# Patient Record
Sex: Female | Born: 1965 | Race: White | Hispanic: No | Marital: Married | Smoking: Former smoker
Health system: Southern US, Community
[De-identification: ages and names within clinical notes are randomized; demographics above are authoritative.]

## PROBLEM LIST (undated history)

## (undated) DIAGNOSIS — N200 Calculus of kidney: Secondary | ICD-10-CM

## (undated) DIAGNOSIS — I10 Essential (primary) hypertension: Secondary | ICD-10-CM

## (undated) DIAGNOSIS — T8859XA Other complications of anesthesia, initial encounter: Secondary | ICD-10-CM

## (undated) DIAGNOSIS — D649 Anemia, unspecified: Secondary | ICD-10-CM

## (undated) DIAGNOSIS — Z87442 Personal history of urinary calculi: Secondary | ICD-10-CM

## (undated) DIAGNOSIS — K602 Anal fissure, unspecified: Secondary | ICD-10-CM

## (undated) DIAGNOSIS — K8020 Calculus of gallbladder without cholecystitis without obstruction: Secondary | ICD-10-CM

## (undated) DIAGNOSIS — K589 Irritable bowel syndrome without diarrhea: Secondary | ICD-10-CM

## (undated) DIAGNOSIS — K219 Gastro-esophageal reflux disease without esophagitis: Secondary | ICD-10-CM

## (undated) HISTORY — DX: Calculus of kidney: N20.0

## (undated) HISTORY — PX: CHOLECYSTECTOMY: SHX55

## (undated) HISTORY — DX: Calculus of gallbladder without cholecystitis without obstruction: K80.20

## (undated) HISTORY — DX: Irritable bowel syndrome without diarrhea: K58.9

## (undated) HISTORY — DX: Morbid (severe) obesity due to excess calories: E66.01

## (undated) HISTORY — DX: Anal fissure, unspecified: K60.2

## (undated) HISTORY — PX: TUBAL LIGATION: SHX77

---

## 1986-06-08 HISTORY — PX: CHOLECYSTECTOMY: SHX55

## 1988-06-08 HISTORY — PX: TUBAL LIGATION: SHX77

## 2000-10-13 ENCOUNTER — Emergency Department (HOSPITAL_COMMUNITY): Admission: EM | Admit: 2000-10-13 | Discharge: 2000-10-13 | Payer: Self-pay

## 2001-01-05 ENCOUNTER — Emergency Department (HOSPITAL_COMMUNITY): Admission: EM | Admit: 2001-01-05 | Discharge: 2001-01-05 | Payer: Self-pay | Admitting: Emergency Medicine

## 2001-01-05 ENCOUNTER — Encounter: Payer: Self-pay | Admitting: Emergency Medicine

## 2001-01-06 ENCOUNTER — Encounter: Payer: Self-pay | Admitting: Emergency Medicine

## 2001-01-06 ENCOUNTER — Emergency Department (HOSPITAL_COMMUNITY): Admission: EM | Admit: 2001-01-06 | Discharge: 2001-01-06 | Payer: Self-pay | Admitting: Emergency Medicine

## 2001-01-18 ENCOUNTER — Encounter: Admission: RE | Admit: 2001-01-18 | Discharge: 2001-04-18 | Payer: Self-pay | Admitting: Family Medicine

## 2001-02-06 ENCOUNTER — Encounter: Payer: Self-pay | Admitting: Family Medicine

## 2001-02-22 ENCOUNTER — Other Ambulatory Visit: Admission: RE | Admit: 2001-02-22 | Discharge: 2001-02-22 | Payer: Self-pay | Admitting: Family Medicine

## 2001-05-29 ENCOUNTER — Emergency Department (HOSPITAL_COMMUNITY): Admission: EM | Admit: 2001-05-29 | Discharge: 2001-05-29 | Payer: Self-pay | Admitting: Emergency Medicine

## 2004-02-07 ENCOUNTER — Encounter: Payer: Self-pay | Admitting: Family Medicine

## 2004-02-07 LAB — CONVERTED CEMR LAB: Pap Smear: NORMAL

## 2004-02-20 ENCOUNTER — Other Ambulatory Visit: Admission: RE | Admit: 2004-02-20 | Discharge: 2004-02-20 | Payer: Self-pay | Admitting: Family Medicine

## 2004-10-08 ENCOUNTER — Ambulatory Visit: Payer: Self-pay | Admitting: Family Medicine

## 2005-06-17 ENCOUNTER — Ambulatory Visit: Payer: Self-pay | Admitting: Family Medicine

## 2005-10-30 ENCOUNTER — Ambulatory Visit: Payer: Self-pay | Admitting: Family Medicine

## 2006-09-16 ENCOUNTER — Ambulatory Visit: Payer: Self-pay | Admitting: Family Medicine

## 2006-09-30 ENCOUNTER — Ambulatory Visit: Payer: Self-pay | Admitting: Family Medicine

## 2006-10-11 ENCOUNTER — Telehealth (INDEPENDENT_AMBULATORY_CARE_PROVIDER_SITE_OTHER): Payer: Self-pay | Admitting: *Deleted

## 2006-10-27 ENCOUNTER — Encounter: Payer: Self-pay | Admitting: Family Medicine

## 2006-10-27 DIAGNOSIS — K589 Irritable bowel syndrome without diarrhea: Secondary | ICD-10-CM

## 2006-10-27 DIAGNOSIS — E785 Hyperlipidemia, unspecified: Secondary | ICD-10-CM | POA: Insufficient documentation

## 2006-10-27 DIAGNOSIS — I10 Essential (primary) hypertension: Secondary | ICD-10-CM

## 2006-10-27 DIAGNOSIS — K219 Gastro-esophageal reflux disease without esophagitis: Secondary | ICD-10-CM

## 2006-10-27 DIAGNOSIS — E669 Obesity, unspecified: Secondary | ICD-10-CM

## 2006-10-29 ENCOUNTER — Other Ambulatory Visit: Admission: RE | Admit: 2006-10-29 | Discharge: 2006-10-29 | Payer: Self-pay | Admitting: Family Medicine

## 2006-10-29 ENCOUNTER — Encounter: Payer: Self-pay | Admitting: Family Medicine

## 2006-10-29 ENCOUNTER — Ambulatory Visit: Payer: Self-pay | Admitting: Family Medicine

## 2006-10-29 DIAGNOSIS — N939 Abnormal uterine and vaginal bleeding, unspecified: Secondary | ICD-10-CM

## 2006-10-29 LAB — CONVERTED CEMR LAB
AST: 25 units/L (ref 0–37)
Albumin: 3.9 g/dL (ref 3.5–5.2)
Basophils Absolute: 0 10*3/uL (ref 0.0–0.1)
Chloride: 109 meq/L (ref 96–112)
Cholesterol: 168 mg/dL (ref 0–200)
Eosinophils Absolute: 0.2 10*3/uL (ref 0.0–0.6)
GFR calc Af Amer: 120 mL/min
GFR calc non Af Amer: 99 mL/min
HCT: 35.3 % — ABNORMAL LOW (ref 36.0–46.0)
MCHC: 35.5 g/dL (ref 30.0–36.0)
MCV: 93 fL (ref 78.0–100.0)
Neutrophils Relative %: 56.9 % (ref 43.0–77.0)
Platelets: 251 10*3/uL (ref 150–400)
RBC: 3.8 M/uL — ABNORMAL LOW (ref 3.87–5.11)
Sodium: 141 meq/L (ref 135–145)
Total CHOL/HDL Ratio: 4.3
Triglycerides: 101 mg/dL (ref 0–149)

## 2006-11-03 ENCOUNTER — Encounter (INDEPENDENT_AMBULATORY_CARE_PROVIDER_SITE_OTHER): Payer: Self-pay | Admitting: *Deleted

## 2006-11-19 ENCOUNTER — Encounter: Admission: RE | Admit: 2006-11-19 | Discharge: 2006-11-19 | Payer: Self-pay | Admitting: Family Medicine

## 2006-11-19 IMAGING — US US TRANSVAGINAL NON-OB
1 series · 14 of 25 positions shown · non-contrast
Comparison: none

CLINICAL DATA: Menorrhagia.  Severe dysmenorrhea. 
 TRANSABDOMINAL AND TRANSVAGINAL PELVIC ULTRASOUND:
TECHNIQUE: Both transabdominal and transvaginal ultrasound examinations of the pelvis were performed including evaluation of the uterus, ovaries, adnexal regions, and pelvic cul-de-sac.

[Series 1: us transvaginal non-ob · 0.35mm/px · 14 of 72 slices shown]
[im 1/72]
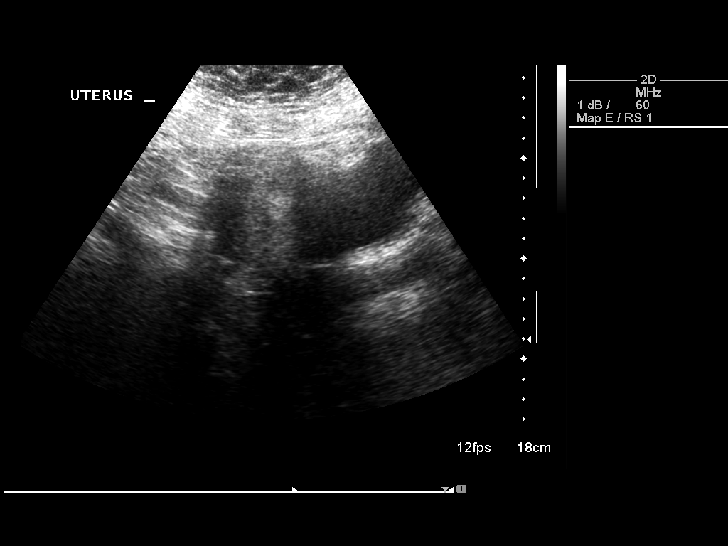
[im 6/72]
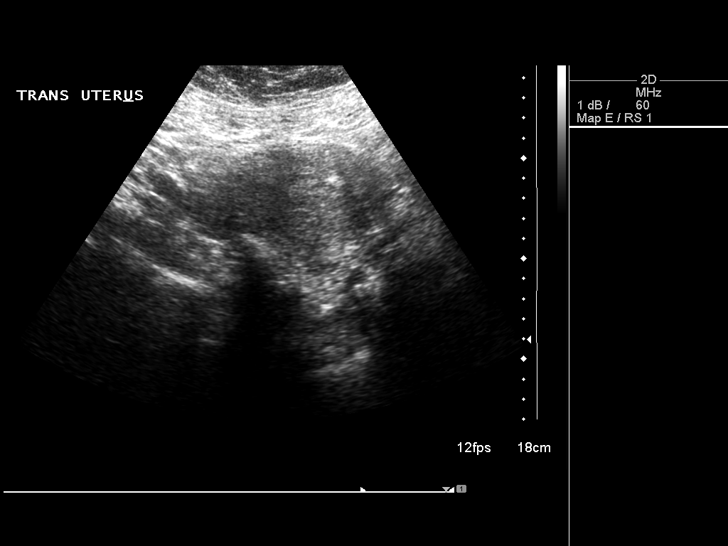
[im 12/72]
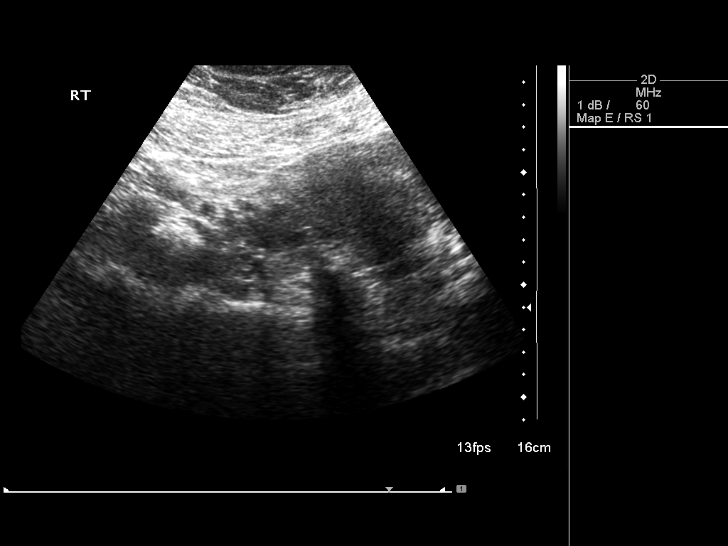
[im 18/72]
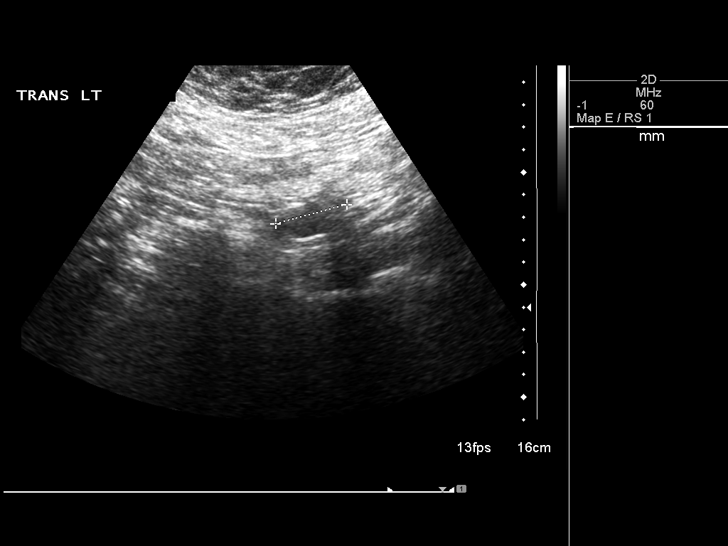
[im 24/72]
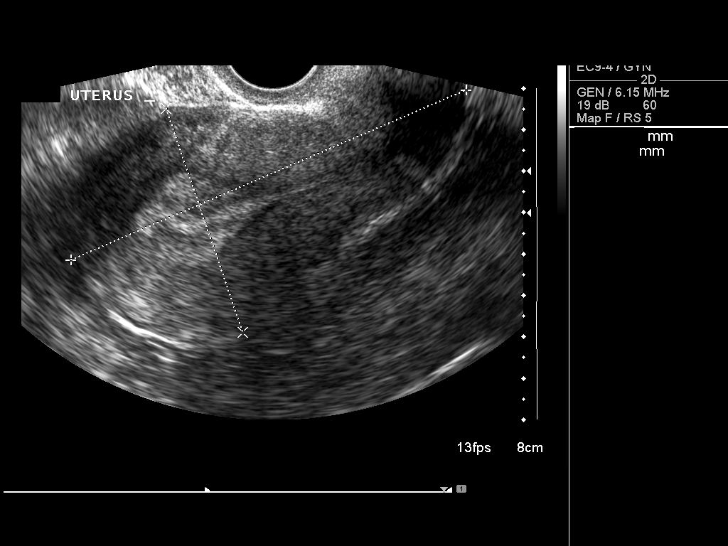
[im 27/72]
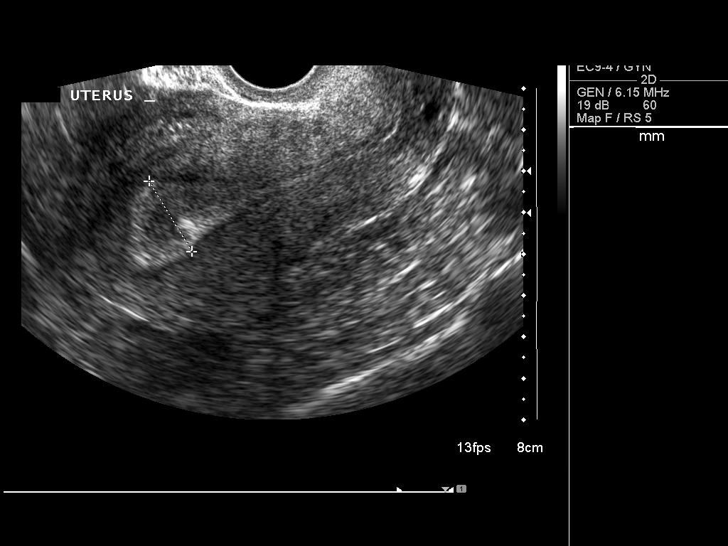
[im 33/72]
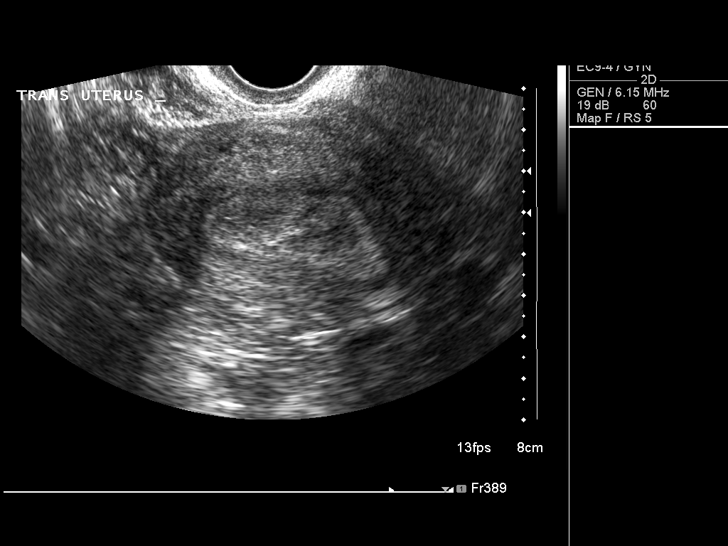
[im 39/72]
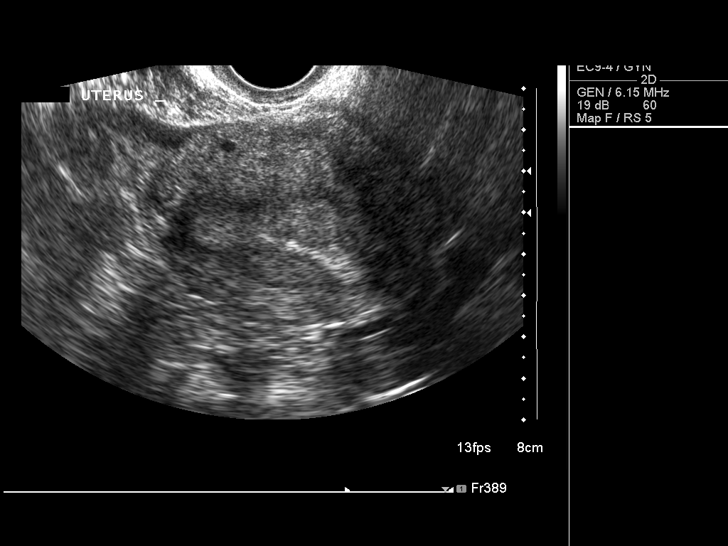
[im 45/72]
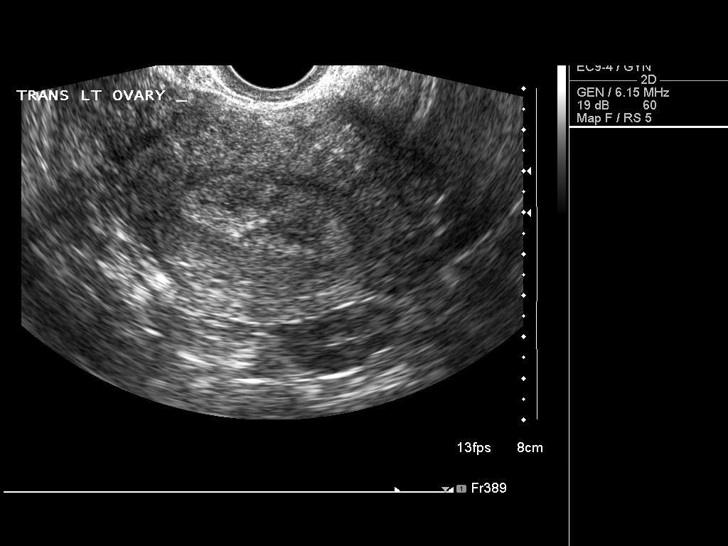
[im 48/72]
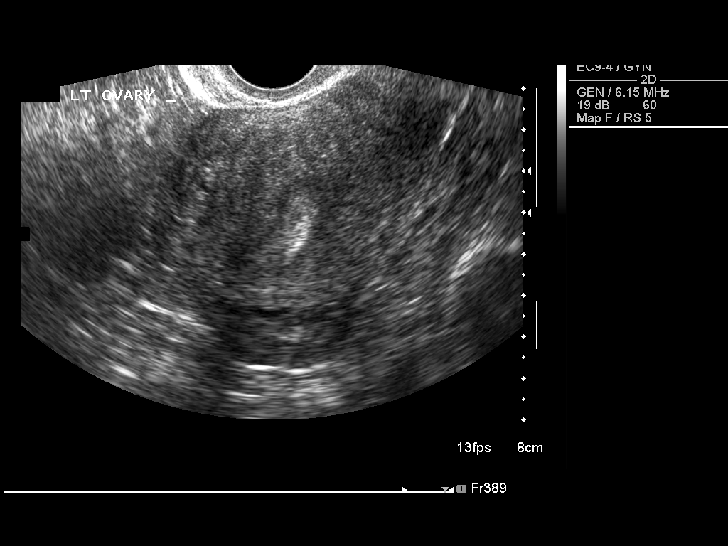
[im 54/72]
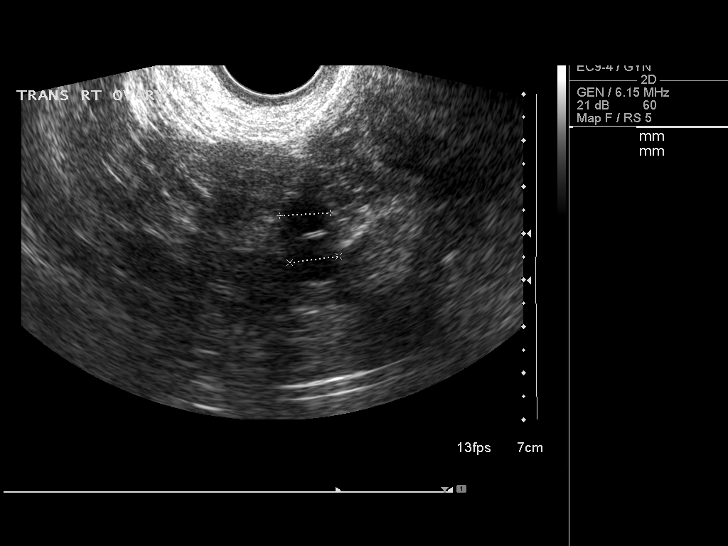
[im 60/72]
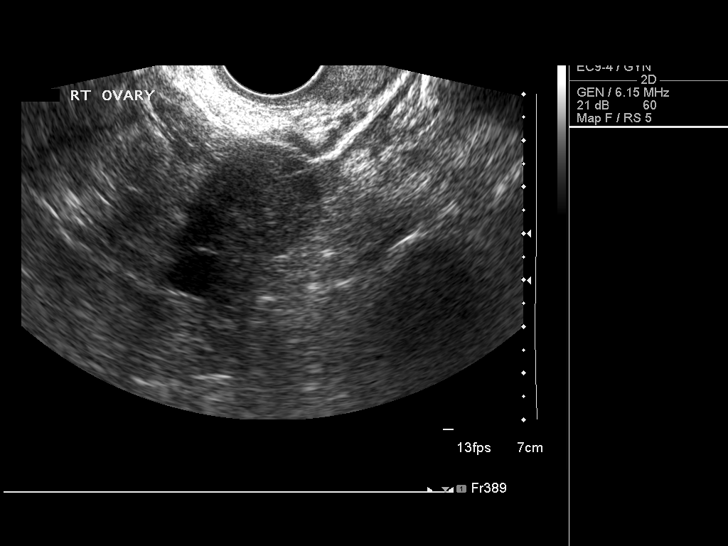
[im 66/72]
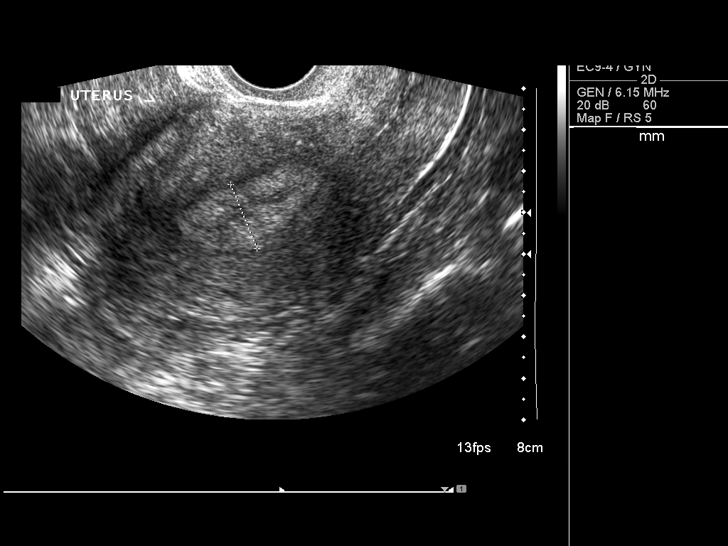
[im 72/72]
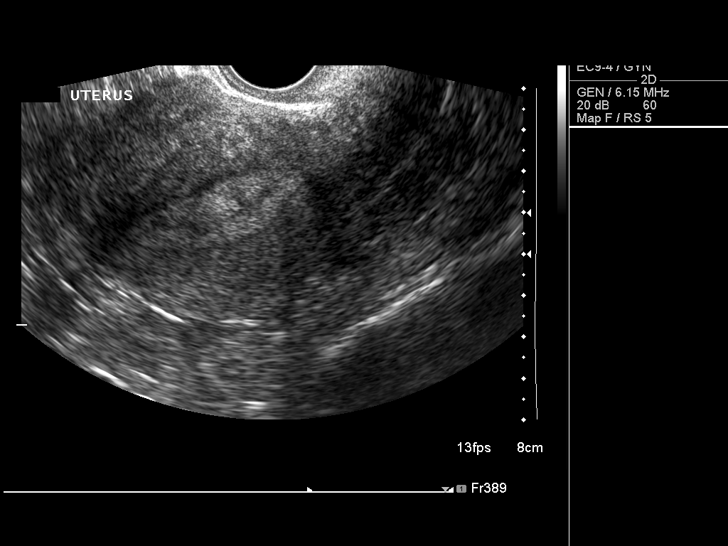

[14 of 25 positions shown; findings below may reference images not displayed]

FINDINGS: The uterus measures 10.4 cm sagittally with a depth of 5.7 cm and width of 7.4 m.  Small Nabothian cysts are present.  The endometrium is thickened measuring 19 mm.  Up to 16 mm is generally considered within normal limits.  The ovaries are normal in size.  Two small cysts are noted on the right of less than or equal to 11 mm.  No free fluid is seen.
IMPRESSION: Somewhat thickened endometrium of 19 mm with 16 mm being upper limits of normal.  No other significant abnormality is seen.

## 2007-07-25 ENCOUNTER — Emergency Department (HOSPITAL_COMMUNITY): Admission: EM | Admit: 2007-07-25 | Discharge: 2007-07-25 | Payer: Self-pay | Admitting: Emergency Medicine
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
## 2007-08-05 ENCOUNTER — Ambulatory Visit: Payer: Self-pay | Admitting: Family Medicine

## 2007-08-05 ENCOUNTER — Telehealth (INDEPENDENT_AMBULATORY_CARE_PROVIDER_SITE_OTHER): Payer: Self-pay | Admitting: *Deleted

## 2007-08-05 LAB — CONVERTED CEMR LAB
Albumin: 3.7 g/dL (ref 3.5–5.2)
BUN: 9 mg/dL (ref 6–23)
Calcium: 9 mg/dL (ref 8.4–10.5)
Chloride: 106 meq/L (ref 96–112)
Creatinine, Ser: 0.7 mg/dL (ref 0.4–1.2)
GFR calc non Af Amer: 99 mL/min
Sodium: 139 meq/L (ref 135–145)

## 2007-08-12 ENCOUNTER — Telehealth: Payer: Self-pay | Admitting: Family Medicine

## 2007-08-22 ENCOUNTER — Ambulatory Visit: Payer: Self-pay | Admitting: Family Medicine

## 2007-09-01 ENCOUNTER — Telehealth: Payer: Self-pay | Admitting: Family Medicine

## 2007-11-08 ENCOUNTER — Telehealth: Payer: Self-pay | Admitting: Family Medicine

## 2008-06-22 ENCOUNTER — Ambulatory Visit: Payer: Self-pay | Admitting: Family Medicine

## 2008-06-22 LAB — CONVERTED CEMR LAB: Rapid Strep: NEGATIVE

## 2010-01-10 ENCOUNTER — Telehealth: Payer: Self-pay | Admitting: Family Medicine

## 2010-01-22 ENCOUNTER — Ambulatory Visit: Payer: Self-pay | Admitting: Family Medicine

## 2010-01-24 LAB — CONVERTED CEMR LAB
ALT: 19 units/L (ref 0–35)
AST: 20 units/L (ref 0–37)
Alkaline Phosphatase: 44 units/L (ref 39–117)
Basophils Absolute: 0 10*3/uL (ref 0.0–0.1)
Basophils Relative: 0.7 % (ref 0.0–3.0)
Bilirubin, Direct: 0.1 mg/dL (ref 0.0–0.3)
Chloride: 106 meq/L (ref 96–112)
Eosinophils Absolute: 0.2 10*3/uL (ref 0.0–0.7)
FSH: 7.4 milliintl units/mL
GFR calc non Af Amer: 100.37 mL/min (ref >60)
Hemoglobin: 11.5 g/dL — ABNORMAL LOW (ref 12.0–15.0)
LDL Cholesterol: 89 mg/dL (ref 0–99)
LH: 7.73 milliintl units/mL
Lymphocytes Relative: 25.9 % (ref 12.0–46.0)
Monocytes Relative: 10.7 % (ref 3.0–12.0)
Neutro Abs: 3.3 10*3/uL (ref 1.4–7.7)
Neutrophils Relative %: 59.4 % (ref 43.0–77.0)
Potassium: 4.2 meq/L (ref 3.5–5.1)
RBC: 3.65 M/uL — ABNORMAL LOW (ref 3.87–5.11)
Sodium: 142 meq/L (ref 135–145)
Total Bilirubin: 0.6 mg/dL (ref 0.3–1.2)
Total CHOL/HDL Ratio: 4
VLDL: 30.4 mg/dL (ref 0.0–40.0)

## 2010-04-14 ENCOUNTER — Ambulatory Visit: Payer: Self-pay | Admitting: Family Medicine

## 2010-05-07 ENCOUNTER — Ambulatory Visit: Payer: Self-pay | Admitting: Family Medicine

## 2010-05-07 DIAGNOSIS — G2581 Restless legs syndrome: Secondary | ICD-10-CM | POA: Insufficient documentation

## 2010-05-09 LAB — CONVERTED CEMR LAB
ALT: 21 units/L (ref 0–35)
AST: 22 units/L (ref 0–37)
Albumin: 3.9 g/dL (ref 3.5–5.2)
Alkaline Phosphatase: 48 units/L (ref 39–117)
Basophils Relative: 0.4 % (ref 0.0–3.0)
Bilirubin, Direct: 0.1 mg/dL (ref 0.0–0.3)
CO2: 25 meq/L (ref 19–32)
Calcium: 8.9 mg/dL (ref 8.4–10.5)
Creatinine, Ser: 0.7 mg/dL (ref 0.4–1.2)
Eosinophils Absolute: 0.2 10*3/uL (ref 0.0–0.7)
Eosinophils Relative: 2.9 % (ref 0.0–5.0)
Folate: 7.4 ng/mL
Hemoglobin: 10.7 g/dL — ABNORMAL LOW (ref 12.0–15.0)
Lymphocytes Relative: 25.6 % (ref 12.0–46.0)
MCHC: 33.5 g/dL (ref 30.0–36.0)
Monocytes Relative: 7.4 % (ref 3.0–12.0)
Neutro Abs: 4.2 10*3/uL (ref 1.4–7.7)
Neutrophils Relative %: 63.7 % (ref 43.0–77.0)
RBC: 3.97 M/uL (ref 3.87–5.11)
Sodium: 140 meq/L (ref 135–145)
Total Protein: 7.1 g/dL (ref 6.0–8.3)
WBC: 6.6 10*3/uL (ref 4.5–10.5)

## 2010-05-15 ENCOUNTER — Ambulatory Visit: Payer: Self-pay | Admitting: Family Medicine

## 2010-05-23 LAB — CONVERTED CEMR LAB: Total CK: 253 units/L — ABNORMAL HIGH (ref 7–177)

## 2010-07-08 NOTE — Progress Notes (Signed)
Summary: Ear pain  Phone Note Call from Patient Call back at Home Phone [phone]   Caller: Patient Call For: Kerby Nora MD Summary of Call: Patient is having left ear pain, down into her hear cannal.  She says that it also hurts in her mouth in the very back where there are no teeth at the same time she is experiencing the ear pain.  Pain has been off and on for about two weeks now.  She has not been swimming or has done anything that would contribute to the ear pain.  Offered her an appt with Dr. Patsy Lager on 01/13/2010 at 3:45, she will call back later today to make an appt for Saturday clinic if she works out her schedule to be seen then, if not she will keep appt on Monday.  Advised her to go to Urgent Care over the weekend if pain worsened or symptoms worsened. Initial call taken by: Linde Gillis CMA Duncan Dull),  January 10, 2010 8:53 AM

## 2010-07-08 NOTE — Assessment & Plan Note (Signed)
Summary: BP MEDS/DLO   Vital Signs:  Patient profile:   45 year old female Height:      66 inches Weight:      238.0 pounds BMI:     38.55 Temp:     98.0 degrees F oral Pulse rate:   96 / minute Pulse rhythm:   regular BP sitting:   110 / 70  (left arm) Cuff size:   large  Vitals Entered By: Benny Lennert CMA Duncan Dull) (January 22, 2010 9:33 AM)  History of Present Illness: Chief complaint BP meds  HTN, well controlled on spirnolactone daily Not checking at home.  GERD, poor control off omeprazole.  Abdominal bloating.   Hot flashes in last year, increasing.  Heany menstrual bleeding... occuring monthly. Last 5-7 days.  No dizziness.   Unclear when mother/sisters went through menopause.  Problems Prior to Update: 1)  Acut Suppratv Otitis Media w/o Spont Rup Eardrum  (ICD-382.00) 2)  Ear Pain, Right  (ICD-388.70) 3)  Excessive Menstruation  (ICD-626.2) 4)  Gynecological Examination, Routine  (ICD-V72.31) 5)  Examination, Routine Medical  (ICD-V70.0) 6)  Obesity  (ICD-278.00) 7)  Hypertension  (ICD-401.9) 8)  Ibs  (ICD-564.1) 9)  Hyperlipidemia  (ICD-272.4) 10)  Gerd  (ICD-530.81) 11)  Helicobacter Pylori Gastritis, Treated  (ICD-041.86)  Current Medications (verified): 1)  Spironolactone 25 Mg Tabs (Spironolactone) .... Take 1 Tablet By Mouth Twice A Day 2)  Omeprazole 40 Mg Cpdr (Omeprazole) .... Take 1 Tablet By Mouth Once A Day  Allergies: 1)  Ibuprofen (Ibuprofen)  Past History:  Past medical, surgical, family and social histories (including risk factors) reviewed, and no changes noted (except as noted below).  Past Surgical History: Reviewed history from 10/27/2006 and no changes required. 02/22/01  MRI lumbar spine - multilevel spondylosis, (-) stenosis 12/19/02  Abdominal U/S, S/P cholecystectomy, increased hepatic echodensity 02/27/03  Stress cardiolite WNL EF 61%  Family History: Reviewed history from 10/27/2006 and no changes required. Father: Died  42's Aneurysm (rupture), CAD, Renal disease, HTN Mother: Died 40's Massive stroke, carotid  (L) Siblings: 1 brother, 2 sisters with epilepsy CV:  (+) Father, MGF with MI HBP:  (+) Father DM:  (-) Ovarian/Uterine CA:  sister Stroke:  (+) Mother  Social History: Reviewed history from 10/27/2006 and no changes required. Former Smoker Alcohol use-no Marital Status: RE-Married, lives with husband Children: 2 (18,16 boys) Stepdaughter since 22 years old, now 51 and out of house Occupation: Financial trader, former bus driver  Review of Systems General:  Denies fatigue. CV:  Denies chest pain or discomfort. Resp:  Denies shortness of breath. GI:  Denies abdominal pain. GU:  Complains of abnormal vaginal bleeding; denies discharge and dysuria.  Physical Exam  General:  overweight appearing female IN NAD Mouth:  Oral mucosa and oropharynx without lesions or exudates.  Teeth in good repair. Neck:  no carotid bruit or thyromegaly no cervical or supraclavicular lymphadenopathy  Lungs:  Normal respiratory effort, chest expands symmetrically. Lungs are clear to auscultation, no crackles or wheezes. Heart:  Normal rate and regular rhythm. S1 and S2 normal without gallop, murmur, click, rub or other extra sounds. Abdomen:  Bowel sounds positive,abdomen soft and non-tender without masses, organomegaly or hernias noted. Pulses:  R and L posterior tibial pulses are full and equal bilaterally  Extremities:  no edema  Skin:  Intact without suspicious lesions or rashes   Impression & Recommendations:  Problem # 1:  HYPERTENSION (ICD-401.9) Well controlled. Continue current medication.  Her updated medication list  for this problem includes:    Spironolactone 25 Mg Tabs (Spironolactone) .Marland Kitchen... Take 1 tablet by mouth twice a day  Orders: TLB-BMP (Basic Metabolic Panel-BMET) (80048-METABOL) TLB-Hepatic/Liver Function Pnl (80076-HEPATIC) TLB-Lipid Panel (80061-LIPID)  Problem # 2:  HYPERLIPIDEMIA  (ICD-272.4) Due for reeval.   Problem # 3:  EXCESSIVE MENSTRUATION (ICD-626.2) Eval with labs...due for pelvic exam.  [place] in 2008 shoed only thickening of uterine wall, mild. MAy nee GYN referral.  Orders: TLB-CBC Platelet - w/Differential (85025-CBCD) TLB-TSH (Thyroid Stimulating Hormone) (84443-TSH) TLB-FSH (Follicle Stimulating Hormone) (83001-FSH) TLB-Luteinizing Hormone (LH) (83002-LH)  Problem # 4:  GERD (ICD-530.81)  Her updated medication list for this problem includes:    Omeprazole 40 Mg Cpdr (Omeprazole) .Marland Kitchen... Take 1 tablet by mouth once a day  Complete Medication List: 1)  Spironolactone 25 Mg Tabs (Spironolactone) .... Take 1 tablet by mouth twice a day 2)  Omeprazole 40 Mg Cpdr (Omeprazole) .... Take 1 tablet by mouth once a day  Patient Instructions: 1)  Schedule CPx/pap in next few months. Prescriptions: OMEPRAZOLE 40 MG CPDR (OMEPRAZOLE) Take 1 tablet by mouth once a day  #30 x 11   Entered and Authorized by:   Kerby Nora MD   Signed by:   Kerby Nora MD on 01/22/2010   Method used:   Electronically to        [facility] [address] (retail)       [address], [address]       [place], [place]  [address]       Ph: [phone]       Fax: [phone]   RxID:   [id number] SPIRONOLACTONE 25 MG TABS (SPIRONOLACTONE) Take 1 tablet by mouth twice a day Brand medically necessary #60 x 11   Entered and Authorized by:   Kerby Nora MD   Signed by:   Kerby Nora MD on 01/22/2010   Method used:   Electronically to        [facility] [address] (retail)       [address], [address]       [place], [place]  [address]       Ph: [phone]       Fax: [phone]   RxID:   [id number]   Current Allergies (reviewed today): IBUPROFEN (IBUPROFEN)   Past Surgical History:    Reviewed history from 10/27/2006 and no changes required:       02/22/01  MRI lumbar spine - multilevel spondylosis, (-)  stenosis       12/19/02  Abdominal U/S, S/P cholecystectomy, increased hepatic echodensity       02/27/03  Stress cardiolite WNL EF 61%

## 2010-07-08 NOTE — Assessment & Plan Note (Signed)
Summary: MOUTH,ST/CLE   Vital Signs:  Patient profile:   45 year old female Height:      66 inches Weight:      244 pounds BMI:     39.52 Temp:     99.2 degrees F oral Pulse rate:   84 / minute Pulse rhythm:   regular BP sitting:   122 / 74  (left arm) Cuff size:   large  Vitals Entered By: Delilah Shan CMA Duncan Dull) (April 14, 2010 3:58 PM) CC: Mouth, ST, sinus drainage   Chief Complaint:  Mouth, ST, and sinus drainage.  History of Present Illness: Ear and sinus congestion. Throat isn't sore, but is "irritated."  No pain with swallowing.  Phlegm in throat.  Taking OTC allergy meds with some help.  Clear rhinorrhea during the day, more congested at night.  going on for  ~1 week.  No fevers.  No vomiting.  Minimal cough but some throat clearing.  Husband has been sick.  Pt thought she has some small clear blisters on the soft palate previously  Allergies: 1)  Ibuprofen (Ibuprofen)  Social History: Former Smoker Alcohol use-no Marital Status: RE-Married, lives with husband Children: 2 (18,16 boys) Stepdaughter since 63 years old, now 39 and out of house Occupation: Financial trader, former bus driver  Review of Systems       See HPI.  Otherwise negative.    Physical Exam  General:  GEN: nad, alert and oriented HEENT: mucous membranes moist, TM w/o erythema, nasal epithelium injected with clear rhinorrhea, OP with cobblestoning and soft palate with resolving lesions- small clean based and w/o exudates NECK: supple w/o LA CV: rrr. PULM: ctab, no inc wob ABD: soft, +bs EXT: no edema    Impression & Recommendations:  Problem # 1:  UPPER RESPIRATORY INFECTION (ICD-465.9) Likely viral process.  No indication of strep.  Nontoxic and supporitive tx.  should resolve gradually, follow up as needed.  Her updated medication list for this problem includes:    Allegra Allergy 180 Mg Tabs (Fexofenadine hcl) .Marland Kitchen... Take 1 tablet by mouth once a day as needed  Complete Medication  List: 1)  Spironolactone 25 Mg Tabs (Spironolactone) .... Take 1 tablet by mouth twice a day 2)  Omeprazole 40 Mg Cpdr (Omeprazole) .... Take 1 tablet by mouth once a day 3)  Allegra Allergy 180 Mg Tabs (Fexofenadine hcl) .... Take 1 tablet by mouth once a day as needed  Patient Instructions: 1)  Get plenty of rest, drink lots of clear liquids, and use Tylenol for fever and comfort.  You can gargle with salt water for your throat and use nasal saline for your congestion.  This should gradually get better.  Take care.    Orders Added: 1)  Est. Patient Level III [[address]]    Current Allergies (reviewed today): IBUPROFEN (IBUPROFEN)

## 2010-07-08 NOTE — Assessment & Plan Note (Signed)
Summary: PAIN IN GROIN   Vital Signs:  Patient profile:   45 year old female Height:      66 inches Weight:      242.4 pounds BMI:     39.27 Temp:     98.0 degrees F oral Pulse rate:   80 / minute Pulse rhythm:   regular BP sitting:   110 / 62  (left arm) Cuff size:   large  Vitals Entered By: Michele Hall) (May 07, 2010 8:14 AM)  History of Present Illness: Chief complaint pain in groin   45 year old female presents with B leg pain in last year..progressively worsening. Entire leg..worse with period. Painoccurs worst at night... feels like she has to move legs to make them comfortable. Better during the day when she is on feet alot. Does note creepy crawly sensation on inside of legs.  Husband has noted leg jerking or twitching.  Ibuprofen 800 mg helps some.  Now in past  week she began having right groin pain.  Trying to stretch  with minimal reflief.   Also right heel pain.Marland Kitchen intermittant occ gait changes due to that.   Has history of heavy menstration and low Hg... last check 11.5. No ferritin measured.  Likely going through menopause..night sweats.  Not on any statin.  8/11.TSH: 1.87 (01/22/2010 10:20:14 AM) nml. Nml CMET.   Problems Prior to Update: 1)  Upper Respiratory Infection  (ICD-465.9) 2)  Excessive Menstruation  (ICD-626.2) 3)  Gynecological Examination, Routine  (ICD-V72.31) 4)  Examination, Routine Medical  (ICD-V70.0) 5)  Obesity  (ICD-278.00) 6)  Hypertension  (ICD-401.9) 7)  Ibs  (ICD-564.1) 8)  Hyperlipidemia  (ICD-272.4) 9)  Gerd  (ICD-530.81) 10)  Helicobacter Pylori Gastritis, Treated  (ICD-041.86)  Current Medications (verified): 1)  Spironolactone 25 Mg Tabs (Spironolactone) .... Take 1 Tablet By Mouth Twice A Day 2)  Omeprazole 40 Mg Cpdr (Omeprazole) .... Take 1 Tablet By Mouth Once A Day 3)  Allegra Allergy 180 Mg Tabs (Fexofenadine Hcl) .... Take 1 Tablet By Mouth Once A Day As Needed  Allergies: 1)  Ibuprofen  (Ibuprofen)  Past History:  Past medical, surgical, family and social histories (including risk factors) reviewed, and no changes noted (except as noted below).  Past Surgical History: Reviewed history from 10/27/2006 and no changes required. 02/22/01  MRI lumbar spine - multilevel spondylosis, (-) stenosis 12/19/02  Abdominal U/S, S/P cholecystectomy, increased hepatic echodensity 02/27/03  Stress cardiolite WNL EF 61%  Family History: Reviewed history from 10/27/2006 and no changes required. Father: Died 17's Aneurysm (rupture), CAD, Renal disease, HTN Mother: Died 57's Massive stroke, carotid  (L) Siblings: 1 brother, 2 sisters with epilepsy CV:  (+) Father, MGF with MI HBP:  (+) Father DM:  (-) Ovarian/Uterine CA:  sister Stroke:  (+) Mother  Social History: Reviewed history from 04/14/2010 and no changes required. Former Smoker Alcohol use-no Marital Status: RE-Married, lives with husband Children: 2 (18,16 boys) Stepdaughter since 5 years old, now 34 and out of house Occupation: Financial trader, former bus driver  Review of Systems General:  Denies fatigue and fever. CV:  Denies chest pain or discomfort. Resp:  Denies shortness of breath. GI:  Denies abdominal pain. Derm:  Denies rash. Neuro:  Denies falling down and headaches; no dizziness.  Physical Exam  General:  obese appearng female in NAD  Mouth:  Oral mucosa and oropharynx without lesions or exudates.  Teeth in good repair. Neck:  no carotid bruit or thyromegaly no cervical  or supraclavicular lymphadenopathy  Lungs:  Normal respiratory effort, chest expands symmetrically. Lungs are clear to auscultation, no crackles or wheezes. Heart:  Normal rate and regular rhythm. S1 and S2 normal without gallop, murmur, click, rub or other extra sounds. Abdomen:  Bowel sounds positive,abdomen soft and non-tender without masses, organomegaly or hernias noted. Msk:  no vertebral ttp, no gluteal pain... mild right lateral  thigh pain with Pearlean Brownie, ..no groin pain induced.  Groin pain only present on palpation over obdurator muscle right. Pulses:  R and L posterior tibial pulses are full and equal bilaterally  Extremities:  no edema  Neurologic:  No cranial nerve deficits noted. Station and gait are normal. Plantar reflexes are down-going bilaterally. DTRs are symmetrical throughout. Sensory, motor and coordinative functions appear intact. Skin:  Intact without suspicious lesions or rashes   Impression & Recommendations:  Problem # 1:  RESTLESS LEG SYNDROME (ICD-333.94) Eval fo secondary causes. Use NSAIds as needed now. IF labs negative...consider use of ropinirole at bedtime.  Orders: TLB-BMP (Basic Metabolic Panel-BMET) (80048-METABOL) TLB-CBC Platelet - w/Differential (85025-CBCD) TLB-Hepatic/Liver Function Pnl (80076-HEPATIC) TLB-Ferritin (82728-FER) TLB-B12 + Folate Pnl (82746_82607-B12/FOL) T-Vitamin D (25-Hydroxy) ([address])  Problem # 2:  MYALGIA (ICD-729.1)  Orders: TLB-CK Total Only(Creatine Kinase/CPK) (82550-CK)  Problem # 3:  GROIN STRAIN, RIGHT (ICD-848.8) NSAIDs, ICE , stretching. Follow up if ot improving as expected.   Complete Medication List: 1)  Spironolactone 25 Mg Tabs (Spironolactone) .... Take 1 tablet by mouth twice a day 2)  Omeprazole 40 Mg Cpdr (Omeprazole) .... Take 1 tablet by mouth once a day 3)  Allegra Allergy 180 Mg Tabs (Fexofenadine hcl) .... Take 1 tablet by mouth once a day as needed  Patient Instructions: 1)  Ibuprofen 800 mg every 8 hours as needed pain...take with food. 2)   We will call with results of labs... for further treatment of restless leg. 3)  Increase water intake...64 oun ze of water a day.    Orders Added: 1)  TLB-BMP (Basic Metabolic Panel-BMET) [80048-METABOL] 2)  TLB-CBC Platelet - w/Differential [85025-CBCD] 3)  TLB-Hepatic/Liver Function Pnl [80076-HEPATIC] 4)  TLB-Ferritin [82728-FER] 5)  TLB-B12 + Folate Pnl  [82746_82607-B12/FOL] 6)  T-Vitamin D (25-Hydroxy) [09811-91478] 7)  TLB-CK Total Only(Creatine Kinase/CPK) [82550-CK] 8)  Est. Patient Level IV [[address]]    Current Allergies (reviewed today): IBUPROFEN (IBUPROFEN)  Appended Document: PAIN IN GROIN     Clinical Lists Changes  Orders: Added new Service order of Specimen Handling (13086) - Signed

## 2010-07-18 ENCOUNTER — Encounter: Payer: Self-pay | Admitting: Family Medicine

## 2010-07-18 ENCOUNTER — Ambulatory Visit (INDEPENDENT_AMBULATORY_CARE_PROVIDER_SITE_OTHER): Payer: BC Managed Care – PPO | Admitting: Family Medicine

## 2010-07-18 DIAGNOSIS — E538 Deficiency of other specified B group vitamins: Secondary | ICD-10-CM

## 2010-07-18 DIAGNOSIS — I10 Essential (primary) hypertension: Secondary | ICD-10-CM

## 2010-07-18 DIAGNOSIS — D509 Iron deficiency anemia, unspecified: Secondary | ICD-10-CM | POA: Insufficient documentation

## 2010-07-18 DIAGNOSIS — E785 Hyperlipidemia, unspecified: Secondary | ICD-10-CM

## 2010-07-24 NOTE — Assessment & Plan Note (Signed)
Summary: 6 MTHS F/U JRR   Vital Signs:  Patient profile:   45 year old female Height:      66 inches Weight:      247.50 pounds BMI:     40.09 Temp:     98.4 degrees F oral Pulse rate:   80 / minute Pulse rhythm:   regular BP sitting:   90 / 60  (left arm) Cuff size:   large  Vitals Entered By: Benny Lennert CMA Duncan Dull) (July 18, 2010 8:27 AM)  History of Present Illness: Chief complaint 6 month follow up   Right leg grain strain, leg pain  in 05/2010.Marland Kitchen during eval CK found to elevated in 200s. She is no longer having any problem with this.  She stopped omeprazole due to thinking low B12 and low iron.   HTN, well controlled on  spirnolactone  Low B12... in 04/2010.. has been on B12 since.  Iron deficiency anemia: slow iron  Problems Prior to Update: 1)  Groin Strain, Right  (ICD-848.8) 2)  Myalgia  (ICD-729.1) 3)  Restless Leg Syndrome  (ICD-333.94) 4)  Excessive Menstruation  (ICD-626.2) 5)  Gynecological Examination, Routine  (ICD-V72.31) 6)  Examination, Routine Medical  (ICD-V70.0) 7)  Obesity  (ICD-278.00) 8)  Hypertension  (ICD-401.9) 9)  Ibs  (ICD-564.1) 10)  Hyperlipidemia  (ICD-272.4) 11)  Gerd  (ICD-530.81) 12)  Helicobacter Pylori Gastritis, Treated  (ICD-041.86)  Current Medications (verified): 1)  Spironolactone 25 Mg Tabs (Spironolactone) .... Take 1 Tablet By Mouth Twice A Day 2)  Omeprazole 40 Mg Cpdr (Omeprazole) .... Take 1 Tablet By Mouth Once A Day 3)  Allegra Allergy 180 Mg Tabs (Fexofenadine Hcl) .... Take 1 Tablet By Mouth Once A Day As Needed 4)  Vitamin B-12 1000 Mcg Tabs (Cyanocobalamin) .... Take 1 Tablet By Mouth Once A Day 5)  Slow Fe 160 (50 Fe) Mg Cr-Tabs (Ferrous Sulfate Dried) .Marland Kitchen.. 1 Tab By Mouth Daily  Allergies: 1)  Ibuprofen (Ibuprofen)  Past History:  Past medical, surgical, family and social histories (including risk factors) reviewed, and no changes noted (except as noted below).  Past Surgical History: Reviewed  history from 10/27/2006 and no changes required. 02/22/01  MRI lumbar spine - multilevel spondylosis, (-) stenosis 12/19/02  Abdominal U/S, S/P cholecystectomy, increased hepatic echodensity 02/27/03  Stress cardiolite WNL EF 61%  Family History: Reviewed history from 10/27/2006 and no changes required. Father: Died 47's Aneurysm (rupture), CAD, Renal disease, HTN Mother: Died 51's Massive stroke, carotid  (L) Siblings: 1 brother, 2 sisters with epilepsy CV:  (+) Father, MGF with MI HBP:  (+) Father DM:  (-) Ovarian/Uterine CA:  sister Stroke:  (+) Mother  Social History: Reviewed history from 04/14/2010 and no changes required. Former Smoker Alcohol use-no Marital Status: RE-Married, lives with husband Children: 2 (18,16 boys) Stepdaughter since 44 years old, now 22 and out of house Occupation: Financial trader, former bus driver  Review of Systems General:  Denies fatigue and fever. CV:  Denies chest pain or discomfort. Resp:  Denies shortness of breath. GI:  Denies abdominal pain. GU:  Denies dysuria.  Physical Exam  General:  obese appearng female in NAD  Mouth:  Oral mucosa and oropharynx without lesions or exudates.  Teeth in good repair. Neck:  no carotid bruit or thyromegaly no cervical or supraclavicular lymphadenopathy  Lungs:  Normal respiratory effort, chest expands symmetrically. Lungs are clear to auscultation, no crackles or wheezes. Heart:  Normal rate and regular rhythm. S1 and S2 normal without  gallop, murmur, click, rub or other extra sounds. Abdomen:  Bowel sounds positive,abdomen soft and non-tender without masses, organomegaly or hernias noted. Msk:  No deformity or scoliosis noted of thoracic or lumbar spine.   Pulses:  R and L posterior tibial pulses are full and equal bilaterally  Extremities:  no edema  Neurologic:  No cranial nerve deficits noted. Station and gait are normal. Plantar reflexes are down-going bilaterally. DTRs are symmetrical throughout.  Sensory, motor and coordinative functions appear intact.   Impression & Recommendations:  Problem # 1:  VITAMIN B12 DEFICIENCY (ICD-266.2) Currently on supplementation.. recheck in 3 months.   Problem # 2:  ANEMIA, IRON DEFICIENCY (ICD-280.9) Due to heavy menses.Marland KitchenMarland KitchenCurrently on supplementation.. recheck in 3 months.   No symptoms of lowering hg. Her updated medication list for this problem includes:    Vitamin B-12 1000 Mcg Tabs (Cyanocobalamin) .Marland Kitchen... Take 1 tablet by mouth once a day    Slow Fe 160 (50 Fe) Mg Cr-tabs (Ferrous sulfate dried) .Marland Kitchen... 1 tab by mouth daily  Problem # 3:  HYPERTENSION (ICD-401.9) Well controlled. Continue current medication.  Her updated medication list for this problem includes:    Spironolactone 25 Mg Tabs (Spironolactone) .Marland Kitchen... Take 1 tablet by mouth twice a day  Problem # 4:  HYPERLIPIDEMIA (ICD-272.4) Reeval in 3 months.   Complete Medication List: 1)  Spironolactone 25 Mg Tabs (Spironolactone) .... Take 1 tablet by mouth twice a day 2)  Omeprazole 40 Mg Cpdr (Omeprazole) .... Take 1 tablet by mouth once a day 3)  Allegra Allergy 180 Mg Tabs (Fexofenadine hcl) .... Take 1 tablet by mouth once a day as needed 4)  Vitamin B-12 1000 Mcg Tabs (Cyanocobalamin) .... Take 1 tablet by mouth once a day 5)  Slow Fe 160 (50 Fe) Mg Cr-tabs (Ferrous sulfate dried) .Marland Kitchen.. 1 tab by mouth daily  Patient Instructions: 1)  Continue B12, restart iron.,  2)   Working on exercise and weight loss. 3)   Schedule CPX in 3 months with fasting lipids, CMET, CK , total iron, cbc, ferritin, transferrin, B12 Dx 280.9, 266.2   Orders Added: 1)  Est. Patient Level IV [09811]    Current Allergies (reviewed today): IBUPROFEN (IBUPROFEN)

## 2010-08-01 ENCOUNTER — Ambulatory Visit: Payer: BC Managed Care – PPO | Admitting: Family Medicine

## 2010-08-07 ENCOUNTER — Ambulatory Visit: Payer: BC Managed Care – PPO | Admitting: Family Medicine

## 2010-09-01 ENCOUNTER — Telehealth: Payer: Self-pay | Admitting: *Deleted

## 2010-09-01 MED ORDER — OSELTAMIVIR PHOSPHATE 75 MG PO CAPS: 75.0000 mg | ORAL_CAPSULE | Freq: Two times a day (BID) | ORAL | Status: DC

## 2010-09-01 MED ORDER — OSELTAMIVIR PHOSPHATE 75 MG PO CAPS: 75.0000 mg | ORAL_CAPSULE | Freq: Two times a day (BID) | ORAL | Status: AC

## 2010-09-01 NOTE — Telephone Encounter (Signed)
i find this an acceptable POC  Call in as above

## 2010-09-01 NOTE — Telephone Encounter (Signed)
Patient says that she has the flu, has been running a fever, having terrible body aches, feels like she was hit by a train. She says that she has been around several people that were diagnosed with the flu in the past couple of weeks and is asking if she can get rx for tamiflu called in. She says that her symptoms started yesterday. Uses [facility].

## 2010-09-01 NOTE — Telephone Encounter (Signed)
Rx sent to pharmacy   

## 2010-10-21 ENCOUNTER — Other Ambulatory Visit: Payer: Self-pay | Admitting: Family Medicine

## 2010-10-21 DIAGNOSIS — E538 Deficiency of other specified B group vitamins: Secondary | ICD-10-CM

## 2010-10-21 DIAGNOSIS — D509 Iron deficiency anemia, unspecified: Secondary | ICD-10-CM

## 2010-10-22 ENCOUNTER — Other Ambulatory Visit: Payer: BC Managed Care – PPO

## 2010-10-24 ENCOUNTER — Encounter: Payer: Self-pay | Admitting: Family Medicine

## 2010-10-25 ENCOUNTER — Ambulatory Visit: Payer: BC Managed Care – PPO | Admitting: Family Medicine

## 2010-10-27 ENCOUNTER — Encounter: Payer: Self-pay | Admitting: Family Medicine

## 2010-10-27 ENCOUNTER — Other Ambulatory Visit (HOSPITAL_COMMUNITY)
Admission: RE | Admit: 2010-10-27 | Discharge: 2010-10-27 | Disposition: A | Discharge status: Home / Self Care | Payer: BC Managed Care – PPO | Source: Ambulatory Visit | Attending: Family Medicine | Admitting: Family Medicine

## 2010-10-27 ENCOUNTER — Ambulatory Visit (INDEPENDENT_AMBULATORY_CARE_PROVIDER_SITE_OTHER): Payer: BC Managed Care – PPO | Admitting: Family Medicine

## 2010-10-27 ENCOUNTER — Other Ambulatory Visit: Payer: BC Managed Care – PPO

## 2010-10-27 ENCOUNTER — Ambulatory Visit (INDEPENDENT_AMBULATORY_CARE_PROVIDER_SITE_OTHER)
Admission: RE | Admit: 2010-10-27 | Discharge: 2010-10-27 | Disposition: A | Discharge status: Home / Self Care | Payer: BC Managed Care – PPO | Source: Ambulatory Visit | Attending: Family Medicine | Admitting: Family Medicine

## 2010-10-27 ENCOUNTER — Telehealth: Payer: Self-pay | Admitting: *Deleted

## 2010-10-27 DIAGNOSIS — D509 Iron deficiency anemia, unspecified: Secondary | ICD-10-CM

## 2010-10-27 DIAGNOSIS — R109 Unspecified abdominal pain: Secondary | ICD-10-CM

## 2010-10-27 DIAGNOSIS — Z01419 Encounter for gynecological examination (general) (routine) without abnormal findings: Secondary | ICD-10-CM | POA: Insufficient documentation

## 2010-10-27 DIAGNOSIS — R10A1 Flank pain, right side: Secondary | ICD-10-CM | POA: Insufficient documentation

## 2010-10-27 DIAGNOSIS — E785 Hyperlipidemia, unspecified: Secondary | ICD-10-CM

## 2010-10-27 DIAGNOSIS — I10 Essential (primary) hypertension: Secondary | ICD-10-CM

## 2010-10-27 DIAGNOSIS — Z0000 Encounter for general adult medical examination without abnormal findings: Secondary | ICD-10-CM

## 2010-10-27 DIAGNOSIS — H9201 Otalgia, right ear: Secondary | ICD-10-CM

## 2010-10-27 DIAGNOSIS — H9209 Otalgia, unspecified ear: Secondary | ICD-10-CM

## 2010-10-27 DIAGNOSIS — Z1159 Encounter for screening for other viral diseases: Secondary | ICD-10-CM | POA: Insufficient documentation

## 2010-10-27 DIAGNOSIS — Z87442 Personal history of urinary calculi: Secondary | ICD-10-CM

## 2010-10-27 DIAGNOSIS — E538 Deficiency of other specified B group vitamins: Secondary | ICD-10-CM

## 2010-10-27 DIAGNOSIS — Z1231 Encounter for screening mammogram for malignant neoplasm of breast: Secondary | ICD-10-CM

## 2010-10-27 DIAGNOSIS — R350 Frequency of micturition: Secondary | ICD-10-CM

## 2010-10-27 LAB — URINALYSIS, DIPSTICK ONLY
Specific Gravity, UA: 1.015 (ref 1.005–1.030)
pH, UA: 7 (ref 4.5–8.0)

## 2010-10-27 IMAGING — CT CT ABD-PELV W/O CM
2 of 4 series · 17 of 46 positions shown, 19 images · non-contrast
Comparison: None.

CLINICAL DATA: Left flank pain and hematuria.  History of stones.

CT ABDOMEN AND PELVIS WITHOUT CONTRAST
TECHNIQUE: Multidetector CT imaging of the abdomen and pelvis was
performed following the standard protocol without intravenous
contrast.

[Series 2: ap stone study · axial · 0.80mm/px · z∈[-498,-38]mm · 14 of 102 slices shown, 16 images]
[im 5/102  soft-tissue]
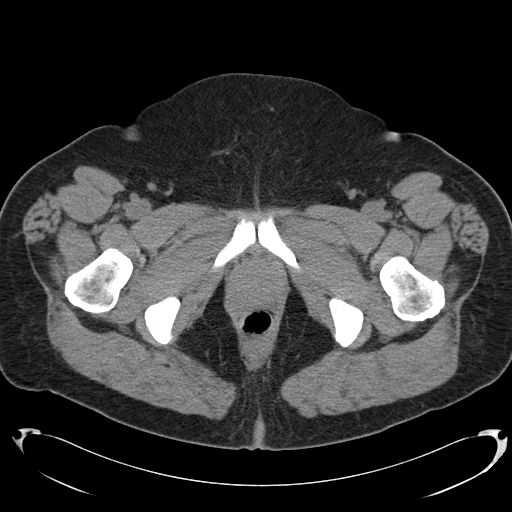
[im 5/102  bone]
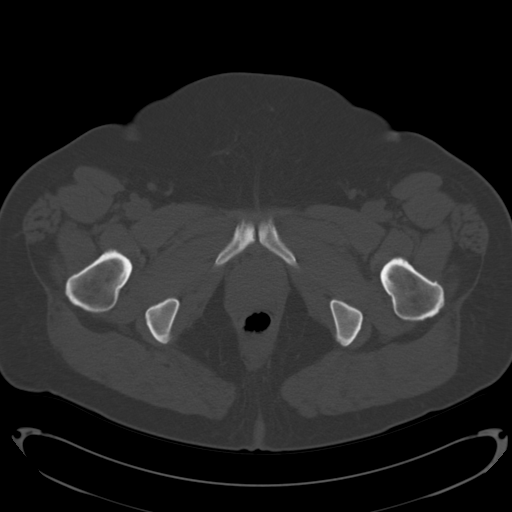
[im 13/102  soft-tissue]
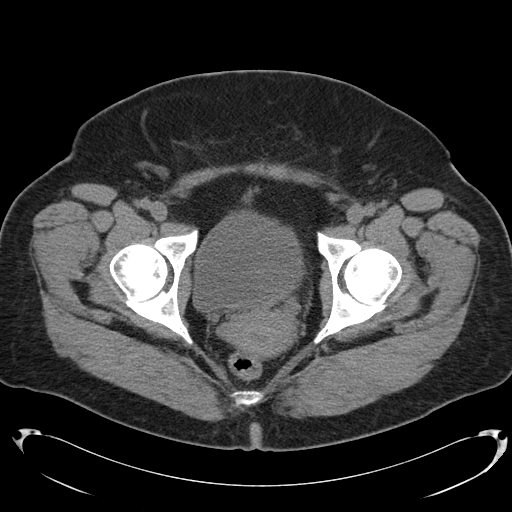
[im 22/102  soft-tissue]
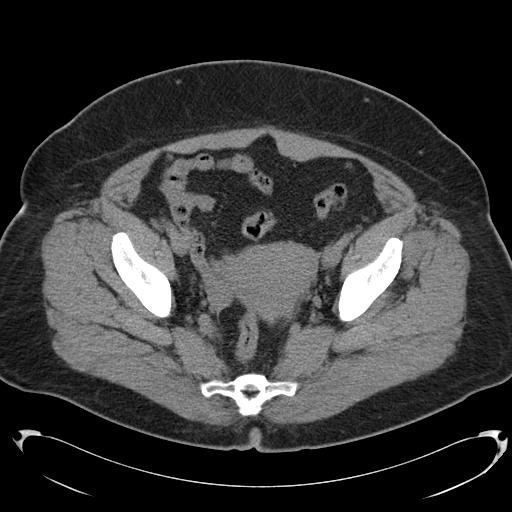
[im 26/102  soft-tissue]
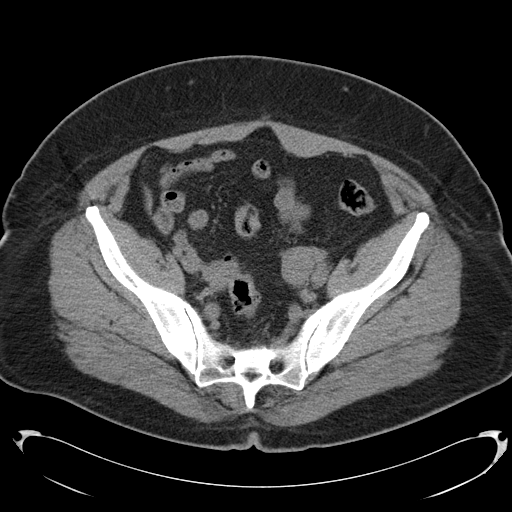
[im 34/102  soft-tissue]
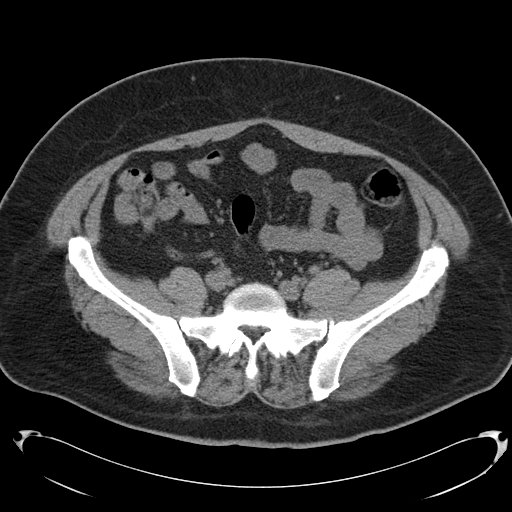
[im 43/102  soft-tissue]
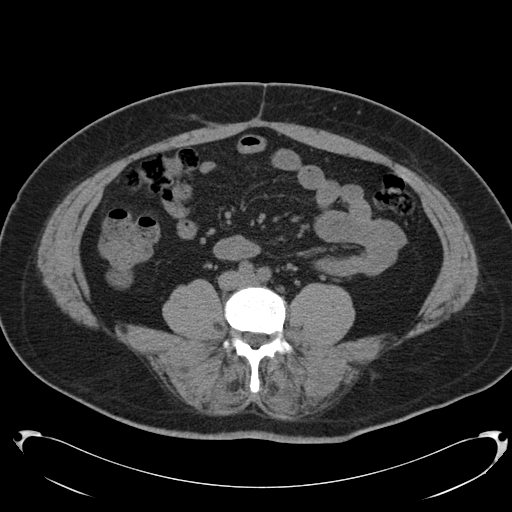
[im 47/102  soft-tissue]
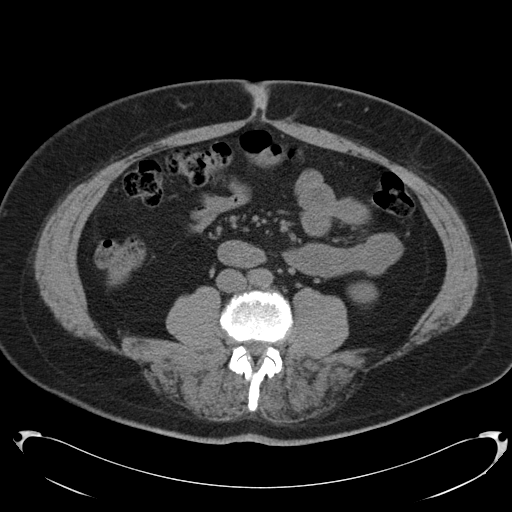
[im 55/102  soft-tissue]
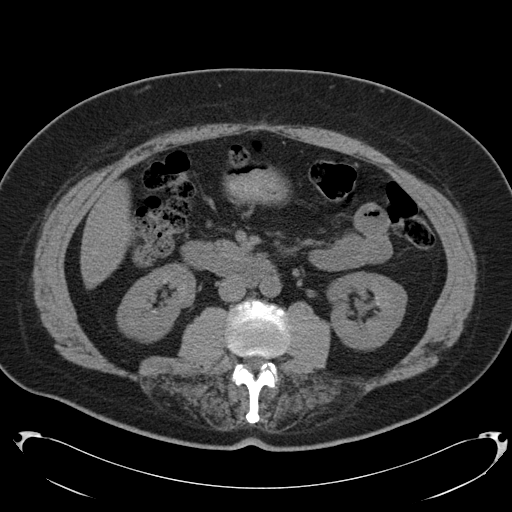
[im 59/102  soft-tissue]
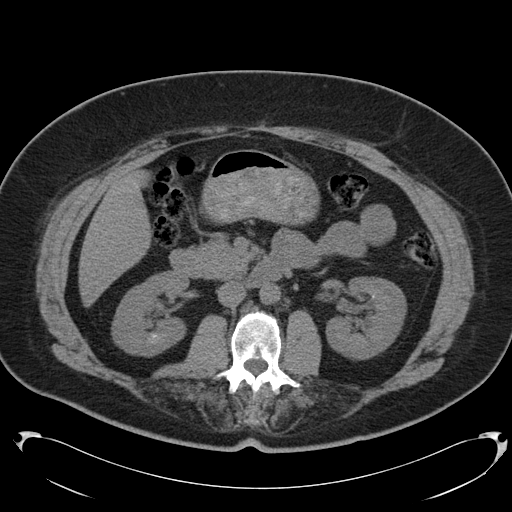
[im 59/102  bone]
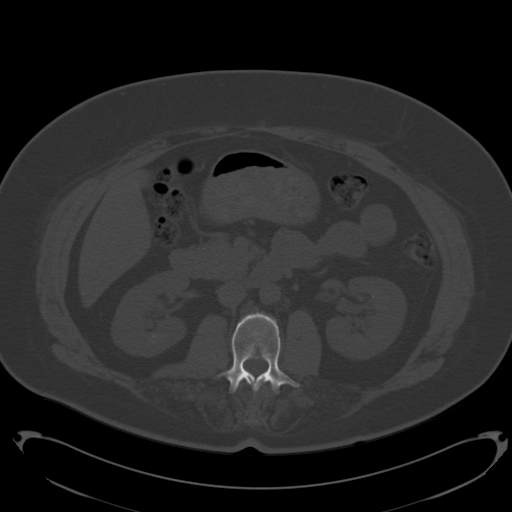
[im 68/102  soft-tissue]
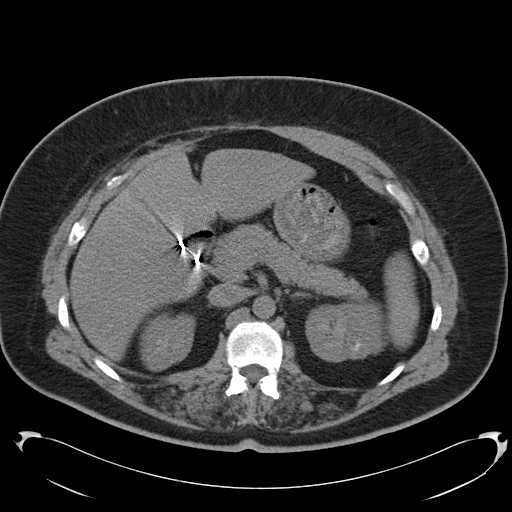
[im 76/102  soft-tissue]
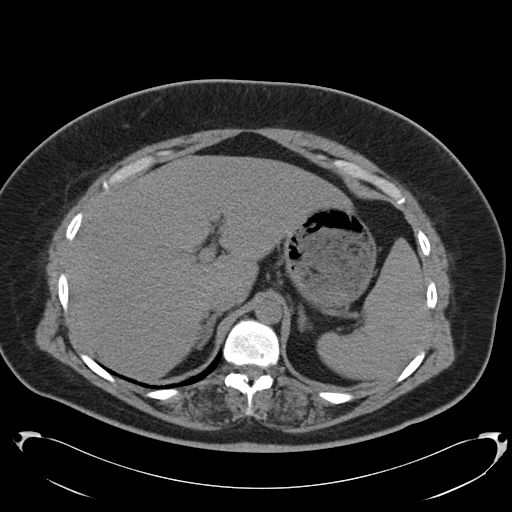
[im 80/102  soft-tissue]
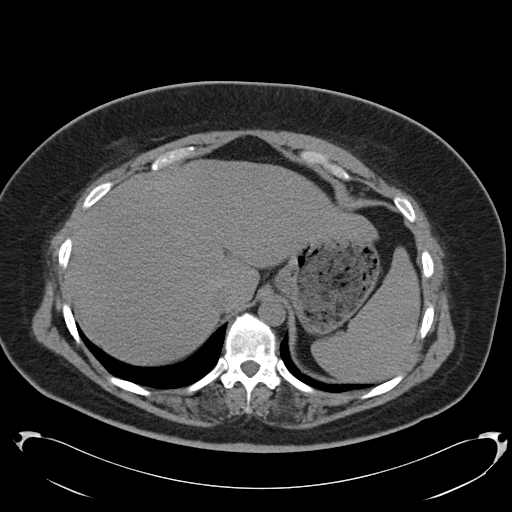
[im 89/102  soft-tissue]
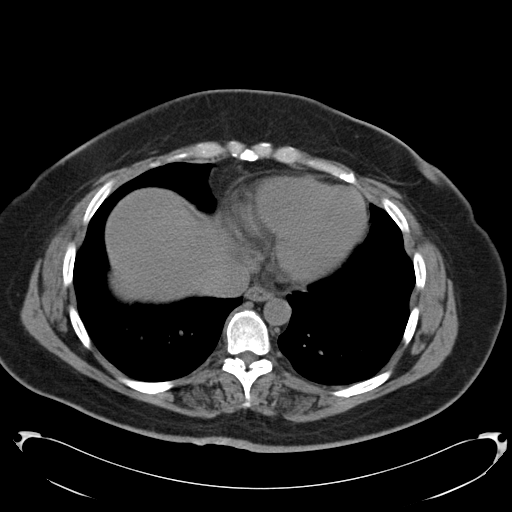
[im 97/102  soft-tissue]
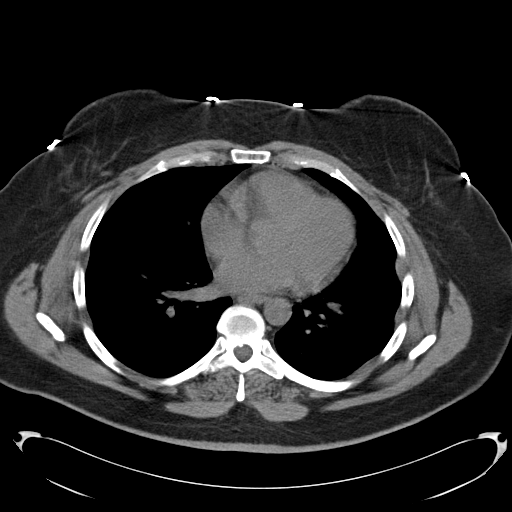

[Series 602: cor · coronal · 1.03mm/px · 3 of 114 slices shown]
[im 38/114  soft-tissue]
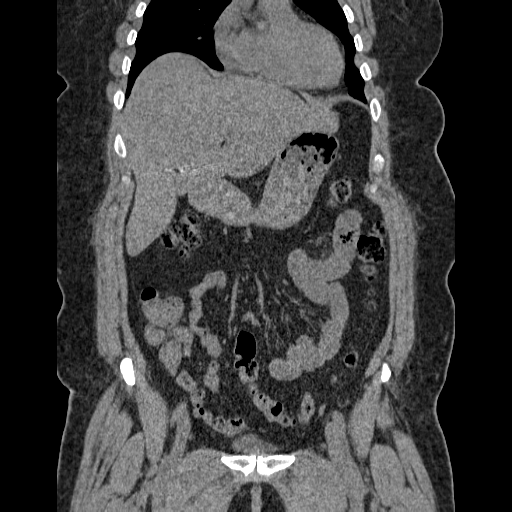
[im 51/114  soft-tissue]
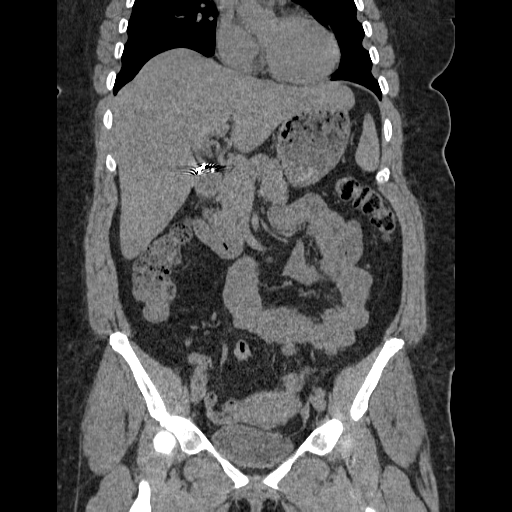
[im 63/114  soft-tissue]
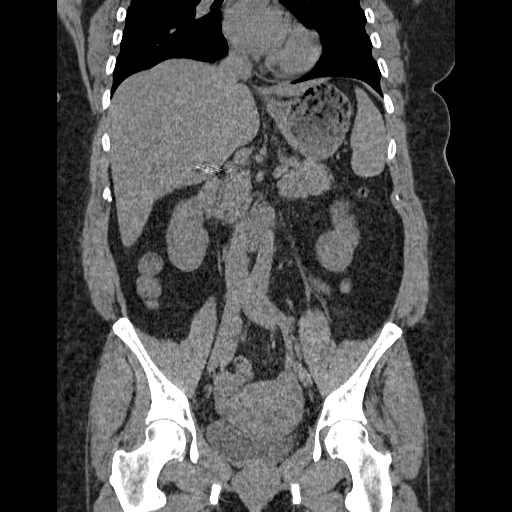

[17 of 46 positions shown; findings below may reference images not displayed]

FINDINGS: No ureteral stones or signs of obstruction.  There are
bilateral nonobstructing intrarenal stones.  The largest of these
are in the lower pole of the right kidney.  There is a focus of
parenchymal calcification in the upper pole of the left kidney
where there is an area of cortical thinning.  There are no
convincing renal masses.  There is no hydronephrosis.  The bladder
is unremarkable.

Clear lung bases.  The liver showed evidence of diffuse fatty
infiltration but is otherwise unremarkable.  Normal spleen and
pancreas.  Gallbladder is surgically absent.  No bile duct
dilation.  No adrenal masses.  The uterus and adnexa are
unremarkable.  No adenopathy.  No abnormal fluid collections.
Normal bowel.  Normal appendix.  Mild degenerative changes of the
visible spine.  Bony structures are otherwise unremarkable.
IMPRESSION: No ureteral stones or signs of obstruction.  No acute findings.
There are bilateral nonobstructing intrarenal stones.

Status post cholecystectomy.  Area of probable scarring with
associated calcification in the upper pole of the left kidney.
Mild degenerative changes of the visible spine.

## 2010-10-27 MED ORDER — TAMSULOSIN HCL 0.4 MG PO CAPS: 0.4000 mg | ORAL_CAPSULE | Freq: Every day | ORAL | Status: DC

## 2010-10-27 MED ORDER — FLUTICASONE PROPIONATE (INHAL) 50 MCG/BLIST IN AEPB: 1.0000 | INHALATION_SPRAY | Freq: Two times a day (BID) | RESPIRATORY_TRACT | Status: DC

## 2010-10-27 NOTE — Assessment & Plan Note (Signed)
Due for reeval. Not taking iron as directed.

## 2010-10-27 NOTE — Patient Instructions (Addendum)
Make sure to schedule fasting labs as soon as able. Stop at front to speak with Shirlee Limerick to set up referrals. Start nasal steroid and restart allegra.

## 2010-10-27 NOTE — Assessment & Plan Note (Addendum)
Due for reeval. No taking B12 as directed.

## 2010-10-27 NOTE — Assessment & Plan Note (Signed)
Due for re-eval. 

## 2010-10-27 NOTE — Telephone Encounter (Signed)
Pt is asking for results of CT that she had done today.  I couldn't tell from chart whether or not they are done.  Please advise pt, if and when results are here.

## 2010-10-27 NOTE — Telephone Encounter (Signed)
Results not back yet

## 2010-10-27 NOTE — Progress Notes (Signed)
Subjective:    Patient ID: Michele Hall, female    DOB: January 29, 1966, 45 y.o.   MRN: [id number]  HPI The patient is here for annual wellness exam and preventative care.    She also has the following acute and chronic issues.  Chronic pain in right ear x 3 months...has had pain in ear ever since flu several months ago. She has had chronic problems off and on in past few years with right ear.  Has noted swelling below. No current sinus pressure, some congestion, post nasal drip. No fever. Does not like taking allegra.  Anemia.Marland Kitchenwas on iron but has not taken in a while.  Left flank pain, ongoing now for 1 month, now getting severe. No dysuria, has been drinking a lot of water, does have urinary frequency and nocturia. No fever.  She did pass a stone several months ago. Has history of  kidney stones (not calcium stones) Uro is Dr. Patsi Sears. Hx of stent placement.  Due for labs but has not had yet and is not fasting today.      Review of Systems  Constitutional: Negative for fever and fatigue.  HENT: Negative for ear pain.   Eyes: Negative for pain.  Respiratory: Negative for chest tightness and shortness of breath.   Cardiovascular: Negative for chest pain, palpitations and leg swelling.  Gastrointestinal: Positive for abdominal pain. Negative for diarrhea and constipation.  Genitourinary: Negative for dysuria and vaginal pain.  Skin: Negative for rash.  Psychiatric/Behavioral: Negative for dysphoric mood. The patient is not nervous/anxious.        Objective:   Physical Exam  Constitutional: Vital signs are normal. She appears well-developed and well-nourished. She is cooperative.  Non-toxic appearance. She does not appear ill. No distress.  HENT:  Head: Normocephalic.  Right Ear: Hearing, external ear and ear canal normal.  Left Ear: Hearing, external ear and ear canal normal.  Nose: Nose normal.       B TMs with clear fluid , no erythema and no bulging.  Eyes:  Conjunctivae, EOM and lids are normal. Pupils are equal, round, and reactive to light. No foreign bodies found.  Neck: Trachea normal and normal range of motion. Neck supple. Carotid bruit is not present. No mass and no thyromegaly present.  Cardiovascular: Normal rate, regular rhythm, S1 normal, S2 normal, normal heart sounds and intact distal pulses.  Exam reveals no gallop.   No murmur heard. Pulmonary/Chest: Effort normal and breath sounds normal. No respiratory distress. She has no wheezes. She has no rhonchi. She has no rales.  Abdominal: Soft. Normal appearance and bowel sounds are normal. She exhibits no distension, no fluid wave, no abdominal bruit and no mass. There is no hepatosplenomegaly. There is tenderness in the left lower quadrant. There is CVA tenderness. There is no rigidity, no rebound and no guarding. No hernia.       Left CVA ttp.. Pain radiuates around to left flank  Genitourinary: Vagina normal and uterus normal. No breast swelling, tenderness, discharge or bleeding. Pelvic exam was performed with patient prone. There is no rash, tenderness or lesion on the right labia. There is no rash, tenderness or lesion on the left labia. Uterus is not enlarged and not tender. Cervix exhibits motion tenderness. Cervix exhibits no discharge and no friability. Right adnexum displays no mass, no tenderness and no fullness. Left adnexum displays no mass, no tenderness and no fullness.  Lymphadenopathy:    She has no cervical adenopathy.    She has no  axillary adenopathy.  Neurological: She is alert. She has normal strength. No cranial nerve deficit or sensory deficit.  Skin: Skin is warm, dry and intact. No rash noted.  Psychiatric: Her speech is normal and behavior is normal. Judgment normal. Her mood appears not anxious. Cognition and memory are normal. She does not exhibit a depressed mood.          Assessment & Plan:  Complete pHysical Exam: The patient's preventative maintenance  and recommended screening tests for an annual wellness exam were reviewed in full today. Brought up to date unless services declined.  Counselled on the importance of diet, exercise, and its role in overall health and mortality. The patient's FH and SH was reviewed, including their home life, tobacco status, and drug and alcohol status.

## 2010-10-27 NOTE — Assessment & Plan Note (Addendum)
Likely due to fluid build up and eustacian tube dysfunction. Start nasal steroid and restart allegra.  Call if not improving in 2 weeks, may need referral to ENT

## 2010-10-27 NOTE — Assessment & Plan Note (Addendum)
Microscopic hematuria and her history suggest...kidney stone.  Past non-calcium stones per pt. Will eval with CT abd/pelvis. Start flomax, push fluids, and strain urine.

## 2010-10-27 NOTE — Assessment & Plan Note (Signed)
Well controlled. Continue current medication.  

## 2010-10-28 ENCOUNTER — Other Ambulatory Visit (INDEPENDENT_AMBULATORY_CARE_PROVIDER_SITE_OTHER): Payer: BC Managed Care – PPO | Admitting: Family Medicine

## 2010-10-28 ENCOUNTER — Telehealth: Payer: Self-pay | Admitting: Family Medicine

## 2010-10-28 DIAGNOSIS — D509 Iron deficiency anemia, unspecified: Secondary | ICD-10-CM

## 2010-10-28 DIAGNOSIS — R3129 Other microscopic hematuria: Secondary | ICD-10-CM

## 2010-10-28 DIAGNOSIS — E538 Deficiency of other specified B group vitamins: Secondary | ICD-10-CM

## 2010-10-28 DIAGNOSIS — Z87442 Personal history of urinary calculi: Secondary | ICD-10-CM

## 2010-10-28 DIAGNOSIS — R109 Unspecified abdominal pain: Secondary | ICD-10-CM

## 2010-10-28 DIAGNOSIS — E785 Hyperlipidemia, unspecified: Secondary | ICD-10-CM

## 2010-10-28 NOTE — Telephone Encounter (Signed)
Message copied by Kerby Nora on Tue Oct 28, 2010 12:56 PM ------      Message from: Michele Hall      Created: Tue Oct 28, 2010  8:31 AM       Patient would like to go ahead with urology referral b/c she is in a lot of pain

## 2010-10-31 NOTE — Telephone Encounter (Signed)
Pt advised of results, per Heather.

## 2010-11-06 ENCOUNTER — Encounter: Payer: Self-pay | Admitting: *Deleted

## 2010-11-17 ENCOUNTER — Ambulatory Visit: Payer: BC Managed Care – PPO

## 2010-11-28 ENCOUNTER — Telehealth: Payer: Self-pay | Admitting: *Deleted

## 2010-11-28 NOTE — Telephone Encounter (Signed)
Pt is asking for lab results from 5/22.  She says she has not heard anything.  Results are not in her chart, there was a system glitch and they didn't get released.  Paper copy is on your desk.  I advised pt that we will call her with results next week, please dont put on phone tree.

## 2010-11-30 NOTE — Telephone Encounter (Signed)
Will review when back in office. 

## 2011-03-02 ENCOUNTER — Other Ambulatory Visit: Payer: Self-pay | Admitting: *Deleted

## 2011-03-02 MED ORDER — SPIRONOLACTONE 25 MG PO TABS: 25.0000 mg | ORAL_TABLET | Freq: Every day | ORAL | Status: DC

## 2011-10-01 ENCOUNTER — Encounter: Payer: Self-pay | Admitting: Internal Medicine

## 2011-10-01 ENCOUNTER — Ambulatory Visit (INDEPENDENT_AMBULATORY_CARE_PROVIDER_SITE_OTHER): Payer: BC Managed Care – PPO | Admitting: Internal Medicine

## 2011-10-01 VITALS — BP 128/80 | HR 93 | Temp 98.5°F | Wt 234.0 lb

## 2011-10-01 DIAGNOSIS — J019 Acute sinusitis, unspecified: Secondary | ICD-10-CM

## 2011-10-01 DIAGNOSIS — J329 Chronic sinusitis, unspecified: Secondary | ICD-10-CM | POA: Insufficient documentation

## 2011-10-01 MED ORDER — AMOXICILLIN 500 MG PO TABS: 1000.0000 mg | ORAL_TABLET | Freq: Two times a day (BID) | ORAL | Status: AC

## 2011-10-01 NOTE — Patient Instructions (Signed)
Call on Monday to change antibiotics if you are not considerably better

## 2011-10-01 NOTE — Progress Notes (Signed)
  Subjective:    Patient ID: Michele Hall, female    DOB: 01/19/1965, 46 y.o.   MRN: [id number]  HPI Has been sick Throat is sore Right ear is sore--off and on for last week Eye pain at [place]  Feels that sinuses are congested and draining Green nasal discharge Exposed to flu/strep at her son's wedding this past weekend  Some cough---seems to be from PND Intermittent fever--has sweats at times and then chilled No SOB  Has tried allergy tab OTC--not clearly helpful theraflu---couldn't get it down Current Outpatient Prescriptions on File Prior to Visit  Medication Sig Dispense Refill  . spironolactone (ALDACTONE) 25 MG tablet Take 1 tablet (25 mg total) by mouth daily.  30 tablet  9  . Tamsulosin HCl (FLOMAX) 0.4 MG CAPS Take 1 capsule (0.4 mg total) by mouth daily.  20 capsule  0    Allergies  Allergen Reactions  . Ibuprofen     REACTION: Gi upset    No past medical history on file.  Past Surgical History  Procedure Date  . Cholecystectomy     Family History  Problem Relation Age of Onset  . Stroke Mother   . Kidney disease Father   . Coronary artery disease Father   . Aneurysm Father   . Hypertension Father   . Seizures Sister   . Seizures Brother   . Heart attack Maternal Grandfather   . Seizures Sister   . Cancer Sister     ovarian/uterus    History   Social History  . Marital Status: Married    Spouse Name: N/A    Number of Children: 2  . Years of Education: N/A   Occupational History  . house cleaner/ former bus driver    Social History Main Topics  . Smoking status: Former Games developer  . Smokeless tobacco: Not on file  . Alcohol Use: No  . Drug Use: Not on file  . Sexually Active: Not on file   Other Topics Concern  . Not on file   Social History Narrative  . No narrative on file   Review of Systems No rash No vomiting. Some loose stools Appetite is okay---just slight nausea    Objective:   Physical Exam  Constitutional: She  appears well-developed and well-nourished. No distress.  HENT:  Mouth/Throat: Oropharynx is clear and moist. No oropharyngeal exudate.       No sinus tenderness Mild injection superior portion of TMs Marked inflammation on right nose  Neck: Normal range of motion. Neck supple.  Pulmonary/Chest: Effort normal and breath sounds normal. No respiratory distress. She has no wheezes. She has no rales.  Lymphadenopathy:    She has no cervical adenopathy.          Assessment & Plan:

## 2011-10-01 NOTE — Assessment & Plan Note (Signed)
Not clear if viral or bacterial but has early changes in TMs Will go ahead and treat with amoxil Discussed analgesics

## 2011-10-20 ENCOUNTER — Telehealth: Payer: Self-pay | Admitting: Family Medicine

## 2011-10-20 MED ORDER — CLARITHROMYCIN 500 MG PO TABS: 500.0000 mg | ORAL_TABLET | Freq: Two times a day (BID) | ORAL | Status: AC

## 2011-10-20 NOTE — Telephone Encounter (Signed)
Patient advised.

## 2011-10-20 NOTE — Telephone Encounter (Signed)
Given just seen and not completed amox course due to SE. Will change antibiotic to clarithromycin. Sent in rx, let pt know please.

## 2011-10-20 NOTE — Telephone Encounter (Signed)
Caller: Aunna/Patient is calling with a question about Amoxicillin.The medication was written by Karie Schwalbe  Started  Amox 500 mg BID since 10/14/11 for sinus infection and otitis media but stopped it d/t  nausea that interferred with sleep. Afebrile. Face and nose "sore."   Headache at top of head and around sinus.  Painful to bend over.  Asking to change medication since unable to tolerate Amox.  BP 123/83.  LMP 10/13/11.  Hx BTL. May use Mucinex as directed.  Informed of MD orders that antibiotic cannot be ordered unless seen.  Since just seen last week, would like MD to be contacted re antibiotic change. Info noted and sent to MD for moderate to severe headache unrelieved by non-prescription medication per URI Guideline.   [facility] [phone].

## 2012-01-01 ENCOUNTER — Encounter: Payer: Self-pay | Admitting: Gastroenterology

## 2012-01-01 ENCOUNTER — Telehealth: Payer: Self-pay | Admitting: *Deleted

## 2012-01-01 MED ORDER — OMEPRAZOLE 40 MG PO CPDR: 40.0000 mg | DELAYED_RELEASE_CAPSULE | Freq: Every day | ORAL | Status: DC

## 2012-01-01 NOTE — Telephone Encounter (Signed)
Add to med list.. Okay to refill

## 2012-01-01 NOTE — Telephone Encounter (Signed)
Patient called for refill of Omeprazole 40mg  to be sent to [facility]. Not on current med list. Please advise.

## 2012-01-28 ENCOUNTER — Other Ambulatory Visit: Payer: Self-pay | Admitting: Family Medicine

## 2012-02-05 ENCOUNTER — Ambulatory Visit: Payer: BC Managed Care – PPO | Admitting: Gastroenterology

## 2012-02-10 ENCOUNTER — Encounter: Payer: Self-pay | Admitting: Family Medicine

## 2012-02-10 ENCOUNTER — Ambulatory Visit (INDEPENDENT_AMBULATORY_CARE_PROVIDER_SITE_OTHER): Payer: BC Managed Care – PPO | Admitting: Family Medicine

## 2012-02-10 VITALS — BP 116/74 | HR 96 | Temp 98.5°F | Resp 18 | Wt 250.2 lb

## 2012-02-10 DIAGNOSIS — J019 Acute sinusitis, unspecified: Secondary | ICD-10-CM

## 2012-02-10 DIAGNOSIS — K111 Hypertrophy of salivary gland: Secondary | ICD-10-CM

## 2012-02-10 DIAGNOSIS — J301 Allergic rhinitis due to pollen: Secondary | ICD-10-CM

## 2012-02-10 MED ORDER — MOMETASONE FUROATE 50 MCG/ACT NA SUSP: 2.0000 | Freq: Every day | NASAL | Status: DC

## 2012-02-10 MED ORDER — AMOXICILLIN-POT CLAVULANATE 875-125 MG PO TABS: 1.0000 | ORAL_TABLET | Freq: Two times a day (BID) | ORAL | Status: AC

## 2012-02-10 NOTE — Progress Notes (Signed)
Nature conservation officer at [facility] [address] [place] [address] Phone: [phone] Fax: [phone]  Date:  02/10/2012   Name:  Michele Hall   DOB:  1966-06-03   MRN:  [id number] Gender: female Age: 46 y.o.  PCP:  Kerby Nora, MD    Chief Complaint: Sinusitis and Otalgia   History of Present Illness:  Michele Hall is a 46 y.o. 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 She also is having some bilateral ear pain, right greater than left.  No fever chills or sweats.  History is significant for a family history of tongue cancer in her sister. She also has a grandmother who had some parotid or tongue or head or neck cancer as well.  Patient Active Problem List  Diagnosis  . HYPERLIPIDEMIA  . OBESITY  . RESTLESS LEG SYNDROME  . HYPERTENSION  . GERD  . IBS  . EXCESSIVE MENSTRUATION  . VITAMIN B12 DEFICIENCY  . ANEMIA, IRON DEFICIENCY  . History of nephrolithiasis  . Acute sinusitis, unspecified    No past medical history on file.  Past Surgical History  Procedure Date  . Cholecystectomy     History  Substance Use Topics  . Smoking status: Former Smoker    Quit date: 02/09/2002  . Smokeless tobacco: Never Used  . Alcohol Use: No    Family History  Problem Relation Age of Onset  . Stroke Mother   . Kidney disease Father   . Coronary artery disease Father   . Aneurysm Father   . Hypertension Father   . Seizures Sister   . Seizures Brother   . Heart attack Maternal Grandfather   . Seizures Sister   . Cancer Sister     ovarian/uterus    Allergies  Allergen Reactions  . Ibuprofen     REACTION: Gi upset    Medication list has been reviewed and updated.  Current Outpatient Prescriptions on File Prior to Visit    Medication Sig Dispense Refill  . loratadine (CLARITIN) 10 MG tablet Take 10 mg by mouth daily.      Marland Kitchen omeprazole (PRILOSEC) 40 MG capsule Take 1 capsule (40 mg total) by mouth daily.  30 capsule  11  . spironolactone (ALDACTONE) 25 MG tablet TAKE 1 TABLET BY MOUTH DAILY  30 tablet  1  . Tamsulosin HCl (FLOMAX) 0.4 MG CAPS Take 1 capsule (0.4 mg total) by mouth daily.  20 capsule  0    Review of Systems:  ROS: GEN: Acute illness details above GI: Tolerating PO intake GU: maintaining adequate hydration and urination Pulm: No SOB Interactive and getting along well at home.  Otherwise, ROS is as per the HPI.   Physical Examination: Filed Vitals:   02/10/12 1549  BP: 116/74  Pulse: 96  Temp: 98.5 F (36.9 C)  Resp: 18   Filed Vitals:   02/10/12 1549  Weight: 250 lb 4 oz (113.513 kg)   There is no height on file to calculate BMI. Ideal Body Weight:     Gen: WDWN, NAD; alert,appropriate and cooperative throughout exam  HEENT: Normocephalic and atraumatic. Throat clear, w/o exudate, no LAD, R TM clear, L TM - good landmarks, No fluid present. rhinnorhea.  Parotid gland is enlarged on exam on the right.  Left frontal and maxillary sinuses: Tender Right frontal and maxillary sinuses: Tender  Neck: No ant or post LAD CV: RRR, No M/G/R Pulm: Breathing comfortably in no resp distress. no w/c/r Abd: S,NT,ND,+BS Extr: no c/c/e Psych: full affect, pleasant   Assessment and Plan:  1. Sinusitis acute   2. Parotid gland enlargement   3. Allergic rhinitis due to pollen     Likely secondary sinusitis after allergy flareup over the last few weeks. Continue with antihistamines, add Nasonex.  Parotiditis, unclear origin. Treat with Augmentin, which will also treat above. If she is not returned to normal size, I would recommend an ENT evaluation in 3-4 weeks  Orders Today:  No orders of the defined types were placed in this encounter.    Medications Today: (Includes new updates  added during medication reconciliation) Meds ordered this encounter  Medications  . loratadine (CLARITIN) 10 MG tablet    Sig: Take 10 mg by mouth daily.  Marland Kitchen amoxicillin-clavulanate (AUGMENTIN) 875-125 MG per tablet    Sig: Take 1 tablet by mouth 2 (two) times daily.    Dispense:  20 tablet    Refill:  0  . mometasone (NASONEX) 50 MCG/ACT nasal spray    Sig: Place 2 sprays into the nose daily.    Dispense:  17 g    Refill:  12    Medications Discontinued: There are no discontinued medications.   Hannah Beat, MD,

## 2012-02-29 ENCOUNTER — Ambulatory Visit (INDEPENDENT_AMBULATORY_CARE_PROVIDER_SITE_OTHER): Payer: BC Managed Care – PPO | Admitting: Family Medicine

## 2012-02-29 ENCOUNTER — Encounter: Payer: Self-pay | Admitting: Family Medicine

## 2012-02-29 VITALS — BP 120/78 | HR 88 | Temp 98.2°F | Wt 249.0 lb

## 2012-02-29 DIAGNOSIS — Z808 Family history of malignant neoplasm of other organs or systems: Secondary | ICD-10-CM

## 2012-02-29 DIAGNOSIS — K111 Hypertrophy of salivary gland: Secondary | ICD-10-CM

## 2012-02-29 NOTE — Patient Instructions (Addendum)
REFERRAL: GO THE THE FRONT ROOM AT THE ENTRANCE OF OUR CLINIC, NEAR CHECK IN. ASK FOR Michele Hall. SHE WILL HELP YOU SET UP YOUR REFERRAL. DATE: TIME:  

## 2012-02-29 NOTE — Progress Notes (Signed)
Nature conservation officer at [facility] [address] [place] [address] Phone: [phone] Fax: [phone]  Date:  02/29/2012   Name:  Michele Hall   DOB:  May 10, 1966   MRN:  [id number] Gender: female Age: 46 y.o.  PCP:  Kerby Nora, MD    Chief Complaint: Follow-up- better but still has know in front of right ear.   History of Present Illness:  Michele Hall is a 46 y.o. pleasant patient who presents with the following:  Followup from approximately 3 weeks ago, when I saw and treated the patient for sinusitis. At that time she also had an enlarged parotid gland on the right, so treated her with Augmentin to cover for both her sinus infection as well as some parotiditis. Her parotid gland is still enlarged, and still is mildly tender to palpation.  F Hx: Significant for having a squamous cell carcinoma of the tongue in her sister who is apparently had a pretty extensive wound of some cancer and chemotherapy as well as surgery. The patient's grandmother also had a head and neck cancer as well.  Patient Active Problem List  Diagnosis  . HYPERLIPIDEMIA  . OBESITY  . RESTLESS LEG SYNDROME  . HYPERTENSION  . GERD  . IBS  . EXCESSIVE MENSTRUATION  . VITAMIN B12 DEFICIENCY  . ANEMIA, IRON DEFICIENCY  . History of nephrolithiasis  . Acute sinusitis, unspecified    No past medical history on file.  Past Surgical History  Procedure Date  . Cholecystectomy     History  Substance Use Topics  . Smoking status: Former Smoker    Quit date: 02/09/2002  . Smokeless tobacco: Never Used  . Alcohol Use: No    Family History  Problem Relation Age of Onset  . Stroke Mother   . Kidney disease Father   . Coronary artery disease Father   . Aneurysm Father   . Hypertension Father   . Seizures Sister   . Seizures Brother   . Heart attack Maternal Grandfather   . Seizures Sister   . Cancer Sister     ovarian/uterus  . Tongue cancer Sister     Allergies  Allergen  Reactions  . Ibuprofen     REACTION: Gi upset    Medication list has been reviewed and updated.  Outpatient Prescriptions Prior to Visit  Medication Sig Dispense Refill  . loratadine (CLARITIN) 10 MG tablet Take 10 mg by mouth daily.      Marland Kitchen omeprazole (PRILOSEC) 40 MG capsule Take 1 capsule (40 mg total) by mouth daily.  30 capsule  11  . spironolactone (ALDACTONE) 25 MG tablet TAKE 1 TABLET BY MOUTH DAILY  30 tablet  1  . mometasone (NASONEX) 50 MCG/ACT nasal spray Place 2 sprays into the nose daily.  17 g  12  . Tamsulosin HCl (FLOMAX) 0.4 MG CAPS Take 1 capsule (0.4 mg total) by mouth daily.  20 capsule  0    Review of Systems:  As above. No fevers, chills, sweats or night sweats. No weight loss. Enlargement of parotid gland remains.  Physical Examination: Filed Vitals:   02/29/12 1157  BP: 120/78  Pulse: 88  Temp: 98.2 F (36.8 C)   Filed Vitals:   02/29/12 1157  Weight: 249 lb (112.946 kg)   There is no height on file to calculate BMI. Ideal Body Weight:     GEN: WDWN, NAD, Non-toxic, A & O x 3 HEENT: Atraumatic, Normocephalic. Neck supple. Right-sided parotid gland  does appear: Enlarged compared to the left side and is tender to palpation.  Ears and Nose: No external deformity. CV: RRR, No M/G/R. No JVD. No thrill. No extra heart sounds. PULM: CTA B, no wheezes, crackles, rhonchi. No retractions. No resp. distress. No accessory muscle use. EXTR: No c/c/e NEURO Normal gait.  PSYCH: Normally interactive. Conversant. Not depressed or anxious appearing.  Calm demeanor.    Assessment and Plan:  1. Enlarged parotid gland  Ambulatory referral to ENT  2. Family history of malignant neoplasm of tongue  Ambulatory referral to ENT   Consult ENT given painful enlarged parotid gland for multiple months without improvement on antibiotics. Family history of neoplasm. Appreciate consult.  Orders Today:  Orders Placed This Encounter  Procedures  . Ambulatory referral to  ENT    Referral Priority:  Routine    Referral Type:  Consultation    Referral Reason:  Specialty Services Required    Requested Specialty:  Otolaryngology    Number of Visits Requested:  1    Updated Medication List: (Includes new medications, updates to list, dose adjustments) Outpatient Encounter Prescriptions as of 02/29/2012  Medication Sig Dispense Refill  . loratadine (CLARITIN) 10 MG tablet Take 10 mg by mouth daily.      Marland Kitchen omeprazole (PRILOSEC) 40 MG capsule Take 1 capsule (40 mg total) by mouth daily.  30 capsule  11  . spironolactone (ALDACTONE) 25 MG tablet TAKE 1 TABLET BY MOUTH DAILY  30 tablet  1  . DISCONTD: mometasone (NASONEX) 50 MCG/ACT nasal spray Place 2 sprays into the nose daily.  17 g  12  . DISCONTD: Tamsulosin HCl (FLOMAX) 0.4 MG CAPS Take 1 capsule (0.4 mg total) by mouth daily.  20 capsule  0    Medications Discontinued: Medications Discontinued During This Encounter  Medication Reason  . mometasone (NASONEX) 50 MCG/ACT nasal spray Error  . Tamsulosin HCl (FLOMAX) 0.4 MG CAPS Error     Hannah Beat, MD

## 2012-03-01 ENCOUNTER — Ambulatory Visit
Admission: RE | Admit: 2012-03-01 | Discharge: 2012-03-01 | Disposition: A | Discharge status: Home / Self Care | Payer: BC Managed Care – PPO | Source: Ambulatory Visit | Attending: Otolaryngology | Admitting: Otolaryngology

## 2012-03-01 ENCOUNTER — Other Ambulatory Visit: Payer: Self-pay | Admitting: Otolaryngology

## 2012-03-01 DIAGNOSIS — M542 Cervicalgia: Secondary | ICD-10-CM

## 2012-03-01 IMAGING — CR DG CERVICAL SPINE COMPLETE 4+V
7 series · 7 of 7 positions shown · non-contrast
Comparison: [DATE].

CLINICAL DATA: 46-year-old female neck stiffness, numbness and
tingling in the upper extremities.  Headache.

CERVICAL SPINE - COMPLETE 4+ VIEW

[view not recorded (1 of 7)]
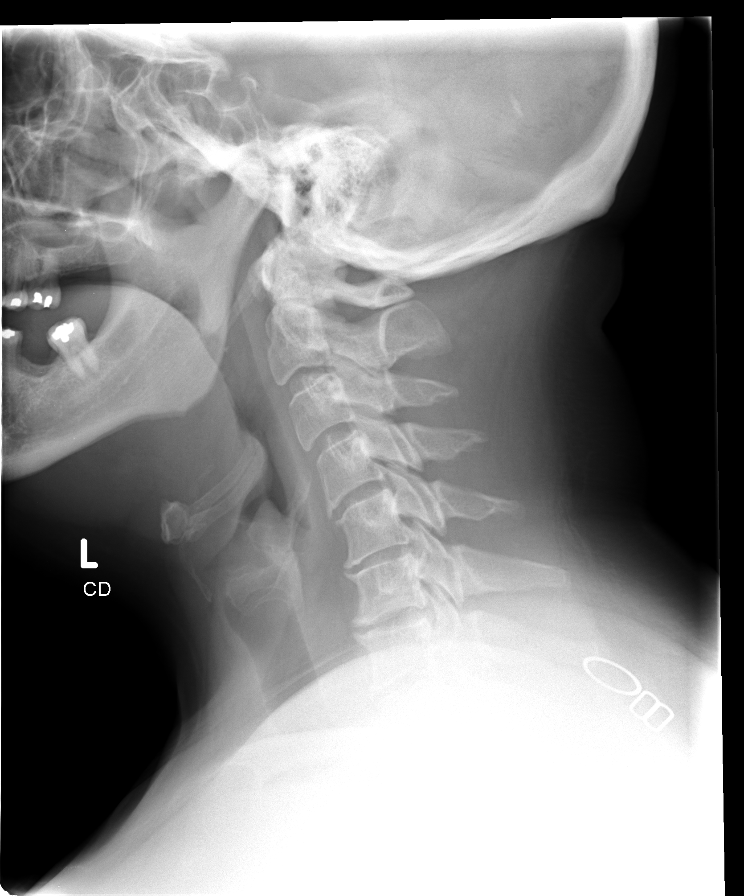

[view not recorded (2 of 7)]
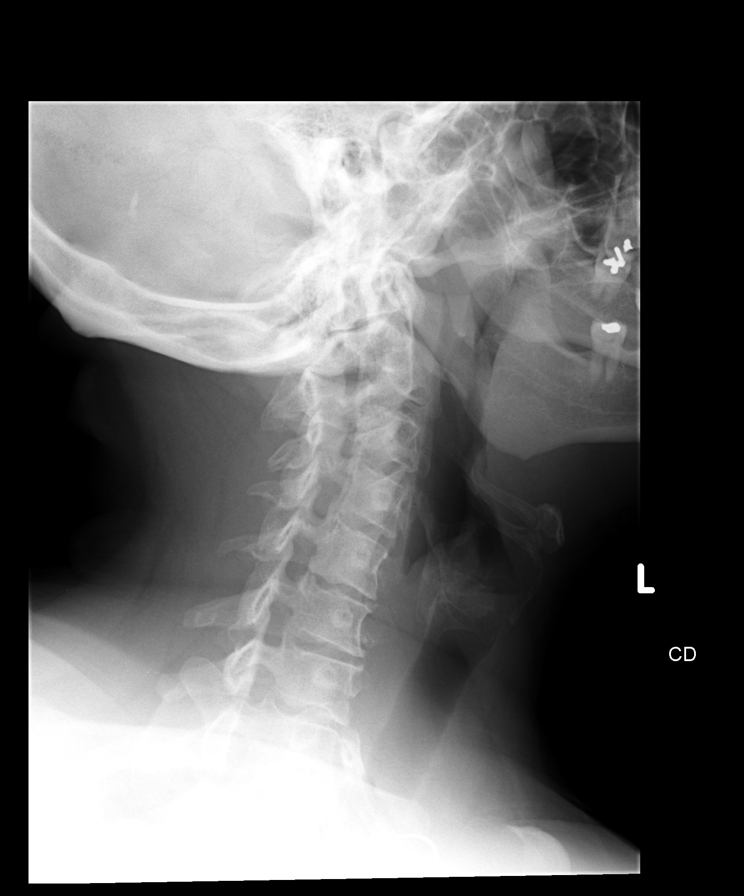

[view not recorded (3 of 7)]
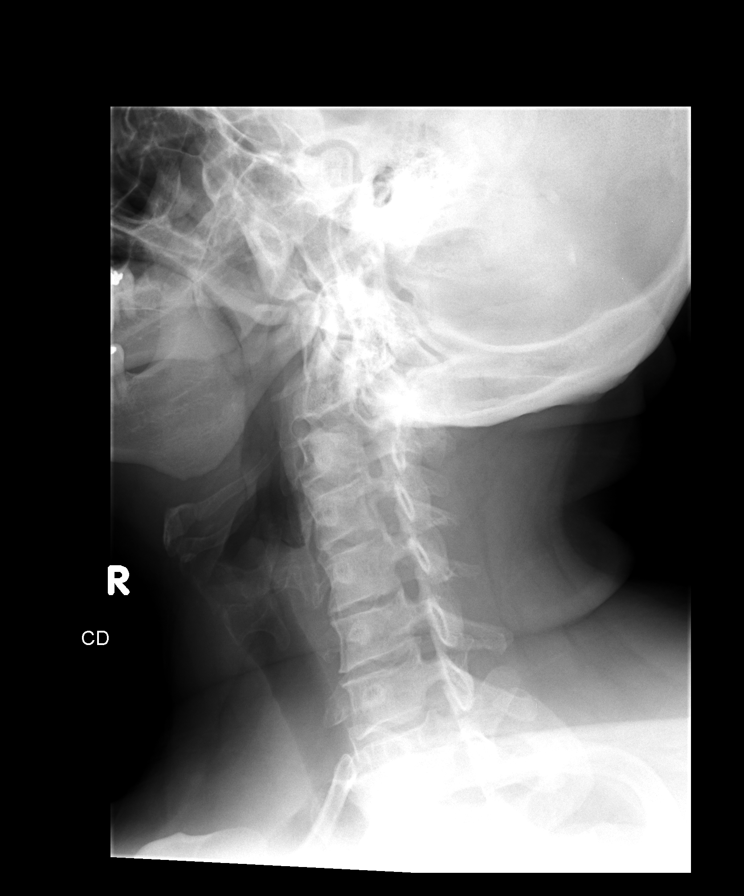

[view not recorded (4 of 7)]
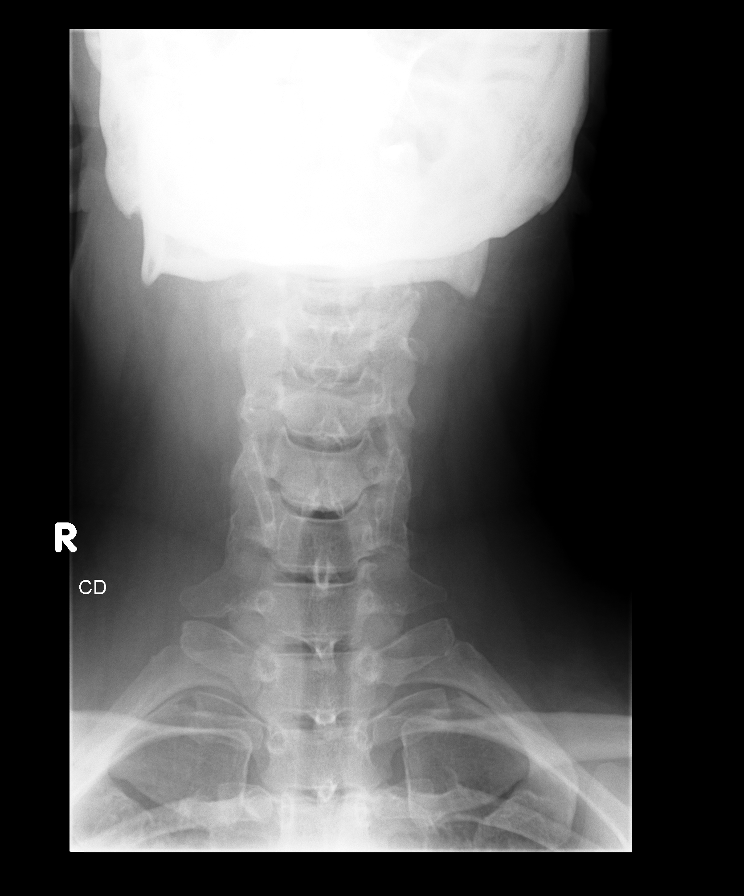

[view not recorded (5 of 7)]
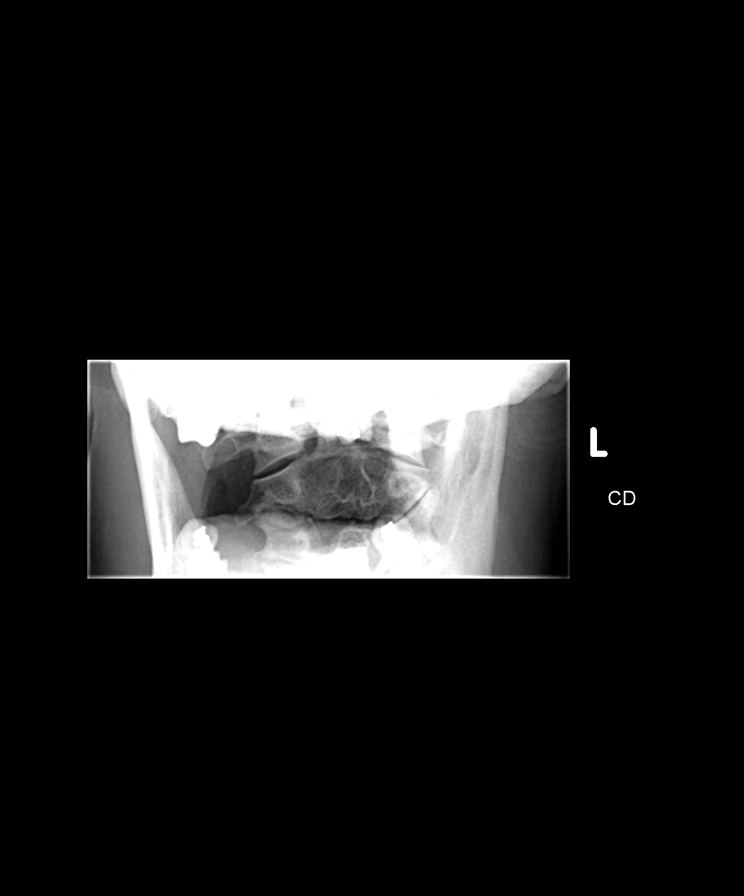

[view not recorded (6 of 7)]
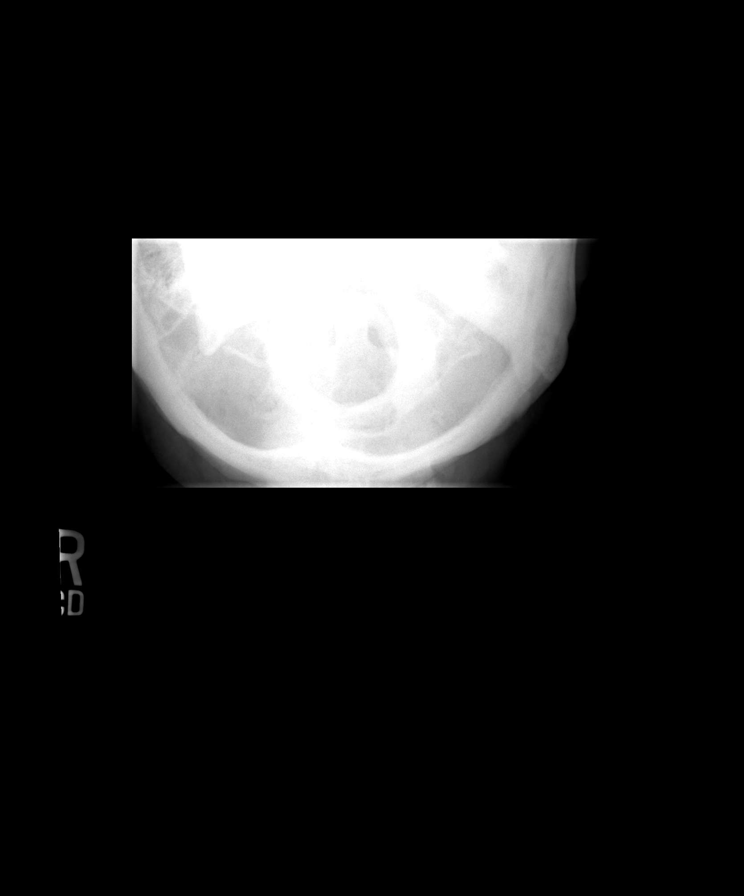

[view not recorded (7 of 7)]
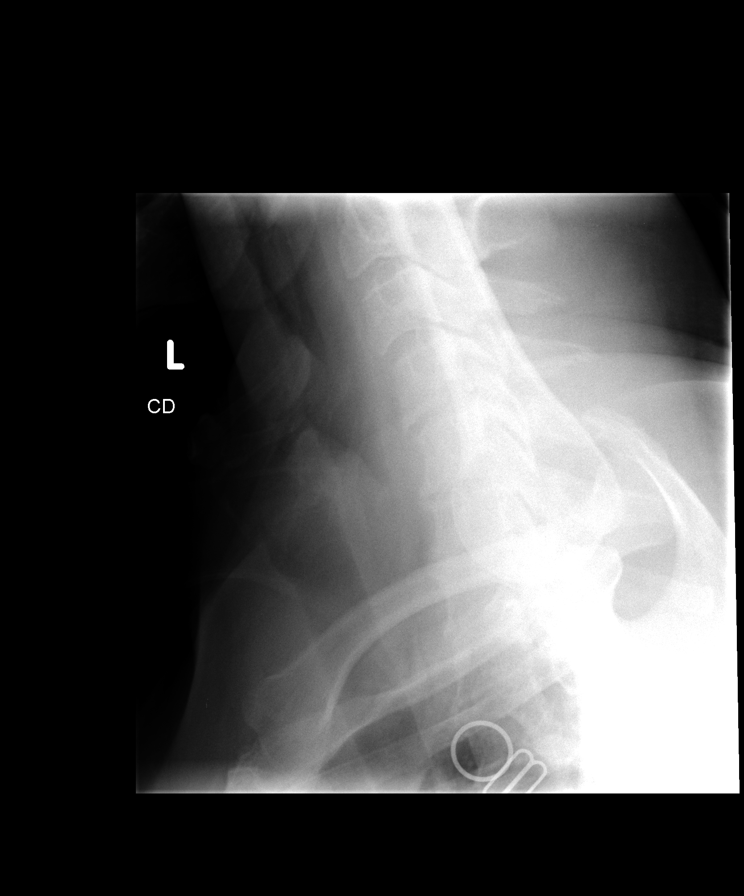

[7 of 7 positions shown; findings below may reference images not displayed]

FINDINGS: Chronic mild reversal of cervical lordosis, mildly
progressed since prior.  Prevertebral soft tissue contours remain
within normal limits.  Disc spaces are stable.  Mild disc space
loss and moderate endplate spurring in the lower cervical spine.
Bilateral posterior element alignment is within normal limits.  AP
alignment and lung apices within normal limits.  C1-C2 alignment
and odontoid within normal limits. Cervicothoracic junction
alignment is within normal limits.
IMPRESSION: Chronic lower cervical disc degeneration. No acute osseous
abnormality in the cervical spine.

## 2012-03-17 ENCOUNTER — Encounter (HOSPITAL_COMMUNITY): Payer: Self-pay | Admitting: Emergency Medicine

## 2012-03-17 ENCOUNTER — Emergency Department (HOSPITAL_COMMUNITY): Payer: BC Managed Care – PPO

## 2012-03-17 ENCOUNTER — Emergency Department (HOSPITAL_COMMUNITY)
Admission: EM | Admit: 2012-03-17 | Discharge: 2012-03-17 | Disposition: A | Discharge status: Home Health | Payer: BC Managed Care – PPO | Attending: Emergency Medicine | Admitting: Emergency Medicine

## 2012-03-17 DIAGNOSIS — M7731 Calcaneal spur, right foot: Secondary | ICD-10-CM

## 2012-03-17 DIAGNOSIS — Z87891 Personal history of nicotine dependence: Secondary | ICD-10-CM | POA: Insufficient documentation

## 2012-03-17 DIAGNOSIS — Z884 Allergy status to anesthetic agent status: Secondary | ICD-10-CM | POA: Insufficient documentation

## 2012-03-17 DIAGNOSIS — M7730 Calcaneal spur, unspecified foot: Secondary | ICD-10-CM | POA: Insufficient documentation

## 2012-03-17 DIAGNOSIS — M79609 Pain in unspecified limb: Secondary | ICD-10-CM | POA: Insufficient documentation

## 2012-03-17 DIAGNOSIS — I10 Essential (primary) hypertension: Secondary | ICD-10-CM | POA: Insufficient documentation

## 2012-03-17 DIAGNOSIS — M79673 Pain in unspecified foot: Secondary | ICD-10-CM

## 2012-03-17 HISTORY — DX: Essential (primary) hypertension: I10

## 2012-03-17 IMAGING — CR DG FOOT COMPLETE 3+V*R*
3 series · 3 of 3 positions shown · non-contrast
Comparison: None

CLINICAL DATA: Right foot pain.

RIGHT FOOT COMPLETE - 3+ VIEW

[t foot ap right]
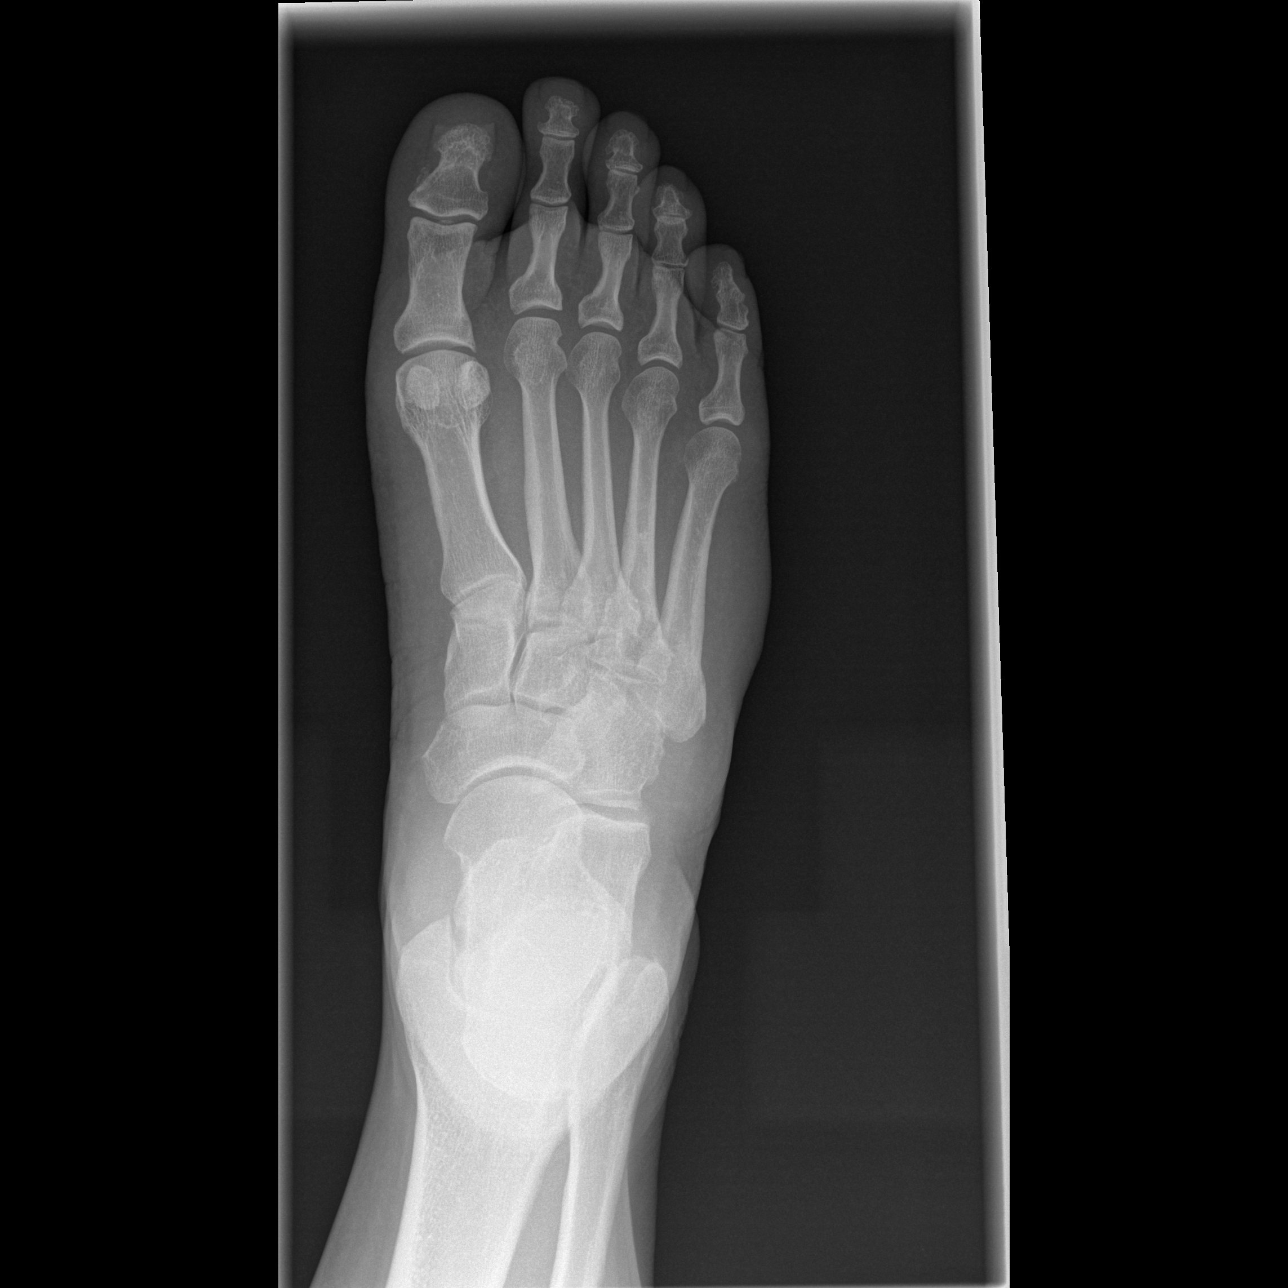

[t foot oblique right]
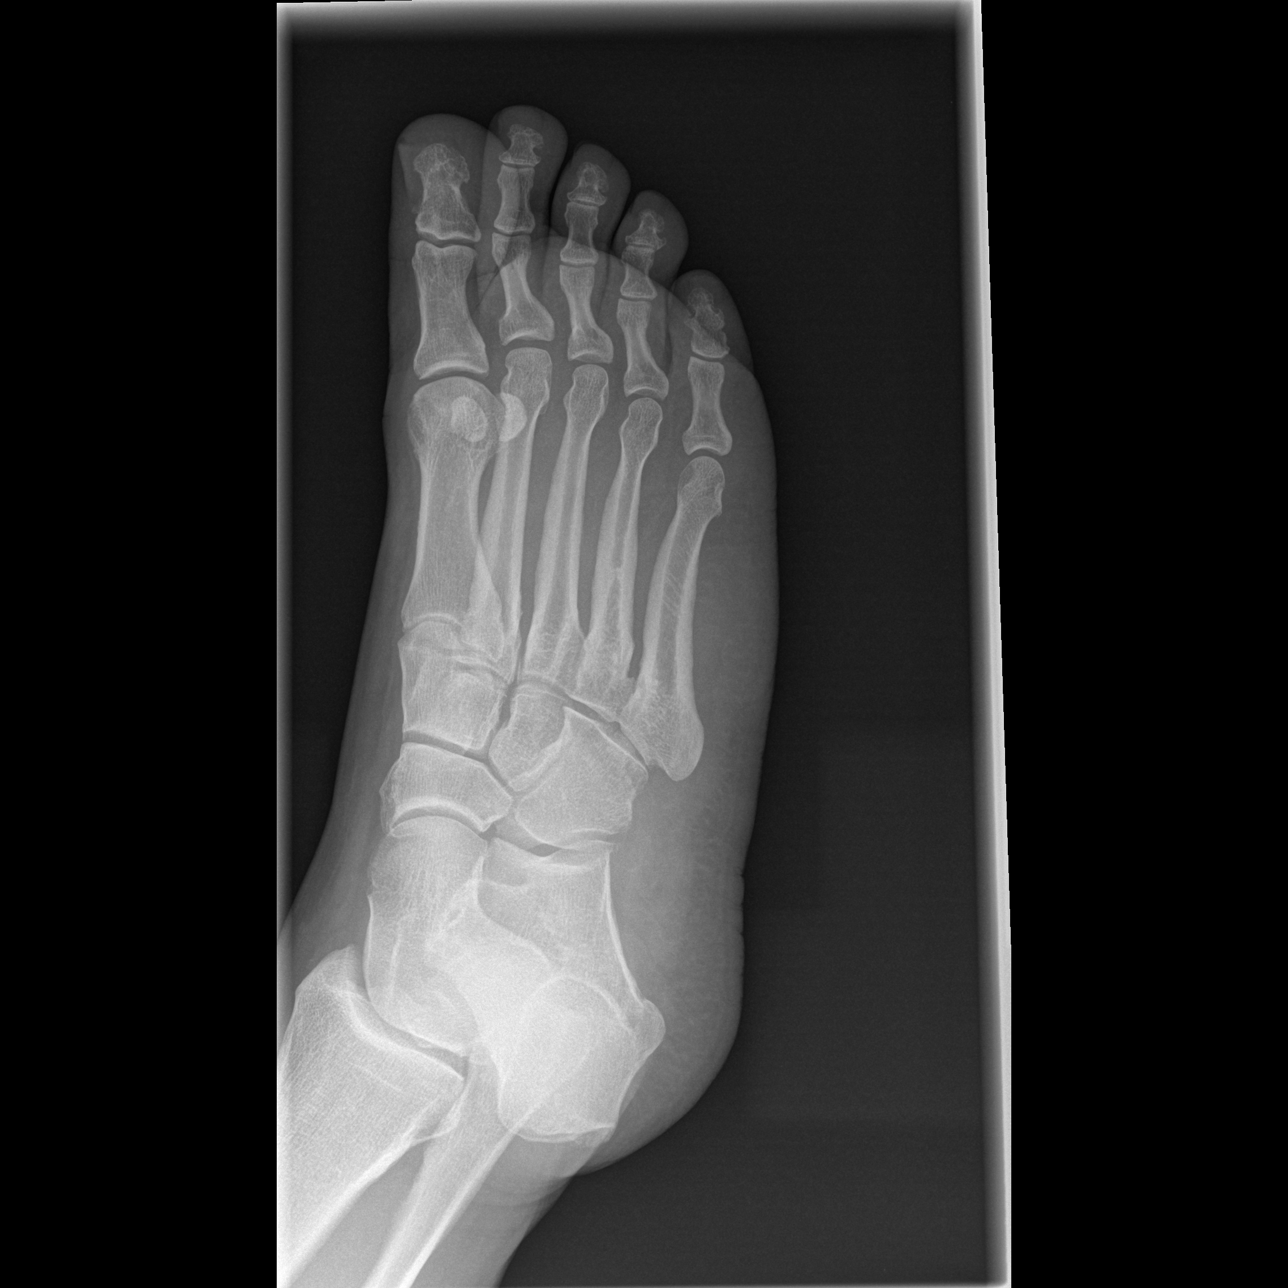

[t foot lat right]
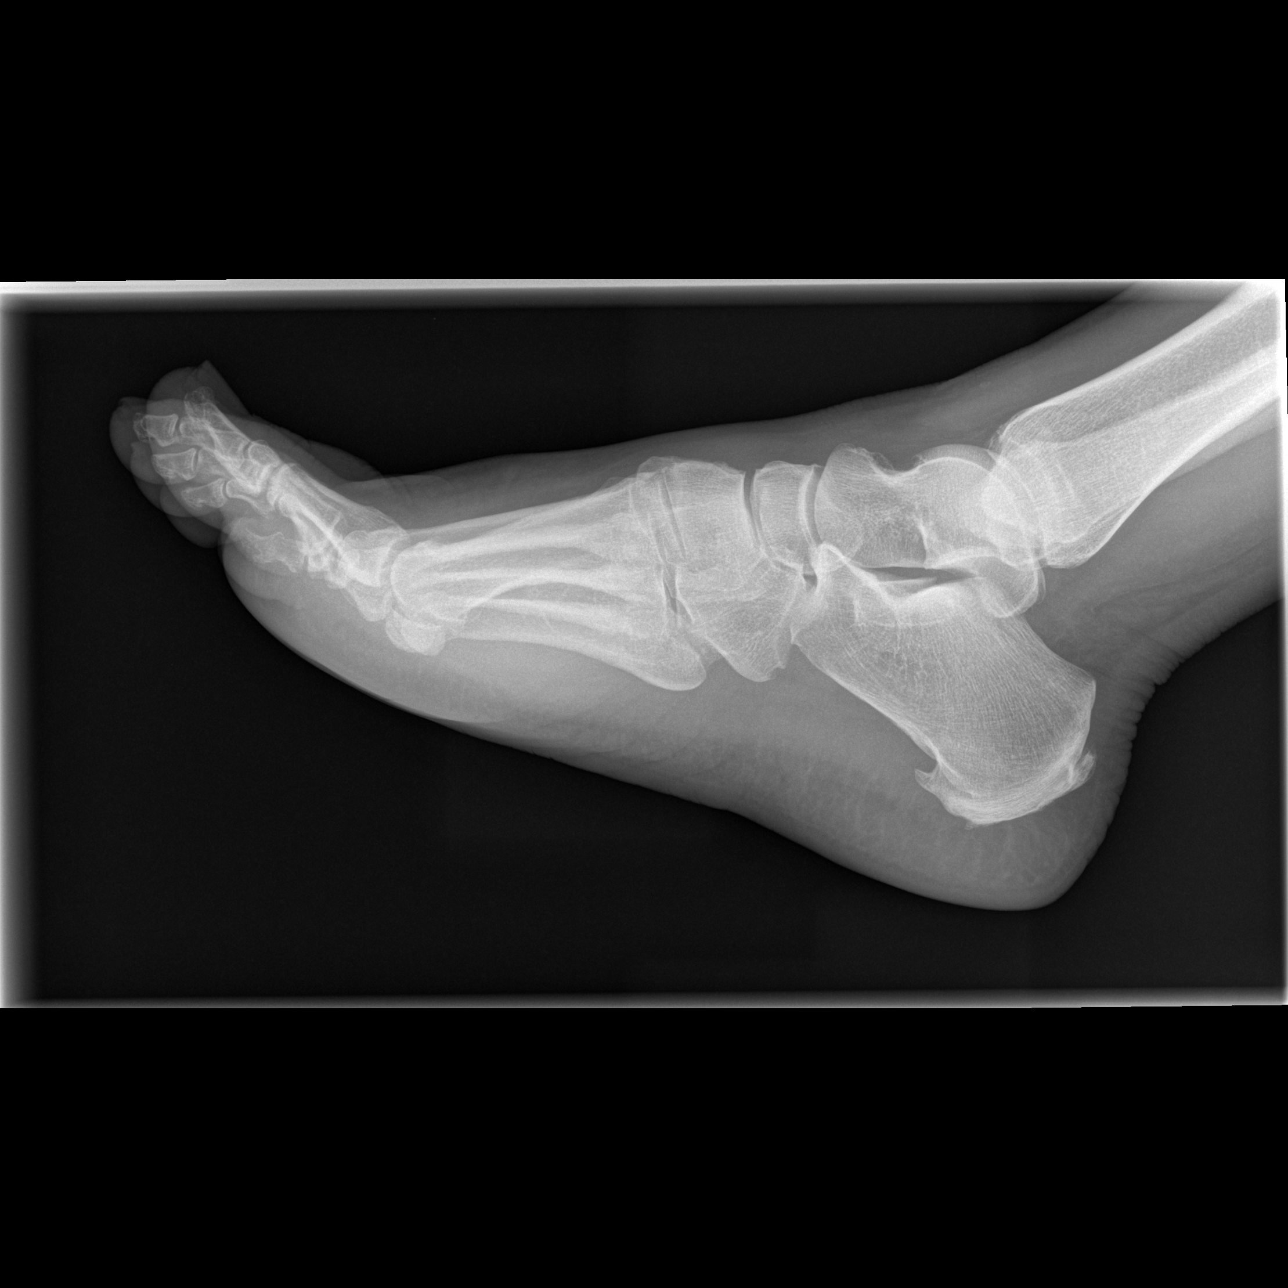

[3 of 3 positions shown; findings below may reference images not displayed]

FINDINGS: There is no evidence of acute fracture, subluxation, or
dislocation.
The Lisfranc joints are intact.

There is no evidence of radiopaque foreign body.

The joint spaces are unremarkable.

A small calcaneal spur is identified. No other focal bony lesions
are identified.
IMPRESSION: Small calcaneal spur, otherwise unremarkable exam.

## 2012-03-17 NOTE — ED Provider Notes (Signed)
History  This chart was scribed for [name], MD by Ardeen Jourdain. This patient was seen in room TR10C/TR10C and the patient's care was started at 1112 .  CSN: [id number]  Arrival date & time 03/17/12  1026   First MD Initiated Contact with Patient 03/17/12 1112      Chief Complaint  Patient presents with  . Foot Pain  . Leg Pain    Patient is a 46 y.o. female presenting with leg pain. The history is provided by the patient. No language interpreter was used.  Leg Pain     Michele Hall is a 46 y.o. female who presents to the Emergency Department complaining of foot pain located in the arch of her foot. The gradually onset, gradually worsening pain started one day ago and is aggrivated by movement and standing on the foot. She denies fever and chills.  She states the pain radiates up through the leg, but not up to her knee or back. She reports having taken took 2 tylenol yesterday with no relief. She also reports having taken one 800 mg ibuprofen this morning, again with no relief. She has a history of HTN. She is a former smoker but denies alcohol use.   Past Medical History  Diagnosis Date  . Hypertension     Past Surgical History  Procedure Date  . Cholecystectomy     Family History  Problem Relation Age of Onset  . Stroke Mother   . Kidney disease Father   . Coronary artery disease Father   . Aneurysm Father   . Hypertension Father   . Seizures Sister   . Seizures Brother   . Heart attack Maternal Grandfather   . Seizures Sister   . Cancer Sister     ovarian/uterus  . Tongue cancer Sister     History  Substance Use Topics  . Smoking status: Former Smoker    Quit date: 02/09/2002  . Smokeless tobacco: Never Used  . Alcohol Use: No    OB History    Grav Para Term Preterm Abortions TAB SAB Ect Mult Living                  Review of Systems  Constitutional: Negative for fever and chills.  Musculoskeletal:       Pain in the arch of right  foot   All other systems reviewed and are negative.    Allergies  Ibuprofen  Home Medications   Current Outpatient Rx  Name Route Sig Dispense Refill  . IBUPROFEN 200 MG PO TABS Oral Take 800 mg by mouth every 8 (eight) hours as needed. For pain    . LORATADINE 10 MG PO TABS Oral Take 10 mg by mouth daily.    Marland Kitchen OMEPRAZOLE 40 MG PO CPDR Oral Take 1 capsule (40 mg total) by mouth daily. 30 capsule 11  . SPIRONOLACTONE 25 MG PO TABS  TAKE 1 TABLET BY MOUTH DAILY 30 tablet 1    Pt needs follow up visit before further refills.  Marland Kitchen VITAMIN B-12 1000 MCG PO TABS Oral Take 1,000 mcg by mouth daily.      Triage Vitals: BP 145/99  Pulse 85  Temp 98 F (36.7 C) (Oral)  Resp 18  SpO2 98%  Physical Exam  Nursing note and vitals reviewed. Constitutional: She is oriented to person, place, and time. She appears well-developed and well-nourished. No distress.  HENT:  Head: Normocephalic and atraumatic.  Eyes: EOM are normal. Pupils are equal,  round, and reactive to light.  Neck: Normal range of motion. Neck supple. No tracheal deviation present.  Cardiovascular: Normal rate, regular rhythm and normal heart sounds.   Pulmonary/Chest: Effort normal and breath sounds normal. No respiratory distress.  Abdominal: Soft. She exhibits no distension.  Musculoskeletal: Normal range of motion. She exhibits no edema.       Right ankle non-tender or swollen; right lateral foot, plantar aspect, and metatarsale all tender and swollen, no   Neurological: She is alert and oriented to person, place, and time.  Skin: Skin is warm and dry.  Psychiatric: She has a normal mood and affect. Her behavior is normal.    ED Course  Procedures (including critical care time)  DIAGNOSTIC STUDIES: Oxygen Saturation is 98% on room air, normal by my interpretation.    COORDINATION OF CARE:  1145- Discussed treatment plan with pt at bedside and pt agreed to plan.  1203- Plan: Home Medications- Norco and ibuprofen;  Home Treatments- heat and ROM exercises; Recommended follow up- with PCP     Labs Reviewed - No data to display Dg Foot Complete Right  03/17/2012  *RADIOLOGY REPORT*  Clinical Data: Right foot pain.  RIGHT FOOT COMPLETE - 3+ VIEW  Comparison: None  Findings: There is no evidence of acute fracture, subluxation, or dislocation. The Lisfranc joints are intact.  There is no evidence of radiopaque foreign body.  The joint spaces are unremarkable.  A small calcaneal spur is identified. No other focal bony lesions are identified.  IMPRESSION: Small calcaneal spur, otherwise unremarkable exam.   Original Report Authenticated By: Rosendo Gros, M.D.      1. Foot pain   2. Calcaneal spur of right foot       MDM  Foot pain, related to calcaneal spur. Doubt fracture, tendinitis, or septic arthritis. Possible plantar fasciitis.      I personally performed the services described in this documentation, which was scribed in my presence. The recorded information has been reviewed and considered.     [name], MD 03/17/12 1430

## 2012-03-17 NOTE — ED Notes (Signed)
Pt c/o of right foot and leg pain that radiates without movement. More pain on the side of ankle. Denies tingling or numbness in toes.

## 2012-03-17 NOTE — ED Notes (Signed)
C/o pain in arch and lateral portion of left foot. No known injury.

## 2012-04-04 ENCOUNTER — Other Ambulatory Visit: Payer: Self-pay | Admitting: Family Medicine

## 2012-04-06 ENCOUNTER — Telehealth: Payer: Self-pay | Admitting: *Deleted

## 2012-04-06 NOTE — Telephone Encounter (Signed)
FYI Received a prior auth request from pharmacy on pt's omeprazole 40 mg. I called insurance for paperwork and was told that pt did not need an auth that the med claim had already been paid. Rep thinks that pharmacy might have accidentally ran the rx through twice. I faxed request back to pharmacy stating that no auth was needed.

## 2012-05-13 ENCOUNTER — Other Ambulatory Visit: Payer: Self-pay | Admitting: Family Medicine

## 2012-05-13 NOTE — Telephone Encounter (Signed)
Patient hasnt had a physical in over 1 year ok to refill?

## 2012-05-17 ENCOUNTER — Telehealth: Payer: Self-pay

## 2012-05-17 NOTE — Telephone Encounter (Signed)
[facility] faxed [place] omeprazole; spoke with Tirrany at [phone]; approval over phone 05/17/12 -05/17/13; case # [id number]. [facility] notified and rx went thru. Approval letter to follow.

## 2012-06-17 ENCOUNTER — Other Ambulatory Visit: Payer: Self-pay

## 2012-06-17 MED ORDER — SPIRONOLACTONE 25 MG PO TABS: 25.0000 mg | ORAL_TABLET | Freq: Every day | ORAL | Status: DC

## 2012-06-17 NOTE — Telephone Encounter (Signed)
Pt left v/m requesting refill for spironolactone to [facility]; pt left no contact # . Note added to refill needs to call for appt.

## 2012-06-24 ENCOUNTER — Ambulatory Visit (INDEPENDENT_AMBULATORY_CARE_PROVIDER_SITE_OTHER): Payer: BC Managed Care – PPO | Admitting: Family Medicine

## 2012-06-24 ENCOUNTER — Encounter: Payer: Self-pay | Admitting: Family Medicine

## 2012-06-24 ENCOUNTER — Ambulatory Visit: Payer: BC Managed Care – PPO | Admitting: Family Medicine

## 2012-06-24 VITALS — BP 120/60 | HR 90 | Temp 98.3°F | Ht 67.0 in | Wt 254.0 lb

## 2012-06-24 DIAGNOSIS — E785 Hyperlipidemia, unspecified: Secondary | ICD-10-CM

## 2012-06-24 DIAGNOSIS — D509 Iron deficiency anemia, unspecified: Secondary | ICD-10-CM

## 2012-06-24 DIAGNOSIS — I10 Essential (primary) hypertension: Secondary | ICD-10-CM

## 2012-06-24 DIAGNOSIS — G2581 Restless legs syndrome: Secondary | ICD-10-CM

## 2012-06-24 DIAGNOSIS — E538 Deficiency of other specified B group vitamins: Secondary | ICD-10-CM

## 2012-06-24 LAB — IBC PANEL
Iron: 39 ug/dL — ABNORMAL LOW (ref 42–145)
Saturation Ratios: 8.2 % — ABNORMAL LOW (ref 20.0–50.0)
Transferrin: 339.1 mg/dL (ref 212.0–360.0)

## 2012-06-24 LAB — COMPREHENSIVE METABOLIC PANEL
AST: 25 U/L (ref 0–37)
Alkaline Phosphatase: 51 U/L (ref 39–117)
BUN: 10 mg/dL (ref 6–23)
Calcium: 8.7 mg/dL (ref 8.4–10.5)
Chloride: 106 mEq/L (ref 96–112)
Creatinine, Ser: 0.7 mg/dL (ref 0.4–1.2)

## 2012-06-24 LAB — CBC WITH DIFFERENTIAL/PLATELET
Eosinophils Relative: 4.8 % (ref 0.0–5.0)
HCT: 30.4 % — ABNORMAL LOW (ref 36.0–46.0)
Lymphocytes Relative: 22.2 % (ref 12.0–46.0)
Monocytes Relative: 12.8 % — ABNORMAL HIGH (ref 3.0–12.0)
Neutrophils Relative %: 59.6 % (ref 43.0–77.0)
Platelets: 272 10*3/uL (ref 150.0–400.0)
WBC: 6.4 10*3/uL (ref 4.5–10.5)

## 2012-06-24 LAB — LIPID PANEL
Cholesterol: 135 mg/dL (ref 0–200)
HDL: 43.7 mg/dL (ref >39.00)
Total CHOL/HDL Ratio: 3
Triglycerides: 110 mg/dL (ref 0.0–149.0)

## 2012-06-24 LAB — FERRITIN: Ferritin: 4 ng/mL — ABNORMAL LOW (ref 10.0–291.0)

## 2012-06-24 MED ORDER — ROPINIROLE HCL 1 MG PO TABS: ORAL_TABLET | ORAL | Status: DC

## 2012-06-24 MED ORDER — SPIRONOLACTONE 25 MG PO TABS: 25.0000 mg | ORAL_TABLET | Freq: Every day | ORAL | Status: DC

## 2012-06-24 NOTE — Progress Notes (Signed)
Subjective:    Patient ID: Michele Hall, female    DOB: Oct 27, 1965, 47 y.o.   MRN: [id number]  HPI  47 year old female pt presents for follow up.  Hx of restless leg off and on.  Has nighttime leg pain. Legs have been jerking at ngiht. Legs feel better if she gets up and walks around.  Has been b12 def in past.  Has been anemic in past.  Has used advil and tylenol in past.  Worse during menses.  Has been taking B12 and iron off and on.  Drinking water at noght tom help rehydrate.   She has been having some sinus drainage, cough, nasal congestion x 1 week.  No fever. Some postnasal drip.  No face pain. Mild ear pain.   Due for labs.   Hypertension: well controlled on spirnolactone daily   Using medication without problems or lightheadedness:  None Chest pain with exertion:None Edema:None Short of breath:None Average home BPs: not checking Other issues:  Elevated Cholesterol: Due for re-eval. On no medicaiton. Lab Results  Component Value Date   CHOL 159 01/22/2010   HDL 39.80 01/22/2010   LDLCALC 89 01/22/2010   TRIG 152.0* 01/22/2010   CHOLHDL 4 01/22/2010   Diet compliance: Moderate, avoids junk food but does eat out more than she should Exercise: Manual job Other complaints:   Review of Systems  Constitutional: Negative for fever and fatigue.  HENT: Negative for ear pain.   Eyes: Negative for pain.  Respiratory: Negative for chest tightness and shortness of breath.   Cardiovascular: Negative for chest pain and palpitations.  Gastrointestinal: Negative for abdominal pain.  Genitourinary: Negative for dysuria.       Objective:   Physical Exam  Constitutional: Vital signs are normal. She appears well-developed and well-nourished. She is cooperative.  Non-toxic appearance. She does not appear ill. No distress.       obese  HENT:  Head: Normocephalic.  Right Ear: Hearing, tympanic membrane, external ear and ear canal normal. Tympanic membrane is not  erythematous, not retracted and not bulging.  Left Ear: Hearing, tympanic membrane, external ear and ear canal normal. Tympanic membrane is not erythematous, not retracted and not bulging.  Nose: No mucosal edema or rhinorrhea. Right sinus exhibits no maxillary sinus tenderness and no frontal sinus tenderness. Left sinus exhibits no maxillary sinus tenderness and no frontal sinus tenderness.  Mouth/Throat: Uvula is midline, oropharynx is clear and moist and mucous membranes are normal.  Eyes: Conjunctivae normal, EOM and lids are normal. Pupils are equal, round, and reactive to light. No foreign bodies found.  Neck: Trachea normal and normal range of motion. Neck supple. Carotid bruit is not present. No mass and no thyromegaly present.  Cardiovascular: Normal rate, regular rhythm, S1 normal, S2 normal, normal heart sounds, intact distal pulses and normal pulses.  Exam reveals no gallop and no friction rub.   No murmur heard. Pulmonary/Chest: Effort normal and breath sounds normal. Not tachypneic. No respiratory distress. She has no decreased breath sounds. She has no wheezes. She has no rhonchi. She has no rales.  Abdominal: Soft. Normal appearance and bowel sounds are normal. There is no tenderness.  Musculoskeletal:       Lumbar back: Normal.  Neurological: She is alert.  Skin: Skin is warm, dry and intact. No rash noted.  Psychiatric: Her speech is normal and behavior is normal. Judgment and thought content normal. Her mood appears not anxious. Cognition and memory are normal. She does not exhibit  a depressed mood.          Assessment & Plan:

## 2012-06-24 NOTE — Assessment & Plan Note (Signed)
Due for re-eval. Encouraged exercise, weight loss, healthy eating habits.  

## 2012-06-24 NOTE — Assessment & Plan Note (Signed)
Eval with labs for secondary cause. Start requip titrate up as needed.

## 2012-06-24 NOTE — Patient Instructions (Addendum)
Return for CPX in next 2-3 months. Stop at the lab on your way out.  Increase water, leg stretching and exercise. Work on weight loss. Start ropinirole for restless leg at night. Increase as needed. Start nasal saline 2-3  X per day.

## 2012-06-24 NOTE — Assessment & Plan Note (Signed)
Due for re-eval. 

## 2012-06-24 NOTE — Assessment & Plan Note (Signed)
Encouraged exercise, weight loss, healthy eating habits. Well controlled. Continue current medication.  

## 2012-06-29 ENCOUNTER — Telehealth: Payer: Self-pay | Admitting: *Deleted

## 2012-06-29 MED ORDER — VITAMIN D (ERGOCALCIFEROL) 1.25 MG (50000 UNIT) PO CAPS: 50000.0000 [IU] | ORAL_CAPSULE | ORAL | Status: AC

## 2012-06-29 NOTE — Telephone Encounter (Signed)
Pt returned call, she had received a call from someone in the office. I gave pt her lab results and instructed I would call in her vitamin D rx. She wanted me to let you know that she cannot take the Requip. She has had a headache since she started it and she says it makes her feel "sick". Please advise if you want to switch to something else.

## 2012-06-29 NOTE — Telephone Encounter (Signed)
I will forward to her PCP who is in town

## 2012-06-29 NOTE — Telephone Encounter (Signed)
Pt called, she is unable to take Requip bc it is causing her severe HA and N/V.  She states that she needs something soon because she is not sleeping at all without medication.  Dr. Ermalene Searing is out of office until Friday, will route to Dr. Patsy Lager.

## 2012-06-30 MED ORDER — PRAMIPEXOLE DIHYDROCHLORIDE 0.125 MG PO TABS: ORAL_TABLET | ORAL | Status: DC

## 2012-06-30 NOTE — Telephone Encounter (Signed)
Sent in prescription for a different med. Is she also treating the iron def and vit deficiency?  If not able to tolerate this new med we can try lower dose of requip

## 2012-06-30 NOTE — Telephone Encounter (Signed)
Pt called back and Patient notified as instructed by telephone. Pt said she tried cutting Requip in half and still caused pt to have h/a and nausea and did not help restless leg. Pt started taking Vit D 50,000 units on 06/29/12 and plans to take one weekly for total of 12 weeks. Pt will pick up Mirapex today and call back with any problems if needed.

## 2012-06-30 NOTE — Telephone Encounter (Signed)
Left message for patient to return my call and also left all recommendation is detail on her personal cell phone

## 2012-07-01 ENCOUNTER — Telehealth: Payer: Self-pay | Admitting: *Deleted

## 2012-07-01 DIAGNOSIS — N920 Excessive and frequent menstruation with regular cycle: Secondary | ICD-10-CM

## 2012-07-01 NOTE — Telephone Encounter (Signed)
Notify pot that also iron was low.. Take 325 mg BID of ferrous sulfate if tolerated.

## 2012-07-01 NOTE — Telephone Encounter (Signed)
Spoke with patient about her results and her iron being low and patient has been on her period everyday for a while. Patient would like referral to a gyn. Patient wants to go to [place] and Fridays are best or late any afternoon.

## 2012-07-01 NOTE — Telephone Encounter (Signed)
Patient advised.

## 2012-07-01 NOTE — Telephone Encounter (Signed)
Left message for patient to return call.

## 2012-07-08 ENCOUNTER — Ambulatory Visit (INDEPENDENT_AMBULATORY_CARE_PROVIDER_SITE_OTHER): Payer: BC Managed Care – PPO | Admitting: Obstetrics & Gynecology

## 2012-07-08 ENCOUNTER — Encounter: Payer: Self-pay | Admitting: Obstetrics & Gynecology

## 2012-07-08 VITALS — BP 129/83 | HR 97 | Ht 67.0 in | Wt 251.0 lb

## 2012-07-08 DIAGNOSIS — N921 Excessive and frequent menstruation with irregular cycle: Secondary | ICD-10-CM

## 2012-07-08 DIAGNOSIS — Z01419 Encounter for gynecological examination (general) (routine) without abnormal findings: Secondary | ICD-10-CM

## 2012-07-08 DIAGNOSIS — Z124 Encounter for screening for malignant neoplasm of cervix: Secondary | ICD-10-CM

## 2012-07-08 DIAGNOSIS — Z1151 Encounter for screening for human papillomavirus (HPV): Secondary | ICD-10-CM

## 2012-07-08 DIAGNOSIS — Z113 Encounter for screening for infections with a predominantly sexual mode of transmission: Secondary | ICD-10-CM

## 2012-07-08 DIAGNOSIS — D509 Iron deficiency anemia, unspecified: Secondary | ICD-10-CM

## 2012-07-08 DIAGNOSIS — N920 Excessive and frequent menstruation with regular cycle: Secondary | ICD-10-CM

## 2012-07-08 DIAGNOSIS — R87610 Atypical squamous cells of undetermined significance on cytologic smear of cervix (ASC-US): Secondary | ICD-10-CM | POA: Insufficient documentation

## 2012-07-08 MED ORDER — MEDROXYPROGESTERONE ACETATE 10 MG PO TABS: 20.0000 mg | ORAL_TABLET | Freq: Every day | ORAL | Status: DC

## 2012-07-08 NOTE — Patient Instructions (Signed)
Preventive Care for Adults, Female A healthy lifestyle and preventive care can promote health and wellness. Preventive health guidelines for women include the following key practices.  A routine yearly physical is a good way to check with your caregiver about your health and preventive screening. It is a chance to share any concerns and updates on your health, and to receive a thorough exam.  Visit your dentist for a routine exam and preventive care every 6 months. Brush your teeth twice a day and floss once a day. Good oral hygiene prevents tooth decay and gum disease.  The frequency of eye exams is based on your age, health, family medical history, use of contact lenses, and other factors. Follow your caregiver's recommendations for frequency of eye exams.  Eat a healthy diet. Foods like vegetables, fruits, whole grains, low-fat dairy products, and lean protein foods contain the nutrients you need without too many calories. Decrease your intake of foods high in solid fats, added sugars, and salt. Eat the right amount of calories for you.Get information about a proper diet from your caregiver, if necessary.  Regular physical exercise is one of the most important things you can do for your health. Most adults should get at least 150 minutes of moderate-intensity exercise (any activity that increases your heart rate and causes you to sweat) each week. In addition, most adults need muscle-strengthening exercises on 2 or more days a week.  Maintain a healthy weight. The body mass index (BMI) is a screening tool to identify possible weight problems. It provides an estimate of body fat based on height and weight. Your caregiver can help determine your BMI, and can help you achieve or maintain a healthy weight.For adults 20 years and older:  A BMI below 18.5 is considered underweight.  A BMI of 18.5 to 24.9 is normal.  A BMI of 25 to 29.9 is considered overweight.  A BMI of 30 and above is  considered obese.  Maintain normal blood lipids and cholesterol levels by exercising and minimizing your intake of saturated fat. Eat a balanced diet with plenty of fruit and vegetables. Blood tests for lipids and cholesterol should begin at age 20 and be repeated every 5 years. If your lipid or cholesterol levels are high, you are over 50, or you are at high risk for heart disease, you may need your cholesterol levels checked more frequently.Ongoing high lipid and cholesterol levels should be treated with medicines if diet and exercise are not effective.  If you smoke, find out from your caregiver how to quit. If you do not use tobacco, do not start.  If you are pregnant, do not drink alcohol. If you are breastfeeding, be very cautious about drinking alcohol. If you are not pregnant and choose to drink alcohol, do not exceed 1 drink per day. One drink is considered to be 12 ounces (355 mL) of beer, 5 ounces (148 mL) of wine, or 1.5 ounces (44 mL) of liquor.  Avoid use of street drugs. Do not share needles with anyone. Ask for help if you need support or instructions about stopping the use of drugs.  High blood pressure causes heart disease and increases the risk of stroke. Your blood pressure should be checked at least every 1 to 2 years. Ongoing high blood pressure should be treated with medicines if weight loss and exercise are not effective.  If you are 55 to 46 years old, ask your caregiver if you should take aspirin to prevent strokes.  Diabetes   screening involves taking a blood sample to check your fasting blood sugar level. This should be done once every 3 years, after age 45, if you are within normal weight and without risk factors for diabetes. Testing should be considered at a younger age or be carried out more frequently if you are overweight and have at least 1 risk factor for diabetes.  Breast cancer screening is essential preventive care for women. You should practice "breast  self-awareness." This means understanding the normal appearance and feel of your breasts and may include breast self-examination. Any changes detected, no matter how small, should be reported to a caregiver. Women in their 20s and 30s should have a clinical breast exam (CBE) by a caregiver as part of a regular health exam every 1 to 3 years. After age 40, women should have a CBE every year. Starting at age 40, women should consider having a mammography (breast X-ray test) every year. Women who have a family history of breast cancer should talk to their caregiver about genetic screening. Women at a high risk of breast cancer should talk to their caregivers about having magnetic resonance imaging (MRI) and a mammography every year.  The Pap test is a screening test for cervical cancer. A Pap test can show cell changes on the cervix that might become cervical cancer if left untreated. A Pap test is a procedure in which cells are obtained and examined from the lower end of the uterus (cervix).  Women should have a Pap test starting at age 21.  Between ages 21 and 29, Pap tests should be repeated every 2 years.  Beginning at age 30, you should have a Pap test every 3 years as long as the past 3 Pap tests have been normal.  Some women have medical problems that increase the chance of getting cervical cancer. Talk to your caregiver about these problems. It is especially important to talk to your caregiver if a new problem develops soon after your last Pap test. In these cases, your caregiver may recommend more frequent screening and Pap tests.  The above recommendations are the same for women who have or have not gotten the vaccine for human papillomavirus (HPV).  If you had a hysterectomy for a problem that was not cancer or a condition that could lead to cancer, then you no longer need Pap tests. Even if you no longer need a Pap test, a regular exam is a good idea to make sure no other problems are  starting.  If you are between ages 65 and 70, and you have had normal Pap tests going back 10 years, you no longer need Pap tests. Even if you no longer need a Pap test, a regular exam is a good idea to make sure no other problems are starting.  If you have had past treatment for cervical cancer or a condition that could lead to cancer, you need Pap tests and screening for cancer for at least 20 years after your treatment.  If Pap tests have been discontinued, risk factors (such as a new sexual partner) need to be reassessed to determine if screening should be resumed.  The HPV test is an additional test that may be used for cervical cancer screening. The HPV test looks for the virus that can cause the cell changes on the cervix. The cells collected during the Pap test can be tested for HPV. The HPV test could be used to screen women aged 30 years and older, and should   be used in women of any age who have unclear Pap test results. After the age of 30, women should have HPV testing at the same frequency as a Pap test.  Colorectal cancer can be detected and often prevented. Most routine colorectal cancer screening begins at the age of 50 and continues through age 75. However, your caregiver may recommend screening at an earlier age if you have risk factors for colon cancer. On a yearly basis, your caregiver may provide home test kits to check for hidden blood in the stool. Use of a small camera at the end of a tube, to directly examine the colon (sigmoidoscopy or colonoscopy), can detect the earliest forms of colorectal cancer. Talk to your caregiver about this at age 50, when routine screening begins. Direct examination of the colon should be repeated every 5 to 10 years through age 75, unless early forms of pre-cancerous polyps or small growths are found.  Hepatitis C blood testing is recommended for all people born from 1945 through 1965 and any individual with known risks for hepatitis C.  Practice  safe sex. Use condoms and avoid high-risk sexual practices to reduce the spread of sexually transmitted infections (STIs). STIs include gonorrhea, chlamydia, syphilis, trichomonas, herpes, HPV, and human immunodeficiency virus (HIV). Herpes, HIV, and HPV are viral illnesses that have no cure. They can result in disability, cancer, and death. Sexually active women aged 25 and younger should be checked for chlamydia. Older women with new or multiple partners should also be tested for chlamydia. Testing for other STIs is recommended if you are sexually active and at increased risk.  Osteoporosis is a disease in which the bones lose minerals and strength with aging. This can result in serious bone fractures. The risk of osteoporosis can be identified using a bone density scan. Women ages 65 and over and women at risk for fractures or osteoporosis should discuss screening with their caregivers. Ask your caregiver whether you should take a calcium supplement or vitamin D to reduce the rate of osteoporosis.  Menopause can be associated with physical symptoms and risks. Hormone replacement therapy is available to decrease symptoms and risks. You should talk to your caregiver about whether hormone replacement therapy is right for you.  Use sunscreen with sun protection factor (SPF) of 30 or more. Apply sunscreen liberally and repeatedly throughout the day. You should seek shade when your shadow is shorter than you. Protect yourself by wearing long sleeves, pants, a wide-brimmed hat, and sunglasses year round, whenever you are outdoors.  Once a month, do a whole body skin exam, using a mirror to look at the skin on your back. Notify your caregiver of new moles, moles that have irregular borders, moles that are larger than a pencil eraser, or moles that have changed in shape or color.  Stay current with required immunizations.  Influenza. You need a dose every fall (or winter). The composition of the flu vaccine  changes each year, so being vaccinated once is not enough.  Pneumococcal polysaccharide. You need 1 to 2 doses if you smoke cigarettes or if you have certain chronic medical conditions. You need 1 dose at age 65 (or older) if you have never been vaccinated.  Tetanus, diphtheria, pertussis (Tdap, Td). Get 1 dose of Tdap vaccine if you are younger than age 65, are over 65 and have contact with an infant, are a healthcare worker, are pregnant, or simply want to be protected from whooping cough. After that, you need a Td   booster dose every 10 years. Consult your caregiver if you have not had at least 3 tetanus and diphtheria-containing shots sometime in your life or have a deep or dirty wound.  HPV. You need this vaccine if you are a woman age 26 or younger. The vaccine is given in 3 doses over 6 months.  Measles, mumps, rubella (MMR). You need at least 1 dose of MMR if you were born in 1957 or later. You may also need a second dose.  Meningococcal. If you are age 19 to 21 and a first-year college student living in a residence hall, or have one of several medical conditions, you need to get vaccinated against meningococcal disease. You may also need additional booster doses.  Zoster (shingles). If you are age 60 or older, you should get this vaccine.  Varicella (chickenpox). If you have never had chickenpox or you were vaccinated but received only 1 dose, talk to your caregiver to find out if you need this vaccine.  Hepatitis A. You need this vaccine if you have a specific risk factor for hepatitis A virus infection or you simply wish to be protected from this disease. The vaccine is usually given as 2 doses, 6 to 18 months apart.  Hepatitis B. You need this vaccine if you have a specific risk factor for hepatitis B virus infection or you simply wish to be protected from this disease. The vaccine is given in 3 doses, usually over 6 months. Preventive Services / Frequency Ages 19 to 39  Blood  pressure check.** / Every 1 to 2 years.  Lipid and cholesterol check.** / Every 5 years beginning at age 20.  Clinical breast exam.** / Every 3 years for women in their 20s and 30s.  Pap test.** / Every 2 years from ages 21 through 29. Every 3 years starting at age 30 through age 65 or 70 with a history of 3 consecutive normal Pap tests.  HPV screening.** / Every 3 years from ages 30 through ages 65 to 70 with a history of 3 consecutive normal Pap tests.  Hepatitis C blood test.** / For any individual with known risks for hepatitis C.  Skin self-exam. / Monthly.  Influenza immunization.** / Every year.  Pneumococcal polysaccharide immunization.** / 1 to 2 doses if you smoke cigarettes or if you have certain chronic medical conditions.  Tetanus, diphtheria, pertussis (Tdap, Td) immunization. / A one-time dose of Tdap vaccine. After that, you need a Td booster dose every 10 years.  HPV immunization. / 3 doses over 6 months, if you are 26 and younger.  Measles, mumps, rubella (MMR) immunization. / You need at least 1 dose of MMR if you were born in 1957 or later. You may also need a second dose.  Meningococcal immunization. / 1 dose if you are age 19 to 21 and a first-year college student living in a residence hall, or have one of several medical conditions, you need to get vaccinated against meningococcal disease. You may also need additional booster doses.  Varicella immunization.** / Consult your caregiver.  Hepatitis A immunization.** / Consult your caregiver. 2 doses, 6 to 18 months apart.  Hepatitis B immunization.** / Consult your caregiver. 3 doses usually over 6 months. Ages 40 to 64  Blood pressure check.** / Every 1 to 2 years.  Lipid and cholesterol check.** / Every 5 years beginning at age 20.  Clinical breast exam.** / Every year after age 40.  Mammogram.** / Every year beginning at age 40   and continuing for as long as you are in good health. Consult with your  caregiver.  Pap test.** / Every 3 years starting at age 30 through age 65 or 70 with a history of 3 consecutive normal Pap tests.  HPV screening.** / Every 3 years from ages 30 through ages 65 to 70 with a history of 3 consecutive normal Pap tests.  Fecal occult blood test (FOBT) of stool. / Every year beginning at age 50 and continuing until age 75. You may not need to do this test if you get a colonoscopy every 10 years.  Flexible sigmoidoscopy or colonoscopy.** / Every 5 years for a flexible sigmoidoscopy or every 10 years for a colonoscopy beginning at age 50 and continuing until age 75.  Hepatitis C blood test.** / For all people born from 1945 through 1965 and any individual with known risks for hepatitis C.  Skin self-exam. / Monthly.  Influenza immunization.** / Every year.  Pneumococcal polysaccharide immunization.** / 1 to 2 doses if you smoke cigarettes or if you have certain chronic medical conditions.  Tetanus, diphtheria, pertussis (Tdap, Td) immunization.** / A one-time dose of Tdap vaccine. After that, you need a Td booster dose every 10 years.  Measles, mumps, rubella (MMR) immunization. / You need at least 1 dose of MMR if you were born in 1957 or later. You may also need a second dose.  Varicella immunization.** / Consult your caregiver.  Meningococcal immunization.** / Consult your caregiver.  Hepatitis A immunization.** / Consult your caregiver. 2 doses, 6 to 18 months apart.  Hepatitis B immunization.** / Consult your caregiver. 3 doses, usually over 6 months. Ages 65 and over  Blood pressure check.** / Every 1 to 2 years.  Lipid and cholesterol check.** / Every 5 years beginning at age 20.  Clinical breast exam.** / Every year after age 40.  Mammogram.** / Every year beginning at age 40 and continuing for as long as you are in good health. Consult with your caregiver.  Pap test.** / Every 3 years starting at age 30 through age 65 or 70 with a 3  consecutive normal Pap tests. Testing can be stopped between 65 and 70 with 3 consecutive normal Pap tests and no abnormal Pap or HPV tests in the past 10 years.  HPV screening.** / Every 3 years from ages 30 through ages 65 or 70 with a history of 3 consecutive normal Pap tests. Testing can be stopped between 65 and 70 with 3 consecutive normal Pap tests and no abnormal Pap or HPV tests in the past 10 years.  Fecal occult blood test (FOBT) of stool. / Every year beginning at age 50 and continuing until age 75. You may not need to do this test if you get a colonoscopy every 10 years.  Flexible sigmoidoscopy or colonoscopy.** / Every 5 years for a flexible sigmoidoscopy or every 10 years for a colonoscopy beginning at age 50 and continuing until age 75.  Hepatitis C blood test.** / For all people born from 1945 through 1965 and any individual with known risks for hepatitis C.  Osteoporosis screening.** / A one-time screening for women ages 65 and over and women at risk for fractures or osteoporosis.  Skin self-exam. / Monthly.  Influenza immunization.** / Every year.  Pneumococcal polysaccharide immunization.** / 1 dose at age 65 (or older) if you have never been vaccinated.  Tetanus, diphtheria, pertussis (Tdap, Td) immunization. / A one-time dose of Tdap vaccine if you are over   65 and have contact with an infant, are a healthcare worker, or simply want to be protected from whooping cough. After that, you need a Td booster dose every 10 years.  Varicella immunization.** / Consult your caregiver.  Meningococcal immunization.** / Consult your caregiver.  Hepatitis A immunization.** / Consult your caregiver. 2 doses, 6 to 18 months apart.  Hepatitis B immunization.** / Check with your caregiver. 3 doses, usually over 6 months. ** Family history and personal history of risk and conditions may change your caregiver's recommendations. Document Released: 07/21/2001 Document Revised: 08/17/2011  Document Reviewed: 10/20/2010 [employer] Patient Information 2013 [employer].  

## 2012-07-08 NOTE — Progress Notes (Signed)
Subjective:     Michele Hall is a 47 y.o. G31P2002 female and is here for a comprehensive gynecologic physical exam and evaluation of abnormal uterine bleeding (AUB). The patient reports having AUB x 1 year now, has two or more episodes of bleeding in one month and the bleeding is very heavy making her use tampons and pads concurrently.  She has a long history of anemia, especially iron deficiency anemia, and feels the AUB is contributing to this.  She also reports occasional hot flashes and night sweats. She denies any other GYN symptoms.  History   Social History  . Marital Status: Married    Spouse Name: N/A    Number of Children: 2  . Years of Education: N/A   Occupational History  . house cleaner/ former bus driver    Social History Main Topics  . Smoking status: Former Smoker    Quit date: 02/09/2002  . Smokeless tobacco: Never Used  . Alcohol Use: No  . Drug Use: No  . Sexually Active: Yes -- Female partner(s)    Birth Control/ Protection: Surgical   Other Topics Concern  . Not on file   Social History Narrative  . No narrative on file   Health Maintenance  Topic Date Due  . Influenza Vaccine  02/07/2012  . Tetanus/tdap  06/08/2012  . Pap Smear  10/26/2013    The following portions of the patient's history were reviewed and updated as appropriate: allergies, current medications, past family history, past medical history, past social history, past surgical history and problem list.  Review of Systems Pertinent items are noted in HPI.   Objective:   BP 129/83  Pulse 97  Ht 5\' 7"  (1.702 m)  Wt 251 lb (113.853 kg)  BMI 39.31 kg/m2  LMP 07/08/2012 GENERAL: Well-developed, well-nourished female in no acute distress.  HEENT: Normocephalic, atraumatic. Sclerae anicteric.  NECK: Supple. Normal thyroid.  LUNGS: Clear to auscultation bilaterally.  HEART: Regular rate and rhythm. BREASTS: Symmetric in size. No masses, skin changes, nipple drainage, or  lymphadenopathy. ABDOMEN: Soft, obese, nontender, nondistended. No organomegaly. PELVIC: Normal external female genitalia. Vagina is pink and rugated.  Brown discharge. Normal cervix contour. Pap smear obtained. Uterus is normal in size. No adnexal mass or tenderness.  EXTREMITIES: No cyanosis, clubbing, or edema, 2+ distal pulses.  ENDOMETRIAL BIOPSY     The indications for endometrial biopsy were reviewed.   Risks of the biopsy including cramping, bleeding, infection, uterine perforation, inadequate specimen and need for additional procedures  were discussed. The patient states she understands and agrees to undergo procedure today. Consent was signed. Time out was performed. Urine HCG was negative.  A sterile speculum was placed in the patient's vagina and the cervix was prepped with Betadine. A single-toothed tenaculum was placed on the anterior lip of the cervix to stabilize it. The 3 mm pipelle was introduced into the endometrial cavity without difficulty to a depth of 10 cm, and a moderate amount of tissue was obtained and sent to pathology. The instruments were removed from the patient's vagina. Minimal bleeding from the cervix was noted. The patient tolerated the procedure well.    Assessment:    Healthy female exam.  Abnormal uterine bleeding s/p endometrial biopsy   Plan:   Last Hgb was 9.8 and TSH was normal, no further labs needed today Pelvic ultrasound ordered, follow up endometrial biopsy and pap smear Mammogram ordered Provera prescribed for AUB Patient will return in two weeks to discuss results and  further management

## 2012-07-13 ENCOUNTER — Encounter: Payer: Self-pay | Admitting: Obstetrics & Gynecology

## 2012-07-22 ENCOUNTER — Ambulatory Visit (HOSPITAL_COMMUNITY): Payer: BC Managed Care – PPO

## 2012-08-03 ENCOUNTER — Ambulatory Visit (HOSPITAL_COMMUNITY)
Admission: RE | Admit: 2012-08-03 | Discharge: 2012-08-03 | Disposition: A | Discharge status: Home / Self Care | Payer: BC Managed Care – PPO | Source: Ambulatory Visit | Attending: Obstetrics & Gynecology | Admitting: Obstetrics & Gynecology

## 2012-08-03 ENCOUNTER — Telehealth: Payer: Self-pay | Admitting: *Deleted

## 2012-08-03 ENCOUNTER — Encounter: Payer: Self-pay | Admitting: Obstetrics & Gynecology

## 2012-08-03 DIAGNOSIS — N809 Endometriosis, unspecified: Secondary | ICD-10-CM

## 2012-08-03 DIAGNOSIS — N920 Excessive and frequent menstruation with regular cycle: Secondary | ICD-10-CM | POA: Insufficient documentation

## 2012-08-03 DIAGNOSIS — D259 Leiomyoma of uterus, unspecified: Secondary | ICD-10-CM | POA: Insufficient documentation

## 2012-08-03 DIAGNOSIS — Z1231 Encounter for screening mammogram for malignant neoplasm of breast: Secondary | ICD-10-CM | POA: Insufficient documentation

## 2012-08-03 DIAGNOSIS — D219 Benign neoplasm of connective and other soft tissue, unspecified: Secondary | ICD-10-CM | POA: Insufficient documentation

## 2012-08-03 DIAGNOSIS — N8003 Adenomyosis of the uterus: Secondary | ICD-10-CM | POA: Insufficient documentation

## 2012-08-03 IMAGING — US US PELVIS COMPLETE
1 series · 13 of 25 positions shown · non-contrast
Comparison: [DATE]

CLINICAL DATA: Menometrorrhagia.  LMP [DATE].



[Series 1: us pelvis complete · 13 of 54 slices shown]
[im 1/54]
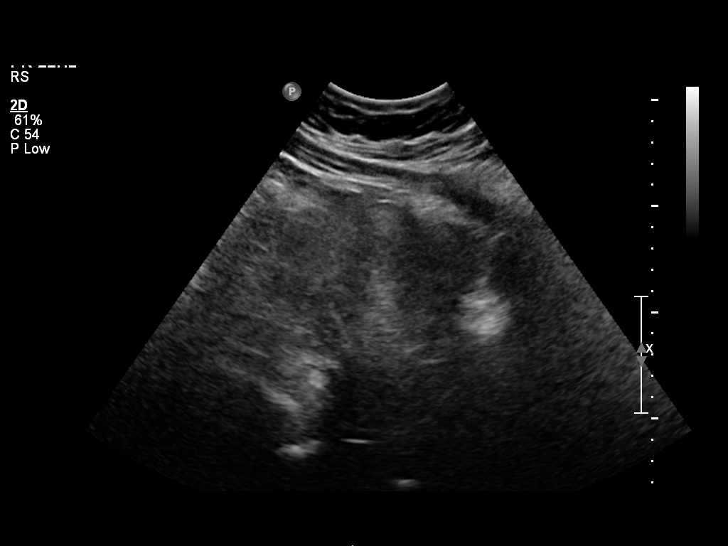
[im 5/54]
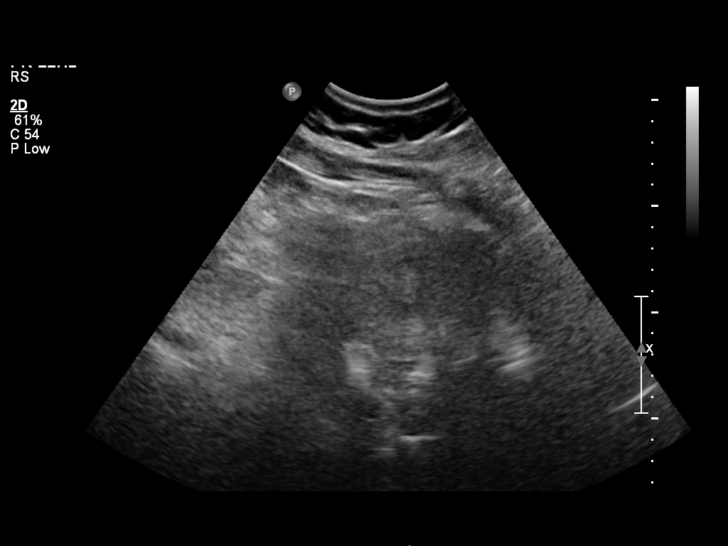
[im 9/54]
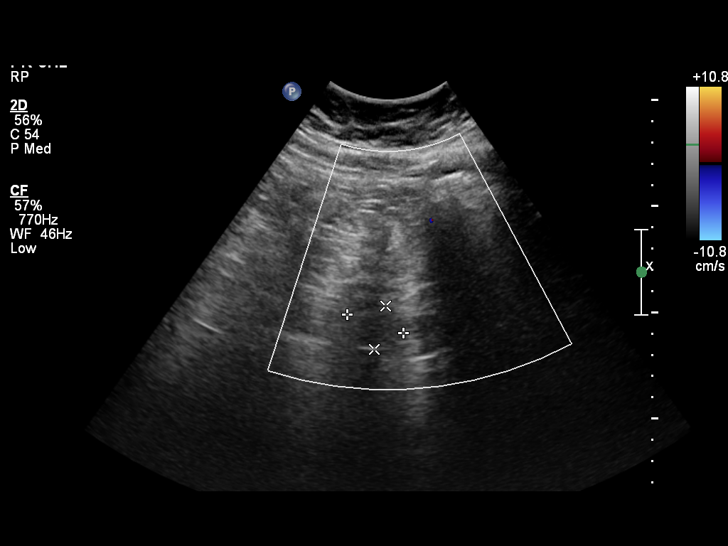
[im 14/54]
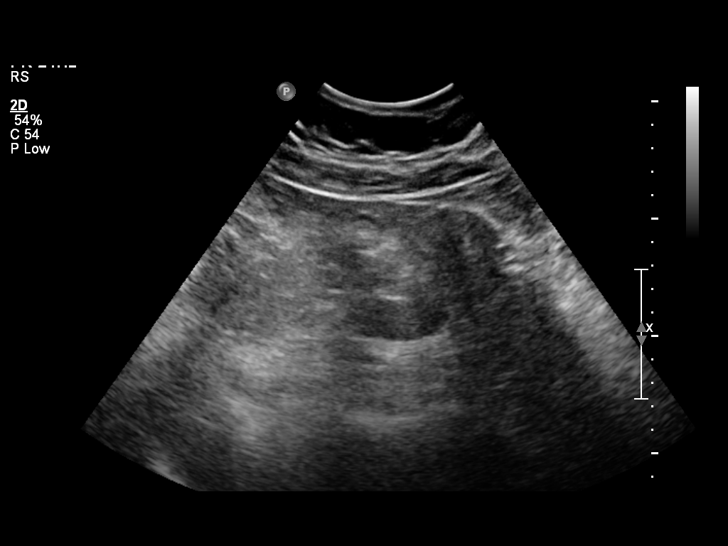
[im 18/54]
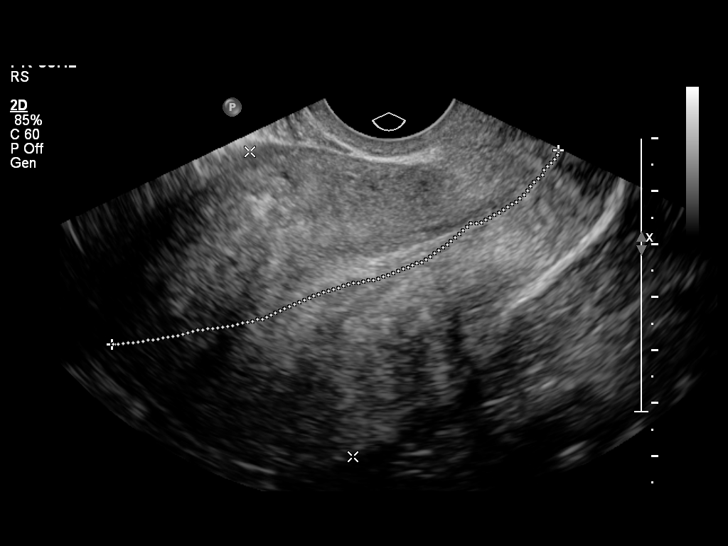
[im 23/54]
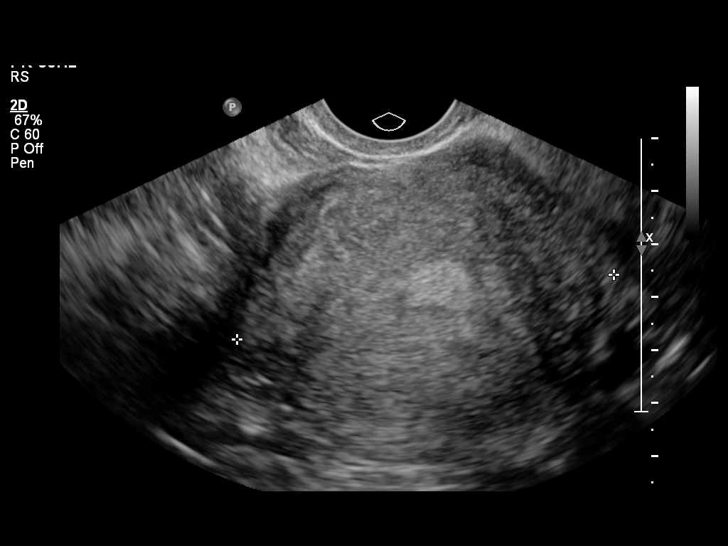
[im 27/54]
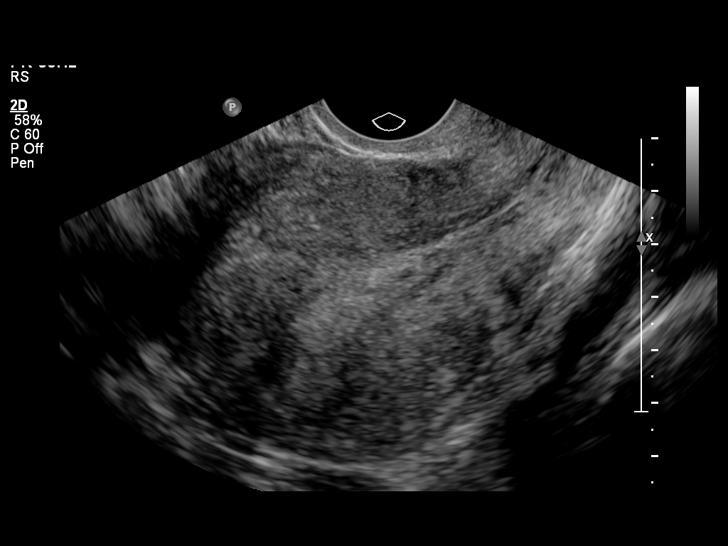
[im 31/54]
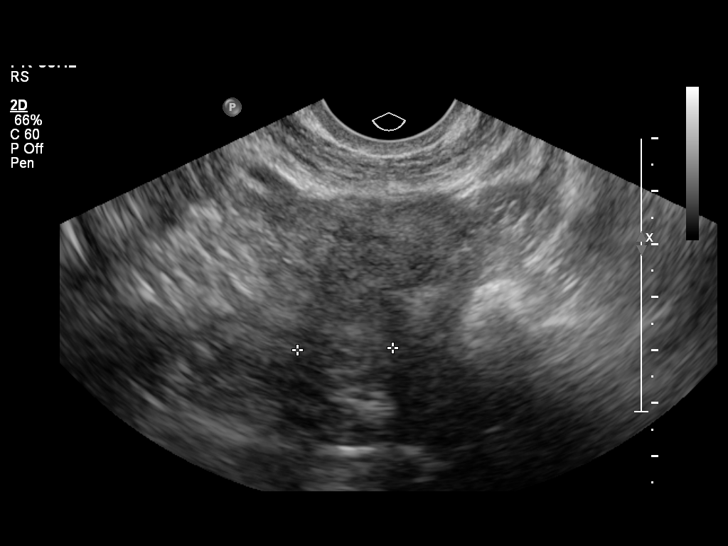
[im 36/54]
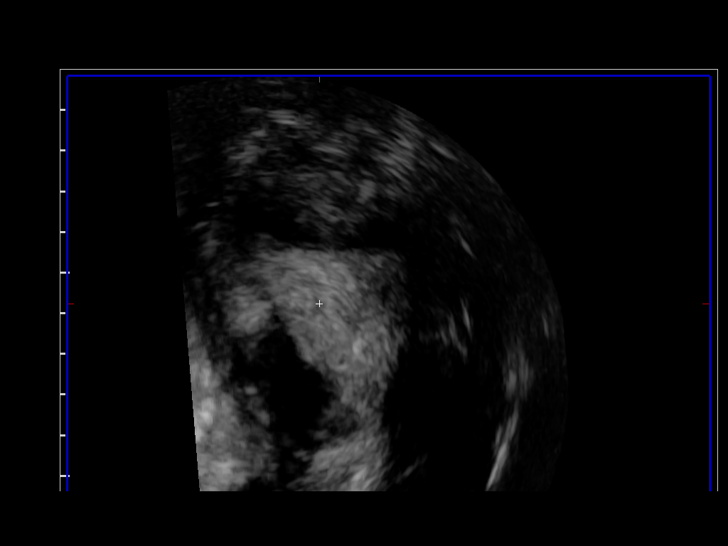
[im 40/54]
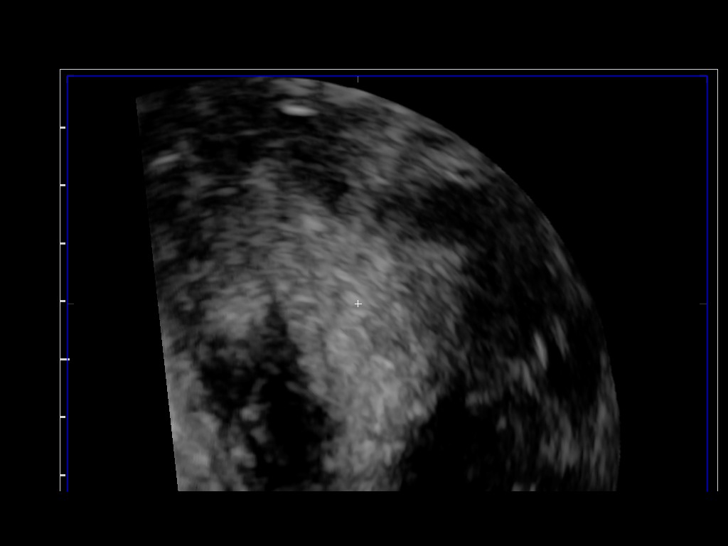
[im 45/54]
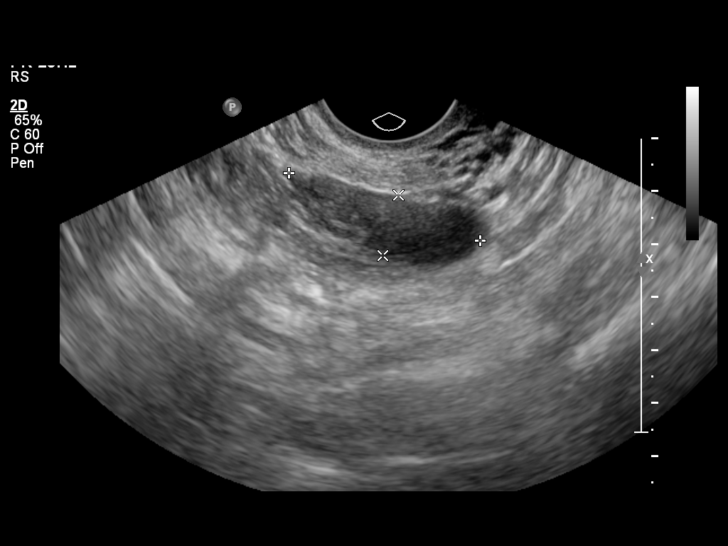
[im 49/54]
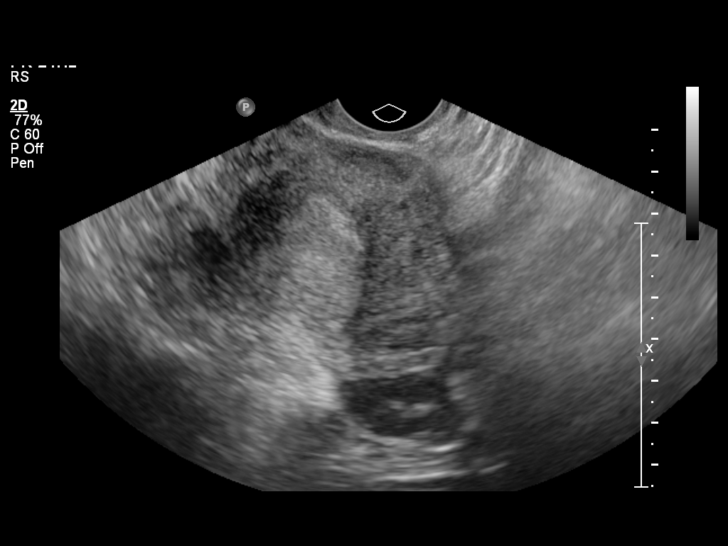
[im 54/54]
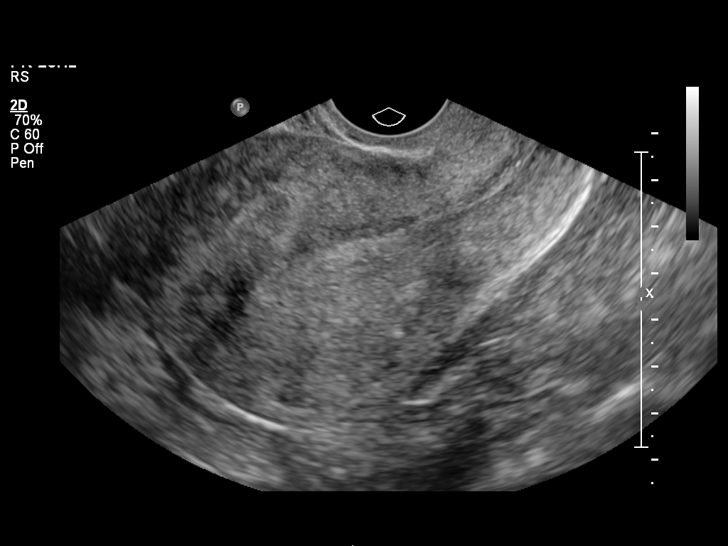

[13 of 25 positions shown; findings below may reference images not displayed]

FINDINGS: Uterus:  10.6 x 6.4 x 7.2 cm.  The diffusely heterogeneous
echogenicity of uterine myometrium noted.  A small fibroid is seen
in the anterior fundus measuring 1.8 cm in maximum diameter.  No
other distinct fibroids are identified.

Endometrium: Double layer thickness measures 11 mm transvaginally.
No focal lesion visualized.

Right ovary: 3.8 x 1.2 x 3.9 cm.  Normal appearance.  No adnexal
mass identified.

Left ovary: 2.6 x 1.6 x 3.1 cm.  Normal appearance.  No adnexal
mass identified.

Other Findings:  No free fluid
IMPRESSION: 1.  Diffusely heterogeneous myometrium, with 1.8 cm fibroid in the
anterior fundus. Uterine adenomyosiscannot be excluded; consider
pelvis MRI for further evaluation if clinically warranted.
2.  Endometrial thickness measures 11 mm. If bleeding remains
unresponsive to hormonal or medical therapy, sonohysterogram should
be considered for focal lesion work-up. (Ref:  Radiological
Reasoning: Algorithmic Workup of Abnormal Vaginal Bleeding with
Endovaginal Sonography and Sonohysterography. AJR [OP]; [id number])
.
3.  Normal appearance of both ovaries.  No adnexal mass identified.

## 2012-08-03 NOTE — Telephone Encounter (Signed)
Michele Hall and informed her of Ultrasound results and Dr. Macon Large recccomendation for an appt. Brittanni states she already has an appointment for [facility] office and asked if that was ok- informed her that was fine- her  Choice which location- reviewed date/time of appt.

## 2012-08-03 NOTE — Telephone Encounter (Signed)
Message copied by Gerome Apley on Wed Aug 03, 2012  1:36 PM ------      Message from: Jaynie Collins A      Created: Wed Aug 03, 2012  1:07 PM       Ultrasound shows small 1.8 cm fibroid and adenomyosis.  Patient needs to come back for further counseling and management of her abnormal bleeding. Please call to inform patient of results and recommendations and make follow up appointment with me.       ------

## 2012-08-04 ENCOUNTER — Telehealth: Payer: Self-pay | Admitting: *Deleted

## 2012-08-04 ENCOUNTER — Encounter: Payer: Self-pay | Admitting: Obstetrics & Gynecology

## 2012-08-04 ENCOUNTER — Ambulatory Visit (INDEPENDENT_AMBULATORY_CARE_PROVIDER_SITE_OTHER): Payer: BC Managed Care – PPO | Admitting: Obstetrics & Gynecology

## 2012-08-04 VITALS — BP 131/90 | HR 90 | Ht 66.0 in | Wt 256.0 lb

## 2012-08-04 MED ORDER — GABAPENTIN 600 MG PO TABS: 600.0000 mg | ORAL_TABLET | Freq: Every day | ORAL | Status: DC

## 2012-08-04 NOTE — Telephone Encounter (Signed)
Called patient to notify per Dr. Macon Large of her follow up appointment and results. Patient states she sees Dr. Macon Large at [facility] office and saw her today to discuss results and plan and she does not need appointment here at Outpatient clinics.  Apologized to patient for call. Patient states still waiting for surgery date- informed her another office will schedule that and notify her by mail and that takes a few weeks- offered her number to call if she does not hear from them soon.

## 2012-08-04 NOTE — Progress Notes (Signed)
History:  47 y.o. [id number] here today for follow up of evaluation of abnormal uterine bleeding. Still continues to have bleeding on Megace, but was only taking 10 mg daily instead of the prescribed 20 mg daily.   Patient also reported debilitating night sweats and hot flashes.  She wants to know what she can do for this.  The following portions of the patient's history were reviewed and updated as appropriate: allergies, current medications, past family history, past medical history, past social history, past surgical history and problem list.  Review of Systems:  Pertinent items are noted in HPI.  Objective:  Physical Exam BP 131/90  Pulse 90  Ht 5\' 6"  (1.676 m)  Wt 256 lb (116.121 kg)  BMI 41.34 kg/m2  LMP 07/29/2012 Deferred  Labs and Imaging 08/03/2012   TRANSABDOMINAL AND TRANSVAGINAL ULTRASOUND OF PELVIS  Clinical Data: Menometrorrhagia.  LMP 07/29/2012. Technique:  Both transabdominal and transvaginal ultrasound examinations of the pelvis were performed.  Transabdominal technique was performed for global imaging of the pelvis including uterus, ovaries, adnexal regions, and pelvic cul-de-sac.  It was necessary to proceed with endovaginal exam following the transabdominal exam to visualize the endometrium and ovaries.  Comparison:  11/19/2006  Findings: Uterus:  10.6 x 6.4 x 7.2 cm.  The diffusely heterogeneous echogenicity of uterine myometrium noted.  A small fibroid is seen in the anterior fundus measuring 1.8 cm in maximum diameter.  No other distinct fibroids are identified.  Endometrium: Double layer thickness measures 11 mm transvaginally. No focal lesion visualized.  Right ovary: 3.8 x 1.2 x 3.9 cm.  Normal appearance.  No adnexal mass identified.  Left ovary: 2.6 x 1.6 x 3.1 cm.  Normal appearance.  No adnexal mass identified.  Other Findings:  No free fluid  IMPRESSION:  1.  Diffusely heterogeneous myometrium, with 1.8 cm fibroid in the anterior fundus. Uterine adenomyosiscannot be  excluded; consider pelvis MRI for further evaluation if clinically warranted. 2.  Endometrial thickness measures 11 mm. If bleeding remains unresponsive to hormonal or medical therapy, sonohysterogram should be considered for focal lesion work-up. (Ref:  Radiological Reasoning: Algorithmic Workup of Abnormal Vaginal Bleeding with Endovaginal Sonography and Sonohysterography. AJR 2008; 454:U98-11) . 3.  Normal appearance of both ovaries.  No adnexal mass identified.   Original Report Authenticated By: Myles Rosenthal, M.D.    07/08/12 Endometrium, biopsy: PROLIFERATIVE-TYPE ENDOMETRIUM WITH BREAKDOWN.   Assessment & Plan:   Discussed management options for abnormal uterine bleeding including oral Provera, Depo Provera, Mirena IUD, endometrial ablation (Novasure/Hydrothermal Ablation/Thermachoice) or hysterectomy as definitive surgical management.  Discussed risks and benefits of each method.  Patient desires hydrothermal ablation.  The risks of surgery were discussed in detail with the patient including but not limited to:  bleeding; infection which may require antibiotics; injury to uterus leading to risk of injury to surrounding intraperitoneal organs, burn injury to vagina or other organs, need for additional procedures including laparoscopy or laparotomy, and other postoperative or anesthesia complications. Patient was informed that there is a high likelihood of success of controlling her symptoms; however about 3 - 5% of patients may require further intervention.  All questions were answered.  She was told that she will be contacted by our surgical scheduler regarding the time and date of her surgery.  Printed patient education handouts about the procedure were given to the patient to review at home.  Bleeding precautions reviewed; emphasized correct dosage of Provera, told she can increase to 40 mg daily for days of heavy bleeding.  For vasomotor symptoms,  discussed lifestyle interventions, also talked  about nonhormonal management with Effexor or Neurontin.  She wants to try Neurontin; this was prescribed. Will follow up her response to the therapy.

## 2012-08-04 NOTE — Patient Instructions (Addendum)
Endometrial Ablation Endometrial ablation removes the lining of the uterus (endometrium). It is usually a same day, outpatient treatment. Ablation helps avoid major surgery (such as a hysterectomy). A hysterectomy is removal of the cervix and uterus. Endometrial ablation has less risk and complications, has a shorter recovery period and is less expensive. After endometrial ablation, most women will have little or no menstrual bleeding. You may not keep your fertility. Pregnancy is no longer likely after this procedure but if you are pre-menopausal, you still need to use a reliable method of birth control following the procedure because pregnancy can occur. REASONS TO HAVE THE PROCEDURE MAY INCLUDE:  Heavy periods.  Bleeding that is causing anemia.  Anovulatory bleeding, very irregular, bleeding.  Bleeding submucous fibroids (on the lining inside the uterus) if they are smaller than 3 centimeters. REASONS NOT TO HAVE THE PROCEDURE MAY INCLUDE:  You wish to have more children.  You have a pre-cancerous or cancerous problem. The cause of any abnormal bleeding must be diagnosed before having the procedure.  You have pain coming from the uterus.  You have a submucus fibroid larger than 3 centimeters.  You recently had a baby.  You recently had an infection in the uterus.  You have a severe retro-flexed, tipped uterus and cannot insert the instrument to do the ablation.  You had a Cesarean section or deep major surgery on the uterus.  The inner cavity of the uterus is too large for the endometrial ablation instrument. RISKS AND COMPLICATIONS   Perforation of the uterus.  Bleeding.  Infection of the uterus, bladder or vagina.  Injury to surrounding organs.  Cutting the cervix.  An air bubble to the lung (air embolus).  Pregnancy following the procedure.  Failure of the procedure to help the problem requiring hysterectomy.  Decreased ability to diagnose cancer in the lining of  the uterus. BEFORE THE PROCEDURE  The lining of the uterus must be tested to make sure there is no pre-cancerous or cancer cells present.  Medications may be given to make the lining of the uterus thinner.  Ultrasound may be used to evaluate the size and look for abnormalities of the uterus.  Future pregnancy is not desired. PROCEDURE  There are different ways to destroy the lining of the uterus.   Resectoscope - radio frequency-alternating electric current is the most common one used.  Cryotherapy - freezing the lining of the uterus.  Heated Free Liquid - heated salt (saline) solution inserted into the uterus.  Wire mesh - uses wire mesh to apply heat to the inside of the uterus  Thermal Balloon - a catheter with a balloon tip is inserted into the uterus and filled with heated fluid. Your caregiver will talk with you about the method used in this clinic. They will also instruct you on the pros and cons of the procedure. Endometrial ablation is performed along with a procedure called operative hysteroscopy. A narrow viewing tube is inserted through the birth canal (vagina) and through the cervix into the uterus. A tiny camera attached to the viewing tube (hysteroscope) allows the uterine cavity to be shown on a TV monitor during surgery. Your uterus is filled with a harmless liquid to make the procedure easier. The lining of the uterus is then removed. The lining can also be removed with a resectoscope which allows your surgeon to cut away the lining of the uterus under direct vision. Usually, you will be able to go home within an hour after the procedure.  HOME CARE INSTRUCTIONS   Do not drive for 24 hours.  No tampons, douching or intercourse for 2 weeks or until your caregiver approves.  Rest at home for 24 to 48 hours. You may then resume normal activities unless told differently by your caregiver.  Take your temperature two times a day for 4 days, and record it.  Take any  medications your caregiver has ordered, as directed.  Use some form of contraception if you are pre-menopausal and do not want to get pregnant. Bleeding after the procedure is normal. It varies from light spotting and mildly watery to bloody discharge for 4 to 6 weeks. You may also have mild cramping. Only take over-the-counter or prescription medicines for pain, discomfort, or fever as directed by your caregiver. Do not use aspirin, as this may aggravate bleeding. Frequent urination during the first 24 hours is normal. You will not know how effective your surgery is until at least 3 months after the surgery. SEEK IMMEDIATE MEDICAL CARE IF:   Bleeding is heavier than a normal menstrual cycle.  An oral temperature above 102 F (38.9 C) develops.  You have increasing cramps or pains not relieved with medication or develop belly (abdominal) pain which does not seem to be related to the same area of earlier cramping and pain.  You are light headed, weak or have fainting episodes.  You develop pain in the shoulder strap areas.  You have chest or leg pain.  You have abnormal vaginal discharge.  You have painful urination. Document Released: 04/03/2004 Document Revised: 08/17/2011 Document Reviewed: 07/02/2007 [facility] Patient Information 2013 [place], [place].

## 2012-08-04 NOTE — Progress Notes (Signed)
Patient is unable to tolerate the iron it causes terrible stomach upset.

## 2012-08-04 NOTE — Telephone Encounter (Addendum)
Message copied by Gerome Apley on Thu Aug 04, 2012  4:22 PM ------      Message from: Odelia Gage A      Created: Wed Aug 03, 2012  4:23 PM         Appointment is 03/04 @ 11:30                   ----- Message -----         From: Tereso Newcomer, MD         Sent: 08/03/2012   1:07 PM           To: [facility], [facility]            Ultrasound shows small 1.8 cm fibroid and adenomyosis.  Patient needs to come back for further counseling and management of her abnormal bleeding. Please call to inform patient of results and recommendations and make follow up appointment with me.             ------

## 2012-08-12 ENCOUNTER — Ambulatory Visit: Payer: BC Managed Care – PPO | Admitting: Obstetrics & Gynecology

## 2012-08-31 ENCOUNTER — Telehealth: Payer: Self-pay | Admitting: *Deleted

## 2012-08-31 DIAGNOSIS — N939 Abnormal uterine and vaginal bleeding, unspecified: Secondary | ICD-10-CM

## 2012-08-31 NOTE — Telephone Encounter (Signed)
Patient is up to 60 mg a day of the provera to try to stop the bleeding and she is still bleeding.  She wants to know if there are any other options for her to try to get the bleeding to stop.  She is scheduled for Ablation in the beginning of April.

## 2012-09-01 MED ORDER — MEGESTROL ACETATE 40 MG PO TABS: ORAL_TABLET | ORAL | Status: DC

## 2012-09-01 NOTE — Addendum Note (Signed)
Addended by: Jaynie Collins A on: 09/01/2012 10:11 AM   Modules accepted: Orders

## 2012-09-01 NOTE — Telephone Encounter (Signed)
She can try Megace (megestrol) instead to see if this gives her a better response.  Medication ordered; she should pick it up and take as directed.

## 2012-09-09 ENCOUNTER — Other Ambulatory Visit: Payer: Self-pay

## 2012-09-09 ENCOUNTER — Encounter (HOSPITAL_COMMUNITY): Payer: Self-pay

## 2012-09-09 ENCOUNTER — Encounter (HOSPITAL_COMMUNITY)
Admission: RE | Admit: 2012-09-09 | Discharge: 2012-09-09 | Disposition: A | Discharge status: Home / Self Care | Payer: BC Managed Care – PPO | Source: Ambulatory Visit | Attending: Obstetrics & Gynecology | Admitting: Obstetrics & Gynecology

## 2012-09-09 ENCOUNTER — Inpatient Hospital Stay (HOSPITAL_COMMUNITY): Admission: RE | Admit: 2012-09-09 | Payer: BC Managed Care – PPO | Source: Ambulatory Visit

## 2012-09-09 ENCOUNTER — Encounter (HOSPITAL_COMMUNITY): Payer: Self-pay | Admitting: Pharmacy Technician

## 2012-09-09 HISTORY — DX: Gastro-esophageal reflux disease without esophagitis: K21.9

## 2012-09-09 HISTORY — DX: Anemia, unspecified: D64.9

## 2012-09-09 LAB — CBC
HCT: 35.7 % — ABNORMAL LOW (ref 36.0–46.0)
Hemoglobin: 11.5 g/dL — ABNORMAL LOW (ref 12.0–15.0)
MCV: 80.8 fL (ref 78.0–100.0)
Platelets: 248 10*3/uL (ref 150–400)
RBC: 4.42 MIL/uL (ref 3.87–5.11)
WBC: 6.9 10*3/uL (ref 4.0–10.5)

## 2012-09-09 LAB — BASIC METABOLIC PANEL
CO2: 23 mEq/L (ref 19–32)
Chloride: 102 mEq/L (ref 96–112)
Creatinine, Ser: 0.8 mg/dL (ref 0.50–1.10)
Glucose, Bld: 82 mg/dL (ref 70–99)

## 2012-09-09 NOTE — Patient Instructions (Addendum)
Your procedure is scheduled on:09/13/12  Enter through the Main Entrance at :1130 am Pick up desk phone and dial [phone] and inform us of your arrival.  Please call [phone] if you have any problems the morning of surgery.  Remember: Do not eat after midnight:Monday  Clear liquids ok until 9am on Tuesday Take these meds the morning of surgery with a sip of water: BP med and Omeprazole  DO NOT wear jewelry, eye make-up, lipstick,body lotion, or dark fingernail polish.  Patients discharged on the day of surgery will not be allowed to drive home.

## 2012-09-13 ENCOUNTER — Ambulatory Visit (HOSPITAL_COMMUNITY)
Admission: RE | Admit: 2012-09-13 | Discharge: 2012-09-13 | Disposition: A | Discharge status: Home / Self Care | Payer: BC Managed Care – PPO | Source: Ambulatory Visit | Attending: Obstetrics & Gynecology | Admitting: Obstetrics & Gynecology

## 2012-09-13 ENCOUNTER — Encounter (HOSPITAL_COMMUNITY)
Admission: RE | Disposition: A | Discharge status: Home / Self Care | Payer: Self-pay | Source: Ambulatory Visit | Attending: Obstetrics & Gynecology

## 2012-09-13 ENCOUNTER — Encounter (HOSPITAL_COMMUNITY): Payer: Self-pay | Admitting: Anesthesiology

## 2012-09-13 ENCOUNTER — Ambulatory Visit (HOSPITAL_COMMUNITY): Payer: BC Managed Care – PPO | Admitting: Anesthesiology

## 2012-09-13 DIAGNOSIS — N938 Other specified abnormal uterine and vaginal bleeding: Secondary | ICD-10-CM | POA: Insufficient documentation

## 2012-09-13 DIAGNOSIS — D649 Anemia, unspecified: Secondary | ICD-10-CM | POA: Insufficient documentation

## 2012-09-13 DIAGNOSIS — I10 Essential (primary) hypertension: Secondary | ICD-10-CM | POA: Insufficient documentation

## 2012-09-13 DIAGNOSIS — N926 Irregular menstruation, unspecified: Secondary | ICD-10-CM

## 2012-09-13 DIAGNOSIS — N939 Abnormal uterine and vaginal bleeding, unspecified: Secondary | ICD-10-CM

## 2012-09-13 DIAGNOSIS — N949 Unspecified condition associated with female genital organs and menstrual cycle: Secondary | ICD-10-CM | POA: Insufficient documentation

## 2012-09-13 HISTORY — PX: DILITATION & CURRETTAGE/HYSTROSCOPY WITH HYDROTHERMAL ABLATION: SHX5570

## 2012-09-13 LAB — PREGNANCY, URINE: Preg Test, Ur: NEGATIVE

## 2012-09-13 SURGERY — DILATATION & CURETTAGE/HYSTEROSCOPY WITH HYDROTHERMAL ABLATION
Anesthesia: General | Site: Vagina | Wound class: Clean Contaminated

## 2012-09-13 MED ORDER — FENTANYL CITRATE 0.05 MG/ML IJ SOLN
INTRAMUSCULAR | Status: AC
  Filled 2012-09-13: qty 2

## 2012-09-13 MED ORDER — BUPIVACAINE HCL 0.5 % IJ SOLN
INTRAMUSCULAR | Status: DC | PRN
  Administered 2012-09-13: 30 mL

## 2012-09-13 MED ORDER — PROPOFOL 10 MG/ML IV EMUL
INTRAVENOUS | Status: AC
  Filled 2012-09-13: qty 20

## 2012-09-13 MED ORDER — OXYCODONE-ACETAMINOPHEN 5-325 MG PO TABS: 1.0000 | ORAL_TABLET | Freq: Once | ORAL | Status: AC

## 2012-09-13 MED ORDER — PROPOFOL 10 MG/ML IV EMUL
INTRAVENOUS | Status: DC | PRN
  Administered 2012-09-13: 180 mg via INTRAVENOUS

## 2012-09-13 MED ORDER — LACTATED RINGERS IV SOLN
INTRAVENOUS | Status: DC
  Administered 2012-09-13 (×2): via INTRAVENOUS

## 2012-09-13 MED ORDER — OXYCODONE-ACETAMINOPHEN 5-325 MG PO TABS
ORAL_TABLET | ORAL | Status: AC
  Administered 2012-09-13: 1 via ORAL
  Filled 2012-09-13: qty 1

## 2012-09-13 MED ORDER — KETOROLAC TROMETHAMINE 30 MG/ML IJ SOLN
INTRAMUSCULAR | Status: DC | PRN
  Administered 2012-09-13: 30 mg via INTRAVENOUS

## 2012-09-13 MED ORDER — MEPERIDINE HCL 25 MG/ML IJ SOLN: 6.2500 mg | INTRAMUSCULAR | Status: DC | PRN

## 2012-09-13 MED ORDER — DOCUSATE SODIUM 100 MG PO CAPS: 100.0000 mg | ORAL_CAPSULE | Freq: Two times a day (BID) | ORAL | Status: DC | PRN

## 2012-09-13 MED ORDER — OXYCODONE-ACETAMINOPHEN 5-325 MG PO TABS: 1.0000 | ORAL_TABLET | Freq: Four times a day (QID) | ORAL | Status: DC | PRN

## 2012-09-13 MED ORDER — MIDAZOLAM HCL 2 MG/2ML IJ SOLN
INTRAMUSCULAR | Status: AC
  Filled 2012-09-13: qty 2

## 2012-09-13 MED ORDER — FENTANYL CITRATE 0.05 MG/ML IJ SOLN
INTRAMUSCULAR | Status: DC | PRN
  Administered 2012-09-13: 50 ug via INTRAVENOUS

## 2012-09-13 MED ORDER — CEFAZOLIN SODIUM-DEXTROSE 2-3 GM-% IV SOLR: 2.0000 g | INTRAVENOUS | Status: DC

## 2012-09-13 MED ORDER — MIDAZOLAM HCL 5 MG/5ML IJ SOLN
INTRAMUSCULAR | Status: DC | PRN
  Administered 2012-09-13: 2 mg via INTRAVENOUS

## 2012-09-13 MED ORDER — BUPIVACAINE HCL (PF) 0.5 % IJ SOLN
INTRAMUSCULAR | Status: AC
  Filled 2012-09-13: qty 30

## 2012-09-13 MED ORDER — IBUPROFEN 600 MG PO TABS: 600.0000 mg | ORAL_TABLET | Freq: Four times a day (QID) | ORAL | Status: DC | PRN

## 2012-09-13 MED ORDER — ONDANSETRON HCL 4 MG/2ML IJ SOLN
INTRAMUSCULAR | Status: DC | PRN
  Administered 2012-09-13: 4 mg via INTRAVENOUS

## 2012-09-13 MED ORDER — FENTANYL CITRATE 0.05 MG/ML IJ SOLN: 25.0000 ug | INTRAMUSCULAR | Status: DC | PRN

## 2012-09-13 MED ORDER — METOCLOPRAMIDE HCL 5 MG/ML IJ SOLN: 10.0000 mg | Freq: Once | INTRAMUSCULAR | Status: DC | PRN

## 2012-09-13 MED ORDER — ONDANSETRON HCL 4 MG/2ML IJ SOLN
INTRAMUSCULAR | Status: AC
  Filled 2012-09-13: qty 2

## 2012-09-13 MED ORDER — LIDOCAINE HCL (CARDIAC) 20 MG/ML IV SOLN
INTRAVENOUS | Status: DC | PRN
  Administered 2012-09-13 (×2): 30 mg via INTRAVENOUS

## 2012-09-13 MED ORDER — LIDOCAINE HCL (CARDIAC) 20 MG/ML IV SOLN
INTRAVENOUS | Status: AC
  Filled 2012-09-13: qty 5

## 2012-09-13 SURGICAL SUPPLY — 15 items
CATH ROBINSON RED A/P 16FR (CATHETERS) ×2 IMPLANT
CLOTH BEACON ORANGE TIMEOUT ST (SAFETY) ×2 IMPLANT
CONTAINER PREFILL 10% NBF 60ML (FORM) ×4 IMPLANT
DRAPE HYSTEROSCOPY (DRAPE) ×2 IMPLANT
DRESSING TELFA 8X3 (GAUZE/BANDAGES/DRESSINGS) ×2 IMPLANT
GLOVE BIO SURGEON STRL SZ7 (GLOVE) ×2 IMPLANT
GOWN STRL REIN XL XLG (GOWN DISPOSABLE) ×6 IMPLANT
NEEDLE SPNL 20GX3.5 QUINCKE YW (NEEDLE) ×2 IMPLANT
NEEDLE SPNL 22GX3.5 QUINCKE BK (NEEDLE) ×2 IMPLANT
PACK VAGINAL MINOR WOMEN LF (CUSTOM PROCEDURE TRAY) ×2 IMPLANT
PAD OB MATERNITY 4.3X12.25 (PERSONAL CARE ITEMS) ×2 IMPLANT
SET GENESYS HTA PROCERVA (MISCELLANEOUS) ×2 IMPLANT
SYR CONTROL 10ML LL (SYRINGE) ×2 IMPLANT
TOWEL OR 17X24 6PK STRL BLUE (TOWEL DISPOSABLE) ×4 IMPLANT
WATER STERILE IRR 1000ML POUR (IV SOLUTION) ×2 IMPLANT

## 2012-09-13 NOTE — Transfer of Care (Signed)
Immediate Anesthesia Transfer of Care Note  Patient: Michele Hall  Procedure(s) Performed: Procedure(s): DILATATION & CURETTAGE/HYSTEROSCOPY WITH HYDROTHERMAL ABLATION (N/A)  Patient Location: PACU  Anesthesia Type:General  Level of Consciousness: awake, sedated and patient cooperative  Airway & Oxygen Therapy: Patient Spontanous Breathing and Patient connected to nasal cannula oxygen  Post-op Assessment: Report given to PACU RN and Post -op Vital signs reviewed and stable  Post vital signs: Reviewed and stable  Complications: No apparent anesthesia complications

## 2012-09-13 NOTE — Op Note (Signed)
PREOPERATIVE DIAGNOSIS:  Abnormal uterine bleeding POSTOPERATIVE DIAGNOSIS: The same PROCEDURE: Dilation and Curettage, Hysteroscopy, Hydrothermal Endometrial Ablation SURGEON:  Dr. Jaynie Collins  INDICATIONS: 47 y.o. [id number] here for scheduled surgery for abnormal uterine bleeding. Risks of surgery were discussed with the patient including but not limited to: bleeding; infection which may require antibiotics; injury to uterus leading to risk of injury to surrounding intraperitoneal organs, burn injury to vagina or other organs, need for additional procedures including laparoscopy or laparotomy, and other postoperative/anesthesia complications. Patient was informed that there is a high likelihood of success of controlling her symptoms; however about 5% of patients may require further intervention.  Written informed consent was obtained.    FINDINGS:  A 10 week size uterus.  Diffuse proliferative endometrium.  Normal ostia bilaterally.  ANESTHESIA:   General, paracervical block. INTRAVENOUS FLUIDS:  1300 ml of LR ESTIMATED BLOOD LOSS:  5 ml SPECIMENS: Endometrial curettings sent to pathology COMPLICATIONS:  None immediate.  PROCEDURE DETAILS:  The patient was then taken to the operating room where general anesthesia was administered and was found to be adequate.  After an adequate timeout was performed, she was placed in the dorsal lithotomy position and examined; then prepped and draped in the sterile manner.   Her bladder was catheterized for clear, yellow urine. A speculum was then placed in the patient's vagina and a single tooth tenaculum was applied to the anterior lip of the cervix.   A paracervical block using 30 ml of 0.5% Marcaine was administered.  The cervix was sounded to 10 cm and dilated manually with metal dilators to accommodate the hydrothermal ablation hysteroscopic apparatus.  Once the cervix was dilated, a sharp curettage was then performed to obtain a moderate amount of  endometrial curettings.  The hysteroscope was inserted under direct visualization using normal saline as a suspension medium.  The uterine cavity was carefully examined, both ostia were recognized, and diffusely proliferative endometrium was noted.   The hydrothermal ablation was then carried out as per protocol.   Complete ablation of the endometrium was observed and the hysteroscope was removed under direct visualization. .  No complications were observed.  The tenaculum was removed from the anterior lip of the cervix, and the vaginal speculum was removed after noting good hemostasis.  The patient tolerated the procedure well and was taken to the recovery area awake, extubated and in stable condition.  The patient will be discharged to home as per PACU criteria.  Routine postoperative instructions given.  She was prescribed Percocet, Ibuprofen and Colace.  She will follow up in the clinic in 2-3 weeks  for postoperative evaluation.

## 2012-09-13 NOTE — H&P (Signed)
Preoperative History and Physical  Michele Hall is a 47 y.o. [id number] here for surgical management of abnormal uterine bleeding.   Proposed surgery: Hysteroscopy, D&C, hydrothermal endometrial ablation  Past Medical History  Diagnosis Date  . Hypertension   . Anemia   . Chronic kidney disease     stones  . GERD (gastroesophageal reflux disease)    Past Surgical History  Procedure Laterality Date  . Cholecystectomy    . Tubal ligation     OB History   Grav Para Term Preterm Abortions TAB SAB Ect Mult Living   2 2 2       2      Prescriptions prior to admission  Medication Sig Dispense Refill  . omeprazole (PRILOSEC) 40 MG capsule Take 1 capsule (40 mg total) by mouth daily.  30 capsule  11  . spironolactone (ALDACTONE) 25 MG tablet Take 1 tablet (25 mg total) by mouth daily.  90 tablet  3  . acetaminophen (TYLENOL) 500 MG tablet Take 500 mg by mouth every 6 (six) hours as needed for pain (For headache.).      Marland Kitchen ibuprofen (ADVIL,MOTRIN) 200 MG tablet Take 400 mg by mouth every 8 (eight) hours as needed. For pain      . loratadine (CLARITIN) 10 MG tablet Take 10 mg by mouth daily.      . Vitamin D, Ergocalciferol, (DRISDOL) 50000 UNITS CAPS Take 50,000 Units by mouth every 7 (seven) days. On Wednesdays.        Allergies  Allergen Reactions  . Requip (Ropinirole Hcl) Other (See Comments)    Headache Feels "sick"   Social History:   reports that she quit smoking about 10 years ago. She has never used smokeless tobacco. She reports that she does not drink alcohol or use illicit drugs. Family History  Problem Relation Age of Onset  . Stroke Mother   . Kidney disease Father   . Coronary artery disease Father   . Aneurysm Father   . Hypertension Father   . Seizures Sister   . Seizures Brother   . Heart attack Maternal Grandfather   . Seizures Sister   . Cancer Sister     ovarian/uterus  . Tongue cancer Sister     Review of Systems: Noncontributory  PHYSICAL  EXAM: Blood pressure 145/86, pulse 91, temperature 97.7 F (36.5 C), temperature source Oral, resp. rate 20, SpO2 99.00%. General appearance - alert, well appearing, and in no distress Chest - clear to auscultation, no wheezes, rales or rhonchi, symmetric air entry Heart - normal rate and regular rhythm Abdomen - soft, nontender, nondistended, no masses or organomegaly Pelvic - examination not indicated Extremities - peripheral pulses normal, no pedal edema, no clubbing or cyanosis  Labs: Results for orders placed during the hospital encounter of 09/13/12 (from the past 336 hour(s))  PREGNANCY, URINE   Collection Time    09/13/12 12:00 PM      Result Value Range   Preg Test, Ur NEGATIVE  NEGATIVE  Results for orders placed during the hospital encounter of 09/09/12 (from the past 336 hour(s))  CBC   Collection Time    09/09/12  2:30 PM      Result Value Range   WBC 6.9  4.0 - 10.5 K/uL   RBC 4.42  3.87 - 5.11 MIL/uL   Hemoglobin 11.5 (*) 12.0 - 15.0 g/dL   HCT 45.4 (*) 09.8 - 11.9 %   MCV 80.8  78.0 - 100.0 fL   MCH 26.0  26.0 - 34.0 pg   MCHC 32.2  30.0 - 36.0 g/dL   RDW 29.5 (*) 62.1 - 30.8 %   Platelets 248  150 - 400 K/uL  BASIC METABOLIC PANEL   Collection Time    09/09/12  2:30 PM      Result Value Range   Sodium 135  135 - 145 mEq/L   Potassium 3.5  3.5 - 5.1 mEq/L   Chloride 102  96 - 112 mEq/L   CO2 23  19 - 32 mEq/L   Glucose, Bld 82  70 - 99 mg/dL   BUN 15  6 - 23 mg/dL   Creatinine, Ser 6.57  0.50 - 1.10 mg/dL   Calcium 9.1  8.4 - 84.6 mg/dL   GFR calc non Af Amer 88 (*) >90 mL/min   GFR calc Af Amer >90  >90 mL/min   08/03/2012 TRANSABDOMINAL AND TRANSVAGINAL ULTRASOUND OF PELVIS Clinical Data: Menometrorrhagia. LMP 07/29/2012. Technique: Both transabdominal and transvaginal ultrasound examinations of the pelvis were performed. Transabdominal technique was performed for global imaging of the pelvis including uterus, ovaries, adnexal regions, and pelvic  cul-de-sac. It was necessary to proceed with endovaginal exam following the transabdominal exam to visualize the endometrium and ovaries. Comparison: 11/19/2006 Findings: Uterus: 10.6 x 6.4 x 7.2 cm. The diffusely heterogeneous echogenicity of uterine myometrium noted. A small fibroid is seen in the anterior fundus measuring 1.8 cm in maximum diameter. No other distinct fibroids are identified. Endometrium: Double layer thickness measures 11 mm transvaginally. No focal lesion visualized. Right ovary: 3.8 x 1.2 x 3.9 cm. Normal appearance. No adnexal mass identified. Left ovary: 2.6 x 1.6 x 3.1 cm. Normal appearance. No adnexal mass identified. Other Findings: No free fluid IMPRESSION: 1. Diffusely heterogeneous myometrium, with 1.8 cm fibroid in the anterior fundus. Uterine adenomyosiscannot be excluded; consider pelvis MRI for further evaluation if clinically warranted. 2. Endometrial thickness measures 11 mm. If bleeding remains unresponsive to hormonal or medical therapy, sonohysterogram should be considered for focal lesion work-up. (Ref: Radiological Reasoning: Algorithmic Workup of Abnormal Vaginal Bleeding with Endovaginal Sonography and Sonohysterography. AJR 2008; 962:X52-84) . 3. Normal appearance of both ovaries. No adnexal mass identified. Original Report Authenticated By: Myles Rosenthal, M.D.   07/08/12 Endometrium, biopsy: PROLIFERATIVE-TYPE ENDOMETRIUM WITH BREAKDOWN.   Assessment: Patient Active Problem List  Diagnosis  . HYPERLIPIDEMIA  . OBESITY  . RESTLESS LEG SYNDROME  . HYPERTENSION  . GERD  . IBS  . Abnormal uterine bleeding  . VITAMIN B12 DEFICIENCY  . ANEMIA, IRON DEFICIENCY  . History of nephrolithiasis  . ASCUS pap smear with negative high rish risk HPV  . Adenomyosis  . Fibroids    Plan: Patient will undergo surgical management with hysteroscopy, D&C, hydrothermal endometrial ablation.   The risks of surgery were discussed in detail with the patient including but not  limited to: bleeding; infection which may require antibiotics; injury to uterus leading to risk of injury to surrounding intraperitoneal organs, burn injury to vagina or other organs, need for additional procedures including laparoscopy or laparotomy, and other postoperative or anesthesia complications. Patient was informed that there is a high likelihood of success of controlling her symptoms; however about 3 - 5% of patients may require further intervention. All questions were answered. Routine postoperative instructions will be reviewed with the patient and her family in detail after surgery.  The patient concurred with the proposed plan, giving informed written consent for the surgery.  Patient has been NPO since last night she will remain  NPO for procedure.  Anesthesia and OR aware.  To OR when ready.    Jaynie Collins, M.D. 09/13/2012 12:43 PM

## 2012-09-13 NOTE — Anesthesia Preprocedure Evaluation (Signed)
Anesthesia Evaluation  Patient identified by MRN, date of birth, ID band Patient awake    Reviewed: Allergy & Precautions, H&P , NPO status , Patient's Chart, lab work & pertinent test results  Airway Mallampati: II TM Distance: >3 FB Neck ROM: Full    Dental no notable dental hx.    Pulmonary neg pulmonary ROS,  Snores ? Undiagnosed OSA breath sounds clear to auscultation  Pulmonary exam normal       Cardiovascular hypertension, Pt. on medications Rhythm:Regular Rate:Normal     Neuro/Psych negative neurological ROS  negative psych ROS   GI/Hepatic GERD-  Medicated and Controlled,  Endo/Other  Morbid obesity  Renal/GU Renal disease  negative genitourinary   Musculoskeletal negative musculoskeletal ROS (+)   Abdominal (+) + obese,   Peds  Hematology  (+) Blood dyscrasia, anemia ,   Anesthesia Other Findings   Reproductive/Obstetrics Adenomyosis DUB                           Anesthesia Physical Anesthesia Plan  ASA: III  Anesthesia Plan: General   Post-op Pain Management:    Induction: Intravenous  Airway Management Planned: LMA  Additional Equipment:   Intra-op Plan:   Post-operative Plan: Extubation in OR  Informed Consent: I have reviewed the patients History and Physical, chart, labs and discussed the procedure including the risks, benefits and alternatives for the proposed anesthesia with the patient or authorized representative who has indicated his/her understanding and acceptance.   Dental advisory given  Plan Discussed with: CRNA, Anesthesiologist and Surgeon  Anesthesia Plan Comments:         Anesthesia Quick Evaluation

## 2012-09-13 NOTE — Anesthesia Postprocedure Evaluation (Signed)
  Anesthesia Post-op Note  Patient: Michele Hall  Procedure(s) Performed: Procedure(s): DILATATION & CURETTAGE/HYSTEROSCOPY WITH HYDROTHERMAL ABLATION (N/A)  Patient Location: PACU  Anesthesia Type:General  Level of Consciousness: awake and alert   Airway and Oxygen Therapy: Patient Spontanous Breathing  Post-op Pain: mild  Post-op Assessment: Post-op Vital signs reviewed, Patient's Cardiovascular Status Stable, Respiratory Function Stable, Patent Airway, No signs of Nausea or vomiting and Pain level controlled  Post-op Vital Signs: Reviewed and stable  Complications: No apparent anesthesia complications

## 2012-09-14 ENCOUNTER — Encounter (HOSPITAL_COMMUNITY): Payer: Self-pay | Admitting: Obstetrics & Gynecology

## 2012-10-06 ENCOUNTER — Encounter: Payer: Self-pay | Admitting: *Deleted

## 2012-10-10 ENCOUNTER — Encounter: Payer: Self-pay | Admitting: Obstetrics & Gynecology

## 2012-10-10 ENCOUNTER — Ambulatory Visit (INDEPENDENT_AMBULATORY_CARE_PROVIDER_SITE_OTHER): Payer: BC Managed Care – PPO | Admitting: Obstetrics & Gynecology

## 2012-10-10 VITALS — BP 139/92 | HR 99 | Temp 99.0°F | Ht 66.0 in | Wt 258.7 lb

## 2012-10-10 DIAGNOSIS — Z09 Encounter for follow-up examination after completed treatment for conditions other than malignant neoplasm: Secondary | ICD-10-CM

## 2012-10-10 NOTE — Patient Instructions (Signed)
Return to clinic for any scheduled appointments or for any gynecologic concerns as needed.   

## 2012-10-10 NOTE — Progress Notes (Signed)
  Subjective:     Michele Hall. Michele Hall is a 47 y.o. female who presents to the clinic s/p Dilation and Curettage, Hysteroscopy, Hydrothermal Endometrial Ablation on 09/13/12 for abnormal uterine bleeding. Eating a regular diet without difficulty. Bowel movements are normal. The patient is not having any pain. No bleeding since surgery, just periodic brown discharge.  The following portions of the patient's history were reviewed and updated as appropriate: allergies, current medications, past family history, past medical history, past social history, past surgical history and problem list.  Last pap smear 06/2012 was normal, negative HRHPV.  Normal mammogram on 08/04/12.   Review of Systems A comprehensive review of systems was negative.    Objective:    BP 139/92  Pulse 99  Temp(Src) 99 F (37.2 C) (Oral)  Ht 5\' 6"  (1.676 m)  Wt 258 lb 11.2 oz (117.346 kg)  BMI 41.78 kg/m2 General:  cooperative and no distress  Abdomen: soft, bowel sounds active, non-tender  Pelvic:  deferred    09/13/12 Endometrium, curettage :BENIGN PROLIFERATIVE ENDOMETRIUM, NO ATYPIA, HYPERPLASIA OR MALIGNANCY.  Assessment:    Doing well postoperatively. Operative findings again reviewed. Pathology report discussed.    Plan:   1. Continue any current medications. 2. Activity restrictions: none 3. Anticipated return to work: now. 4. Routine preventative health maintenance measures emphasized

## 2012-11-04 ENCOUNTER — Encounter: Payer: Self-pay | Admitting: Obstetrics & Gynecology

## 2012-11-04 ENCOUNTER — Ambulatory Visit (INDEPENDENT_AMBULATORY_CARE_PROVIDER_SITE_OTHER): Payer: BC Managed Care – PPO | Admitting: Obstetrics & Gynecology

## 2012-11-04 VITALS — BP 134/98 | HR 87 | Ht 66.0 in | Wt 258.0 lb

## 2012-11-04 DIAGNOSIS — R350 Frequency of micturition: Secondary | ICD-10-CM

## 2012-11-04 LAB — POCT URINALYSIS DIPSTICK
Bilirubin, UA: NEGATIVE
Nitrite, UA: NEGATIVE
Urobilinogen, UA: NEGATIVE
pH, UA: 6

## 2012-11-04 MED ORDER — FLAVOXATE HCL 100 MG PO TABS: 100.0000 mg | ORAL_TABLET | Freq: Three times a day (TID) | ORAL | Status: DC | PRN

## 2012-11-04 MED ORDER — SULFAMETHOXAZOLE-TRIMETHOPRIM 800-160 MG PO TABS: 1.0000 | ORAL_TABLET | Freq: Two times a day (BID) | ORAL | Status: DC

## 2012-11-04 NOTE — Progress Notes (Signed)
  Subjective:    Patient ID: Michele Hall. Wacker, female    DOB: [birth year], 47 y.o.   MRN: [id number]  HPI  She is here today because her urinary frequency and dysuria is worsening. She is leaving for [place] today and is worried about the discomfort on the plane. She denies fevers.   Review of Systems     Objective:   Physical Exam  No CVAT      Assessment & Plan:  UTI- check urine culture Bactrim and urispas

## 2012-11-04 NOTE — Addendum Note (Signed)
Addended by: Barbara Cower on: 11/04/2012 11:46 AM   Modules accepted: Orders

## 2012-11-18 ENCOUNTER — Telehealth: Payer: Self-pay | Admitting: Family Medicine

## 2012-11-18 ENCOUNTER — Ambulatory Visit (INDEPENDENT_AMBULATORY_CARE_PROVIDER_SITE_OTHER): Payer: BC Managed Care – PPO | Admitting: Family Medicine

## 2012-11-18 ENCOUNTER — Encounter: Payer: Self-pay | Admitting: Family Medicine

## 2012-11-18 VITALS — BP 130/70 | HR 87 | Temp 98.2°F | Ht 66.0 in | Wt 260.8 lb

## 2012-11-18 DIAGNOSIS — R300 Dysuria: Secondary | ICD-10-CM

## 2012-11-18 DIAGNOSIS — Z87442 Personal history of urinary calculi: Secondary | ICD-10-CM

## 2012-11-18 DIAGNOSIS — N10 Acute tubulo-interstitial nephritis: Secondary | ICD-10-CM

## 2012-11-18 LAB — POCT URINALYSIS DIPSTICK
Glucose, UA: NEGATIVE
Nitrite, UA: NEGATIVE
Protein, UA: NEGATIVE
Spec Grav, UA: 1.02
Urobilinogen, UA: NEGATIVE
pH, UA: 6

## 2012-11-18 MED ORDER — LEVOFLOXACIN 500 MG PO TABS: 500.0000 mg | ORAL_TABLET | Freq: Every day | ORAL | Status: DC

## 2012-11-18 NOTE — Progress Notes (Signed)
Nature conservation officer at [facility] [address] [place] [address] Phone: [phone] Fax: [phone]  Date:  11/18/2012   Name:  Michele Hall   DOB:  [birth year]   MRN:  [id number] Gender: female Age: 47 y.o.  Primary Physician:  Kerby Nora, MD  Evaluating MD: Hannah Beat, MD   Chief Complaint: Urinary Tract Infection   History of Present Illness:  Michele Hall. Picado is a 47 y.o. pleasant patient who presents with the following:  UTI, on sulfa for 1 week. No culture at the gynecologists office. Septra.  R sided CVAT. 2 weeks ago, the patient was seen her gynecologist office, and was felt to have a urinary tract infection was placed on one-week course of Septra. Unfortunately no culture was obtained. At that point she did have a moderate amount of blood and trace leukocyte esterase. She also does have a history significant for kidney stones she does not appear in any discomfort today in the office. She also is having some right-sided kidney pain and flank pain at the CVA region. Currently no fever.  Dr. Patsi Sears.   Patient Active Problem List   Diagnosis Date Noted  . Adenomyosis 08/03/2012  . Fibroids 08/03/2012  . ASCUS pap smear with negative high rish risk HPV 07/08/2012  . History of nephrolithiasis 10/27/2010  . VITAMIN B12 DEFICIENCY 07/18/2010  . ANEMIA, IRON DEFICIENCY 07/18/2010  . RESTLESS LEG SYNDROME 05/07/2010  . Abnormal uterine bleeding 10/29/2006  . HYPERLIPIDEMIA 10/27/2006  . OBESITY 10/27/2006  . HYPERTENSION 10/27/2006  . GERD 10/27/2006  . IBS 10/27/2006    Past Medical History  Diagnosis Date  . Hypertension   . Anemia   . Chronic kidney disease     stones  . GERD (gastroesophageal reflux disease)   . Morbid obesity     Past Surgical History  Procedure Laterality Date  . Cholecystectomy    . Tubal ligation    . Dilitation & currettage/hystroscopy with hydrothermal ablation N/A 09/13/2012    Procedure: DILATATION &  CURETTAGE/HYSTEROSCOPY WITH HYDROTHERMAL ABLATION;  Surgeon: Tereso Newcomer, MD;  Location: [facility];  Service: Gynecology;  Laterality: N/A;    History   Social History  . Marital Status: Married    Spouse Name: N/A    Number of Children: 2  . Years of Education: N/A   Occupational History  . house cleaner/ former bus driver    Social History Main Topics  . Smoking status: Former Smoker    Quit date: 02/09/2002  . Smokeless tobacco: Never Used  . Alcohol Use: No  . Drug Use: No  . Sexually Active: Yes -- Female partner(s)    Birth Control/ Protection: Surgical   Other Topics Concern  . Not on file   Social History Narrative  . No narrative on file    Family History  Problem Relation Age of Onset  . Stroke Mother   . Kidney disease Father   . Coronary artery disease Father   . Aneurysm Father   . Hypertension Father   . Seizures Sister   . Seizures Brother   . Heart attack Maternal Grandfather   . Seizures Sister   . Cancer Sister     ovarian/uterus  . Tongue cancer Sister     Allergies  Allergen Reactions  . Requip (Ropinirole Hcl) Other (See Comments)    Headache Feels "sick"    Medication list has been reviewed and updated.  Outpatient Prescriptions Prior to Visit  Medication Sig Dispense  Refill  . omeprazole (PRILOSEC) 40 MG capsule Take 1 capsule (40 mg total) by mouth daily.  30 capsule  11  . spironolactone (ALDACTONE) 25 MG tablet Take 1 tablet (25 mg total) by mouth daily.  90 tablet  3  . acetaminophen (TYLENOL) 500 MG tablet Take 500 mg by mouth every 6 (six) hours as needed for pain (For headache.).      Marland Kitchen ibuprofen (ADVIL,MOTRIN) 600 MG tablet Take 1 tablet (600 mg total) by mouth every 6 (six) hours as needed for pain.  30 tablet  1  . loratadine (CLARITIN) 10 MG tablet Take 10 mg by mouth daily.      . flavoxATE (URISPAS) 100 MG tablet Take 1 tablet (100 mg total) by mouth 3 (three) times daily as needed (bladder spasm).  10 tablet  1  .  sulfamethoxazole-trimethoprim (BACTRIM DS,SEPTRA DS) 800-160 MG per tablet Take 1 tablet by mouth 2 (two) times daily.  14 tablet  0   No facility-administered medications prior to visit.    Review of Systems:  ROS: GEN: Acute illness details above GI: Tolerating PO intake GU: maintaining adequate hydration and urination Pulm: No SOB Interactive and getting along well at home.  Otherwise, ROS is as per the HPI.   Physical Examination: BP 130/70  Pulse 87  Temp(Src) 98.2 F (36.8 C) (Oral)  Ht 5\' 6"  (1.676 m)  Wt 260 lb 12 oz (118.275 kg)  BMI 42.11 kg/m2  SpO2 98%  Ideal Body Weight: Weight in (lb) to have BMI = 25: 154.6   GEN: WDWN, A&Ox4,NAD. Non-toxic HEENT: Atraumatc, normocephalic. CV: RRR, No M/G/R PULM: CTA B, No wheezes, crackles, or rhonchi ABD: S, NT, ND, +BS, no rebound. + CVAT on R. No suprapubic tenderness. EXT: No c/c/e   Assessment and Plan:  Acute pyelonephritis  Dysuria - Plan: POCT Urinalysis Dipstick, Urine Culture  History of kidney stones  Most c/w R sided pyelo Cannot rule out stone Check cx LVQ x 10 days  If more pain and not improving after culture, will need to image kidneys  Results for orders placed in visit on 11/18/12  POCT URINALYSIS DIPSTICK      Result Value Range   Color, UA YELLOW     Clarity, UA CLEAR     Glucose, UA NEG     Bilirubin, UA NEG     Ketones, UA NEG     Spec Grav, UA 1.020     Blood, UA MODERATE     pH, UA 6.0     Protein, UA NEG     Urobilinogen, UA negative     Nitrite, UA NEG     Leukocytes, UA small (1+)       Orders Today:  Orders Placed This Encounter  Procedures  . Urine Culture  . POCT Urinalysis Dipstick    Updated Medication List: (Includes new medications, updates to list, dose adjustments) Meds ordered this encounter  Medications  . levofloxacin (LEVAQUIN) 500 MG tablet    Sig: Take 1 tablet (500 mg total) by mouth daily.    Dispense:  10 tablet    Refill:  0    Medications  Discontinued: Medications Discontinued During This Encounter  Medication Reason  . sulfamethoxazole-trimethoprim (BACTRIM DS,SEPTRA DS) 800-160 MG per tablet Error  . flavoxATE (URISPAS) 100 MG tablet Error      Signed, Kweku Stankey T. Terrina Docter, MD 11/18/2012 4:20 PM

## 2012-11-18 NOTE — Telephone Encounter (Signed)
Patient Information:  Caller Name: Aanika  Phone: [phone]  Patient: Michele Hall, Michele Hall  Gender: Female  DOB: 12-06-1965  Age: 47 Years  PCP: Kerby Nora (Family Practice)  Pregnant: No  Office Follow Up:  Does the office need to follow up with this patient?: No  Instructions For The Office: N/A   Symptoms  Reason For Call & Symptoms: Pt calling regarding f/u after treatment for UTI since 11/04/12. Pt does have a hx of kidney stones, but does not feel like that now. Pain is difficult to relieve  Reviewed Health History In EMR: Yes  Reviewed Medications In EMR: Yes  Reviewed Allergies In EMR: Yes  Reviewed Surgeries / Procedures: Yes  Date of Onset of Symptoms: 11/04/2012  Treatments Tried: Antbx on 11/04/12  Treatments Tried Worked: No OB / GYN:  LMP: Unknown  Guideline(s) Used:  Urination Pain - Female  Disposition Per Guideline:   Go to Office Now  Reason For Disposition Reached:   Side (flank) or lower back pain present  Advice Given:  N/A  Patient Will Follow Care Advice:  YES  Appointment Scheduled:  11/18/2012 16:00:00 Appointment Scheduled Provider:  Hannah Beat (Family Practice)

## 2012-11-20 ENCOUNTER — Encounter (HOSPITAL_COMMUNITY): Payer: Self-pay | Admitting: Emergency Medicine

## 2012-11-20 ENCOUNTER — Emergency Department (HOSPITAL_COMMUNITY)
Admission: EM | Admit: 2012-11-20 | Discharge: 2012-11-20 | Disposition: A | Discharge status: Home / Self Care | Payer: BC Managed Care – PPO | Attending: Emergency Medicine | Admitting: Emergency Medicine

## 2012-11-20 ENCOUNTER — Emergency Department (HOSPITAL_COMMUNITY): Payer: BC Managed Care – PPO

## 2012-11-20 DIAGNOSIS — N390 Urinary tract infection, site not specified: Secondary | ICD-10-CM | POA: Insufficient documentation

## 2012-11-20 DIAGNOSIS — Z87891 Personal history of nicotine dependence: Secondary | ICD-10-CM | POA: Insufficient documentation

## 2012-11-20 DIAGNOSIS — R109 Unspecified abdominal pain: Secondary | ICD-10-CM | POA: Insufficient documentation

## 2012-11-20 DIAGNOSIS — N189 Chronic kidney disease, unspecified: Secondary | ICD-10-CM | POA: Insufficient documentation

## 2012-11-20 DIAGNOSIS — Z87442 Personal history of urinary calculi: Secondary | ICD-10-CM | POA: Insufficient documentation

## 2012-11-20 DIAGNOSIS — I129 Hypertensive chronic kidney disease with stage 1 through stage 4 chronic kidney disease, or unspecified chronic kidney disease: Secondary | ICD-10-CM | POA: Insufficient documentation

## 2012-11-20 DIAGNOSIS — K219 Gastro-esophageal reflux disease without esophagitis: Secondary | ICD-10-CM | POA: Insufficient documentation

## 2012-11-20 DIAGNOSIS — Z9089 Acquired absence of other organs: Secondary | ICD-10-CM | POA: Insufficient documentation

## 2012-11-20 DIAGNOSIS — Z862 Personal history of diseases of the blood and blood-forming organs and certain disorders involving the immune mechanism: Secondary | ICD-10-CM | POA: Insufficient documentation

## 2012-11-20 DIAGNOSIS — Z79899 Other long term (current) drug therapy: Secondary | ICD-10-CM | POA: Insufficient documentation

## 2012-11-20 LAB — POCT I-STAT, CHEM 8
BUN: 11 mg/dL (ref 6–23)
Calcium, Ion: 1.15 mmol/L (ref 1.12–1.23)
Chloride: 107 mEq/L (ref 96–112)
Creatinine, Ser: 0.9 mg/dL (ref 0.50–1.10)
Glucose, Bld: 87 mg/dL (ref 70–99)
HCT: 33 % — ABNORMAL LOW (ref 36.0–46.0)
Potassium: 3.7 mEq/L (ref 3.5–5.1)

## 2012-11-20 LAB — URINALYSIS, ROUTINE W REFLEX MICROSCOPIC
Bilirubin Urine: NEGATIVE
Glucose, UA: NEGATIVE mg/dL
Hgb urine dipstick: NEGATIVE
Ketones, ur: NEGATIVE mg/dL
Specific Gravity, Urine: 1.017 (ref 1.005–1.030)
pH: 6.5 (ref 5.0–8.0)

## 2012-11-20 LAB — URINE CULTURE: Organism ID, Bacteria: NO GROWTH

## 2012-11-20 IMAGING — CT CT ABD-PELV W/O CM
1 series · 15 of 28 positions shown, 19 images · non-contrast
Comparison: CT abdomen pelvis [DATE]

CLINICAL DATA: Right flank pain.  The patient reports diagnosis of
urinary tract back and [DATE].

CT ABDOMEN AND PELVIS WITHOUT CONTRAST
TECHNIQUE: Multidetector CT imaging of the abdomen and pelvis was
performed following the standard protocol without intravenous
contrast.

[Series 6: lung · axial · 0.82mm/px · z∈[+362,+482]mm · 15 of 28 slices shown, 19 images]
[im 3/28  soft-tissue]
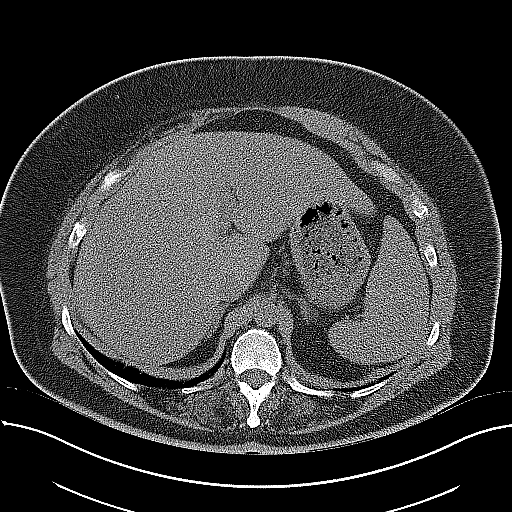
[im 3/28  bone]
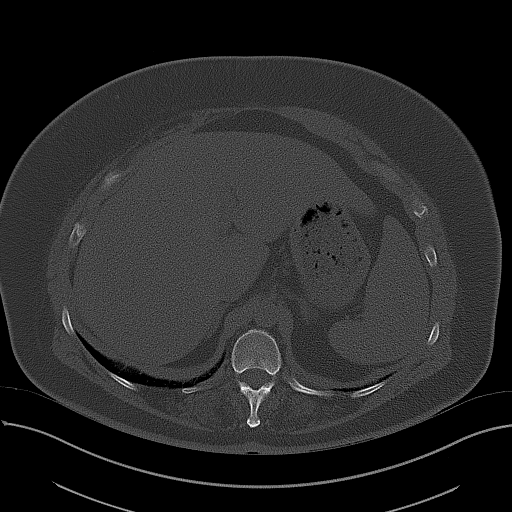
[im 5/28  soft-tissue]
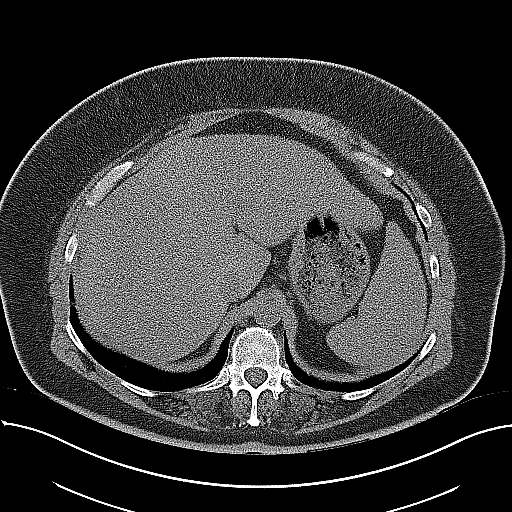
[im 7/28  soft-tissue]
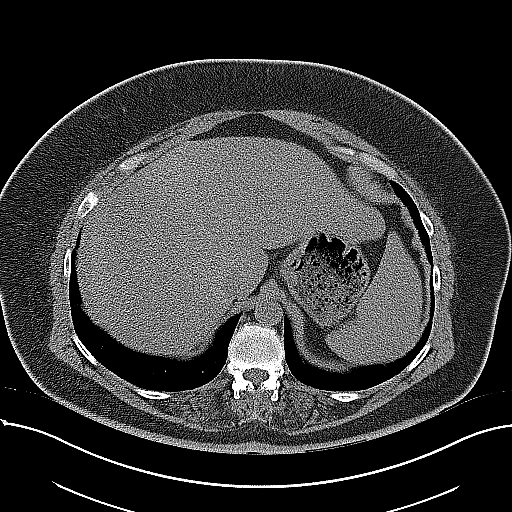
[im 9/28  soft-tissue]
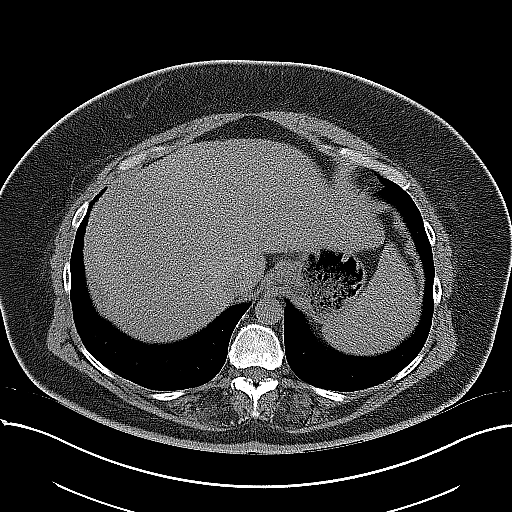
[im 11/28  soft-tissue]
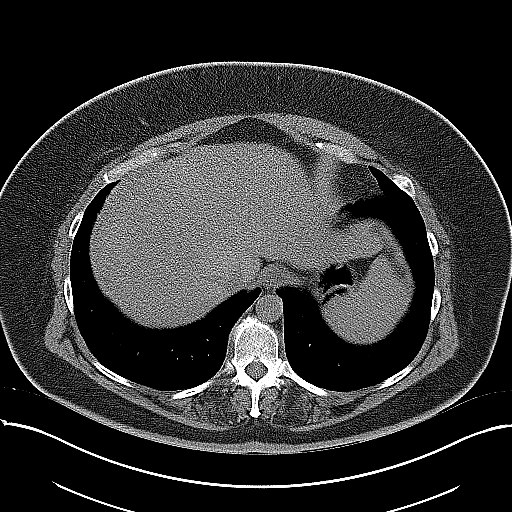
[im 13/28  soft-tissue]
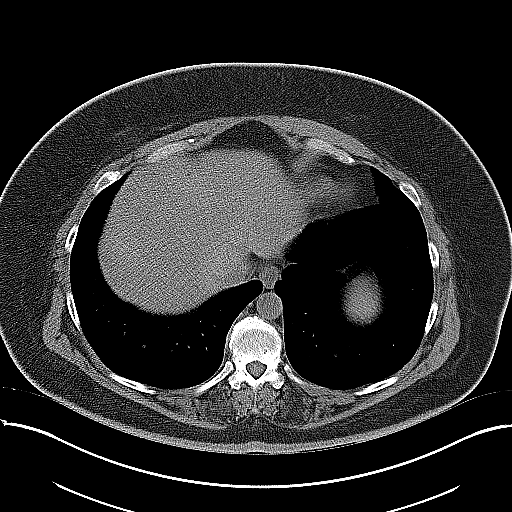
[im 15/28  soft-tissue]
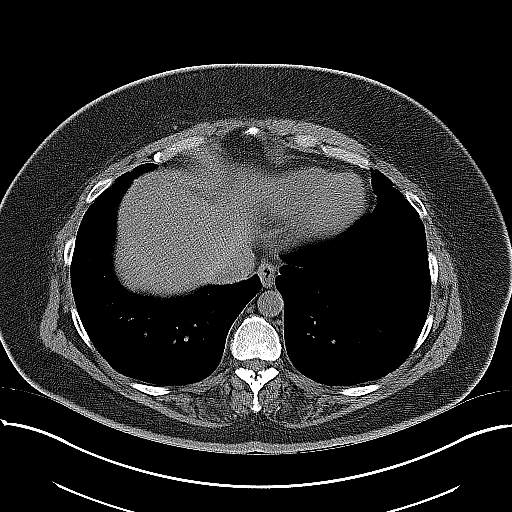
[im 17/28  soft-tissue]
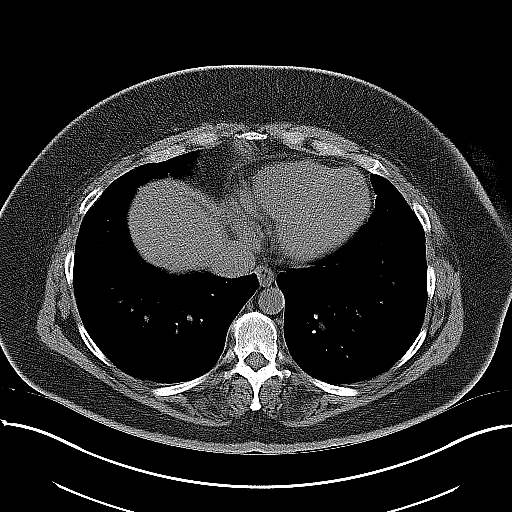
[im 19/28  soft-tissue]
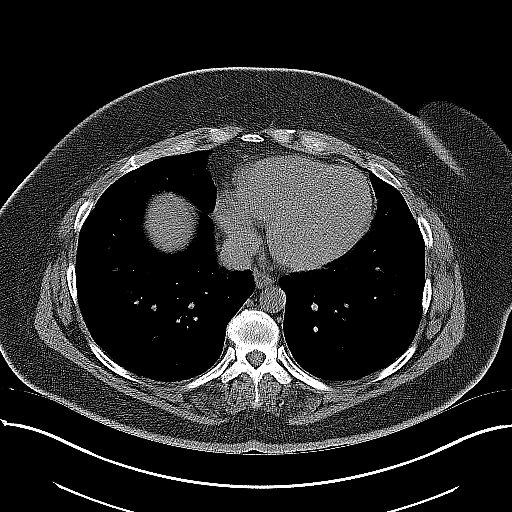
[im 19/28  bone]
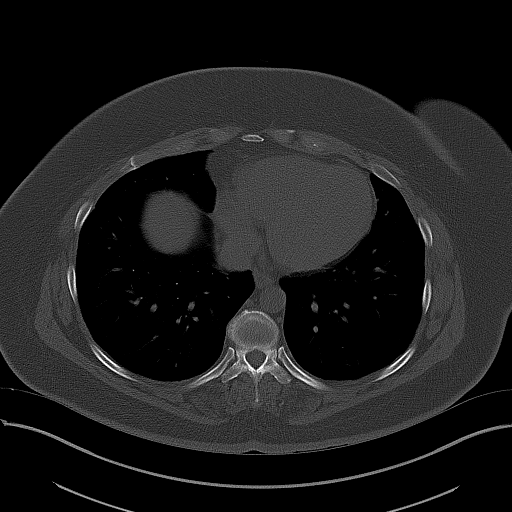
[im 21/28  soft-tissue]
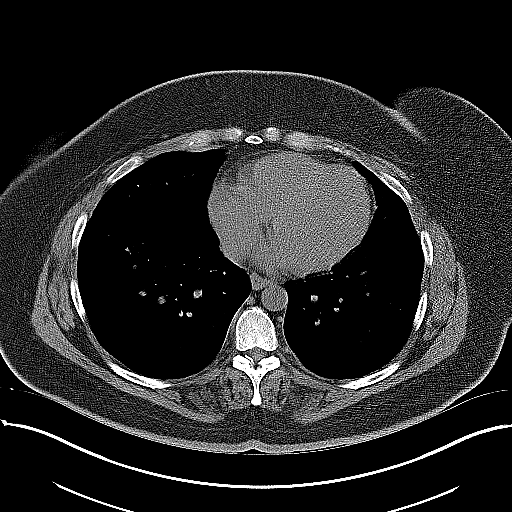
[im 23/28  soft-tissue]
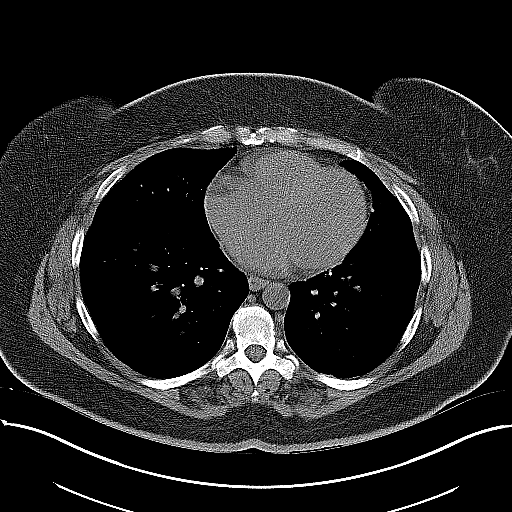
[im 24/28  lung]
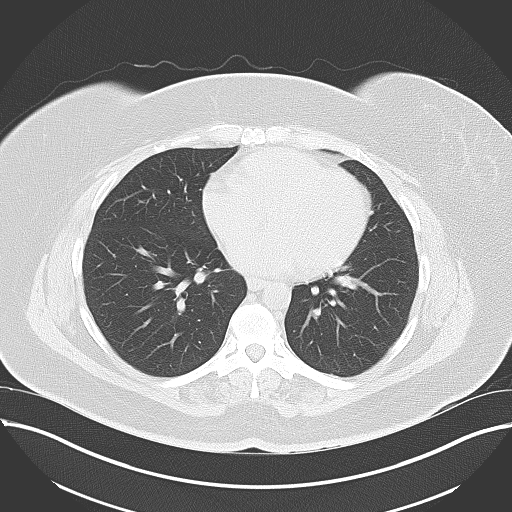
[im 25/28  soft-tissue]
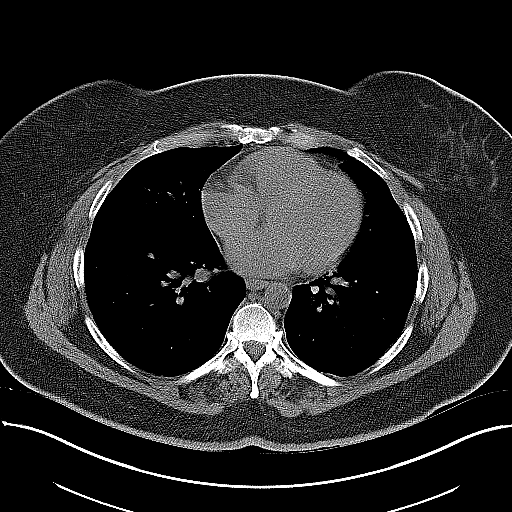
[im 25/28  lung]
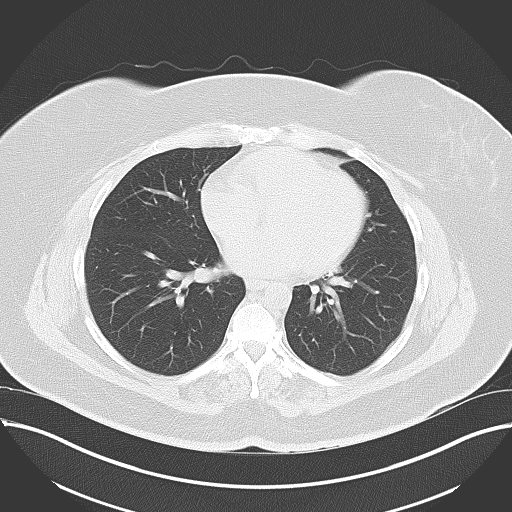
[im 26/28  lung]
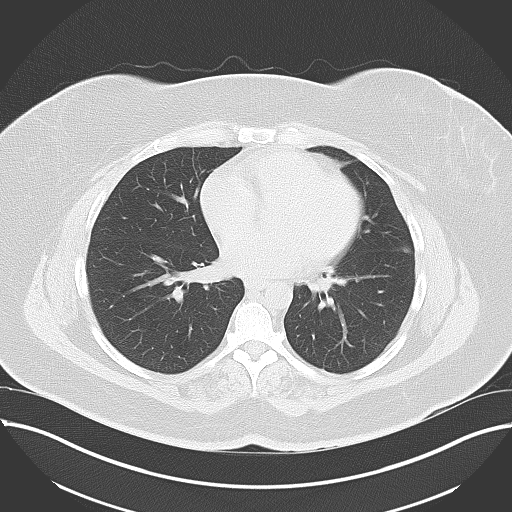
[im 27/28  soft-tissue]
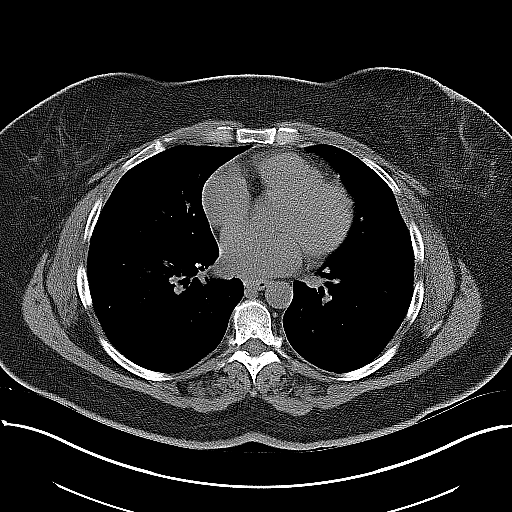
[im 27/28  lung]
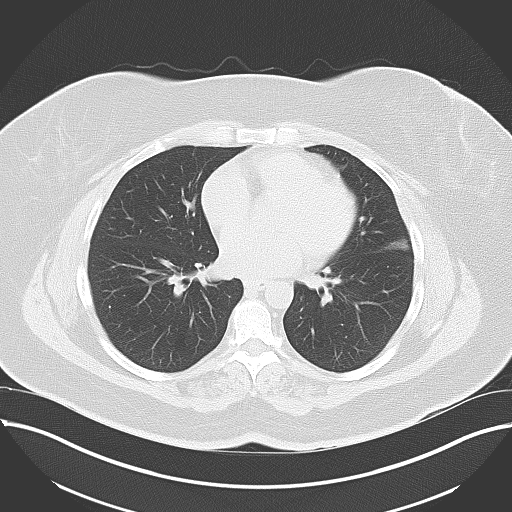

[15 of 28 positions shown; findings below may reference images not displayed]

FINDINGS: Lung bases:  Linear scar or atelectasis in the lingula.
Otherwise, the imaged lung bases are clear.  Negative for pleural
effusion.

There are multiple bilateral renal calculi. There are approximately
six stones in the right renal collecting system they range from
punctate in size to 8 mm. There is no hydronephrosis or perinephric
stranding on the right.  The renal cortex appears preserved.

In the left kidney there are approximately five stones that arrange
range in size from punctate to 4mm.  There is no hydronephrosis or
perinephric stranding on the left.  There is focal cortical
scarring posteriorly in the left upper pole of the left kidney.
Amorphous calcification in the region of scarring is stable and
could reflect focal parenchymal scarring or a stone.

Both ureters are normal in caliber.  No ureteral stone or
periureteric stranding is identified.  The urinary bladder is not
very distended at the time imaging demonstrates normal wall
thickness.  No bladder stones.

The noncontrast appearance of the liver, spleen, adrenal glands,
and pancreas is within normal limits.  Stable 15 mm soft tissue
nodule just inferior to the spleen is most consistent with a benign
splenule. Status post cholecystectomy.  Negative for biliary ductal
dilatation.

Stomach and contains food particles and is nondistended.  Small
bowel loops within normal limits for caliber.  The appendix is
normal.  Colon is normal in caliber.  The uterus and ovaries are
within normal limits.  No adnexal mass.  13 mm portacaval lymph
node is stable.  Otherwise, no enlarged abdominal or pelvic lymph
nodes.  Abdominal aorta is normal in caliber.

No acute or suspicious bony abnormality.
IMPRESSION: 1.  Bilateral nonobstructing renal calculi.
2.  The ureters are normal in caliber.  No ureteral or bladder
stones are visualized.
3.  Cholecystectomy
4.  Stable scarring in the upper pole of the left kidney.

## 2012-11-20 MED ORDER — DIAZEPAM 5 MG/ML IJ SOLN
5.0000 mg | Freq: Once | INTRAMUSCULAR | Status: AC
Start: 2012-11-20 — End: 2012-11-20
  Administered 2012-11-20: 5 mg via INTRAVENOUS
  Filled 2012-11-20: qty 2

## 2012-11-20 MED ORDER — ONDANSETRON HCL 4 MG/2ML IJ SOLN
4.0000 mg | Freq: Once | INTRAMUSCULAR | Status: AC
  Administered 2012-11-20: 4 mg via INTRAVENOUS
  Filled 2012-11-20: qty 2

## 2012-11-20 MED ORDER — HYDROMORPHONE HCL PF 1 MG/ML IJ SOLN
1.0000 mg | Freq: Once | INTRAMUSCULAR | Status: AC
  Administered 2012-11-20: 1 mg via INTRAVENOUS
  Filled 2012-11-20: qty 1

## 2012-11-20 MED ORDER — CYCLOBENZAPRINE HCL 5 MG PO TABS: 5.0000 mg | ORAL_TABLET | Freq: Two times a day (BID) | ORAL | Status: DC | PRN

## 2012-11-20 MED ORDER — HYDROCODONE-ACETAMINOPHEN 5-325 MG PO TABS: 1.0000 | ORAL_TABLET | ORAL | Status: DC | PRN

## 2012-11-20 NOTE — ED Provider Notes (Signed)
Medical screening examination/treatment/procedure(s) were performed by non-physician practitioner and as supervising physician I was immediately available for consultation/collaboration.  Doug Sou, MD 11/20/12 2142

## 2012-11-20 NOTE — ED Notes (Signed)
Pt from home reports that she had uterine ablation the first week of May, was dx with UTI soon after. Took abx but has not relieved s/sx. Pt now having R flank pain and reports that she is" not going normally". Pt denies dysuria, fever, N/V. Pt A&O and in NAD

## 2012-11-20 NOTE — ED Provider Notes (Signed)
History     CSN: [id number]  Arrival date & time 11/20/12  1643   First MD Initiated Contact with Patient 11/20/12 1755      Chief Complaint  Patient presents with  . Flank Pain    (Consider location/radiation/quality/duration/timing/severity/associated sxs/prior treatment) HPI Comments: Pt states that she has been having right flank pain for the last couple of days:pt states that she is on her second round of antibiotics for a uti:pt states that she does have a history of kidney stone, but it has been a while and this is not necessarily similar  Patient is a 47 y.o. female presenting with flank pain. The history is provided by the patient. No language interpreter was used.  Flank Pain This is a new problem. The current episode started in the past 7 days. The problem occurs constantly. The problem has been unchanged. Pertinent negatives include no abdominal pain, coughing, fever or nausea. Nothing aggravates the symptoms. She has tried nothing for the symptoms.    Past Medical History  Diagnosis Date  . Hypertension   . Anemia   . Chronic kidney disease     stones  . GERD (gastroesophageal reflux disease)   . Morbid obesity     Past Surgical History  Procedure Laterality Date  . Cholecystectomy    . Tubal ligation    . Dilitation & currettage/hystroscopy with hydrothermal ablation N/A 09/13/2012    Procedure: DILATATION & CURETTAGE/HYSTEROSCOPY WITH HYDROTHERMAL ABLATION;  Surgeon: Tereso Newcomer, MD;  Location: [facility];  Service: Gynecology;  Laterality: N/A;    Family History  Problem Relation Age of Onset  . Stroke Mother   . Kidney disease Father   . Coronary artery disease Father   . Aneurysm Father   . Hypertension Father   . Seizures Sister   . Seizures Brother   . Heart attack Maternal Grandfather   . Seizures Sister   . Cancer Sister     ovarian/uterus  . Tongue cancer Sister     History  Substance Use Topics  . Smoking status: Former Smoker    Quit  date: 02/09/2002  . Smokeless tobacco: Never Used  . Alcohol Use: No    OB History   Grav Para Term Preterm Abortions TAB SAB Ect Mult Living   2 2 2       2       Review of Systems  Constitutional: Negative for fever.  Respiratory: Negative for cough.   Cardiovascular: Negative.   Gastrointestinal: Negative for nausea and abdominal pain.  Genitourinary: Positive for flank pain.    Allergies  Requip  Home Medications   Current Outpatient Rx  Name  Route  Sig  Dispense  Refill  . acetaminophen (TYLENOL) 500 MG tablet   Oral   Take 500 mg by mouth every 6 (six) hours as needed for pain (For headache.).         Marland Kitchen levofloxacin (LEVAQUIN) 500 MG tablet   Oral   Take 1 tablet (500 mg total) by mouth daily.   10 tablet   0   . loratadine (CLARITIN) 10 MG tablet   Oral   Take 10 mg by mouth daily.         Marland Kitchen omeprazole (PRILOSEC) 40 MG capsule   Oral   Take 1 capsule (40 mg total) by mouth daily.   30 capsule   11   . spironolactone (ALDACTONE) 25 MG tablet   Oral   Take 1 tablet (25 mg total) by  mouth daily.   90 tablet   3     Pt needs to call for appt.     BP 130/53  Pulse 94  Temp(Src) 98.9 F (37.2 C) (Oral)  Resp 16  SpO2 98%  Physical Exam  Nursing note and vitals reviewed. Constitutional: She is oriented to person, place, and time. She appears well-developed and well-nourished.  HENT:  Head: Normocephalic and atraumatic.  Eyes: Conjunctivae and EOM are normal.  Neck: Neck supple.  Cardiovascular: Normal rate and regular rhythm.   Pulmonary/Chest: Effort normal and breath sounds normal.  Abdominal: Soft. Normal appearance and bowel sounds are normal. There is no tenderness. There is no CVA tenderness.  Musculoskeletal: Normal range of motion.  Pt tender in the right paraspinal tenderness  Neurological: She is alert and oriented to person, place, and time.  Skin: Skin is warm and dry.  Psychiatric: She has a normal mood and affect.    ED  Course  Procedures (including critical care time)  Labs Reviewed  URINALYSIS, ROUTINE W REFLEX MICROSCOPIC - Abnormal; Notable for the following:    APPearance CLOUDY (*)    All other components within normal limits  POCT I-STAT, CHEM 8 - Abnormal; Notable for the following:    Hemoglobin 11.2 (*)    HCT 33.0 (*)    All other components within normal limits   Ct Abdomen Pelvis Wo Contrast  11/20/2012   *RADIOLOGY REPORT*  Clinical Data: Right flank pain.  The patient reports diagnosis of urinary tract back and May 2014.  CT ABDOMEN AND PELVIS WITHOUT CONTRAST  Technique:  Multidetector CT imaging of the abdomen and pelvis was performed following the standard protocol without intravenous contrast.  Comparison: CT abdomen pelvis 10/27/2010  Findings: Lung bases:  Linear scar or atelectasis in the lingula. Otherwise, the imaged lung bases are clear.  Negative for pleural effusion.  There are multiple bilateral renal calculi. There are approximately six stones in the right renal collecting system they range from punctate in size to 8 mm. There is no hydronephrosis or perinephric stranding on the right.  The renal cortex appears preserved.  In the left kidney there are approximately five stones that arrange range in size from punctate to 4mm.  There is no hydronephrosis or perinephric stranding on the left.  There is focal cortical scarring posteriorly in the left upper pole of the left kidney. Amorphous calcification in the region of scarring is stable and could reflect focal parenchymal scarring or a stone.  Both ureters are normal in caliber.  No ureteral stone or periureteric stranding is identified.  The urinary bladder is not very distended at the time imaging demonstrates normal wall thickness.  No bladder stones.  The noncontrast appearance of the liver, spleen, adrenal glands, and pancreas is within normal limits.  Stable 15 mm soft tissue nodule just inferior to the spleen is most consistent with a  benign splenule. Status post cholecystectomy.  Negative for biliary ductal dilatation.  Stomach and contains food particles and is nondistended.  Small bowel loops within normal limits for caliber.  The appendix is normal.  Colon is normal in caliber.  The uterus and ovaries are within normal limits.  No adnexal mass.  13 mm portacaval lymph node is stable.  Otherwise, no enlarged abdominal or pelvic lymph nodes.  Abdominal aorta is normal in caliber.  No acute or suspicious bony abnormality.  IMPRESSION:  1.  Bilateral nonobstructing renal calculi. 2.  The ureters are normal in caliber.  No ureteral or bladder stones are visualized. 3.  Cholecystectomy 4.  Stable scarring in the upper pole of the left kidney.   Original Report Authenticated By: Britta Mccreedy, M.D.     1. Flank pain       MDM  No acute findings noted:pt is okay to follow up with pcp as needed:pt tender to palpation:will treat with vicodin and flexeril:considered shingles no rash visualized        Teressa Lower, NP 11/20/12 1951  Teressa Lower, NP 11/20/12 2001

## 2012-11-21 ENCOUNTER — Telehealth: Payer: Self-pay

## 2012-11-21 NOTE — Telephone Encounter (Signed)
Record Num: [id number] Operator: Frederico Hamman Patient Name: Riyana Biel Call Date & Time: 11/20/2012 3:11:02PM Patient Phone: [phone] PCP: Kerby Nora Patient Gender: Female PCP Fax : [phone] Patient DOB: 27-May-1966 Practice Name: [facility] Reason for Call: LMP-April 2014 Isabellah states she was seen in office on 11/18/12 due to flank pain. Was started on Levaquin 500mg  daily x 10 days. Lateria states back and side pain is severe. Rates pain an 8 or 9 on 1/10 pt scale.Voiding less than normal. Has not checked temperature. Urine culture done 11/18/12 had no growth. Per flank pain protocol has see provider within 4 hrs due to flank and back pain and urinary tract symptoms. Has history of kidney stones. Advised to go to [facility] ED and F/U with office. Care advice given. Protocol(s) Used: Flank Pain Recommended Outcome per Protocol: See Provider within 4 hours Reason for Outcome: Flank pain or low back pain AND urinary tract symptoms Care Advice: ~ Another adult should drive. ~ Call provider if symptoms worsen or new symptoms develop. ~ SYMPTOM / CONDITION MANAGEMENT Increase intake of fluids. Try to drink 8 oz. (.2 liter) every hour when awake, unless on restricted fluids for other medical reasons. Include at least two 8 oz. (.2 liter) glasses of unsweetened cranberry juice each day. Take sips of fluid or eat ice chips if nauseated or vomiting. ~ 06/

## 2012-11-21 NOTE — Telephone Encounter (Signed)
Can you call and check on patient --- her culture negative. I wanted to check on her dispo. Did she go to ER. She prob needs to be seen. I don't see how I can this afternoon. If a slot open in our office, then that would be ok.

## 2012-11-21 NOTE — Telephone Encounter (Signed)
Patient went to er and diagnosed with kidney stones

## 2013-02-08 ENCOUNTER — Other Ambulatory Visit: Payer: Self-pay | Admitting: Family Medicine

## 2013-03-09 ENCOUNTER — Encounter: Payer: Self-pay | Admitting: Internal Medicine

## 2013-03-09 ENCOUNTER — Ambulatory Visit (INDEPENDENT_AMBULATORY_CARE_PROVIDER_SITE_OTHER): Payer: BC Managed Care – PPO | Admitting: Internal Medicine

## 2013-03-09 VITALS — BP 126/86 | HR 105 | Temp 98.5°F | Ht 66.0 in | Wt 254.5 lb

## 2013-03-09 DIAGNOSIS — J019 Acute sinusitis, unspecified: Secondary | ICD-10-CM

## 2013-03-09 DIAGNOSIS — J309 Allergic rhinitis, unspecified: Secondary | ICD-10-CM

## 2013-03-09 MED ORDER — METHYLPREDNISOLONE ACETATE 80 MG/ML IJ SUSP
80.0000 mg | Freq: Once | INTRAMUSCULAR | Status: AC
  Administered 2013-03-09: 80 mg via INTRAMUSCULAR

## 2013-03-09 MED ORDER — AZITHROMYCIN 250 MG PO TABS: ORAL_TABLET | ORAL | Status: DC

## 2013-03-09 NOTE — Addendum Note (Signed)
Addended by: Darnell Level on: 03/09/2013 03:59 PM   Modules accepted: Orders

## 2013-03-09 NOTE — Addendum Note (Signed)
Addended by: Darnell Level on: 03/09/2013 04:13 PM   Modules accepted: Orders

## 2013-03-09 NOTE — Progress Notes (Addendum)
HPI  Pt presents to the clinic today with c/o headache, nasal congestion, sore throat and bilateral ear pain. It seemed to get worse 4 days ago but she has been dealing with it since August. She is blowing thick green mucous out of her nose. She denies fever but has had chill and body aches. She does have a history of allergies but denies breathing problems. She was taking a generic antihistamine daily, she has not taken anything else OTC. She reports that she is prone to sinus infection yearly. She denies having sick contacts.  Review of Systems    Past Medical History  Diagnosis Date  . Hypertension   . Anemia   . Chronic kidney disease     stones  . GERD (gastroesophageal reflux disease)   . Morbid obesity     Family History  Problem Relation Age of Onset  . Stroke Mother   . Kidney disease Father   . Coronary artery disease Father   . Aneurysm Father   . Hypertension Father   . Seizures Sister   . Seizures Brother   . Heart attack Maternal Grandfather   . Seizures Sister   . Cancer Sister     ovarian/uterus  . Tongue cancer Sister     History   Social History  . Marital Status: Married    Spouse Name: N/A    Number of Children: 2  . Years of Education: N/A   Occupational History  . house cleaner/ former bus driver    Social History Main Topics  . Smoking status: Former Smoker    Quit date: 02/09/2002  . Smokeless tobacco: Never Used  . Alcohol Use: No  . Drug Use: No  . Sexual Activity: Yes    Partners: Male    Birth Control/ Protection: Surgical   Other Topics Concern  . Not on file   Social History Narrative  . No narrative on file    Allergies  Allergen Reactions  . Requip [Ropinirole Hcl] Other (See Comments)    Headache Feels "sick"     Constitutional: Positive headache, fatigue and fever. Denies fever or abrupt weight changes.  HEENT:  Positive eye pain, pressure behind the eyes, facial pain, nasal congestion and sore throat. Denies eye  redness, ear pain, ringing in the ears, wax buildup, runny nose or bloody nose. Respiratory: Positive cough and thick green sputum production. Denies difficulty breathing or shortness of breath.  Cardiovascular: Denies chest pain, chest tightness, palpitations or swelling in the hands or feet.   No other specific complaints in a complete review of systems (except as listed in HPI above).  Objective:    BP 126/86  Pulse 105  Temp(Src) 98.5 F (36.9 C) (Oral)  Ht 5\' 6"  (1.676 m)  Wt 254 lb 8 oz (115.44 kg)  BMI 41.1 kg/m2  SpO2 98% Wt Readings from Last 3 Encounters:  03/09/13 254 lb 8 oz (115.44 kg)  11/18/12 260 lb 12 oz (118.275 kg)  11/04/12 258 lb (117.028 kg)    General: Appears her stated age, obese but well developed, well nourished in NAD. HEENT: Head: normal shape and size; Eyes: sclera white, no icterus, conjunctiva pink, PERRLA and EOMs intact; Ears: Tm's gray and intact, normal light reflex; Nose: mucosa pink and moist, septum midline; Throat/Mouth: + PND. Teeth present, mucosa pink and moist, no exudate noted, no lesions or ulcerations noted.  Neck: Mild cervical lymphadenopathy. Neck supple, trachea midline. No massses, lumps or thyromegaly present.  Cardiovascular: Normal rate  and rhythm. S1,S2 noted.  No murmur, rubs or gallops noted. No JVD or BLE edema. No carotid bruits noted. Pulmonary/Chest: Normal effort and positive vesicular breath sounds. No respiratory distress. No wheezes, rales or ronchi noted.      Assessment & Plan:   Acute bacterial sinusitis  Can use a Neti Pot which can be purchased from your local drug store. Continue OTC antihistamine eRx for azithromax x 5 days 80 mg Depo IM today  RTC as needed or if symptoms persist.

## 2013-03-09 NOTE — Patient Instructions (Signed)

## 2013-04-19 ENCOUNTER — Telehealth: Payer: Self-pay

## 2013-04-19 NOTE — Telephone Encounter (Signed)
Pt does not see any rash or bumps on skin but pt is itching on arms and legs; pt's son and grandchildren have been dx with scabies. Pt said she wants treatment even thou cannot see rash but is itching. Pt does not want to schedule appt and med sent to [facility].pt request cb.

## 2013-04-20 MED ORDER — PERMETHRIN 5 % EX CREA: TOPICAL_CREAM | CUTANEOUS | Status: DC

## 2013-04-20 NOTE — Telephone Encounter (Signed)
Prescription called to [facility]  Mrs. Michele Hall notified that prescription has been called to pharmacy and directions on how to use the cream was given. Patient states understanding. 

## 2013-05-14 ENCOUNTER — Other Ambulatory Visit: Payer: Self-pay | Admitting: Family Medicine

## 2013-05-25 ENCOUNTER — Ambulatory Visit (INDEPENDENT_AMBULATORY_CARE_PROVIDER_SITE_OTHER): Payer: BC Managed Care – PPO | Admitting: Family Medicine

## 2013-05-25 ENCOUNTER — Encounter: Payer: Self-pay | Admitting: Family Medicine

## 2013-05-25 VITALS — BP 116/70 | HR 81 | Temp 98.3°F | Ht 66.0 in | Wt 252.5 lb

## 2013-05-25 DIAGNOSIS — H6983 Other specified disorders of Eustachian tube, bilateral: Secondary | ICD-10-CM

## 2013-05-25 DIAGNOSIS — H6980 Other specified disorders of Eustachian tube, unspecified ear: Secondary | ICD-10-CM

## 2013-05-25 DIAGNOSIS — I10 Essential (primary) hypertension: Secondary | ICD-10-CM

## 2013-05-25 DIAGNOSIS — H6982 Other specified disorders of Eustachian tube, left ear: Secondary | ICD-10-CM | POA: Insufficient documentation

## 2013-05-25 MED ORDER — SPIRONOLACTONE 50 MG PO TABS: ORAL_TABLET | ORAL | Status: DC

## 2013-05-25 MED ORDER — FLUTICASONE FUROATE 27.5 MCG/SPRAY NA SUSP: 2.0000 | Freq: Every day | NASAL | Status: DC

## 2013-05-25 NOTE — Progress Notes (Signed)
   Subjective:    Patient ID: Michele Hall, female    DOB: September 29, 1965, 47 y.o.   MRN: [id number]  HPI  47 year old female with HTN presents with recent dizzy spells , has noted BP has been elevated 150/100s.  Has been ongoing for a few weeks.  No chest pain, no SOB. No swelling. Mild headache.  She took two spirolactone today. No new neuro changes.  No stress, no anxiety.  (In past on HCTZ for fluid but had low potassium)  BP Readings from Last 3 Encounters:  05/25/13 116/70  03/09/13 126/86  11/20/12 131/85   Wt Readings from Last 3 Encounters:  05/25/13 252 lb 8 oz (114.533 kg)  03/09/13 254 lb 8 oz (115.44 kg)  11/18/12 260 lb 12 oz (118.275 kg)       Review of Systems  Constitutional: Negative for fever and fatigue.  HENT: Negative for ear pain.   Eyes: Negative for pain.  Respiratory: Negative for chest tightness and shortness of breath.   Cardiovascular: Negative for chest pain, palpitations and leg swelling.  Gastrointestinal: Negative for abdominal pain.  Genitourinary: Negative for dysuria.       Objective:   Physical Exam  Constitutional: Vital signs are normal. She appears well-developed and well-nourished. She is cooperative.  Non-toxic appearance. She does not appear ill. No distress.  HENT:  Head: Normocephalic.  Right Ear: Hearing, external ear and ear canal normal. Tympanic membrane is not erythematous, not retracted and not bulging. A middle ear effusion is present.  Left Ear: Hearing, tympanic membrane, external ear and ear canal normal. Tympanic membrane is not erythematous, not retracted and not bulging.  No middle ear effusion.  Nose: No mucosal edema or rhinorrhea. Right sinus exhibits no maxillary sinus tenderness and no frontal sinus tenderness. Left sinus exhibits no maxillary sinus tenderness and no frontal sinus tenderness.  Mouth/Throat: Uvula is midline, oropharynx is clear and moist and mucous membranes are normal.  Eyes: Conjunctivae,  EOM and lids are normal. Pupils are equal, round, and reactive to light. Lids are everted and swept, no foreign bodies found.  Neck: Trachea normal and normal range of motion. Neck supple. Carotid bruit is not present. No mass and no thyromegaly present.  Cardiovascular: Normal rate, regular rhythm, S1 normal, S2 normal, normal heart sounds, intact distal pulses and normal pulses.  Exam reveals no gallop and no friction rub.   No murmur heard. Pulmonary/Chest: Effort normal and breath sounds normal. Not tachypneic. No respiratory distress. She has no decreased breath sounds. She has no wheezes. She has no rhonchi. She has no rales.  Abdominal: Soft. Normal appearance and bowel sounds are normal. There is no tenderness.  Neurological: She is alert.  Skin: Skin is warm, dry and intact. No rash noted.  Psychiatric: Her speech is normal and behavior is normal. Judgment and thought content normal. Her mood appears not anxious. Cognition and memory are normal. She does not exhibit a depressed mood.          Assessment & Plan:

## 2013-05-25 NOTE — Patient Instructions (Addendum)
Stop at lab on way out. Increase spirolactone to 50 mg daily. Follow BP at home, goal <140/90. Start nasal saline spray 2-3 times a day. Start nasal steroid spray 2 sprays per nostril daily. Follow up in 1 month for CPX with fasting labs prior.

## 2013-05-25 NOTE — Assessment & Plan Note (Signed)
Elevated at home.. Likely better in office due to increase in spirnolactone. Will eval for secondary cause of change.  increase spirnolactone to 50 mg daily. Follow up in 1 month.

## 2013-05-25 NOTE — Assessment & Plan Note (Signed)
May be causing dizziness. Treat with nasal steroid ( will try veramyst as she had some burning with flonase in past.)

## 2013-05-25 NOTE — Progress Notes (Signed)
Pre-visit discussion using our clinic review tool. No additional management support is needed unless otherwise documented below in the visit note.  

## 2013-05-26 ENCOUNTER — Encounter: Payer: Self-pay | Admitting: *Deleted

## 2013-05-26 ENCOUNTER — Telehealth: Payer: Self-pay

## 2013-05-26 LAB — TSH: TSH: 1.76 u[IU]/mL (ref 0.35–5.50)

## 2013-05-26 LAB — CBC WITH DIFFERENTIAL/PLATELET
Basophils Absolute: 0 10*3/uL (ref 0.0–0.1)
Eosinophils Absolute: 0.2 10*3/uL (ref 0.0–0.7)
Eosinophils Relative: 2.4 % (ref 0.0–5.0)
HCT: 40 % (ref 36.0–46.0)
Lymphocytes Relative: 34.6 % (ref 12.0–46.0)
Lymphs Abs: 2.5 10*3/uL (ref 0.7–4.0)
MCV: 85.8 fl (ref 78.0–100.0)
Monocytes Relative: 9.6 % (ref 3.0–12.0)
Platelets: 223 10*3/uL (ref 150.0–400.0)
RBC: 4.66 Mil/uL (ref 3.87–5.11)
RDW: 15.3 % — ABNORMAL HIGH (ref 11.5–14.6)
WBC: 7.4 10*3/uL (ref 4.5–10.5)

## 2013-05-26 LAB — COMPREHENSIVE METABOLIC PANEL
ALT: 26 U/L (ref 0–35)
Alkaline Phosphatase: 54 U/L (ref 39–117)
BUN: 13 mg/dL (ref 6–23)
CO2: 27 mEq/L (ref 19–32)
Creatinine, Ser: 0.8 mg/dL (ref 0.4–1.2)
GFR: 86.95 mL/min (ref >60.00)
Glucose, Bld: 80 mg/dL (ref 70–99)
Sodium: 137 mEq/L (ref 135–145)
Total Bilirubin: 0.4 mg/dL (ref 0.3–1.2)
Total Protein: 7.3 g/dL (ref 6.0–8.3)

## 2013-05-26 MED ORDER — LOSARTAN POTASSIUM 50 MG PO TABS: 50.0000 mg | ORAL_TABLET | Freq: Every day | ORAL | Status: DC

## 2013-05-26 NOTE — Telephone Encounter (Signed)
Given BP elevated on higher dose of spirnolactone... Recommend adding losartan 50 mg daily to regimen.   Will send in rx for her to start.  Have her return for follow up BP check in 10 days.  Call in meantime if BP remains > 140/90.

## 2013-05-26 NOTE — Telephone Encounter (Signed)
Pt said today BP has been elevated all day; BP 160/101 while at [employer]; just rechecked and 154/93. Pt has had h/a earlier today; pt took  gel advils this AM;no h/a now. No h/a,cp,sob,or dizziness. Please advise.[facility]. Pt request cb.

## 2013-05-26 NOTE — Telephone Encounter (Signed)
Patient advised. Appointment scheduled.  

## 2013-06-05 ENCOUNTER — Ambulatory Visit (INDEPENDENT_AMBULATORY_CARE_PROVIDER_SITE_OTHER): Payer: BC Managed Care – PPO | Admitting: Family Medicine

## 2013-06-05 ENCOUNTER — Encounter: Payer: Self-pay | Admitting: Family Medicine

## 2013-06-05 ENCOUNTER — Other Ambulatory Visit: Payer: Self-pay | Admitting: *Deleted

## 2013-06-05 VITALS — BP 120/82 | HR 84 | Temp 98.2°F | Ht 66.0 in | Wt 254.5 lb

## 2013-06-05 DIAGNOSIS — I10 Essential (primary) hypertension: Secondary | ICD-10-CM

## 2013-06-05 LAB — BASIC METABOLIC PANEL
BUN: 16 mg/dL (ref 6–23)
Calcium: 9.2 mg/dL (ref 8.4–10.5)
GFR: 89.65 mL/min (ref >60.00)
Glucose, Bld: 87 mg/dL (ref 70–99)
Potassium: 4.6 mEq/L (ref 3.5–5.1)

## 2013-06-05 NOTE — Assessment & Plan Note (Signed)
Improved control with addition of losartan daily.  Follow BP at home. Encouraged exercise, weight loss, healthy eating habits. Follow up at [facility] in Feb.

## 2013-06-05 NOTE — Patient Instructions (Signed)
Stop at lab on way out for kidney check.  Get a new cuff to check BP... Follow at home. Continue on current regimen.  Keep follow up appt  as scheduled.

## 2013-06-05 NOTE — Progress Notes (Signed)
   Subjective:    Patient ID: Michele Hall. Biber, female    DOB: [birth year], 47 y.o.   MRN: [id number]  HPI  Pt is here for follow up of BP.Marland Kitchen She was continuing to note elevations in BP despite increase in spirnolactone. Over the phone 1 week ago we added losartan 50 mg daily.  Using medication without problems or lightheadedness:  Noted. Chest pain with exertion:None Edema:None Short of breath:None Average home BPs: Her home cuff does not correlate with ours in office.  153/83... Today our BP was 120/82 Other issues:  She has not taken her BP medication yet today. BP Readings from Last 3 Encounters:  06/05/13 143/86  05/25/13 116/70  03/09/13 126/86      Review of Systems  Constitutional: Negative for fever and fatigue.  HENT: Negative for ear pain.   Eyes: Negative for pain.  Respiratory: Negative for chest tightness and shortness of breath.   Cardiovascular: Negative for chest pain, palpitations and leg swelling.  Gastrointestinal: Negative for abdominal pain.  Genitourinary: Negative for dysuria.       Objective:   Physical Exam  Constitutional: Vital signs are normal. She appears well-developed and well-nourished. She is cooperative.  Non-toxic appearance. She does not appear ill. No distress.  obese  HENT:  Head: Normocephalic.  Right Ear: Hearing, tympanic membrane, external ear and ear canal normal. Tympanic membrane is not erythematous, not retracted and not bulging.  Left Ear: Hearing, tympanic membrane, external ear and ear canal normal. Tympanic membrane is not erythematous, not retracted and not bulging.  Nose: No mucosal edema or rhinorrhea. Right sinus exhibits no maxillary sinus tenderness and no frontal sinus tenderness. Left sinus exhibits no maxillary sinus tenderness and no frontal sinus tenderness.  Mouth/Throat: Uvula is midline, oropharynx is clear and moist and mucous membranes are normal.  Eyes: Conjunctivae, EOM and lids are normal. Pupils are equal,  round, and reactive to light. Lids are everted and swept, no foreign bodies found.  Neck: Trachea normal and normal range of motion. Neck supple. Carotid bruit is not present. No mass and no thyromegaly present.  Cardiovascular: Normal rate, regular rhythm, S1 normal, S2 normal, normal heart sounds, intact distal pulses and normal pulses.  Exam reveals no gallop and no friction rub.   No murmur heard. Pulmonary/Chest: Effort normal and breath sounds normal. Not tachypneic. No respiratory distress. She has no decreased breath sounds. She has no wheezes. She has no rhonchi. She has no rales.  Abdominal: Soft. Normal appearance and bowel sounds are normal. There is no tenderness.  Neurological: She is alert.  Skin: Skin is warm, dry and intact. No rash noted.  Psychiatric: Her speech is normal and behavior is normal. Judgment and thought content normal. Her mood appears not anxious. Cognition and memory are normal. She does not exhibit a depressed mood.          Assessment & Plan:

## 2013-06-05 NOTE — Telephone Encounter (Signed)
Recieved prior auth request from pharmacy for .  I obtained authorization  from insurance, and I notified pharmacy of approval via fax. Waiting on approval notice from insurance company to arrive via fax. Once forms are received I will send forms to be signed by provider then scanned into patients medical record.   Approval form received by insurance company. Will send to provider for signature, then to be scanned into patients medical record.

## 2013-06-05 NOTE — Progress Notes (Signed)
Pre-visit discussion using our clinic review tool. No additional management support is needed unless otherwise documented below in the visit note.  

## 2013-06-08 DIAGNOSIS — K602 Anal fissure, unspecified: Secondary | ICD-10-CM

## 2013-06-08 HISTORY — DX: Anal fissure, unspecified: K60.2

## 2013-06-16 ENCOUNTER — Telehealth: Payer: Self-pay

## 2013-06-16 MED ORDER — FLUTICASONE PROPIONATE 50 MCG/ACT NA SUSP: 2.0000 | Freq: Every day | NASAL | Status: DC

## 2013-06-16 NOTE — Telephone Encounter (Signed)
Okay to change to generic flonase

## 2013-06-16 NOTE — Telephone Encounter (Signed)
Michele Hall notified prescription for flonase has been sent to her pharmacy.

## 2013-06-16 NOTE — Telephone Encounter (Signed)
Pt left v/m requesting a substitute med for fluticasone nasal spray(Veramyst); even with insurance helping to pay for nasal spray still too expensive. Pt wants to know if could be changed to flonase? [facility].Please advise. Pt request cb.

## 2013-06-29 ENCOUNTER — Other Ambulatory Visit: Payer: Self-pay | Admitting: Obstetrics & Gynecology

## 2013-06-29 DIAGNOSIS — Z1231 Encounter for screening mammogram for malignant neoplasm of breast: Secondary | ICD-10-CM

## 2013-07-03 ENCOUNTER — Other Ambulatory Visit (INDEPENDENT_AMBULATORY_CARE_PROVIDER_SITE_OTHER): Payer: BC Managed Care – PPO

## 2013-07-03 ENCOUNTER — Telehealth: Payer: Self-pay

## 2013-07-03 ENCOUNTER — Telehealth: Payer: Self-pay | Admitting: Family Medicine

## 2013-07-03 DIAGNOSIS — E785 Hyperlipidemia, unspecified: Secondary | ICD-10-CM

## 2013-07-03 DIAGNOSIS — E538 Deficiency of other specified B group vitamins: Secondary | ICD-10-CM

## 2013-07-03 LAB — LIPID PANEL
CHOLESTEROL: 157 mg/dL (ref 0–200)
HDL: 38 mg/dL — ABNORMAL LOW (ref >39.00)
TRIGLYCERIDES: 263 mg/dL — AB (ref 0.0–149.0)
Total CHOL/HDL Ratio: 4
VLDL: 52.6 mg/dL — ABNORMAL HIGH (ref 0.0–40.0)

## 2013-07-03 LAB — LDL CHOLESTEROL, DIRECT: Direct LDL: 84.1 mg/dL

## 2013-07-03 LAB — VITAMIN B12: Vitamin B-12: 243 pg/mL (ref 211–911)

## 2013-07-03 NOTE — Telephone Encounter (Signed)
Message copied by Jinny Sanders on Mon Jul 03, 2013  8:09 AM ------      Message from: Ellamae Sia      Created: Mon Jun 26, 2013 12:00 PM      Regarding: Lab orders for Monday,1.26.15       Patient is scheduled for CPX labs, please order future labs, Thanks , Terri       ------

## 2013-07-03 NOTE — Telephone Encounter (Signed)
Pt left v/m checking on status of omeprazole refill; pt should have refills; spoke with pharmacist and prior auth needed. [facility]. Pt advised the PA process and pt will purchase OTC omeprazole until PA completed.

## 2013-07-10 ENCOUNTER — Encounter: Payer: BC Managed Care – PPO | Admitting: Family Medicine

## 2013-07-11 ENCOUNTER — Ambulatory Visit (INDEPENDENT_AMBULATORY_CARE_PROVIDER_SITE_OTHER): Payer: BC Managed Care – PPO | Admitting: Family Medicine

## 2013-07-11 ENCOUNTER — Encounter: Payer: Self-pay | Admitting: Family Medicine

## 2013-07-11 VITALS — BP 100/60 | HR 91 | Temp 98.3°F | Ht 66.0 in | Wt 258.5 lb

## 2013-07-11 DIAGNOSIS — K589 Irritable bowel syndrome without diarrhea: Secondary | ICD-10-CM

## 2013-07-11 DIAGNOSIS — R141 Gas pain: Secondary | ICD-10-CM

## 2013-07-11 DIAGNOSIS — R140 Abdominal distension (gaseous): Secondary | ICD-10-CM | POA: Insufficient documentation

## 2013-07-11 DIAGNOSIS — R142 Eructation: Secondary | ICD-10-CM

## 2013-07-11 DIAGNOSIS — R143 Flatulence: Secondary | ICD-10-CM

## 2013-07-11 NOTE — Progress Notes (Signed)
Pre-visit discussion using our clinic review tool. No additional management support is needed unless otherwise documented below in the visit note.  

## 2013-07-11 NOTE — Progress Notes (Signed)
   Subjective:    Patient ID: Michele Hall, female    DOB: [birth year], 48 y.o.   MRN: [id number]  HPI  48 year old female  with IBS, HTN presents with abdominal bloating off and on for several month, worse in last few weeks. She has a lot of gas, burping, flatuance. She notes pain above umbilicus. She feels well until she eats , then symptoms come 30 min after eating, then lasts few hours to all day. Does not matter what she eats. She does eat fat and fast food. She stopped omeprazole in last week. She has stopped caffeine. Occ having hearburn after meals... Using tums which helps with this.   No blood in stool.  BMs occuring daily to every other day. Nml for her, usually soft, occ straining.   She had antibiotics last in the fall.  Colonoscopy: never had.   Review of Systems  Constitutional: Negative for fever and fatigue.  HENT: Negative for ear pain.   Eyes: Negative for pain.  Respiratory: Negative for chest tightness and shortness of breath.   Cardiovascular: Negative for chest pain, palpitations and leg swelling.  Gastrointestinal: Positive for abdominal pain and abdominal distention.  Genitourinary: Negative for dysuria.       Objective:   Physical Exam  Constitutional: Vital signs are normal. She appears well-developed and well-nourished. She is cooperative.  Non-toxic appearance. She does not appear ill. No distress.  Obese   HENT:  Head: Normocephalic.  Right Ear: Hearing, tympanic membrane, external ear and ear canal normal. Tympanic membrane is not erythematous, not retracted and not bulging.  Left Ear: Hearing, tympanic membrane, external ear and ear canal normal. Tympanic membrane is not erythematous, not retracted and not bulging.  Nose: No mucosal edema or rhinorrhea. Right sinus exhibits no maxillary sinus tenderness and no frontal sinus tenderness. Left sinus exhibits no maxillary sinus tenderness and no frontal sinus tenderness.  Mouth/Throat: Uvula is  midline, oropharynx is clear and moist and mucous membranes are normal.  Eyes: Conjunctivae, EOM and lids are normal. Pupils are equal, round, and reactive to light. Lids are everted and swept, no foreign bodies found.  Neck: Trachea normal and normal range of motion. Neck supple. Carotid bruit is not present. No mass and no thyromegaly present.  Cardiovascular: Normal rate, regular rhythm, S1 normal, S2 normal, normal heart sounds, intact distal pulses and normal pulses.  Exam reveals no gallop and no friction rub.   No murmur heard. Pulmonary/Chest: Effort normal and breath sounds normal. Not tachypneic. No respiratory distress. She has no decreased breath sounds. She has no wheezes. She has no rhonchi. She has no rales.  Abdominal: Soft. Normal appearance and bowel sounds are normal. There is no tenderness.  Neurological: She is alert.  Skin: Skin is warm, dry and intact. No rash noted.  Psychiatric: Her speech is normal and behavior is normal. Judgment and thought content normal. Her mood appears not anxious. Cognition and memory are normal. She does not exhibit a depressed mood.          Assessment & Plan:

## 2013-07-11 NOTE — Patient Instructions (Signed)
Start OTC lactobacilli like Align daily. Decrease greasy, fried food, fast food and eating... Increase fiber in diet  as well as water. Make sure to increase it slowly. Stay off omeprazole. Restart B12 1000 mcg daily. Follow up in 2 weeks.

## 2013-07-11 NOTE — Assessment & Plan Note (Signed)
Start lactobacilli for regularity.  Improve diet. Decrease fat.  Likely IBS is contributer/etiology. Increase fiber, exercise ans water.  Follow up in 2 weeks.

## 2013-07-25 ENCOUNTER — Ambulatory Visit: Payer: BC Managed Care – PPO | Admitting: Family Medicine

## 2013-07-27 ENCOUNTER — Ambulatory Visit (INDEPENDENT_AMBULATORY_CARE_PROVIDER_SITE_OTHER): Payer: BC Managed Care – PPO | Admitting: Internal Medicine

## 2013-07-27 ENCOUNTER — Encounter: Payer: Self-pay | Admitting: Internal Medicine

## 2013-07-27 ENCOUNTER — Telehealth: Payer: Self-pay

## 2013-07-27 VITALS — BP 108/70 | HR 96 | Ht 67.0 in | Wt 259.0 lb

## 2013-07-27 DIAGNOSIS — K219 Gastro-esophageal reflux disease without esophagitis: Secondary | ICD-10-CM

## 2013-07-27 DIAGNOSIS — K602 Anal fissure, unspecified: Secondary | ICD-10-CM

## 2013-07-27 DIAGNOSIS — R198 Other specified symptoms and signs involving the digestive system and abdomen: Secondary | ICD-10-CM

## 2013-07-27 DIAGNOSIS — R194 Change in bowel habit: Secondary | ICD-10-CM

## 2013-07-27 DIAGNOSIS — K625 Hemorrhage of anus and rectum: Secondary | ICD-10-CM

## 2013-07-27 MED ORDER — PANTOPRAZOLE SODIUM 40 MG PO TBEC: 40.0000 mg | DELAYED_RELEASE_TABLET | Freq: Every day | ORAL | Status: DC

## 2013-07-27 MED ORDER — AMBULATORY NON FORMULARY MEDICATION: Status: DC

## 2013-07-27 MED ORDER — NA SULFATE-K SULFATE-MG SULF 17.5-3.13-1.6 GM/177ML PO SOLN: ORAL | Status: DC

## 2013-07-27 NOTE — Telephone Encounter (Signed)
[facility] [phone] to get the pantoprazole approved that was rx'ed today at her office visit.  She has tried omeprazole 40mg  .  Dx -  GERD.  Approval # [id number], good from 07/06/13 thru 07/27/2014. [facility] notified of approval, their # [phone].

## 2013-07-27 NOTE — Progress Notes (Signed)
Subjective:   Referred by Dr. Diona Browner   Patient ID: Michele Hall, female    DOB: [birth year], 48 y.o.   MRN: [id number]  HPI The patient is a very nice middle-aged woman with a several month history of post-prandial bloating and irregular bowel habits. There is also frequent heartburn. She was on omeprazole which was not helping any of this so stopped it. Heartburn and nocturnal regurgitation worsened. She was given dietary advice for GERD and IBS and advised to take Align but that has not alleviated her symptoms. She has had painful defecation with rectal bleeding (bright red) for several weeks. Two boxes of OTC hemorrhoid suppositories and stool softeners have not helped. She is not drinking caffeine except rarely and does not use tobacco. No particular food triggers described.  Allergies  Allergen Reactions  . Requip [Ropinirole Hcl] Other (See Comments)    Headache Feels "sick"   Outpatient Prescriptions Prior to Visit  Medication Sig Dispense Refill  . fluticasone (FLONASE) 50 MCG/ACT nasal spray Place 2 sprays into both nostrils daily as needed.      Marland Kitchen losartan (COZAAR) 50 MG tablet Take 1 tablet (50 mg total) by mouth daily.  30 tablet  11  . spironolactone (ALDACTONE) 50 MG tablet TAKE 1 TABLET (50 MG TOTAL) BY MOUTH DAILY.  30 tablet  5  . omeprazole (PRILOSEC) 40 MG capsule TAKE 1 CAPSULE (40 MG TOTAL) BY MOUTH DAILY.  30 capsule  5   No facility-administered medications prior to visit.   Past Medical History  Diagnosis Date  . Hypertension   . Anemia   . GERD (gastroesophageal reflux disease)   . Morbid obesity   . Kidney stones   . Gallstones    Past Surgical History  Procedure Laterality Date  . Cholecystectomy    . Tubal ligation    . Dilitation & currettage/hystroscopy with hydrothermal ablation N/A 09/13/2012    Procedure: DILATATION & CURETTAGE/HYSTEROSCOPY WITH HYDROTHERMAL ABLATION;  Surgeon: Osborne Oman, MD;  Location: [facility];  Service:  Gynecology;  Laterality: N/A;   History   Social History  . Marital Status: Married    Spouse Name: N/A    Number of Children: 2  . Years of Education: N/A   Occupational History  . house cleaner/ former bus driver    Social History Main Topics  . Smoking status: Former Smoker    Quit date: 02/09/2002  . Smokeless tobacco: Never Used  . Alcohol Use: No  . Drug Use: No  . Sexual Activity: Yes    Partners: Male    Birth Control/ Protection: Surgical   Other Topics Concern  . None   Social History Narrative   Married, 2 sons   Essentially no caffeine         Family History  Problem Relation Age of Onset  . Stroke Mother   . Kidney disease Father   . Coronary artery disease Father   . Aneurysm Father     AAA  . Hypertension Father   . Seizures Sister   . Heart attack Maternal Grandfather   . Cancer Sister     ovarian/uterus  . Tongue cancer Sister     Review of Systems + night sweats, allergies and sinus troubles.    Objective:   Physical Exam General:  Well-developed, well-nourished and in no acute distress - obese Eyes:  anicteric. ENT:   Mouth and posterior pharynx free of lesions.  Neck:   supple w/o thyromegaly or mass.  Lungs: Clear to auscultation bilaterally. Heart:  S1S2, no rubs, murmurs, gallops. Abdomen:  soft, non-tender, no hepatosplenomegaly, hernia, or mass and BS+. + striae Rectal:  Female staff present   Anoderm inspection revealed no abnormalities Anal wink was absent Digital exam revealed a posterior midline fissure that was tender and did not allow DRE    Lymph:  no cervical or supraclavicular adenopathy. Extremities:   no edema Skin   no rash. Neuro:  A&O x 3.  Psych:  appropriate mood and  Affect.   Data Reviewed: PCP notes     Assessment & Plan:  Rectal bleeding -  Change in bowel habits -  Esophageal reflux -  Anal fissure-   1. 0.125% NTG ointment rectal bid-tid to treat fissure 2. Colonoscopy to investigate  bowel changes and bleeding further The risks and benefits as well as alternatives of endoscopic procedure(s) have been discussed and reviewed. All questions answered. The patient agrees to proceed. 3. EGD to investigate heartburn problems. The risks and benefits as well as alternatives of endoscopic procedure(s) have been discussed and reviewed. All questions answered. The patient agrees to proceed. 4. Benefiber then MiraLax if needed 5. pantoprazole 40 mg daily since omeprazole failed 6. Anal fissure handout  I appreciate the opportunity to care for this patient. Cc:Amy Diona Browner, MD

## 2013-07-27 NOTE — Patient Instructions (Addendum)
You have been scheduled for an endoscopy and colonoscopy with propofol. Please follow the written instructions given to you at your visit today. Please pick up your prep at the pharmacy within the next 1-3 days. If you use inhalers (even only as needed), please bring them with you on the day of your procedure.  We have sent the following medications to your pharmacy for you to pick up at your convenience: Pantoprazole  We have faxed an rx to [facility]  For you to pick up for nitroglycerin oint.  We have given you the original rx.  We are providing you with a Benefiber handout to read and follow, work up to 2-3 Tablespoons a day.  If this doesn't provide results then add Miralax daily.    We are giving you a handout to read on Anal fissure.    I appreciate the opportunity to care for you.

## 2013-07-28 ENCOUNTER — Ambulatory Visit: Payer: BC Managed Care – PPO | Admitting: Family Medicine

## 2013-07-31 ENCOUNTER — Telehealth: Payer: Self-pay | Admitting: Internal Medicine

## 2013-07-31 NOTE — Telephone Encounter (Signed)
Patient having an increase in rectal bleeding.  She is now having bleeding when she passes gas.  Pain "is excruciating" when having a BM, "I feel like I have to go but I can't"   She is advised to try Recticare for the discomfort.  She is very concerned about the bleeding.  I have tried to reassure her that she may see some bleeding.  She would like you to comment on bleeding.  Please advise

## 2013-07-31 NOTE — Telephone Encounter (Signed)
Patient notified She is scheduled for 08/07/13 3:15

## 2013-07-31 NOTE — Telephone Encounter (Signed)
Have her apply the NTG qid also Sitz baths 2+ times a day She can f/u me next week for a recheck

## 2013-08-04 ENCOUNTER — Ambulatory Visit (HOSPITAL_COMMUNITY)
Admission: RE | Admit: 2013-08-04 | Discharge: 2013-08-04 | Disposition: A | Discharge status: Home / Self Care | Payer: BC Managed Care – PPO | Source: Ambulatory Visit | Attending: Obstetrics & Gynecology | Admitting: Obstetrics & Gynecology

## 2013-08-04 DIAGNOSIS — Z1231 Encounter for screening mammogram for malignant neoplasm of breast: Secondary | ICD-10-CM | POA: Insufficient documentation

## 2013-08-04 IMAGING — MG MM DIGITAL SCREENING BILAT
8 series · 8 of 8 positions shown · non-contrast
Comparison: Previous exam(s)

ACR Breast Density Category a: The breast tissue is almost entirely
fatty.

CLINICAL DATA: Screening.

EXAM:
DIGITAL SCREENING BILATERAL MAMMOGRAM WITH CAD

[L MLO (1 of 3)]
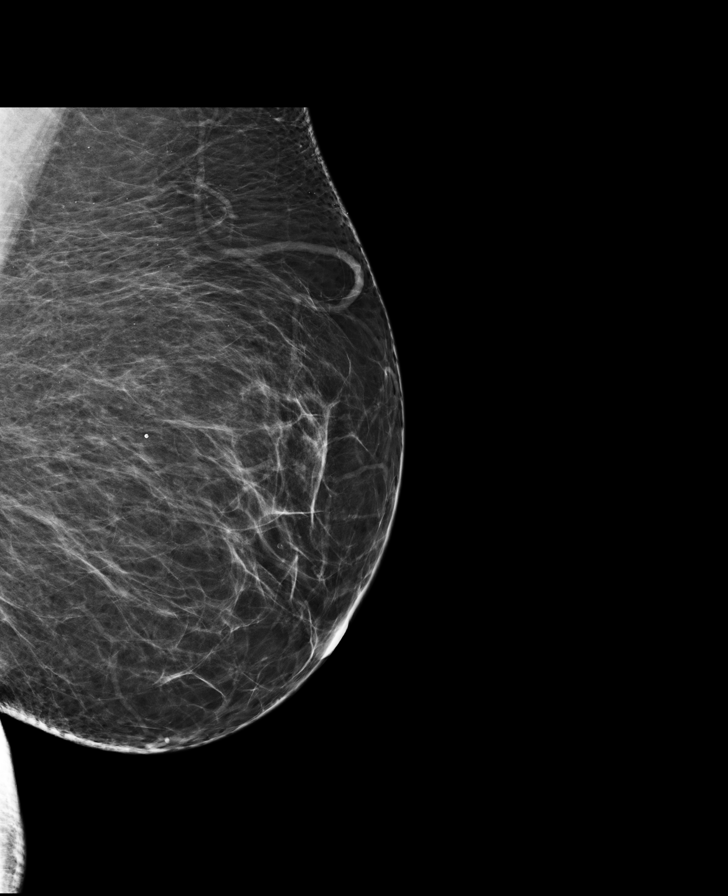

[R CC]
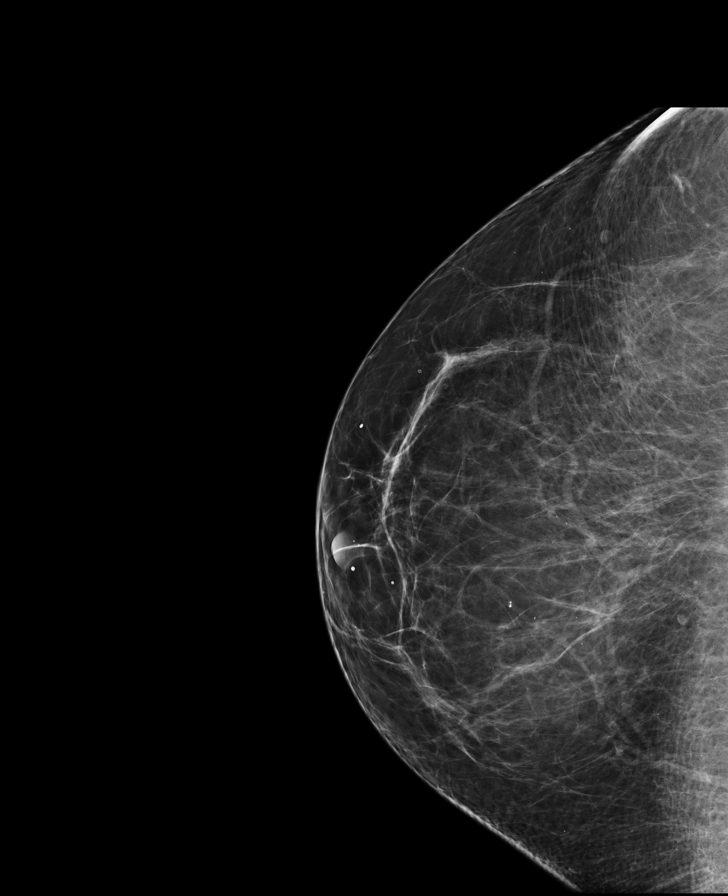

[L MLO (2 of 3)]
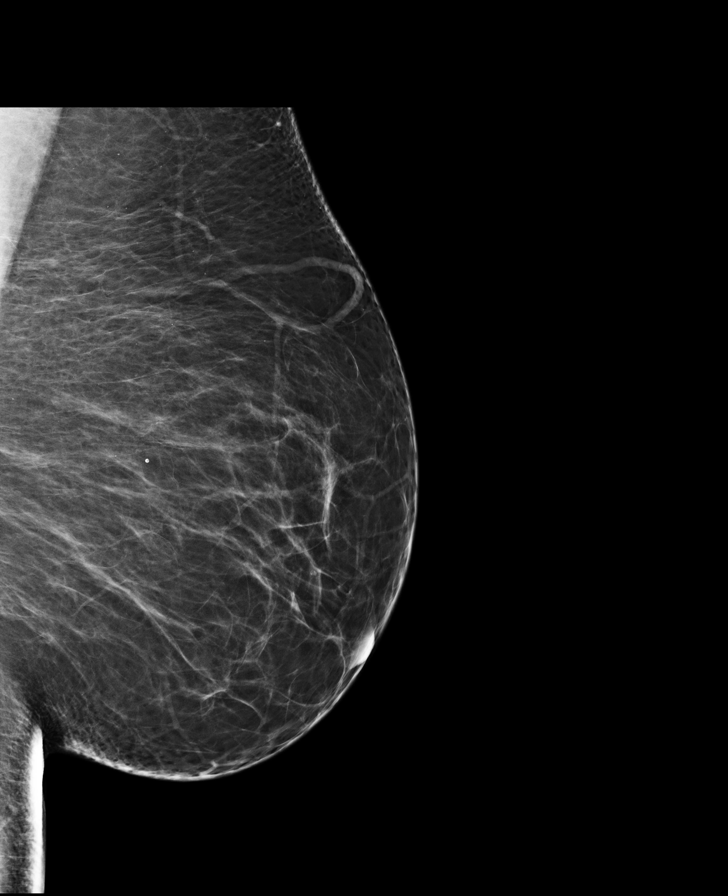

[R MLO (1 of 2)]
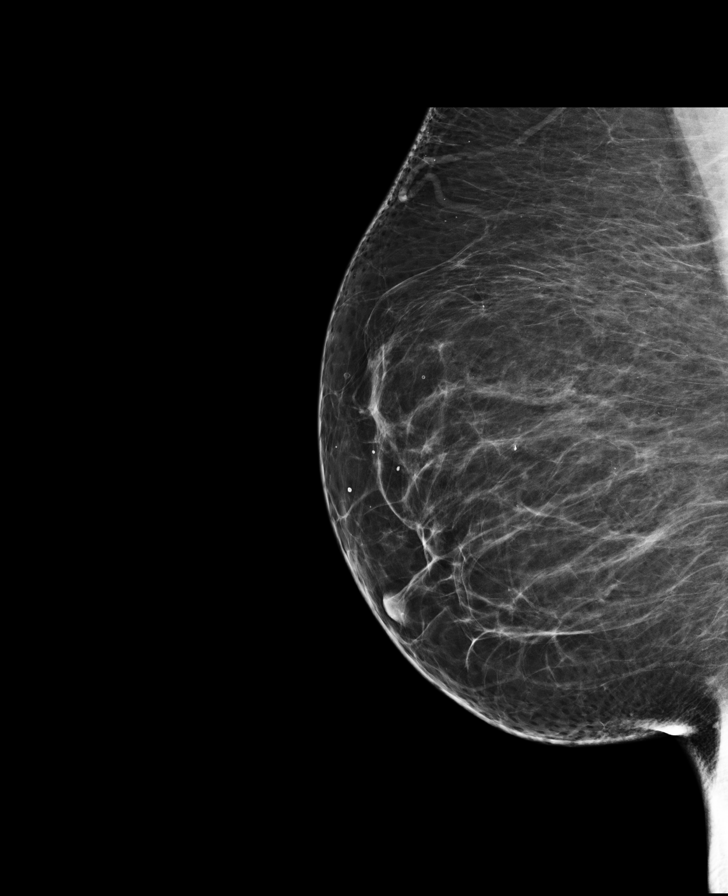

[L MLO (3 of 3)]
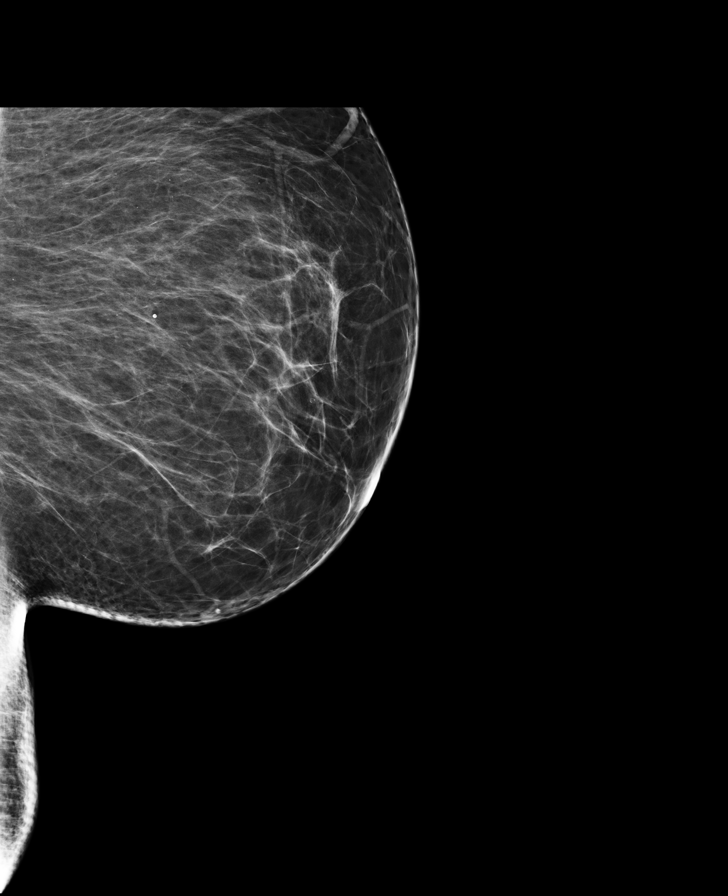

[L CV]
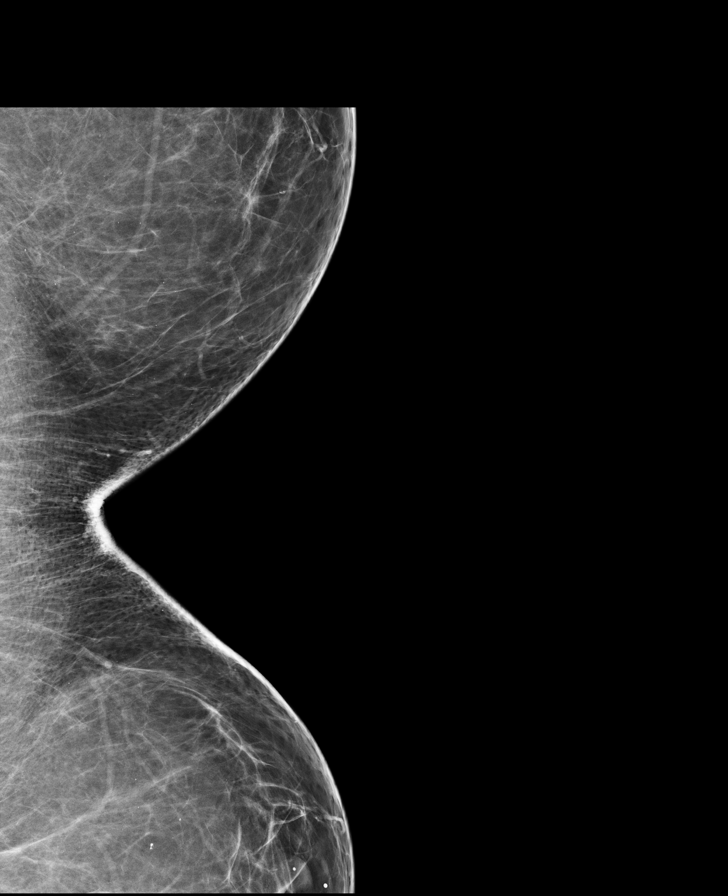

[R MLO (2 of 2)]
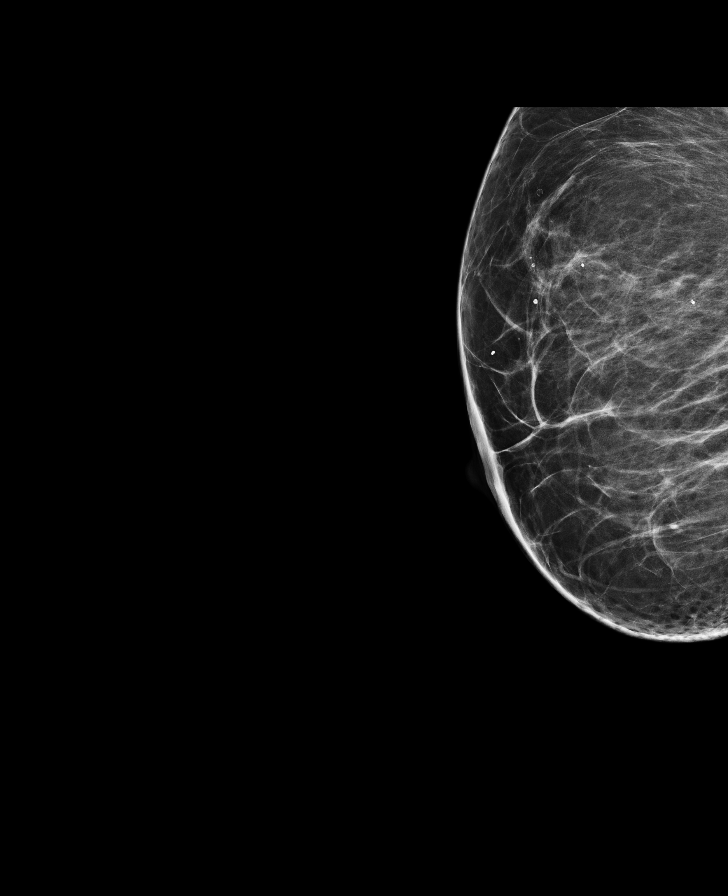

[L CC]
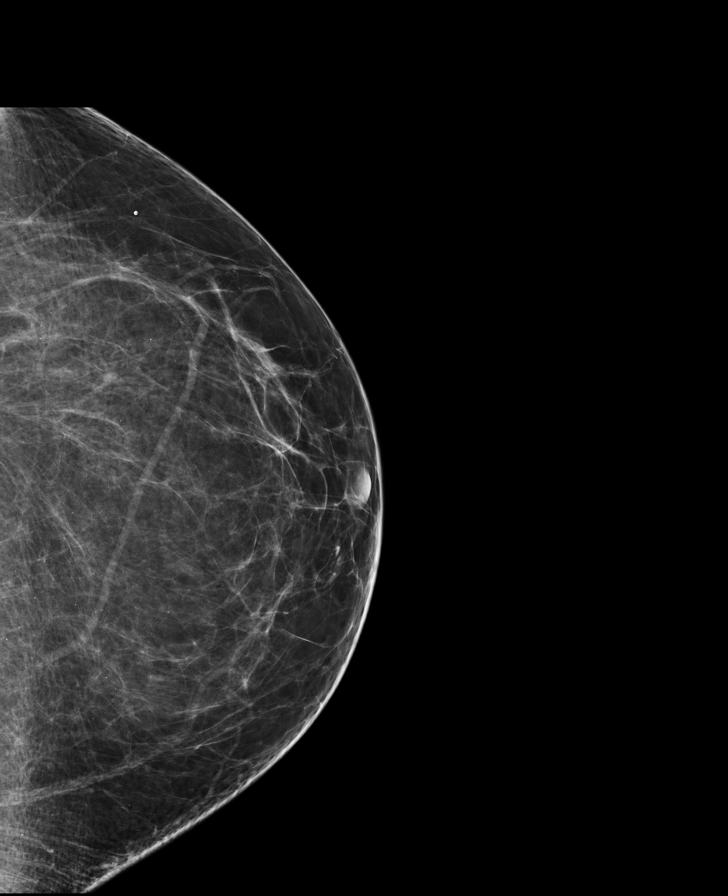

[8 of 8 positions shown; findings below may reference images not displayed]

FINDINGS: There are no findings suspicious for malignancy. Images were
processed with CAD.
IMPRESSION: No mammographic evidence of malignancy. A result letter of this
screening mammogram will be mailed directly to the patient.

RECOMMENDATION:
Screening mammogram in one year. (Code:[0Q])

BI-RADS CATEGORY  1: Negative.

## 2013-08-07 ENCOUNTER — Ambulatory Visit: Payer: BC Managed Care – PPO | Admitting: Internal Medicine

## 2013-08-14 ENCOUNTER — Telehealth: Payer: Self-pay

## 2013-08-14 NOTE — Telephone Encounter (Signed)
The prior authorization done in Feb. On the pantoprazole not going thru.  Spoke to Teama at # [phone] who located the issue and corrected this so that it went thru, [facility] notified.

## 2013-08-31 ENCOUNTER — Ambulatory Visit: Payer: BC Managed Care – PPO | Admitting: Family Medicine

## 2013-09-01 ENCOUNTER — Ambulatory Visit: Payer: BC Managed Care – PPO | Admitting: Family Medicine

## 2013-09-01 DIAGNOSIS — Z0289 Encounter for other administrative examinations: Secondary | ICD-10-CM

## 2013-09-06 ENCOUNTER — Encounter: Payer: Self-pay | Admitting: Internal Medicine

## 2013-09-06 ENCOUNTER — Ambulatory Visit (AMBULATORY_SURGERY_CENTER): Payer: BC Managed Care – PPO | Admitting: Internal Medicine

## 2013-09-06 VITALS — BP 109/64 | HR 77 | Temp 98.6°F | Resp 9 | Ht 67.0 in | Wt 259.0 lb

## 2013-09-06 DIAGNOSIS — K219 Gastro-esophageal reflux disease without esophagitis: Secondary | ICD-10-CM

## 2013-09-06 DIAGNOSIS — K625 Hemorrhage of anus and rectum: Secondary | ICD-10-CM

## 2013-09-06 DIAGNOSIS — K602 Anal fissure, unspecified: Secondary | ICD-10-CM | POA: Insufficient documentation

## 2013-09-06 MED ORDER — SODIUM CHLORIDE 0.9 % IV SOLN: 500.0000 mL | INTRAVENOUS | Status: DC

## 2013-09-06 NOTE — Op Note (Signed)
[place]  [address]. [place], [address]   ENDOSCOPY PROCEDURE REPORT  PATIENT: Niki, Payment  MR#: [id number] BIRTHDATE: [birth year] , 47  yrs. old GENDER: Female ENDOSCOPIST: Gatha Mayer, MD, [facility] REFERRED BY:  Jinny Sanders, M.D. PROCEDURE DATE:  09/06/2013 PROCEDURE:  EGD, diagnostic ASA CLASS:     Class III INDICATIONS:  Heartburn. despite PPI MEDICATIONS: propofol (Diprivan) 200mg  IV, MAC sedation, administered by CRNA, and These medications were titrated to patient response per physician's verbal order TOPICAL ANESTHETIC: Cetacaine Spray  DESCRIPTION OF PROCEDURE: After the risks benefits and alternatives of the procedure were thoroughly explained, informed consent was obtained.  The LB [id number] endoscope was introduced through the mouth and advanced to the second portion of the duodenum. Without limitations.  The instrument was slowly withdrawn as the mucosa was fully examined.      The upper, middle and distal third of the esophagus were carefully inspected and no abnormalities were noted.  The z-line was well seen at the GEJ.  The endoscope was pushed into the fundus which was normal including a retroflexed view.  The antrum, gastric body, first and second part of the duodenum were unremarkable. Retroflexed views revealed no abnormalities.     The scope was then withdrawn from the patient and the procedure completed.  COMPLICATIONS: There were no complications. ENDOSCOPIC IMPRESSION: Normal EGD  RECOMMENDATIONS: 1.  Continue PPI - if still not controlling symptoms in a few months return visit 2.  Proceed with a Colonoscopy. 3.   Antacids prn   eSigned:  Gatha Mayer, MD, [facility] 09/06/2013 1:52 PM   CC:Amy Cletis Athens, MD and The Patient

## 2013-09-06 NOTE — Op Note (Signed)
[place]  [address]. [place], [address]   COLONOSCOPY PROCEDURE REPORT  PATIENT: Michele Hall, Michele Hall  MR#: [id number] BIRTHDATE: [birth year] , 47  yrs. old GENDER: Female ENDOSCOPIST: Gatha Mayer, MD, [facility] REFERRED BY:Amy Cletis Athens, M.D. PROCEDURE DATE:  09/06/2013 PROCEDURE:   Colonoscopy, diagnostic First Screening Colonoscopy - Avg.  risk and is 50 yrs.  old or older - No.  Prior Negative Screening - Now for repeat screening. N/A  History of Adenoma - Now for follow-up colonoscopy & has been > or = to 3 yrs.  N/A  Polyps Removed Today? No.  Recommend repeat exam, <10 yrs? No. ASA CLASS:   Class III INDICATIONS:Rectal Bleeding. MEDICATIONS: propofol (Diprivan) 250mg  IV, MAC sedation, administered by CRNA, and These medications were titrated to patient response per physician's verbal order  DESCRIPTION OF PROCEDURE:   After the risks benefits and alternatives of the procedure were thoroughly explained, informed consent was obtained.  A digital rectal exam revealed no abnormalities of the rectum.   The LB [id number]  endoscope was introduced through the anus and advanced to the cecum, which was identified by both the appendix and ileocecal valve. No adverse events experienced.   The quality of the prep was excellent using Suprep  The instrument was then slowly withdrawn as the colon was fully examined.  COLON FINDINGS: Small-medium posterior anal fissure was found in the anal canal.   The colon mucosa was otherwise normal.   A right colon retroflexion was performed.  Retroflexed views revealed no abnormalities. The time to cecum=2 minutes 00 seconds.  Withdrawal time=9 minutes 10 seconds.  The scope was withdrawn and the procedure completed. COMPLICATIONS: There were no complications.  ENDOSCOPIC IMPRESSION: 1.   Small -mediunm posterior anal fissure in the anal canal 2.   The colon mucosa was otherwise normal - excellent  prep  RECOMMENDATIONS: Refill and use NTG ointment for 2 momths straight to heal fissure Stay on Benefiber If this fails to heal fissure return vs.  see surgery Repeat colonoscopy 2025   eSigned:  Gatha Mayer, MD, [facility] 09/06/2013 1:57 PM   cc: The Patient and Jinny Sanders, MD

## 2013-09-06 NOTE — Progress Notes (Signed)
Patient denies any allergies to eggs or soy. Patient denies any problems with anesthesia.  

## 2013-09-06 NOTE — Progress Notes (Signed)
Lidocaine-40mg IV prior to Propofol InductionPropofol given over incremental dosages 

## 2013-09-06 NOTE — Patient Instructions (Addendum)
The upper endoscopy was ok. No signs of reflux damage. Stay on the pantoprazole. OK to use Gaviscon or other antacids as needed.  The colon was ok - the fissure is better but not gone.  Please continue to use Benefiber and refill the nitroglycerin ointment and use that another 2 months to completely heal the fissure.  If it fails to heal up then return to see me.  Next routine colonoscopy in 10 years - 2025    YOU HAD AN ENDOSCOPIC PROCEDURE TODAY AT [facility]: Refer to the procedure report that was given to you for any specific questions about what was found during the examination.  If the procedure report does not answer your questions, please call your gastroenterologist to clarify.  If you requested that your care partner not be given the details of your procedure findings, then the procedure report has been included in a sealed envelope for you to review at your convenience later.  YOU SHOULD EXPECT: Some feelings of bloating in the abdomen. Passage of more gas than usual.  Walking can help get rid of the air that was put into your GI tract during the procedure and reduce the bloating. If you had a lower endoscopy (such as a colonoscopy or flexible sigmoidoscopy) you may notice spotting of blood in your stool or on the toilet paper. If you underwent a bowel prep for your procedure, then you may not have a normal bowel movement for a few days.  DIET: Your first meal following the procedure should be a light meal and then it is ok to progress to your normal diet.  A half-sandwich or bowl of soup is an example of a good first meal.  Heavy or fried foods are harder to digest and may make you feel nauseous or bloated.  Likewise meals heavy in dairy and vegetables can cause extra gas to form and this can also increase the bloating.  Drink plenty of fluids but you should avoid alcoholic beverages for 24 hours.  ACTIVITY: Your care partner should take you home directly after the  procedure.  You should plan to take it easy, moving slowly for the rest of the day.  You can resume normal activity the day after the procedure however you should NOT DRIVE or use heavy machinery for 24 hours (because of the sedation medicines used during the test).    SYMPTOMS TO REPORT IMMEDIATELY: A gastroenterologist can be reached at any hour.  During normal business hours, 8:30 AM to 5:00 PM Monday through Friday, call [phone].  After hours and on weekends, please call the GI answering service at [phone] who will take a message and have the physician on call contact you.   Following lower endoscopy (colonoscopy or flexible sigmoidoscopy):  Excessive amounts of blood in the stool  Significant tenderness or worsening of abdominal pains  Swelling of the abdomen that is new, acute  Fever of 100F or higher  Following upper endoscopy (EGD)  Vomiting of blood or coffee ground material  New chest pain or pain under the shoulder blades  Painful or persistently difficult swallowing  New shortness of breath  Fever of 100F or higher  Black, tarry-looking stools  FOLLOW UP: If any biopsies were taken you will be contacted by phone or by letter within the next 1-3 weeks.  Call your gastroenterologist if you have not heard about the biopsies in 3 weeks.  Our staff will call the home number listed on your records  the next business day following your procedure to check on you and address any questions or concerns that you may have at that time regarding the information given to you following your procedure. This is a courtesy call and so if there is no answer at the home number and we have not heard from you through the emergency physician on call, we will assume that you have returned to your regular daily activities without incident.  SIGNATURES/CONFIDENTIALITY: You and/or your care partner have signed paperwork which will be entered into your electronic medical record.  These  signatures attest to the fact that that the information above on your After Visit Summary has been reviewed and is understood.  Full responsibility of the confidentiality of this discharge information lies with you and/or your care-partner

## 2013-09-07 ENCOUNTER — Telehealth: Payer: Self-pay | Admitting: *Deleted

## 2013-09-07 NOTE — Telephone Encounter (Signed)
  Follow up Call-  Call back number 09/06/2013  Post procedure Call Back phone  # [phone]  Permission to leave phone message Yes     Patient questions:  Do you have a fever, pain , or abdominal swelling? no Pain Score  0 *  Have you tolerated food without any problems? yes  Have you been able to return to your normal activities? yes  Do you have any questions about your discharge instructions: Diet   no Medications  no Follow up visit  no  Do you have questions or concerns about your Care? no  Actions: * If pain score is 4 or above: No action needed, pain <4.pt did ask could she use Neosporin instead of NTG ointment for fissure .Spoke with Dr. Carlean Purl and told pt  to use only the NTG ointment as Dr. Carlean Purl ordered .

## 2013-09-22 ENCOUNTER — Encounter: Payer: BC Managed Care – PPO | Admitting: Family Medicine

## 2013-09-22 DIAGNOSIS — Z0289 Encounter for other administrative examinations: Secondary | ICD-10-CM

## 2013-11-13 ENCOUNTER — Other Ambulatory Visit: Payer: Self-pay | Admitting: Family Medicine

## 2014-01-10 ENCOUNTER — Encounter: Payer: Self-pay | Admitting: Family Medicine

## 2014-01-10 ENCOUNTER — Ambulatory Visit (INDEPENDENT_AMBULATORY_CARE_PROVIDER_SITE_OTHER): Payer: BC Managed Care – PPO | Admitting: Family Medicine

## 2014-01-10 VITALS — BP 90/60 | HR 95 | Temp 98.1°F | Ht 67.0 in | Wt 261.0 lb

## 2014-01-10 DIAGNOSIS — T148 Other injury of unspecified body region: Secondary | ICD-10-CM

## 2014-01-10 DIAGNOSIS — W57XXXA Bitten or stung by nonvenomous insect and other nonvenomous arthropods, initial encounter: Secondary | ICD-10-CM

## 2014-01-10 MED ORDER — SULFAMETHOXAZOLE-TMP DS 800-160 MG PO TABS: 1.0000 | ORAL_TABLET | Freq: Two times a day (BID) | ORAL | Status: DC

## 2014-01-10 MED ORDER — MOMETASONE FUROATE 0.1 % EX CREA: 1.0000 "application " | TOPICAL_CREAM | Freq: Every day | CUTANEOUS | Status: DC

## 2014-01-10 NOTE — Progress Notes (Signed)
Subjective:    Patient ID: Michele Hall, female    DOB: 1966-04-10, 48 y.o.   MRN: [id number]  HPI Here for an insect bite that occurred on Sat  She felt something when putting on a skirt- but never looked at it  The next day she had a red whelp on her R leg (posterior) that was hot to the touch  Itchy also  Can see bite marks - with a lot of redness around it  No bruising   Now the redness is a smaller area - but there is a streak from it   Did not pull a tick off of it   No fever  Stomach has been upset  Other than that she feels ok   She used rubbing alcohol to reduce itch and cool it down  Put peroxide on it today  Then cortisone 10 otc -earlier today   Patient Active Problem List   Diagnosis Date Noted  . Anal fissure - posterior 09/06/2013  . Abdominal bloating 07/11/2013  . Eustachian tube dysfunction 05/25/2013  . Adenomyosis 08/03/2012  . Fibroids 08/03/2012  . ASCUS pap smear with negative high rish risk HPV 07/08/2012  . History of nephrolithiasis 10/27/2010  . VITAMIN B12 DEFICIENCY 07/18/2010  . ANEMIA, IRON DEFICIENCY 07/18/2010  . RESTLESS LEG SYNDROME 05/07/2010  . Abnormal uterine bleeding 10/29/2006  . HYPERLIPIDEMIA 10/27/2006  . OBESITY 10/27/2006  . HYPERTENSION 10/27/2006  . GERD 10/27/2006  . IBS 10/27/2006   Past Medical History  Diagnosis Date  . Hypertension   . Anemia   . GERD (gastroesophageal reflux disease)   . Morbid obesity   . Kidney stones   . Gallstones   . Anal fissure - posterior 2015  . IBS    Past Surgical History  Procedure Laterality Date  . Cholecystectomy    . Tubal ligation    . Dilitation & currettage/hystroscopy with hydrothermal ablation N/A 09/13/2012    Procedure: DILATATION & CURETTAGE/HYSTEROSCOPY WITH HYDROTHERMAL ABLATION;  Surgeon: Osborne Oman, MD;  Location: [facility];  Service: Gynecology;  Laterality: N/A;   History  Substance Use Topics  . Smoking status: Former Smoker    Quit date:  02/09/2002  . Smokeless tobacco: Never Used  . Alcohol Use: No   Family History  Problem Relation Age of Onset  . Stroke Mother   . Kidney disease Father   . Coronary artery disease Father   . Aneurysm Father     AAA  . Hypertension Father   . Seizures Sister   . Heart attack Maternal Grandfather   . Cancer Sister     ovarian/uterus  . Tongue cancer Sister   . Colon cancer Neg Hx    Allergies  Allergen Reactions  . Requip [Ropinirole Hcl] Other (See Comments)    Headache Feels "sick"   Current Outpatient Prescriptions on File Prior to Visit  Medication Sig Dispense Refill  . fluticasone (FLONASE) 50 MCG/ACT nasal spray Place 2 sprays into both nostrils daily as needed.      Marland Kitchen losartan (COZAAR) 50 MG tablet Take 1 tablet (50 mg total) by mouth daily.  30 tablet  11  . spironolactone (ALDACTONE) 50 MG tablet TAKE 1 TABLET (50 MG TOTAL) BY MOUTH DAILY.  30 tablet  5   No current facility-administered medications on file prior to visit.      Review of Systems Review of Systems  Constitutional: Negative for fever, appetite change, fatigue and unexpected weight change.  Eyes: Negative  for pain and visual disturbance.  Respiratory: Negative for cough and shortness of breath.   Cardiovascular: Negative for cp or palpitations    Gastrointestinal: Negative for nausea, diarrhea and constipation.  Genitourinary: Negative for urgency and frequency.  Skin: Negative for pallor or rash  pos for itchy whelp/ insect bite  Neurological: Negative for weakness, light-headedness, numbness and headaches.  Hematological: Negative for adenopathy. Does not bruise/bleed easily.  Psychiatric/Behavioral: Negative for dysphoric mood. The patient is not nervous/anxious.         Objective:   Physical Exam  Constitutional: She appears well-developed and well-nourished.  obese and well appearing   HENT:  Head: Normocephalic and atraumatic.  Eyes: Conjunctivae and EOM are normal. Pupils are  equal, round, and reactive to light.  Neck: Normal range of motion. Neck supple.  Cardiovascular: Regular rhythm and normal heart sounds.   Pulmonary/Chest: Effort normal and breath sounds normal. No respiratory distress. She has no wheezes.  Musculoskeletal: She exhibits no edema and no tenderness.  Lymphadenopathy:    She has no cervical adenopathy.  Neurological: She is alert. She has normal reflexes.  Skin: Skin is warm and dry. No rash noted. There is erythema. No pallor.  Whelp on the back of R thigh - 2-3  Cm oval  2 small scabs in center No drainage Non tender    Psychiatric: She has a normal mood and affect.          Assessment & Plan:   Problem List Items Addressed This Visit     Other   Insect bite - Primary     inflammed without abscess formation or drainage  slt streaking  Cover with bactrim for poss bacterial infection  Elocon cream topically for itch Disc what to watch for -inc redness/fever Update if not starting to improve in a week or if worsening

## 2014-01-10 NOTE — Progress Notes (Signed)
Pre visit review using our clinic review tool, if applicable. No additional management support is needed unless otherwise documented below in the visit note. 

## 2014-01-10 NOTE — Assessment & Plan Note (Signed)
inflammed without abscess formation or drainage  slt streaking  Cover with bactrim for poss bacterial infection  Elocon cream topically for itch Disc what to watch for -inc redness/fever Update if not starting to improve in a week or if worsening

## 2014-01-10 NOTE — Patient Instructions (Signed)
You have an insect bite that is inflammed and possibly infected  Take the antibiotic as directed - wear sunscreen - you will burn more easily  Use elocon cream as directed  Keep cool Clean with antibacterial soap and water   Update if not starting to improve in a week or if worsening

## 2014-01-29 ENCOUNTER — Telehealth: Payer: Self-pay | Admitting: Family Medicine

## 2014-01-29 NOTE — Telephone Encounter (Signed)
Work her anywhere there are 2 15 min slots together. Let me know if there are not any

## 2014-01-29 NOTE — Telephone Encounter (Signed)
Pt is needing a CPE w/some DMV forms completed (not a DOT CPE).  Due to some miscommunications w/the DMV, pt's driver's license have been invalid since May and she's can't get them reinstated until she has a CPE.  She needs this done this week if possible.  Can you accommodate pt a CPE apptmt this week? Thank you.

## 2014-01-30 ENCOUNTER — Telehealth: Payer: Self-pay | Admitting: Family Medicine

## 2014-01-30 DIAGNOSIS — E538 Deficiency of other specified B group vitamins: Secondary | ICD-10-CM

## 2014-01-30 DIAGNOSIS — D509 Iron deficiency anemia, unspecified: Secondary | ICD-10-CM

## 2014-01-30 DIAGNOSIS — E785 Hyperlipidemia, unspecified: Secondary | ICD-10-CM

## 2014-01-30 NOTE — Telephone Encounter (Signed)
Message copied by Jinny Sanders on Tue Jan 30, 2014 11:02 PM ------      Message from: Ellamae Sia      Created: Tue Jan 30, 2014  4:30 PM      Regarding: Lab orders for Wednesday, 8.26.15       Patient is scheduled for CPX labs, please order future labs, Thanks , Terri       ------

## 2014-01-30 NOTE — Telephone Encounter (Signed)
Scheduled for 02/01/2014

## 2014-01-31 ENCOUNTER — Other Ambulatory Visit (INDEPENDENT_AMBULATORY_CARE_PROVIDER_SITE_OTHER): Payer: BC Managed Care – PPO

## 2014-01-31 DIAGNOSIS — D509 Iron deficiency anemia, unspecified: Secondary | ICD-10-CM

## 2014-01-31 DIAGNOSIS — I10 Essential (primary) hypertension: Secondary | ICD-10-CM

## 2014-01-31 DIAGNOSIS — E785 Hyperlipidemia, unspecified: Secondary | ICD-10-CM

## 2014-01-31 LAB — CBC WITH DIFFERENTIAL/PLATELET
BASOS ABS: 0 10*3/uL (ref 0.0–0.1)
Basophils Relative: 0.6 % (ref 0.0–3.0)
EOS PCT: 3.2 % (ref 0.0–5.0)
Eosinophils Absolute: 0.2 10*3/uL (ref 0.0–0.7)
HCT: 39.5 % (ref 36.0–46.0)
Hemoglobin: 13.8 g/dL (ref 12.0–15.0)
LYMPHS ABS: 1.7 10*3/uL (ref 0.7–4.0)
Lymphocytes Relative: 28.2 % (ref 12.0–46.0)
MCHC: 34.8 g/dL (ref 30.0–36.0)
MCV: 95 fl (ref 78.0–100.0)
MONOS PCT: 10.4 % (ref 3.0–12.0)
Monocytes Absolute: 0.6 10*3/uL (ref 0.1–1.0)
NEUTROS PCT: 57.6 % (ref 43.0–77.0)
Neutro Abs: 3.4 10*3/uL (ref 1.4–7.7)
PLATELETS: 233 10*3/uL (ref 150.0–400.0)
RBC: 4.16 Mil/uL (ref 3.87–5.11)
RDW: 12.9 % (ref 11.5–15.5)
WBC: 6 10*3/uL (ref 4.0–10.5)

## 2014-01-31 LAB — COMPREHENSIVE METABOLIC PANEL
ALBUMIN: 3.9 g/dL (ref 3.5–5.2)
ALT: 28 U/L (ref 0–35)
AST: 27 U/L (ref 0–37)
Alkaline Phosphatase: 41 U/L (ref 39–117)
BILIRUBIN TOTAL: 0.6 mg/dL (ref 0.2–1.2)
BUN: 14 mg/dL (ref 6–23)
CO2: 23 mEq/L (ref 19–32)
Calcium: 8.8 mg/dL (ref 8.4–10.5)
Chloride: 105 mEq/L (ref 96–112)
Creatinine, Ser: 0.8 mg/dL (ref 0.4–1.2)
GFR: 86.69 mL/min (ref >60.00)
GLUCOSE: 81 mg/dL (ref 70–99)
POTASSIUM: 3.9 meq/L (ref 3.5–5.1)
SODIUM: 137 meq/L (ref 135–145)
TOTAL PROTEIN: 7.3 g/dL (ref 6.0–8.3)

## 2014-01-31 LAB — LIPID PANEL
CHOLESTEROL: 149 mg/dL (ref 0–200)
HDL: 37 mg/dL — ABNORMAL LOW (ref >39.00)
LDL CALC: 82 mg/dL (ref 0–99)
NONHDL: 112
Total CHOL/HDL Ratio: 4
Triglycerides: 150 mg/dL — ABNORMAL HIGH (ref 0.0–149.0)
VLDL: 30 mg/dL (ref 0.0–40.0)

## 2014-02-01 ENCOUNTER — Ambulatory Visit (INDEPENDENT_AMBULATORY_CARE_PROVIDER_SITE_OTHER): Payer: BC Managed Care – PPO | Admitting: Family Medicine

## 2014-02-01 ENCOUNTER — Encounter: Payer: Self-pay | Admitting: Family Medicine

## 2014-02-01 VITALS — BP 100/72 | HR 89 | Temp 98.0°F | Ht 67.0 in | Wt 258.0 lb

## 2014-02-01 DIAGNOSIS — I10 Essential (primary) hypertension: Secondary | ICD-10-CM

## 2014-02-01 DIAGNOSIS — E785 Hyperlipidemia, unspecified: Secondary | ICD-10-CM

## 2014-02-01 NOTE — Progress Notes (Signed)
Subjective:    Patient ID: Michele Hall, female    DOB: 22-Aug-1965, 48 y.o.   MRN: [id number]  HPI  48 year old female presents for her annual exam.   She has applied for a bus driving job. She needs form filled out for [facility].  Hypertension: well controlled on aldactone and lisinopril.   BP Readings from Last 3 Encounters:  02/01/14 100/72  01/10/14 90/60  09/06/13 109/64  Using medication without problems or lightheadedness:  occ likely from ears  Chest pain with exertion:None Edema:None Short of breath:None Average home BPs: not checking Other issues:  Elevated Cholesterol: LDL at goal <130 on no med  Lab Results  Component Value Date   CHOL 149 01/31/2014   HDL 37.00* 01/31/2014   LDLCALC 82 01/31/2014   LDLDIRECT 84.1 07/03/2013   TRIG 150.0* 01/31/2014   CHOLHDL 4 01/31/2014   Muscle aches: None Diet compliance:moderate Exercise: none Other complaints:  Multiple skin tags at neck under arms and breast that get irritated with rubbing.    Review of Systems  Constitutional: Negative for fever and fatigue.  HENT: Negative for ear pain.   Eyes: Negative for pain.  Respiratory: Negative for chest tightness and shortness of breath.   Cardiovascular: Negative for chest pain, palpitations and leg swelling.  Gastrointestinal: Negative for abdominal pain.  Genitourinary: Negative for dysuria.       Objective:   Physical Exam  Constitutional: Vital signs are normal. She appears well-developed and well-nourished. She is cooperative.  Non-toxic appearance. She does not appear ill. No distress.  obese  HENT:  Head: Normocephalic.  Right Ear: Hearing, tympanic membrane, external ear and ear canal normal. Tympanic membrane is not erythematous, not retracted and not bulging.  Left Ear: Hearing, tympanic membrane, external ear and ear canal normal. Tympanic membrane is not erythematous, not retracted and not bulging.  Nose: No mucosal edema or rhinorrhea. Right sinus  exhibits no maxillary sinus tenderness and no frontal sinus tenderness. Left sinus exhibits no maxillary sinus tenderness and no frontal sinus tenderness.  Mouth/Throat: Uvula is midline, oropharynx is clear and moist and mucous membranes are normal.  Eyes: Conjunctivae, EOM and lids are normal. Pupils are equal, round, and reactive to light. Lids are everted and swept, no foreign bodies found.  Neck: Trachea normal and normal range of motion. Neck supple. Carotid bruit is not present. No mass and no thyromegaly present.  Cardiovascular: Normal rate, regular rhythm, S1 normal, S2 normal, normal heart sounds, intact distal pulses and normal pulses.  Exam reveals no gallop and no friction rub.   No murmur heard. Pulmonary/Chest: Effort normal and breath sounds normal. Not tachypneic. No respiratory distress. She has no decreased breath sounds. She has no wheezes. She has no rhonchi. She has no rales.  Abdominal: Soft. Normal appearance and bowel sounds are normal. There is no tenderness.  Neurological: She is alert.  Skin: Skin is warm, dry and intact. No rash noted.  Multiple skin tags around neck and axillae B and under breasts.  Psychiatric: Her speech is normal and behavior is normal. Judgment and thought content normal. Her mood appears not anxious. Cognition and memory are normal. She does not exhibit a depressed mood.          Assessment & Plan:  The patient's preventative maintenance and recommended screening tests for an annual wellness exam were reviewed in full today. Brought up to date unless services declined.  Counselled on the importance of diet, exercise, and its role  in overall health and mortality. The patient's FH and SH was reviewed, including their home life, tobacco status, and drug and alcohol status.   Vaccines:Due for flu and tdap. She refuses both. Mammo/pap/ DVE: per GYN, Dr.A. Has upcoming appt with her. Blood in stool eval by GI Dr. Carlean Purl in last year. From  fisture  09/2013 nml colonoscopy, repeat in 10 years.    PROCEDURE NOTE: 15 skin tags treated with cryotherapy ( 2 cycles each of 1 mm halo) at neck and under arms and breasts B. NO complications, no bleeding.

## 2014-02-01 NOTE — Progress Notes (Signed)
Pre visit review using our clinic review tool, if applicable. No additional management support is needed unless otherwise documented below in the visit note. 

## 2014-02-01 NOTE — Patient Instructions (Signed)
Work on starting regular exercise. Replace animal fats with veggie fats. Stop butter. Work on weight loss.  Look  into Tdap and get it if you have not in last 10 years.

## 2014-03-30 ENCOUNTER — Ambulatory Visit (INDEPENDENT_AMBULATORY_CARE_PROVIDER_SITE_OTHER): Payer: BC Managed Care – PPO | Admitting: Family Medicine

## 2014-03-30 ENCOUNTER — Encounter: Payer: Self-pay | Admitting: Family Medicine

## 2014-03-30 VITALS — BP 90/62 | HR 93 | Temp 98.5°F | Ht 67.0 in | Wt 253.5 lb

## 2014-03-30 DIAGNOSIS — H6983 Other specified disorders of Eustachian tube, bilateral: Secondary | ICD-10-CM

## 2014-03-30 DIAGNOSIS — H6993 Unspecified Eustachian tube disorder, bilateral: Secondary | ICD-10-CM

## 2014-03-30 DIAGNOSIS — J3089 Other allergic rhinitis: Secondary | ICD-10-CM

## 2014-03-30 MED ORDER — PREDNISONE 10 MG PO TABS: ORAL_TABLET | ORAL | Status: DC

## 2014-03-30 NOTE — Progress Notes (Signed)
Pre visit review using our clinic review tool, if applicable. No additional management support is needed unless otherwise documented below in the visit note. 

## 2014-03-30 NOTE — Progress Notes (Signed)
   Subjective:    Patient ID: Michele Hall. Denault, female    DOB: 09-18-1965, 48 y.o.   MRN: [id number]  Otalgia  There is pain in the left ear. This is a new problem. The current episode started 1 to 4 weeks ago. The problem has been gradually worsening. There has been no fever. Associated symptoms include rhinorrhea and a sore throat. Pertinent negatives include no coughing, drainage, ear discharge or rash. Associated symptoms comments: Pain in right jaw, has a canker sore Allergies acting up some; sneeze , nasal congestion. Treatments tried: benadryl, fluticasone. The treatment provided mild relief. Her past medical history is significant for a chronic ear infection. There is no history of hearing loss or a tympanostomy tube.    BP Readings from Last 3 Encounters:  03/30/14 90/62  02/01/14 100/72  01/10/14 90/60     Review of Systems  HENT: Positive for ear pain, rhinorrhea and sore throat. Negative for ear discharge.   Respiratory: Negative for cough.   Skin: Negative for rash.       Objective:   Physical Exam  Constitutional: Vital signs are normal. She appears well-developed and well-nourished. She is cooperative.  Non-toxic appearance. She does not appear ill. No distress.  HENT:  Head: Normocephalic.  Right Ear: Hearing, external ear and ear canal normal. Tympanic membrane is not erythematous, not retracted and not bulging. A middle ear effusion is present.  Left Ear: Hearing, tympanic membrane, external ear and ear canal normal. Tympanic membrane is not erythematous, not retracted and not bulging.  Nose: Mucosal edema and rhinorrhea present. Right sinus exhibits no maxillary sinus tenderness and no frontal sinus tenderness. Left sinus exhibits no maxillary sinus tenderness and no frontal sinus tenderness.  Mouth/Throat: Uvula is midline, oropharynx is clear and moist and mucous membranes are normal.  Eyes: Conjunctivae, EOM and lids are normal. Pupils are equal, round, and reactive  to light. Lids are everted and swept, no foreign bodies found.  Neck: Trachea normal and normal range of motion. Neck supple. Carotid bruit is not present. No mass and no thyromegaly present.  Cardiovascular: Normal rate, regular rhythm, S1 normal, S2 normal, normal heart sounds, intact distal pulses and normal pulses.  Exam reveals no gallop and no friction rub.   No murmur heard. Pulmonary/Chest: Effort normal and breath sounds normal. Not tachypneic. No respiratory distress. She has no decreased breath sounds. She has no wheezes. She has no rhonchi. She has no rales.  Neurological: She is alert.  Skin: Skin is warm, dry and intact. No rash noted.  Psychiatric: Her speech is normal and behavior is normal. Judgment normal. Her mood appears not anxious. Cognition and memory are normal. She does not exhibit a depressed mood.          Assessment & Plan:

## 2014-03-30 NOTE — Patient Instructions (Signed)
Hold flonase. Start prednisone taper. When done restart flonase 2 sprays per nostril daily. Start zyrtec at bedtime. Nasal saline irrigation or spray. Wear a mask around chemical cleaners. Call if not improving in 2 weeks.

## 2014-04-04 DIAGNOSIS — J309 Allergic rhinitis, unspecified: Secondary | ICD-10-CM | POA: Insufficient documentation

## 2014-04-04 NOTE — Assessment & Plan Note (Signed)
Start prednisone taper. When done restart flonase 2 sprays per nostril daily.

## 2014-04-04 NOTE — Assessment & Plan Note (Signed)
Start zyrtec at bedtime. Nasal saline irrigation or spray. Wear a mask around chemical cleaners.

## 2014-04-09 ENCOUNTER — Encounter: Payer: Self-pay | Admitting: Family Medicine

## 2014-05-01 ENCOUNTER — Telehealth: Payer: Self-pay | Admitting: Internal Medicine

## 2014-05-01 NOTE — Telephone Encounter (Signed)
Patient showed up at [facility] with terrible rectal pain due to fissure.  She failed to return here for follow up. I have confirmed that she has a refill of her NTG at [facility].  [facility] can't see her until after the holiday.  Jearld Fenton will direct her to pick up refill of NTG at [facility] and also advise her to start Recticare to rectal area PRN.

## 2014-05-17 ENCOUNTER — Ambulatory Visit: Payer: BC Managed Care – PPO | Admitting: Family Medicine

## 2014-05-28 ENCOUNTER — Other Ambulatory Visit: Payer: Self-pay | Admitting: Family Medicine

## 2014-07-09 ENCOUNTER — Other Ambulatory Visit: Payer: Self-pay | Admitting: Family Medicine

## 2014-07-09 NOTE — Telephone Encounter (Signed)
Last office visit 03/24/2014.  Omeprazole is not on current medication list.  Ok to refill?

## 2014-08-09 ENCOUNTER — Emergency Department (HOSPITAL_COMMUNITY): Payer: BC Managed Care – PPO

## 2014-08-09 ENCOUNTER — Encounter (HOSPITAL_COMMUNITY): Payer: Self-pay

## 2014-08-09 ENCOUNTER — Emergency Department (HOSPITAL_COMMUNITY)
Admission: EM | Admit: 2014-08-09 | Discharge: 2014-08-10 | Disposition: A | Discharge status: Home / Self Care | Payer: BC Managed Care – PPO | Attending: Emergency Medicine | Admitting: Emergency Medicine

## 2014-08-09 DIAGNOSIS — Z87891 Personal history of nicotine dependence: Secondary | ICD-10-CM | POA: Diagnosis not present

## 2014-08-09 DIAGNOSIS — K219 Gastro-esophageal reflux disease without esophagitis: Secondary | ICD-10-CM | POA: Insufficient documentation

## 2014-08-09 DIAGNOSIS — Z862 Personal history of diseases of the blood and blood-forming organs and certain disorders involving the immune mechanism: Secondary | ICD-10-CM | POA: Insufficient documentation

## 2014-08-09 DIAGNOSIS — Z79899 Other long term (current) drug therapy: Secondary | ICD-10-CM | POA: Diagnosis not present

## 2014-08-09 DIAGNOSIS — Z7952 Long term (current) use of systemic steroids: Secondary | ICD-10-CM | POA: Insufficient documentation

## 2014-08-09 DIAGNOSIS — R52 Pain, unspecified: Secondary | ICD-10-CM

## 2014-08-09 DIAGNOSIS — Z3202 Encounter for pregnancy test, result negative: Secondary | ICD-10-CM | POA: Insufficient documentation

## 2014-08-09 DIAGNOSIS — R109 Unspecified abdominal pain: Secondary | ICD-10-CM | POA: Diagnosis present

## 2014-08-09 DIAGNOSIS — N200 Calculus of kidney: Secondary | ICD-10-CM | POA: Diagnosis not present

## 2014-08-09 DIAGNOSIS — I10 Essential (primary) hypertension: Secondary | ICD-10-CM | POA: Insufficient documentation

## 2014-08-09 LAB — COMPREHENSIVE METABOLIC PANEL
ALK PHOS: 46 U/L (ref 39–117)
ALT: 35 U/L (ref 0–35)
ANION GAP: 13 (ref 5–15)
AST: 35 U/L (ref 0–37)
Albumin: 4.1 g/dL (ref 3.5–5.2)
BUN: 11 mg/dL (ref 6–23)
CO2: 20 mmol/L (ref 19–32)
Calcium: 9.2 mg/dL (ref 8.4–10.5)
Chloride: 103 mmol/L (ref 96–112)
Creatinine, Ser: 0.89 mg/dL (ref 0.50–1.10)
GFR calc non Af Amer: 76 mL/min — ABNORMAL LOW (ref >90)
GFR, EST AFRICAN AMERICAN: 88 mL/min — AB (ref >90)
Glucose, Bld: 120 mg/dL — ABNORMAL HIGH (ref 70–99)
Potassium: 3.8 mmol/L (ref 3.5–5.1)
SODIUM: 136 mmol/L (ref 135–145)
TOTAL PROTEIN: 7.3 g/dL (ref 6.0–8.3)
Total Bilirubin: 0.8 mg/dL (ref 0.3–1.2)

## 2014-08-09 LAB — CBC WITH DIFFERENTIAL/PLATELET
Basophils Absolute: 0 10*3/uL (ref 0.0–0.1)
Basophils Relative: 0 % (ref 0–1)
EOS ABS: 0.1 10*3/uL (ref 0.0–0.7)
EOS PCT: 2 % (ref 0–5)
HCT: 40.8 % (ref 36.0–46.0)
HEMOGLOBIN: 14.8 g/dL (ref 12.0–15.0)
LYMPHS ABS: 1.3 10*3/uL (ref 0.7–4.0)
Lymphocytes Relative: 15 % (ref 12–46)
MCH: 33.3 pg (ref 26.0–34.0)
MCHC: 36.3 g/dL — AB (ref 30.0–36.0)
MCV: 91.7 fL (ref 78.0–100.0)
MONOS PCT: 9 % (ref 3–12)
Monocytes Absolute: 0.7 10*3/uL (ref 0.1–1.0)
Neutro Abs: 6.1 10*3/uL (ref 1.7–7.7)
Neutrophils Relative %: 74 % (ref 43–77)
PLATELETS: 211 10*3/uL (ref 150–400)
RBC: 4.45 MIL/uL (ref 3.87–5.11)
RDW: 12.2 % (ref 11.5–15.5)
WBC: 8.3 10*3/uL (ref 4.0–10.5)

## 2014-08-09 LAB — URINE MICROSCOPIC-ADD ON

## 2014-08-09 LAB — URINALYSIS, ROUTINE W REFLEX MICROSCOPIC
Bilirubin Urine: NEGATIVE
Glucose, UA: NEGATIVE mg/dL
Ketones, ur: NEGATIVE mg/dL
NITRITE: NEGATIVE
Protein, ur: 30 mg/dL — AB
Specific Gravity, Urine: >1.03 — ABNORMAL HIGH (ref 1.005–1.030)
UROBILINOGEN UA: 0.2 mg/dL (ref 0.0–1.0)
pH: 5.5 (ref 5.0–8.0)

## 2014-08-09 LAB — PREGNANCY, URINE: PREG TEST UR: NEGATIVE

## 2014-08-09 IMAGING — CT CT RENAL STONE PROTOCOL
2 of 4 series · 16 of 46 positions shown, 18 images · non-contrast
Comparison: [DATE]

CLINICAL DATA: Left flank pain for 3 days, history of kidney
stones, nausea and vomiting

EXAM:
CT ABDOMEN AND PELVIS WITHOUT CONTRAST
TECHNIQUE: Multidetector CT imaging of the abdomen and pelvis was performed
following the standard protocol without IV contrast.

[Series 2: stone study 5.0 i30f 1 · axial · 0.98mm/px · z∈[-473,-3]mm · 13 of 104 slices shown, 15 images]
[im 5/104  soft-tissue]
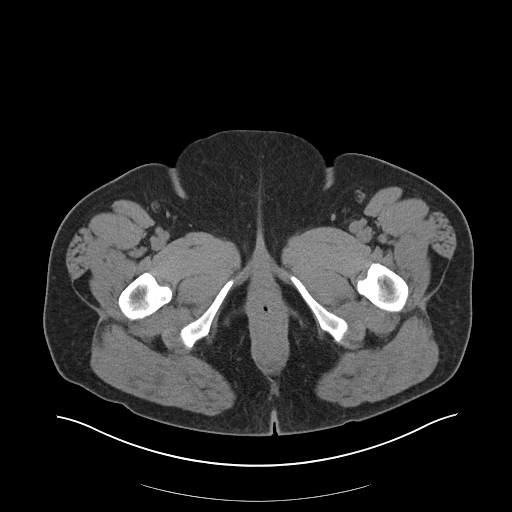
[im 5/104  bone]
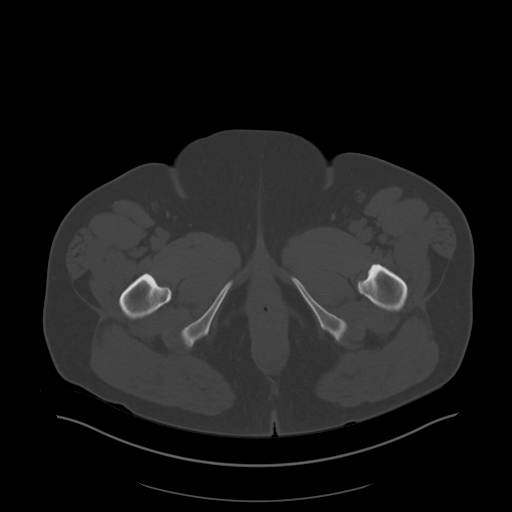
[im 15/104  soft-tissue]
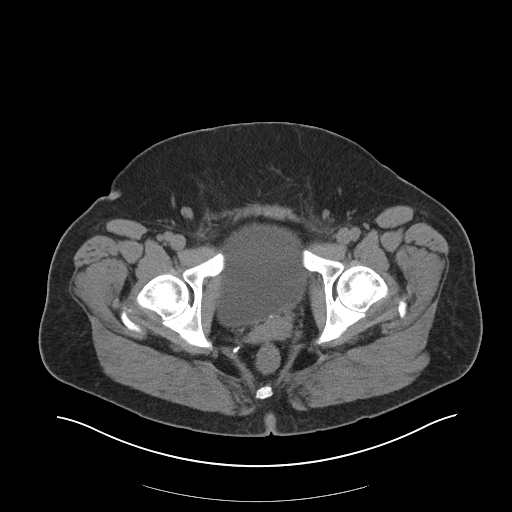
[im 24/104  soft-tissue]
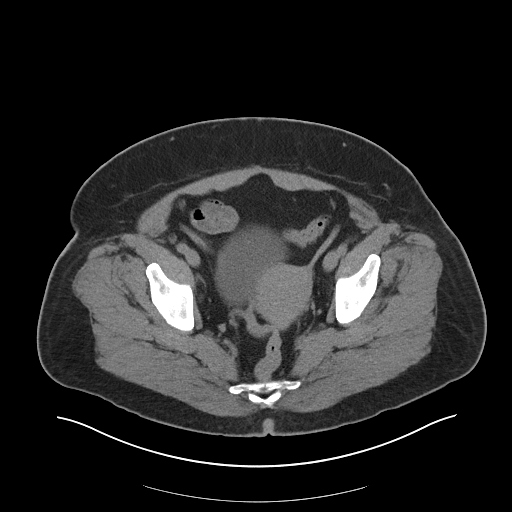
[im 29/104  soft-tissue]
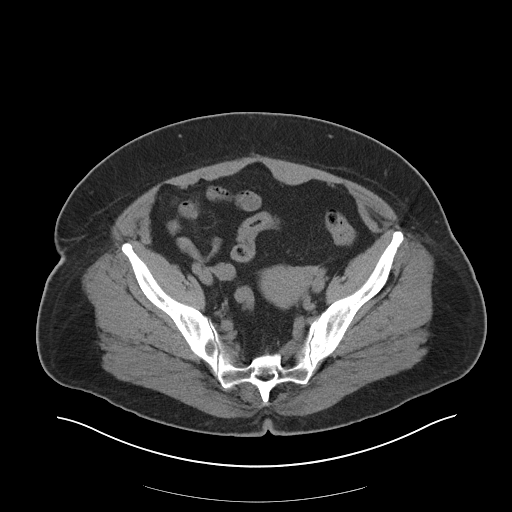
[im 38/104  soft-tissue]
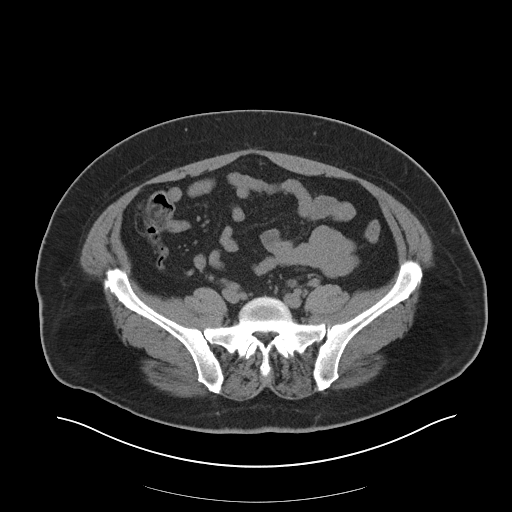
[im 43/104  soft-tissue]
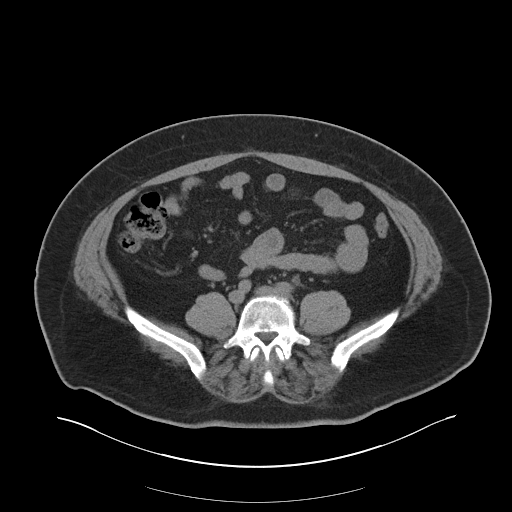
[im 52/104  soft-tissue]
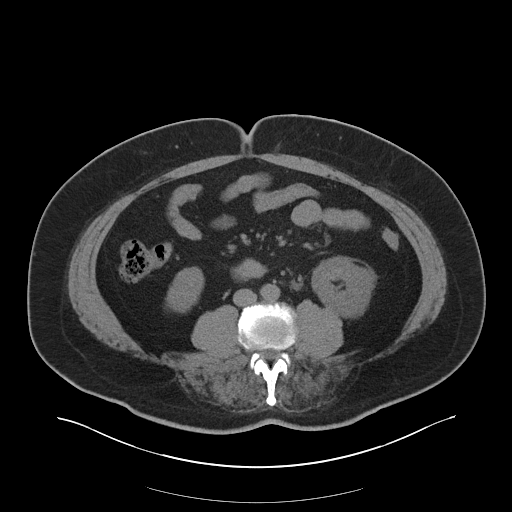
[im 61/104  soft-tissue]
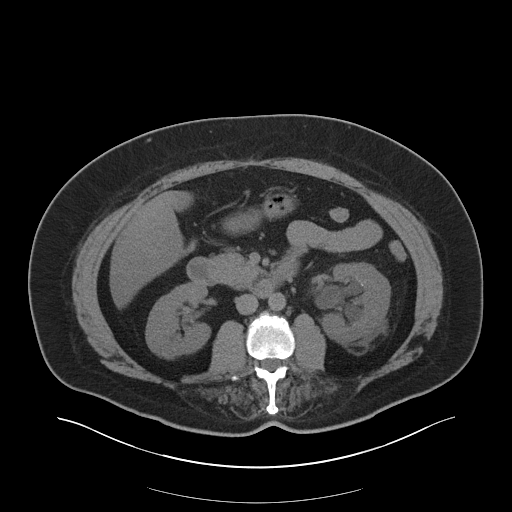
[im 66/104  soft-tissue]
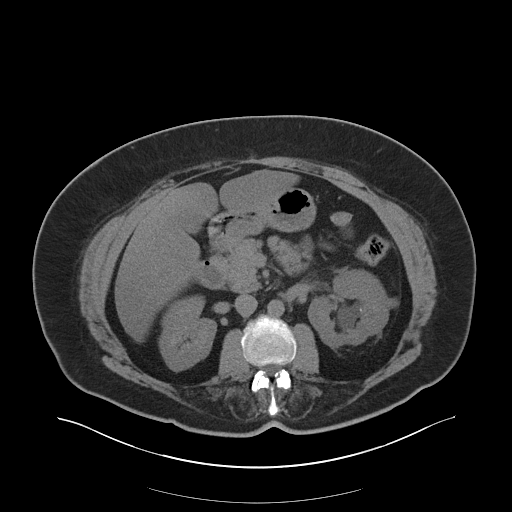
[im 66/104  bone]
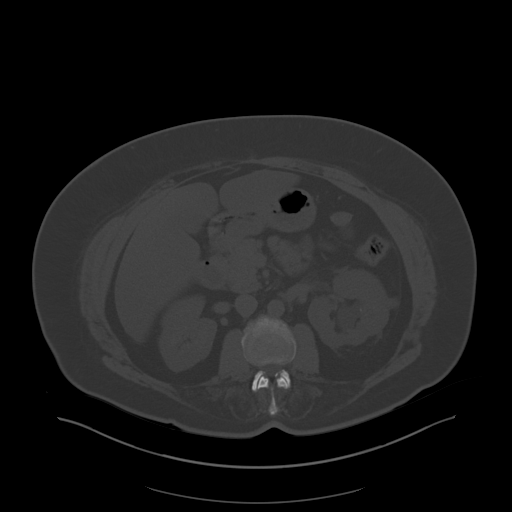
[im 75/104  soft-tissue]
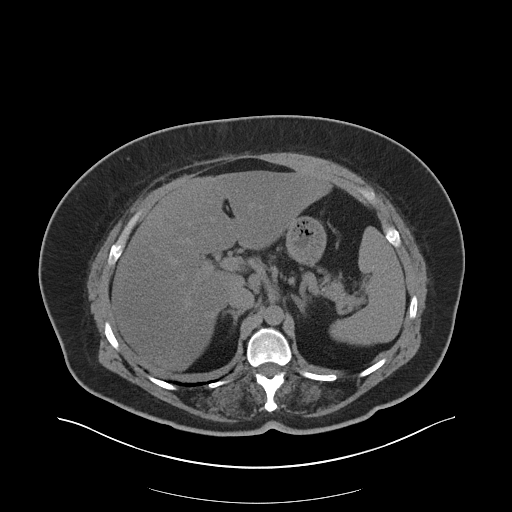
[im 80/104  soft-tissue]
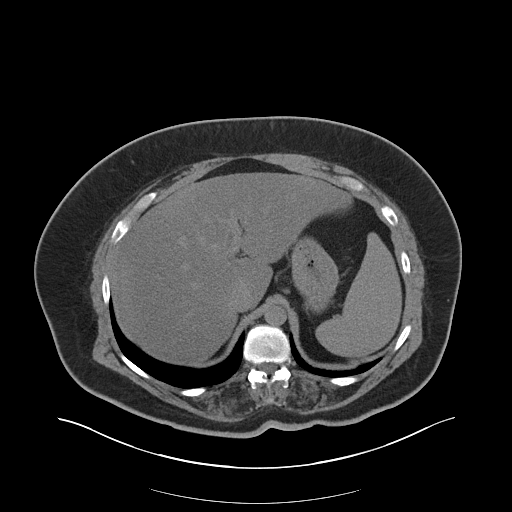
[im 89/104  soft-tissue]
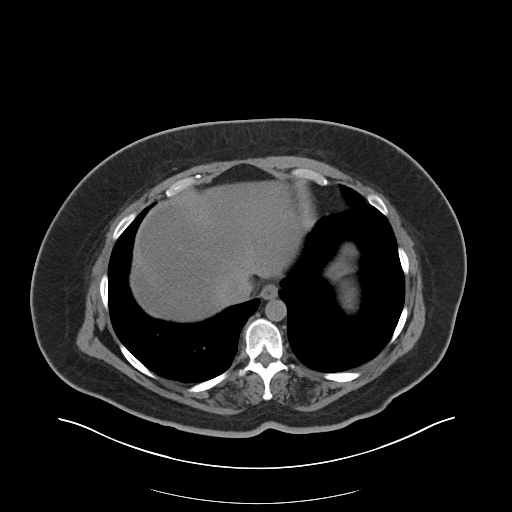
[im 99/104  soft-tissue]
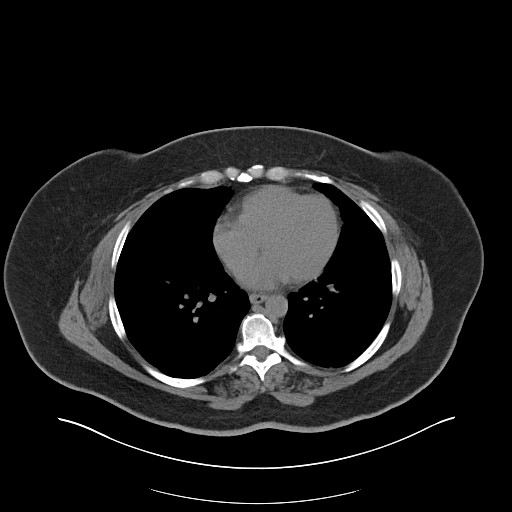

[Series 5: coronal soft tissue · coronal · 0.97mm/px · 3 of 103 slices shown]
[im 35/103  soft-tissue]
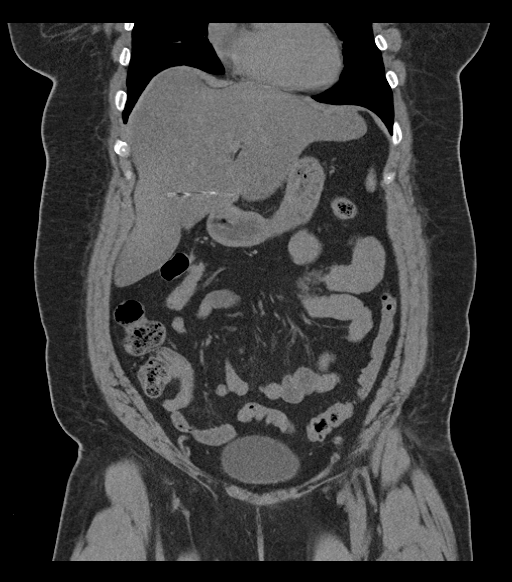
[im 46/103  soft-tissue]
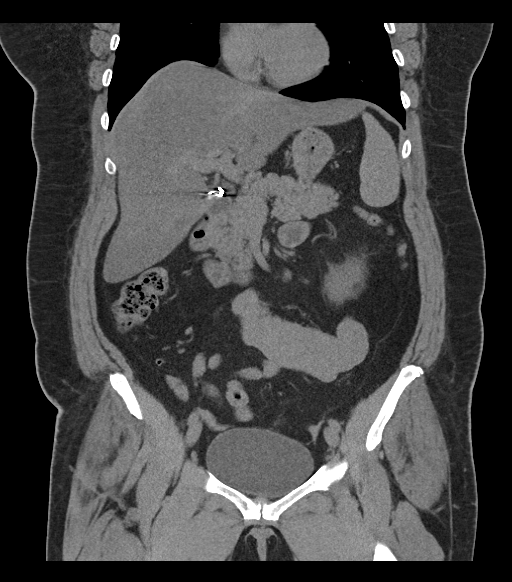
[im 57/103  soft-tissue]
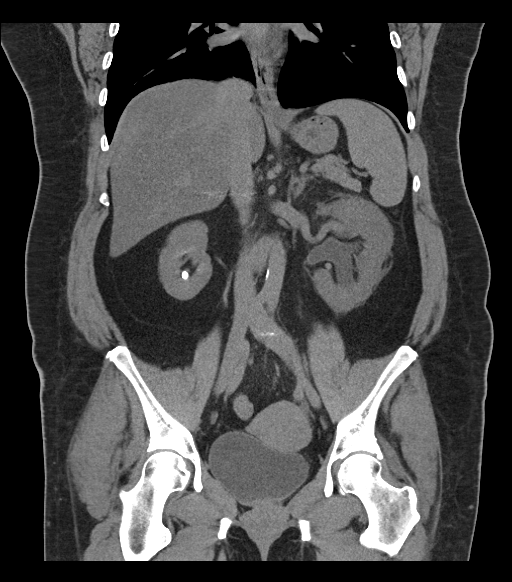

[16 of 46 positions shown; findings below may reference images not displayed]

FINDINGS: The lung bases are unremarkable. Sagittal images of the spine shows
degenerative changes thoracolumbar spine.

Again noted fatty infiltration of the liver. The patient is status
postcholecystectomy. The unenhanced pancreas, spleen and adrenal
glands are unremarkable. Small accessory splenule again noted.

Again noted bilateral nonobstructive nephrolithiasis.

At least 3 or 4 calcified calculi are noted within right kidney. The
largest in lower pole measures 1.4 cm. Largest calculus in midpole
of the right kidney measures 1.2 cm.

At least 5 punctate nonobstructive calcified calculi are noted
within left kidney the largest in midpole measures 5 mm.

There is mild left hydronephrosis and left hydroureter. Mild left
perinephric stranding. No calcified ureteral calculi are noted
bilaterally.

In axial image 89 there is a 6.3 mm calcified calculus in posterior
aspect of the urinary bladder. This is highly suspicious for a
recent passed left ureteral calculus. Clinical correlation is
necessary.

No small bowel obstruction. No ascites or free air. No adenopathy.
Unenhanced uterus and adnexa are unremarkable. No pericecal
inflammation. Normal appendix noted in axial image 67.

Abdominal aorta is unremarkable.
IMPRESSION: 1. There is bilateral nonobstructive nephrolithiasis.
2. There is mild left hydronephrosis and left hydroureter. Mild left
perinephric stranding. No calcified ureteral calculi are noted
bilaterally.
3. There is a 6.3 mm calcified calculus within urinary bladder
posterior aspect suspicious poor a recent passed left ureteral
calculus. Left urinary tract inflammation cannot be excluded.
Clinical correlation is necessary.
4. Normal appendix.  No pericecal inflammation.
5. Fatty infiltration of the liver.  Status postcholecystectomy.

## 2014-08-09 MED ORDER — ONDANSETRON 4 MG PO TBDP: ORAL_TABLET | ORAL | Status: DC

## 2014-08-09 MED ORDER — HYDROMORPHONE HCL 1 MG/ML IJ SOLN
1.0000 mg | Freq: Once | INTRAMUSCULAR | Status: AC
  Administered 2014-08-09: 1 mg via INTRAVENOUS
  Filled 2014-08-09: qty 1

## 2014-08-09 MED ORDER — OXYCODONE-ACETAMINOPHEN 5-325 MG PO TABS: 2.0000 | ORAL_TABLET | Freq: Four times a day (QID) | ORAL | Status: DC | PRN

## 2014-08-09 MED ORDER — LORAZEPAM 2 MG/ML IJ SOLN
0.5000 mg | Freq: Once | INTRAMUSCULAR | Status: AC
  Administered 2014-08-09: 0.5 mg via INTRAVENOUS
  Filled 2014-08-09: qty 1

## 2014-08-09 MED ORDER — ONDANSETRON HCL 4 MG/2ML IJ SOLN
4.0000 mg | Freq: Once | INTRAMUSCULAR | Status: AC
Start: 2014-08-09 — End: 2014-08-09
  Administered 2014-08-09: 4 mg via INTRAVENOUS
  Filled 2014-08-09: qty 2

## 2014-08-09 NOTE — ED Provider Notes (Signed)
CSN: [id number]     Arrival date & time 08/09/14  0815 History   First MD Initiated Contact with Patient 08/09/14 [phone]     Chief Complaint  Patient presents with  . Flank Pain     (Consider location/radiation/quality/duration/timing/severity/associated sxs/prior Treatment) Patient is a 49 y.o. female presenting with flank pain. The history is provided by the patient (the pt complains of severe left flank pain,  became severe today).  Flank Pain This is a new problem. The current episode started 6 to 12 hours ago. The problem occurs constantly. The problem has not changed since onset.Associated symptoms include abdominal pain. Pertinent negatives include no chest pain and no headaches. Nothing aggravates the symptoms. Nothing relieves the symptoms.    Past Medical History  Diagnosis Date  . Hypertension   . Anemia   . GERD (gastroesophageal reflux disease)   . Morbid obesity   . Kidney stones   . Gallstones   . Anal fissure - posterior 2015  . IBS    Past Surgical History  Procedure Laterality Date  . Cholecystectomy    . Tubal ligation    . Dilitation & currettage/hystroscopy with hydrothermal ablation N/A 09/13/2012    Procedure: DILATATION & CURETTAGE/HYSTEROSCOPY WITH HYDROTHERMAL ABLATION;  Surgeon: Osborne Oman, MD;  Location: [facility];  Service: Gynecology;  Laterality: N/A;   Family History  Problem Relation Age of Onset  . Stroke Mother   . Kidney disease Father   . Coronary artery disease Father   . Aneurysm Father     AAA  . Hypertension Father   . Seizures Sister   . Heart attack Maternal Grandfather   . Cancer Sister     ovarian/uterus  . Tongue cancer Sister   . Colon cancer Neg Hx    History  Substance Use Topics  . Smoking status: Former Smoker    Quit date: 02/09/2002  . Smokeless tobacco: Never Used  . Alcohol Use: No   OB History    Gravida Para Term Preterm AB TAB SAB Ectopic Multiple Living   2 2 2       2      Review of Systems   Constitutional: Negative for appetite change and fatigue.  HENT: Negative for congestion, ear discharge and sinus pressure.   Eyes: Negative for discharge.  Respiratory: Negative for cough.   Cardiovascular: Negative for chest pain.  Gastrointestinal: Positive for abdominal pain. Negative for diarrhea.  Genitourinary: Positive for flank pain. Negative for frequency and hematuria.  Musculoskeletal: Negative for back pain.  Skin: Negative for rash.  Neurological: Negative for seizures and headaches.  Psychiatric/Behavioral: Negative for hallucinations.      Allergies  Requip  Home Medications   Prior to Admission medications   Medication Sig Start Date End Date Taking? Authorizing Provider  fluticasone (FLONASE) 50 MCG/ACT nasal spray Place 2 sprays into both nostrils daily as needed for allergies.  06/16/13  Yes Amy E Bedsole, MD  losartan (COZAAR) 50 MG tablet TAKE 1 TABLET BY MOUTH EVERY DAY 05/28/14  Yes Amy E Bedsole, MD  spironolactone (ALDACTONE) 50 MG tablet TAKE 1 TABLET (50 MG TOTAL) BY MOUTH DAILY. 05/28/14  Yes Amy E Bedsole, MD  omeprazole (PRILOSEC) 40 MG capsule TAKE 1 CAPSULE (40 MG TOTAL) BY MOUTH DAILY. Patient not taking: Reported on 08/09/2014 07/09/14   Jinny Sanders, MD  ondansetron (ZOFRAN ODT) 4 MG disintegrating tablet 4mg  ODT q4 hours prn nausea/vomit 08/09/14   Maudry Diego, MD  oxyCODONE-acetaminophen (PERCOCET) [phone]  MG per tablet Take 2 tablets by mouth every 6 (six) hours as needed. 08/09/14   Maudry Diego, MD  predniSONE (DELTASONE) 10 MG tablet 3 tabs by mouth daily x 3 days, then 2 tabs by mouth daily x 2 days then 1 tab by mouth daily x 2 days Patient not taking: Reported on 08/09/2014 03/30/14   Amy E Bedsole, MD   BP 113/65 mmHg  Pulse 86  Temp(Src) 97.5 F (36.4 C) (Oral)  Resp 20  Wt 260 lb (117.935 kg)  SpO2 96% Physical Exam  Constitutional: She is oriented to person, place, and time. She appears well-developed.  HENT:  Head: Normocephalic.   Eyes: Conjunctivae and EOM are normal. No scleral icterus.  Neck: Neck supple. No thyromegaly present.  Cardiovascular: Normal rate and regular rhythm.  Exam reveals no gallop and no friction rub.   No murmur heard. Pulmonary/Chest: No stridor. She has no wheezes. She has no rales. She exhibits no tenderness.  Abdominal: She exhibits no distension. There is no tenderness. There is no rebound.  Genitourinary:  Tender left flank  Musculoskeletal: Normal range of motion. She exhibits no edema.  Lymphadenopathy:    She has no cervical adenopathy.  Neurological: She is oriented to person, place, and time. She exhibits normal muscle tone. Coordination normal.  Skin: No rash noted. No erythema.  Psychiatric: She has a normal mood and affect. Her behavior is normal.    ED Course  Procedures (including critical care time) Labs Review Labs Reviewed  URINALYSIS, ROUTINE W REFLEX MICROSCOPIC - Abnormal; Notable for the following:    APPearance CLOUDY (*)    Specific Gravity, Urine >1.030 (*)    Hgb urine dipstick LARGE (*)    Protein, ur 30 (*)    Leukocytes, UA SMALL (*)    All other components within normal limits  CBC WITH DIFFERENTIAL/PLATELET - Abnormal; Notable for the following:    MCHC 36.3 (*)    All other components within normal limits  COMPREHENSIVE METABOLIC PANEL - Abnormal; Notable for the following:    Glucose, Bld 120 (*)    GFR calc non Af Amer 76 (*)    GFR calc Af Amer 88 (*)    All other components within normal limits  PREGNANCY, URINE  URINE MICROSCOPIC-ADD ON    Imaging Review Ct Renal Stone Study  08/09/2014   CLINICAL DATA:  Left flank pain for 3 days, history of kidney stones, nausea and vomiting  EXAM: CT ABDOMEN AND PELVIS WITHOUT CONTRAST  TECHNIQUE: Multidetector CT imaging of the abdomen and pelvis was performed following the standard protocol without IV contrast.  COMPARISON:  11/20/2012  FINDINGS: The lung bases are unremarkable. Sagittal images of the  spine shows degenerative changes thoracolumbar spine.  Again noted fatty infiltration of the liver. The patient is status postcholecystectomy. The unenhanced pancreas, spleen and adrenal glands are unremarkable. Small accessory splenule again noted.  Again noted bilateral nonobstructive nephrolithiasis.  At least 3 or 4 calcified calculi are noted within right kidney. The largest in lower pole measures 1.4 cm. Largest calculus in midpole of the right kidney measures 1.2 cm.  At least 5 punctate nonobstructive calcified calculi are noted within left kidney the largest in midpole measures 5 mm.  There is mild left hydronephrosis and left hydroureter. Mild left perinephric stranding. No calcified ureteral calculi are noted bilaterally.  In axial image 89 there is a 6.3 mm calcified calculus in posterior aspect of the urinary bladder. This is highly suspicious for  a recent passed left ureteral calculus. Clinical correlation is necessary.  No small bowel obstruction. No ascites or free air. No adenopathy. Unenhanced uterus and adnexa are unremarkable. No pericecal inflammation. Normal appendix noted in axial image 67.  Abdominal aorta is unremarkable.  IMPRESSION: 1. There is bilateral nonobstructive nephrolithiasis. 2. There is mild left hydronephrosis and left hydroureter. Mild left perinephric stranding. No calcified ureteral calculi are noted bilaterally. 3. There is a 6.3 mm calcified calculus within urinary bladder posterior aspect suspicious poor a recent passed left ureteral calculus. Left urinary tract inflammation cannot be excluded. Clinical correlation is necessary. 4. Normal appendix.  No pericecal inflammation. 5. Fatty infiltration of the liver.  Status postcholecystectomy.   Electronically Signed   By: Lahoma Crocker M.D.   On: 08/09/2014 10:45     EKG Interpretation None      MDM   Final diagnoses:  Pain  Kidney stone    Kidney stone now in bladder.  Refer to [facility].  tx with vicodin  and zofra    Maudry Diego, MD 08/09/14 1105

## 2014-08-09 NOTE — ED Notes (Signed)
Pt here for left flank pain for the past 3 days. Pt has hx of kidney stones. Has been having nausea and vomiting.

## 2014-08-09 NOTE — Discharge Instructions (Signed)
Drink plenty of fluids.  Follow-up with [facility] next week 

## 2014-12-03 ENCOUNTER — Other Ambulatory Visit: Payer: Self-pay | Admitting: Family Medicine

## 2014-12-03 NOTE — Telephone Encounter (Signed)
Please call and schedule CPE with fasting labs sometime after 02/02/2015.

## 2014-12-03 NOTE — Telephone Encounter (Signed)
Called pt, scheduled CPE with fasting labs prior per DL instruct / lt

## 2014-12-06 ENCOUNTER — Other Ambulatory Visit: Payer: Self-pay | Admitting: *Deleted

## 2014-12-06 MED ORDER — FLUTICASONE PROPIONATE 50 MCG/ACT NA SUSP: 2.0000 | Freq: Every day | NASAL | Status: DC

## 2014-12-17 ENCOUNTER — Ambulatory Visit (INDEPENDENT_AMBULATORY_CARE_PROVIDER_SITE_OTHER): Payer: BC Managed Care – PPO | Admitting: Primary Care

## 2014-12-17 ENCOUNTER — Encounter: Payer: Self-pay | Admitting: Primary Care

## 2014-12-17 VITALS — BP 108/78 | HR 84 | Temp 98.0°F | Ht 67.0 in | Wt 257.8 lb

## 2014-12-17 DIAGNOSIS — R0982 Postnasal drip: Secondary | ICD-10-CM | POA: Diagnosis not present

## 2014-12-17 NOTE — Patient Instructions (Signed)
Start taking daily antihistamine for throat drainage (Claritin, Zyrtec, or Allegra). These may be purchased over the counter.  Continue Flonase as needed for nasal congestion.  Call us if no improvement in symptoms in the next 3 weeks. We can consider referral to ENT for further evaluation.  It was nice meeting you!

## 2014-12-17 NOTE — Progress Notes (Signed)
Subjective:    Patient ID: Michele Hall, female    DOB: 1965-08-29, 49 y.o.   MRN: [id number]  HPI  Michele Hall is a 49 year old female who presents today with a chief complaint of right ear and upper jaw pain. Her throat isn't sore necessarily, but she feels as though sometime is stuck to the back of her throat. She also reports nasal congestion, post nasal drip, and firm sensation to right upper jaw. These symptoms have been present since her vacation [place]. Denies fevers, chills, cough. She's been using Flonase infrequently. Overall she feels well.  Review of Systems  Constitutional: Negative for fever and chills.  HENT: Positive for congestion, ear pain, facial swelling and postnasal drip. Negative for sinus pressure, sore throat and trouble swallowing.   Respiratory: Negative for cough and shortness of breath.   Cardiovascular: Negative for chest pain.  Gastrointestinal: Negative for nausea.  Musculoskeletal: Negative for myalgias.       Past Medical History  Diagnosis Date  . Hypertension   . Anemia   . GERD (gastroesophageal reflux disease)   . Morbid obesity   . Kidney stones   . Gallstones   . Anal fissure - posterior 2015  . IBS     History   Social History  . Marital Status: Married    Spouse Name: N/A  . Number of Children: 2  . Years of Education: N/A   Occupational History  . house cleaner/ former bus driver    Social History Main Topics  . Smoking status: Former Smoker    Quit date: 02/09/2002  . Smokeless tobacco: Never Used  . Alcohol Use: No  . Drug Use: No  . Sexual Activity:    Partners: Male    Birth Control/ Protection: Surgical   Other Topics Concern  . Not on file   Social History Narrative   Married, 2 sons   Essentially no caffeine          Past Surgical History  Procedure Laterality Date  . Cholecystectomy    . Tubal ligation    . Dilitation & currettage/hystroscopy with hydrothermal ablation N/A  09/13/2012    Procedure: DILATATION & CURETTAGE/HYSTEROSCOPY WITH HYDROTHERMAL ABLATION;  Surgeon: Osborne Oman, MD;  Location: [facility];  Service: Gynecology;  Laterality: N/A;    Family History  Problem Relation Age of Onset  . Stroke Mother   . Kidney disease Father   . Coronary artery disease Father   . Aneurysm Father     AAA  . Hypertension Father   . Seizures Sister   . Heart attack Maternal Grandfather   . Cancer Sister     ovarian/uterus  . Tongue cancer Sister   . Colon cancer Neg Hx     Allergies  Allergen Reactions  . Requip [Ropinirole Hcl] Other (See Comments)    Headache Feels "sick"    Current Outpatient Prescriptions on File Prior to Visit  Medication Sig Dispense Refill  . fluticasone (FLONASE) 50 MCG/ACT nasal spray Place 2 sprays into both nostrils daily. 16 g 11  . losartan (COZAAR) 50 MG tablet TAKE 1 TABLET BY MOUTH EVERY DAY 30 tablet 2  . spironolactone (ALDACTONE) 50 MG tablet TAKE 1 TABLET (50 MG TOTAL) BY MOUTH DAILY. 30 tablet 2  . omeprazole (PRILOSEC) 40 MG capsule TAKE 1 CAPSULE (40 MG TOTAL) BY MOUTH DAILY. (Patient not taking: Reported on 08/09/2014) 30 capsule 4  . ondansetron (ZOFRAN ODT) 4 MG  disintegrating tablet 4mg  ODT q4 hours prn nausea/vomit (Patient not taking: Reported on 12/17/2014) 10 tablet 0  . oxyCODONE-acetaminophen (PERCOCET) 5-325 MG per tablet Take 2 tablets by mouth every 6 (six) hours as needed. (Patient not taking: Reported on 12/17/2014) 20 tablet 0  . predniSONE (DELTASONE) 10 MG tablet 3 tabs by mouth daily x 3 days, then 2 tabs by mouth daily x 2 days then 1 tab by mouth daily x 2 days (Patient not taking: Reported on 08/09/2014) 15 tablet 0   No current facility-administered medications on file prior to visit.    BP 108/78 mmHg  Pulse 84  Temp(Src) 98 F (36.7 C) (Oral)  Ht 5\' 7"  (1.702 m)  Wt 257 lb 12.8 oz (116.937 kg)  BMI 40.37 kg/m2  SpO2 96%    Objective:   Physical Exam  Constitutional: She is oriented  to person, place, and time. She appears well-nourished. She does not appear ill.  HENT:  Right Ear: Tympanic membrane and ear canal normal.  Left Ear: Tympanic membrane and ear canal normal.  Nose: Nose normal.  Mouth/Throat: Oropharynx is clear and moist.  Neck: No thyromegaly present.  Firmness present to skin on right upper sub-mandible when compared to right. Small lymph node noted to neck proximal to thyroid just below chin.  Cardiovascular: Normal rate and regular rhythm.   Pulmonary/Chest: Effort normal and breath sounds normal.  Neurological: She is alert and oriented to person, place, and time.  Skin: Skin is warm and dry.          Assessment & Plan:  Suspect her post nasal drip and ear pain are related to allergies. Vitals stable, does not appear ill.  Encouraged her to try daily antihistamine (claritin, zyrtec, or allegra). Not sure why she has firm sensation to right sub-mandibular region. Will consider referral to ENT if no improvement with daily antihistamine.

## 2014-12-31 ENCOUNTER — Telehealth: Payer: Self-pay

## 2014-12-31 NOTE — Telephone Encounter (Signed)
PLEASE NOTE: All timestamps contained within this report are represented as [place] Standard Time. CONFIDENTIALTY NOTICE: This fax transmission is intended only for the addressee. It contains information that is legally privileged, confidential or otherwise protected from use or disclosure. If you are not the intended recipient, you are strictly prohibited from reviewing, disclosing, copying using or disseminating any of this information or taking any action in reliance on or regarding this information. If you have received this fax in error, please notify us immediately by telephone so that we can arrange for its return to [place]. Phone: [phone], Toll-Free: [phone], Fax: [phone] Page: 1 of 2 Call Id: [id number] [place] Patient Name: [facility] Levit Gender: Female DOB: 28-Aug-1965 Age: 49 Y 11 M 9 D Return Phone Number: [phone] (Primary) Address: City/State/Zip: [place] Client [place] Night - Client Client Site [place] Physician Diona Browner, [place] Contact Type Call Call Type Triage / Clinical Relationship To Patient Self Return Phone Number [phone] (Primary) Chief Complaint Cold Symptom Initial Comment Caller states she has chills, had been off and on at night earlier this week, but has been constant today PreDisposition Did not know what to do Nurse Assessment Nurse: Leilani Merl, RN, Heather Date/Time (Eastern Time): 12/29/2014 1:34:10 PM Confirm and document reason for call. If symptomatic, describe symptoms. ---Caller states she has chills, had been off and on at night earlier this week, but has been constant today. She has a bad headache today. She does not have a fever that he can tell. Has the patient traveled out of the country within the last 30 days? ---Not Applicable Does the patient require triage? ---Yes Related  visit to physician within the last 2 weeks? ---No Does the PT have any chronic conditions? (i.e. diabetes, asthma, etc.) ---Yes List chronic conditions. ---HTN Did the patient indicate they were pregnant? ---No Guidelines Guideline Title Affirmed Question Affirmed Notes Nurse Date/Time (Eastern Time) Headache [1] SEVERE headache (e.g., excruciating) AND [2] "worst headache" of life Standifer, RN, Nira Conn 12/29/2014 1:35:58 PM Disp. Time Eilene Ghazi Time) Disposition Final User 12/29/2014 1:38:39 PM Call Completed Standifer, RN, Nira Conn 12/29/2014 1:37:01 PM Go to ED Now (or PCP triage) Yes Standifer, RN, Soyla Murphy Understands: Yes Disagree/Comply: Comply PLEASE NOTE: All timestamps contained within this report are represented as [place] Standard Time. CONFIDENTIALTY NOTICE: This fax transmission is intended only for the addressee. It contains information that is legally privileged, confidential or otherwise protected from use or disclosure. If you are not the intended recipient, you are strictly prohibited from reviewing, disclosing, copying using or disseminating any of this information or taking any action in reliance on or regarding this information. If you have received this fax in error, please notify us immediately by telephone so that we can arrange for its return to [place]. Phone: [phone], Toll-Free: [phone], Fax: [phone] Page: 2 of 2 Call Id: [id number] Care Advice Given Per Guideline GO TO ED NOW (OR PCP TRIAGE): DRIVING: Another adult should drive. CARE ADVICE given per Headache (Adult) guideline. After Care Instructions Given Call Event Type User Date / Time Description Referrals [facility] - ED

## 2015-01-31 ENCOUNTER — Telehealth: Payer: Self-pay | Admitting: Family Medicine

## 2015-01-31 ENCOUNTER — Other Ambulatory Visit (INDEPENDENT_AMBULATORY_CARE_PROVIDER_SITE_OTHER): Payer: BC Managed Care – PPO

## 2015-01-31 DIAGNOSIS — E538 Deficiency of other specified B group vitamins: Secondary | ICD-10-CM

## 2015-01-31 DIAGNOSIS — E785 Hyperlipidemia, unspecified: Secondary | ICD-10-CM | POA: Diagnosis not present

## 2015-01-31 DIAGNOSIS — D509 Iron deficiency anemia, unspecified: Secondary | ICD-10-CM

## 2015-01-31 LAB — LIPID PANEL
Cholesterol: 161 mg/dL (ref 0–200)
HDL: 37.4 mg/dL — AB (ref >39.00)
LDL Cholesterol: 86 mg/dL (ref 0–99)
NonHDL: 123.51
TRIGLYCERIDES: 190 mg/dL — AB (ref 0.0–149.0)
Total CHOL/HDL Ratio: 4
VLDL: 38 mg/dL (ref 0.0–40.0)

## 2015-01-31 LAB — COMPREHENSIVE METABOLIC PANEL
ALT: 26 U/L (ref 0–35)
AST: 23 U/L (ref 0–37)
Albumin: 4.3 g/dL (ref 3.5–5.2)
Alkaline Phosphatase: 50 U/L (ref 39–117)
BILIRUBIN TOTAL: 0.5 mg/dL (ref 0.2–1.2)
BUN: 14 mg/dL (ref 6–23)
CALCIUM: 9.2 mg/dL (ref 8.4–10.5)
CO2: 24 meq/L (ref 19–32)
Chloride: 105 mEq/L (ref 96–112)
Creatinine, Ser: 0.73 mg/dL (ref 0.40–1.20)
GFR: 90.43 mL/min (ref >60.00)
GLUCOSE: 85 mg/dL (ref 70–99)
POTASSIUM: 4 meq/L (ref 3.5–5.1)
Sodium: 140 mEq/L (ref 135–145)
Total Protein: 7.7 g/dL (ref 6.0–8.3)

## 2015-01-31 LAB — VITAMIN B12: Vitamin B-12: 358 pg/mL (ref 211–911)

## 2015-01-31 NOTE — Telephone Encounter (Signed)
-----   Message from Ellamae Sia sent at 01/23/2015 12:02 PM EDT ----- Regarding: Lab orders for Thursdday, 8.25.16 Patient is scheduled for CPX labs, please order future labs, Thanks , Karna Christmas

## 2015-02-05 ENCOUNTER — Encounter: Payer: Self-pay | Admitting: Family Medicine

## 2015-02-05 ENCOUNTER — Ambulatory Visit (INDEPENDENT_AMBULATORY_CARE_PROVIDER_SITE_OTHER): Payer: BC Managed Care – PPO | Admitting: Family Medicine

## 2015-02-05 VITALS — BP 110/70 | HR 74 | Temp 97.8°F | Ht 67.5 in | Wt 251.0 lb

## 2015-02-05 DIAGNOSIS — Z23 Encounter for immunization: Secondary | ICD-10-CM | POA: Diagnosis not present

## 2015-02-05 DIAGNOSIS — R413 Other amnesia: Secondary | ICD-10-CM

## 2015-02-05 DIAGNOSIS — Z0000 Encounter for general adult medical examination without abnormal findings: Secondary | ICD-10-CM

## 2015-02-05 DIAGNOSIS — E538 Deficiency of other specified B group vitamins: Secondary | ICD-10-CM

## 2015-02-05 DIAGNOSIS — I10 Essential (primary) hypertension: Secondary | ICD-10-CM

## 2015-02-05 DIAGNOSIS — E785 Hyperlipidemia, unspecified: Secondary | ICD-10-CM

## 2015-02-05 DIAGNOSIS — R0683 Snoring: Secondary | ICD-10-CM

## 2015-02-05 NOTE — Assessment & Plan Note (Signed)
Low HDL and high trig. Discussed lifestyle and diet changes.

## 2015-02-05 NOTE — Assessment & Plan Note (Signed)
Well controlled. Continue current medication.  

## 2015-02-05 NOTE — Assessment & Plan Note (Signed)
HAs wide neck, snoring, poor sleep.Marland Kitchen Refer for sleep eval. Rec weight loss.

## 2015-02-05 NOTE — Addendum Note (Signed)
Addended by: Emelia Salisbury C on: 02/05/2015 09:10 AM   Modules accepted: Orders

## 2015-02-05 NOTE — Progress Notes (Signed)
Pre visit review using our clinic review tool, if applicable. No additional management support is needed unless otherwise documented below in the visit note. 

## 2015-02-05 NOTE — Assessment & Plan Note (Addendum)
nml labs. ? Due to menopause or sleep issues.

## 2015-02-05 NOTE — Assessment & Plan Note (Signed)
At goal on supplement.

## 2015-02-05 NOTE — Progress Notes (Signed)
49 year old female presents for her annual exam.  ED visit in 08/2014 for kidney stones. She has several remaining. No further issue since.  Hypertension: well controlled on aldactone and lisinopril.  BP Readings from Last 3 Encounters:  02/05/15 110/70  12/17/14 108/78  08/09/14 107/60  Using medication without problems or lightheadedness: occ likely from ears  Chest pain with exertion:None Edema:None Short of breath:None Average home BPs: not checking Other issues:  Elevated Cholesterol: LDL at goal <130 on no med . Trigs elevated and HDL. Lab Results  Component Value Date   CHOL 161 01/31/2015   HDL 37.40* 01/31/2015   LDLCALC 86 01/31/2015   LDLDIRECT 84.1 07/03/2013   TRIG 190.0* 01/31/2015   CHOLHDL 4 01/31/2015   Muscle aches: None Diet compliance:moderate Exercise: none Other complaints:  Wt Readings from Last 3 Encounters:  02/05/15 251 lb (113.853 kg)  12/17/14 257 lb 12.8 oz (116.937 kg)  08/09/14 260 lb (117.935 kg)   Body mass index is 38.71 kg/(m^2).  Trouble falling asleep and staying asleep. 4-5 a night. Snores at night. No one noted apnea, but husband snores. Decrease in memory.  Pretty good energy. Has hot flashes.  Has no menses since ablation.  Review of Systems  Constitutional: Negative for fever and fatigue.  HENT: Negative for ear pain.  Eyes: Negative for pain.  Respiratory: Negative for chest tightness and shortness of breath.  Cardiovascular: Negative for chest pain, palpitations and leg swelling.  Gastrointestinal: Negative for abdominal pain.  Miralax daily to avoid constipation with past fissure.. Genitourinary: Negative for dysuria.       Objective:   Physical Exam  Constitutional: Vital signs are normal. She appears well-developed and well-nourished. She is cooperative. Non-toxic appearance. She does not appear ill. No distress.  obese  HENT:  Head: Normocephalic.  Right Ear: Hearing, tympanic membrane, external ear  and ear canal normal. Tympanic membrane is not erythematous, not retracted and not bulging.  Left Ear: Hearing, tympanic membrane, external ear and ear canal normal. Tympanic membrane is not erythematous, not retracted and not bulging.  Nose: No mucosal edema or rhinorrhea. Right sinus exhibits no maxillary sinus tenderness and no frontal sinus tenderness. Left sinus exhibits no maxillary sinus tenderness and no frontal sinus tenderness.  Mouth/Throat: Uvula is midline, oropharynx is clear and moist and mucous membranes are normal.  Eyes: Conjunctivae, EOM and lids are normal. Pupils are equal, round, and reactive to light. Lids are everted and swept, no foreign bodies found.  Neck: Trachea normal and normal range of motion. Neck supple. Carotid bruit is not present. No mass and no thyromegaly present.  Cardiovascular: Normal rate, regular rhythm, S1 normal, S2 normal, normal heart sounds, intact distal pulses and normal pulses. Exam reveals no gallop and no friction rub.  No murmur heard. Pulmonary/Chest: Effort normal and breath sounds normal. Not tachypneic. No respiratory distress. She has no decreased breath sounds. She has no wheezes. She has no rhonchi. She has no rales.  Abdominal: Soft. Normal appearance and bowel sounds are normal. There is no tenderness.  Neurological: She is alert.  Skin: Skin is warm, dry and intact. No rash noted.  Multiple skin tags around neck and axillae B and under breasts.  Psychiatric: Her speech is normal and behavior is normal. Judgment and thought content normal. Her mood appears not anxious. Cognition and memory are normal. She does not exhibit a depressed mood.          Assessment & Plan:  The patient's preventative  maintenance and recommended screening tests for an annual wellness exam were reviewed in full today. Brought up to date unless services declined.  Counselled on the importance of diet, exercise, and its role in overall health and  mortality. The patient's FH and SH was reviewed, including their home life, tobacco status, and drug and alcohol status.   Vaccines:Due for flu , refused, will get tdap today. Mammo/pap/ DVE: per GYN, Dr.A. Has upcoming appt with her. Blood in stool eval by GI Dr. Carlean Purl: From fisture 09/2013 nml colonoscopy, repeat in 10 years.  STD screen; refused former smoker, Quit 2003

## 2015-02-05 NOTE — Patient Instructions (Addendum)
Work on [employer] and start regular exercise. Decrease butter, cheese, cream, red meat. Stop at front desk to set up sleep study. Set up yearly appt with GYN.

## 2015-02-06 ENCOUNTER — Other Ambulatory Visit: Payer: Self-pay | Admitting: *Deleted

## 2015-02-06 MED ORDER — LOSARTAN POTASSIUM 50 MG PO TABS: 50.0000 mg | ORAL_TABLET | Freq: Every day | ORAL | Status: DC

## 2015-02-06 MED ORDER — SPIRONOLACTONE 50 MG PO TABS: ORAL_TABLET | ORAL | Status: DC

## 2015-02-15 ENCOUNTER — Institutional Professional Consult (permissible substitution): Payer: BC Managed Care – PPO | Admitting: Internal Medicine

## 2015-04-18 ENCOUNTER — Telehealth: Payer: Self-pay

## 2015-04-18 NOTE — Telephone Encounter (Signed)
Spoke with pt and ear is hurting badly and pt will go to [facility] ED now.

## 2015-04-18 NOTE — Telephone Encounter (Signed)
PLEASE NOTE: All timestamps contained within this report are represented as [place] Standard Time. CONFIDENTIALTY NOTICE: This fax transmission is intended only for the addressee. It contains information that is legally privileged, confidential or otherwise protected from use or disclosure. If you are not the intended recipient, you are strictly prohibited from reviewing, disclosing, copying using or disseminating any of this information or taking any action in reliance on or regarding this information. If you have received this fax in error, please notify us immediately by telephone so that we can arrange for its return to [place]. Phone: [phone], Toll-Free: [phone], Fax: [phone] Page: 1 of 1 Call Id: [id number] Duson Patient Name: [facility] Gender: Female DOB: 04-Feb-1966 Age: 49 Y 2 M 27 D Return Phone Number: Address: City/State/Zip: [place] Client [facility] - Client Client Site [place] - Day Physician Diona Browner, Amy Contact Type Call Caller Name Bristow Phone Number anon Relationship To Patient Self Is this call to report lab results? No Call Type General Information Initial Comment Caller stated she blew her nose and it sounded like her ear drum busted. She stated there is pain in her ear and she is not hearing good. Caller asked to speak with someone at the office to schedule an appointment for today. General Information Type Appointment Nurse Assessment Guidelines Guideline Title Affirmed Question Affirmed Notes Nurse Date/Time (Eastern Time) Disp. Time Eilene Ghazi Time) Disposition Final User 04/18/2015 4:26:10 PM General Information Provided Yes Hermine Messick After Care Instructions Given Call Event Type User Date / Time Description

## 2015-04-19 NOTE — Telephone Encounter (Signed)
Noted  

## 2015-08-28 ENCOUNTER — Ambulatory Visit (INDEPENDENT_AMBULATORY_CARE_PROVIDER_SITE_OTHER): Payer: BC Managed Care – PPO | Admitting: Family Medicine

## 2015-08-28 ENCOUNTER — Encounter: Payer: Self-pay | Admitting: Family Medicine

## 2015-08-28 VITALS — BP 112/60 | HR 109 | Temp 99.9°F | Ht 67.5 in | Wt 257.5 lb

## 2015-08-28 DIAGNOSIS — R509 Fever, unspecified: Secondary | ICD-10-CM | POA: Diagnosis not present

## 2015-08-28 DIAGNOSIS — J111 Influenza due to unidentified influenza virus with other respiratory manifestations: Secondary | ICD-10-CM | POA: Diagnosis not present

## 2015-08-28 LAB — POC INFLUENZA A&B (BINAX/QUICKVUE)
INFLUENZA A, POC: NEGATIVE
Influenza B, POC: NEGATIVE

## 2015-08-28 MED ORDER — OSELTAMIVIR PHOSPHATE 75 MG PO CAPS: 75.0000 mg | ORAL_CAPSULE | Freq: Two times a day (BID) | ORAL | Status: DC

## 2015-08-28 MED ORDER — HYDROCODONE-HOMATROPINE 5-1.5 MG/5ML PO SYRP: 5.0000 mL | ORAL_SOLUTION | Freq: Four times a day (QID) | ORAL | Status: DC | PRN

## 2015-08-28 NOTE — Assessment & Plan Note (Signed)
Flu swab is negative today but pt has classic symptoms and lives with a child that has the flu Handout given on influenza tamiflu 75 mg bid for 5 d - is in first 48 hours of symptoms Hycodan for cough with caution of sedation  Rest/fluids Disc symptomatic care - see instructions on AVS  Update if not starting to improve in a week or if worsening

## 2015-08-28 NOTE — Progress Notes (Signed)
Pre visit review using our clinic review tool, if applicable. No additional management support is needed unless otherwise documented below in the visit note. 

## 2015-08-28 NOTE — Patient Instructions (Addendum)
Drink lots of fluids  Rest when you can  Ibuprofen is ok for fever and body aches  Take the tamiflu as directed  Wear a mask when around people  Try the hycodan cough syrup with caution of sedation   Update if not starting to improve in a week or if worsening

## 2015-08-28 NOTE — Progress Notes (Signed)
Subjective:    Patient ID: Michele Hall. Meany, female    DOB: 28-Aug-1965, 50 y.o.   MRN: [id number]  HPI Here for uri /flu like symptoms   Started getting sick yesterday  Headache Chills and fever   Ears and face hurt (even teeth)  Pain is above and below eyes  Dry cough  Perhaps a bit tight Clois Comber Ears hurt   Does not get flu shot   Took advil  Temp is still 99.9  Unsure how high it was last night- does not have a thermometer   Grandson lives with her - he has the flu   Takes allegra daily flonase as needed- used it yesterday  Results for orders placed or performed in visit on 08/28/15  POC Influenza A&B(BINAX/QUICKVUE)  Result Value Ref Range   Influenza A, POC Negative Negative   Influenza B, POC Negative Negative     Patient Active Problem List   Diagnosis Date Noted  . Memory loss 02/05/2015  . Snoring 02/05/2015  . Allergic rhinitis 04/04/2014  . Anal fissure - posterior 09/06/2013  . Adenomyosis 08/03/2012  . Fibroids 08/03/2012  . ASCUS pap smear with negative high rish risk HPV 07/08/2012  . History of nephrolithiasis 10/27/2010  . Vitamin B 12 deficiency 07/18/2010  . Anemia, iron deficiency 07/18/2010  . RESTLESS LEG SYNDROME 05/07/2010  . Abnormal uterine bleeding 10/29/2006  . Hyperlipidemia 10/27/2006  . OBESITY 10/27/2006  . Essential hypertension, benign 10/27/2006  . GERD 10/27/2006  . IBS 10/27/2006   Past Medical History  Diagnosis Date  . Hypertension   . Anemia   . GERD (gastroesophageal reflux disease)   . Morbid obesity ([place])   . Kidney stones   . Gallstones   . Anal fissure - posterior 2015  . IBS    Past Surgical History  Procedure Laterality Date  . Cholecystectomy    . Tubal ligation    . Dilitation & currettage/hystroscopy with hydrothermal ablation N/A 09/13/2012    Procedure: DILATATION & CURETTAGE/HYSTEROSCOPY WITH HYDROTHERMAL ABLATION;  Surgeon: Osborne Oman, MD;  Location: [facility];  Service: Gynecology;   Laterality: N/A;   Social History  Substance Use Topics  . Smoking status: Former Smoker    Quit date: 02/09/2002  . Smokeless tobacco: Never Used  . Alcohol Use: No   Family History  Problem Relation Age of Onset  . Stroke Mother   . Kidney disease Father   . Coronary artery disease Father   . Aneurysm Father     AAA  . Hypertension Father   . Seizures Sister   . Heart attack Maternal Grandfather   . Cancer Sister     ovarian/uterus  . Tongue cancer Sister   . Colon cancer Neg Hx    Allergies  Allergen Reactions  . Requip [Ropinirole Hcl] Other (See Comments)    Headache Feels "sick"   Current Outpatient Prescriptions on File Prior to Visit  Medication Sig Dispense Refill  . cyclobenzaprine (FLEXERIL) 5 MG tablet Take 5 mg by mouth 3 (three) times daily as needed for muscle spasms.    . fluticasone (FLONASE) 50 MCG/ACT nasal spray Place 2 sprays into both nostrils daily. (Patient taking differently: Place 2 sprays into both nostrils daily as needed. ) 16 g 11  . losartan (COZAAR) 50 MG tablet Take 1 tablet (50 mg total) by mouth daily. 90 tablet 3  . oxyCODONE-acetaminophen (PERCOCET) 5-325 MG per tablet Take 2 tablets by mouth every 6 (six) hours as needed. [place]  tablet 0  . spironolactone (ALDACTONE) 50 MG tablet TAKE 1 TABLET (50 MG TOTAL) BY MOUTH DAILY. 90 tablet 3  . vitamin B-12 (CYANOCOBALAMIN) 500 MCG tablet Take 500 mcg by mouth daily.     No current facility-administered medications on file prior to visit.      Review of Systems  Constitutional: Positive for fever, appetite change and fatigue.  HENT: Positive for congestion, postnasal drip, rhinorrhea, sinus pressure, sneezing and sore throat. Negative for ear pain.   Eyes: Negative for pain and discharge.  Respiratory: Positive for cough. Negative for shortness of breath, wheezing and stridor.   Cardiovascular: Negative for chest pain.  Gastrointestinal: Negative for nausea, vomiting and diarrhea.    Genitourinary: Negative for urgency, frequency and hematuria.  Musculoskeletal: Negative for myalgias and arthralgias.  Skin: Negative for rash.  Neurological: Positive for headaches. Negative for dizziness, weakness and light-headedness.  Psychiatric/Behavioral: Negative for confusion and dysphoric mood.       Objective:   Physical Exam  Constitutional: She appears well-developed and well-nourished. No distress.  Obese and fatigued appearing   HENT:  Head: Normocephalic and atraumatic.  Right Ear: External ear normal.  Left Ear: External ear normal.  Mouth/Throat: Oropharynx is clear and moist.  Nares are injected and congested  Mild sinus tenderness  Clear rhinorrhea and post nasal drip    Eyes: Conjunctivae and EOM are normal. Pupils are equal, round, and reactive to light. Right eye exhibits no discharge. Left eye exhibits no discharge.  Neck: Normal range of motion. Neck supple.  Cardiovascular: Normal rate and normal heart sounds.   Pulmonary/Chest: Effort normal and breath sounds normal. No respiratory distress. She has no wheezes. She has no rales. She exhibits no tenderness.  Harsh/dry cough  Good air exch No wheeze even on forced expiration   Lymphadenopathy:    She has no cervical adenopathy.  Neurological: She is alert.  Skin: Skin is warm and dry. No rash noted.  Psychiatric: She has a normal mood and affect.          Assessment & Plan:   Problem List Items Addressed This Visit      Other   Influenza with respiratory manifestation    Flu swab is negative today but pt has classic symptoms and lives with a child that has the flu Handout given on influenza tamiflu 75 mg bid for 5 d - is in first 48 hours of symptoms Hycodan for cough with caution of sedation  Rest/fluids Disc symptomatic care - see instructions on AVS  Update if not starting to improve in a week or if worsening         Other Visit Diagnoses    Fever, unspecified fever cause    -   Primary    Relevant Orders    POC Influenza A&B(BINAX/QUICKVUE) (Completed)

## 2016-01-23 ENCOUNTER — Encounter: Payer: Self-pay | Admitting: Family Medicine

## 2016-01-23 ENCOUNTER — Ambulatory Visit (INDEPENDENT_AMBULATORY_CARE_PROVIDER_SITE_OTHER): Payer: BC Managed Care – PPO | Admitting: Family Medicine

## 2016-01-23 ENCOUNTER — Ambulatory Visit (INDEPENDENT_AMBULATORY_CARE_PROVIDER_SITE_OTHER)
Admission: RE | Admit: 2016-01-23 | Discharge: 2016-01-23 | Disposition: A | Discharge status: Home / Self Care | Payer: BC Managed Care – PPO | Source: Ambulatory Visit | Attending: Family Medicine | Admitting: Family Medicine

## 2016-01-23 VITALS — BP 104/70 | HR 83 | Temp 98.5°F | Ht 67.5 in | Wt 259.5 lb

## 2016-01-23 DIAGNOSIS — M5416 Radiculopathy, lumbar region: Secondary | ICD-10-CM

## 2016-01-23 DIAGNOSIS — M25552 Pain in left hip: Secondary | ICD-10-CM

## 2016-01-23 DIAGNOSIS — M5417 Radiculopathy, lumbosacral region: Secondary | ICD-10-CM

## 2016-01-23 LAB — POC URINALSYSI DIPSTICK (AUTOMATED)
Bilirubin, UA: NEGATIVE
Glucose, UA: NEGATIVE
Ketones, UA: NEGATIVE
Leukocytes, UA: NEGATIVE
NITRITE UA: NEGATIVE
PH UA: 6
Protein, UA: NEGATIVE
SPEC GRAV UA: 1.025
UROBILINOGEN UA: 0.2

## 2016-01-23 IMAGING — DX DG HIP (WITH OR WITHOUT PELVIS) 2-3V*L*
3 series · 3 of 3 positions shown · non-contrast
Comparison: None.

CLINICAL DATA: Low back pain, left hip pain

EXAM:
DG HIP (WITH OR WITHOUT PELVIS) 2-3V LEFT

[pelvis ap]
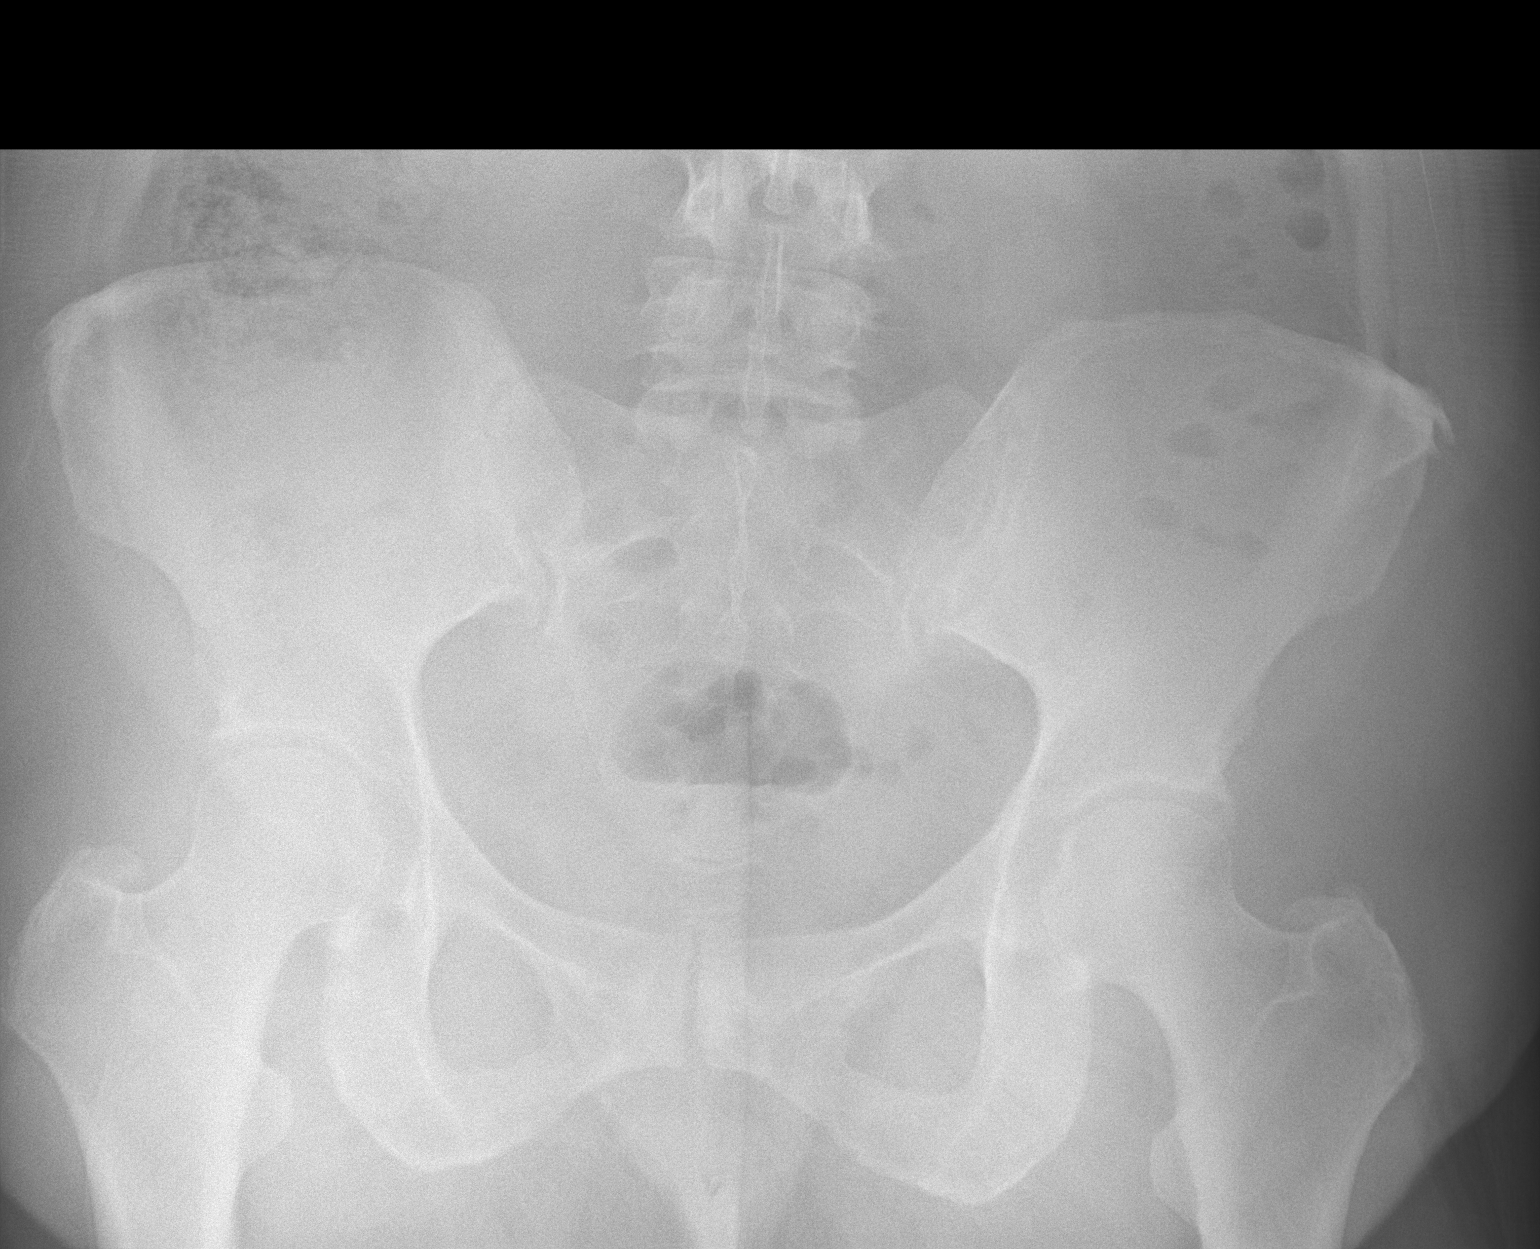

[hip ap]
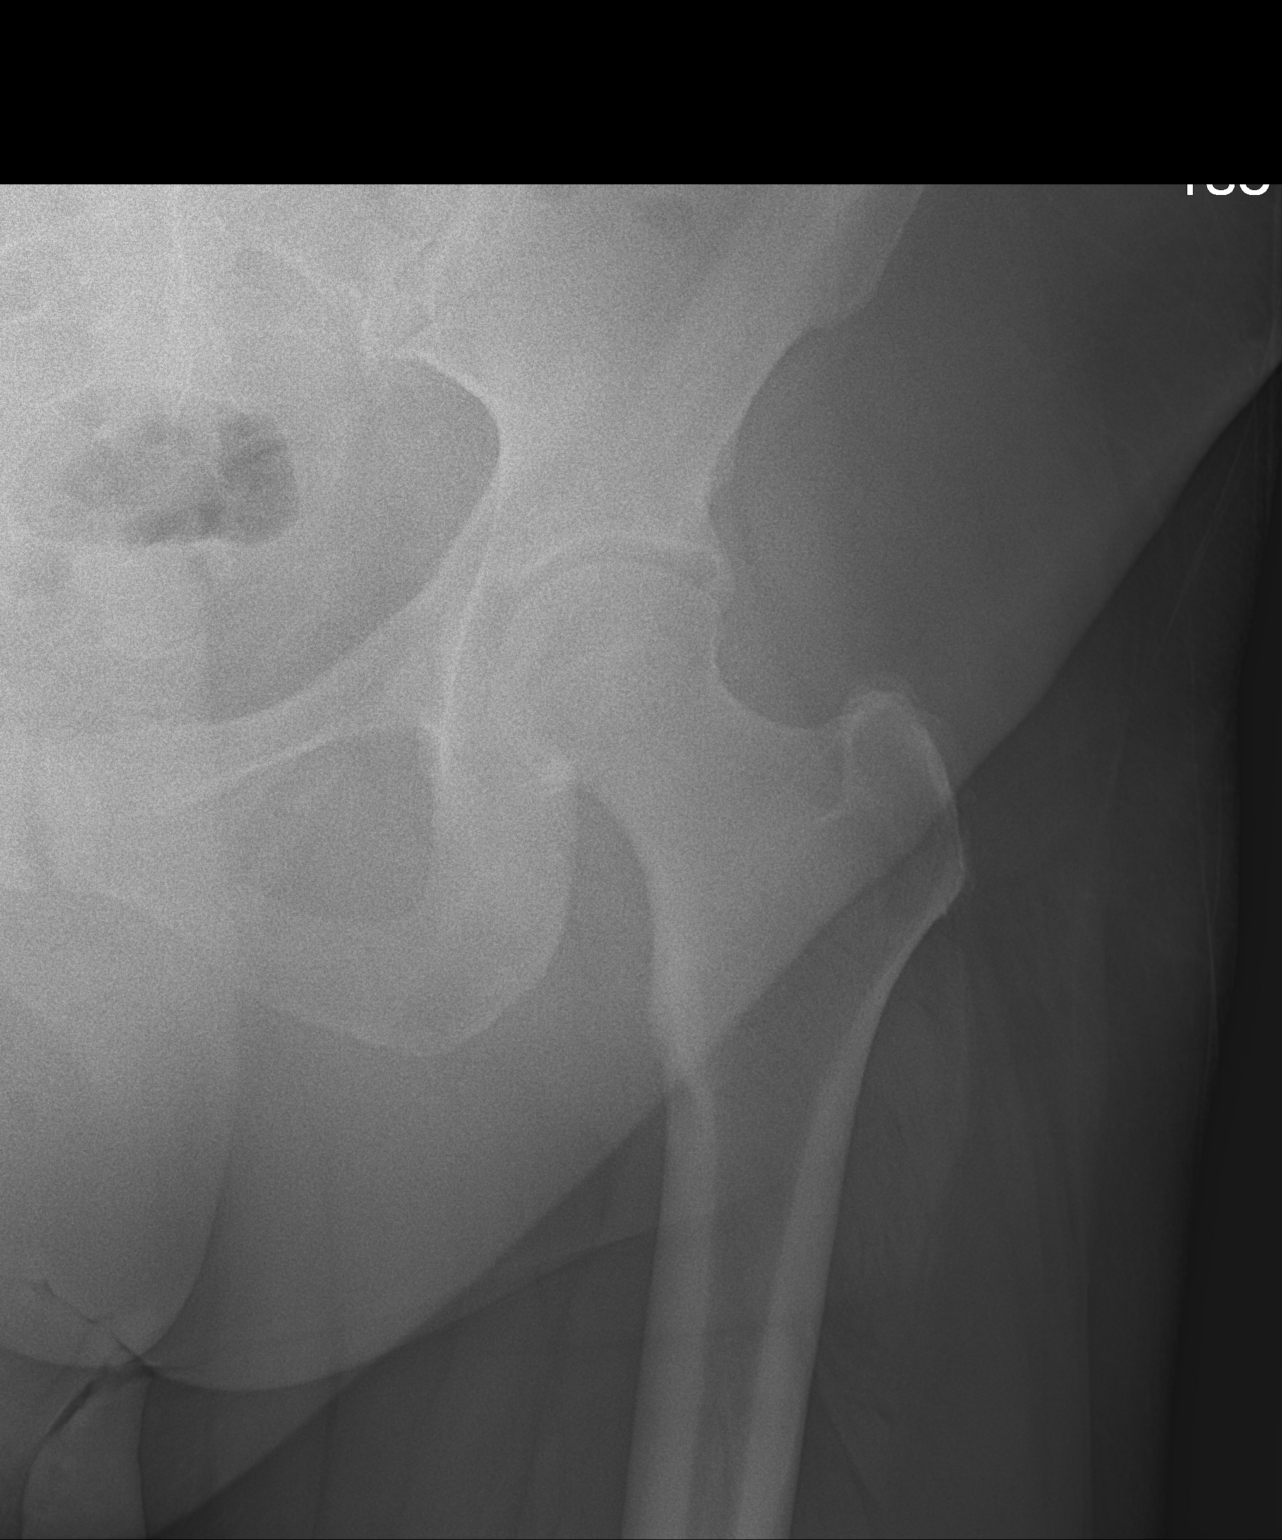

[hip lat]
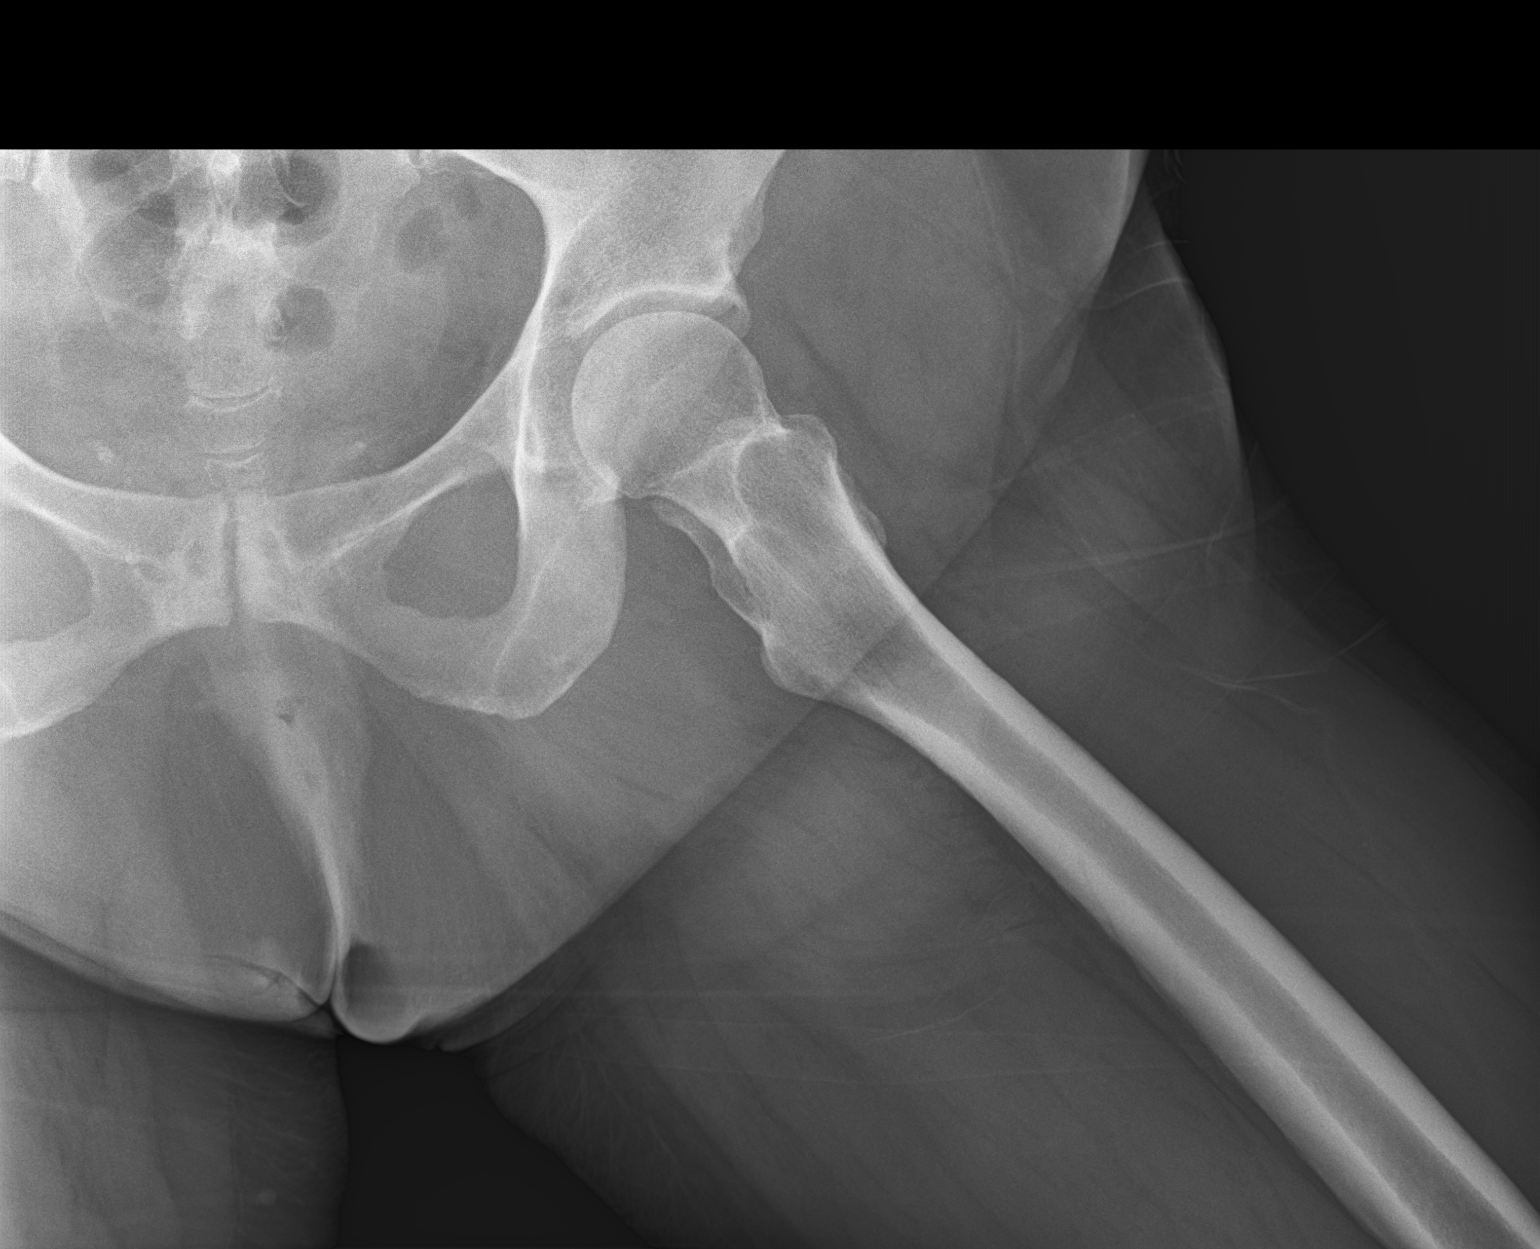

[3 of 3 positions shown; findings below may reference images not displayed]

FINDINGS: Three views of the left hip submitted. No acute fracture or
subluxation. Bilateral hip joints are symmetrical in appearance.
Mild degenerative changes pubic symphysis.
IMPRESSION: Negative.

## 2016-01-23 IMAGING — DX DG LUMBAR SPINE COMPLETE 4+V
5 series · 5 of 5 positions shown · non-contrast
Comparison: Coronal and sagittal CT images from a scan of [DATE]

CLINICAL DATA: Left low back pain with left-sided radicular
symptoms.

EXAM:
LUMBAR SPINE - COMPLETE 4+ VIEW

[l-spine ap]
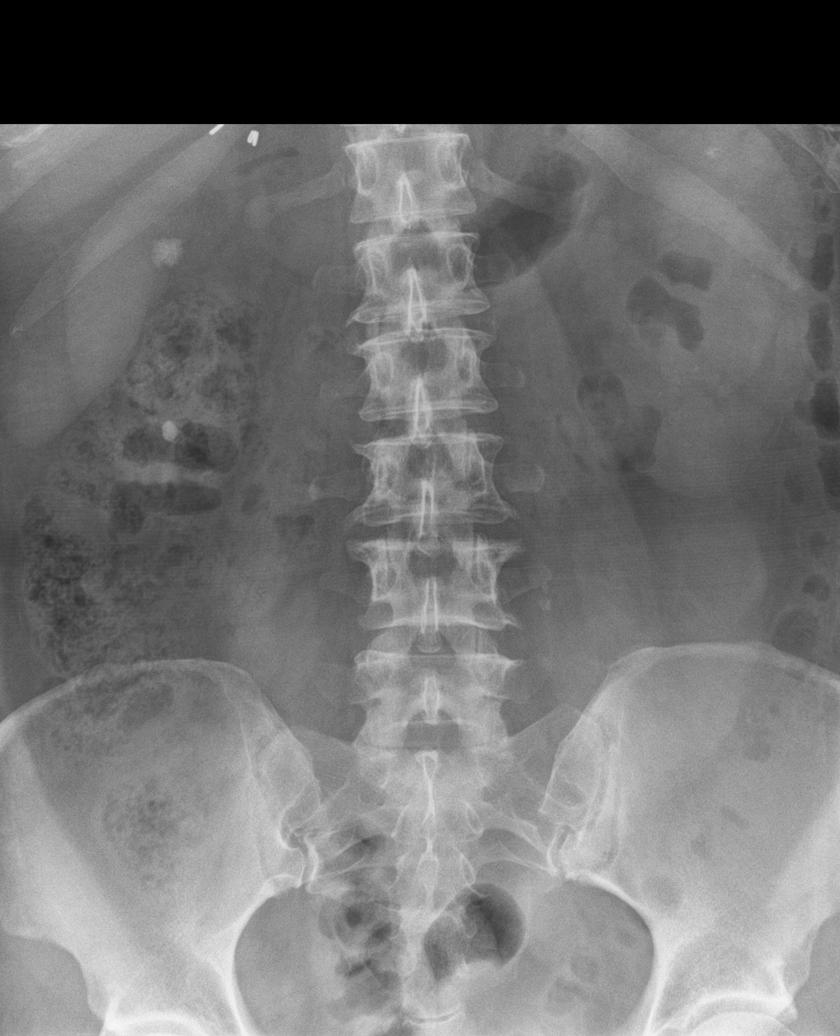

[l-spine obl (1 of 2)]
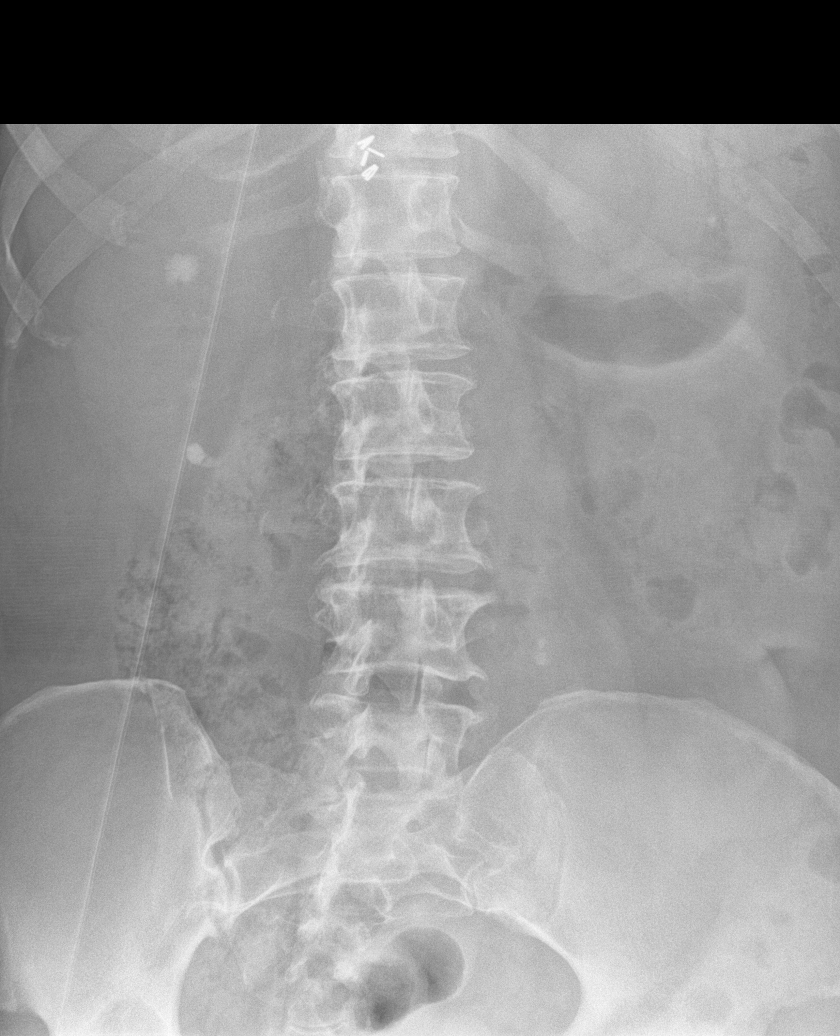

[l-spine obl (2 of 2)]
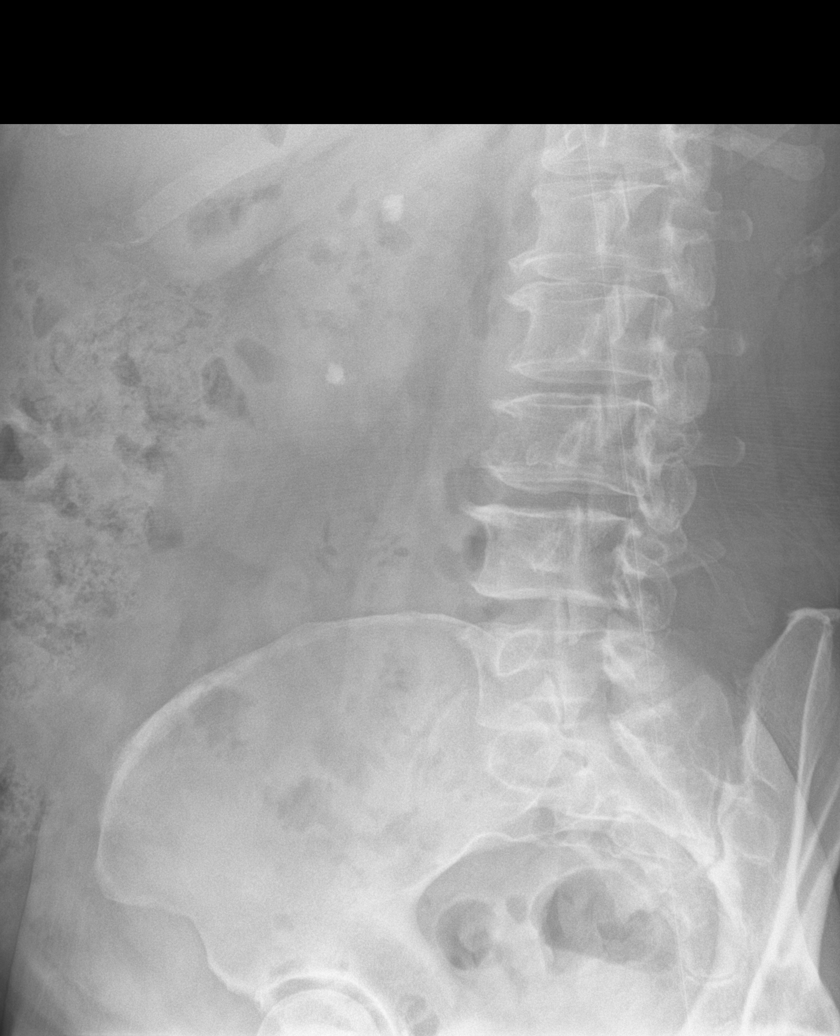

[l-spine lat]
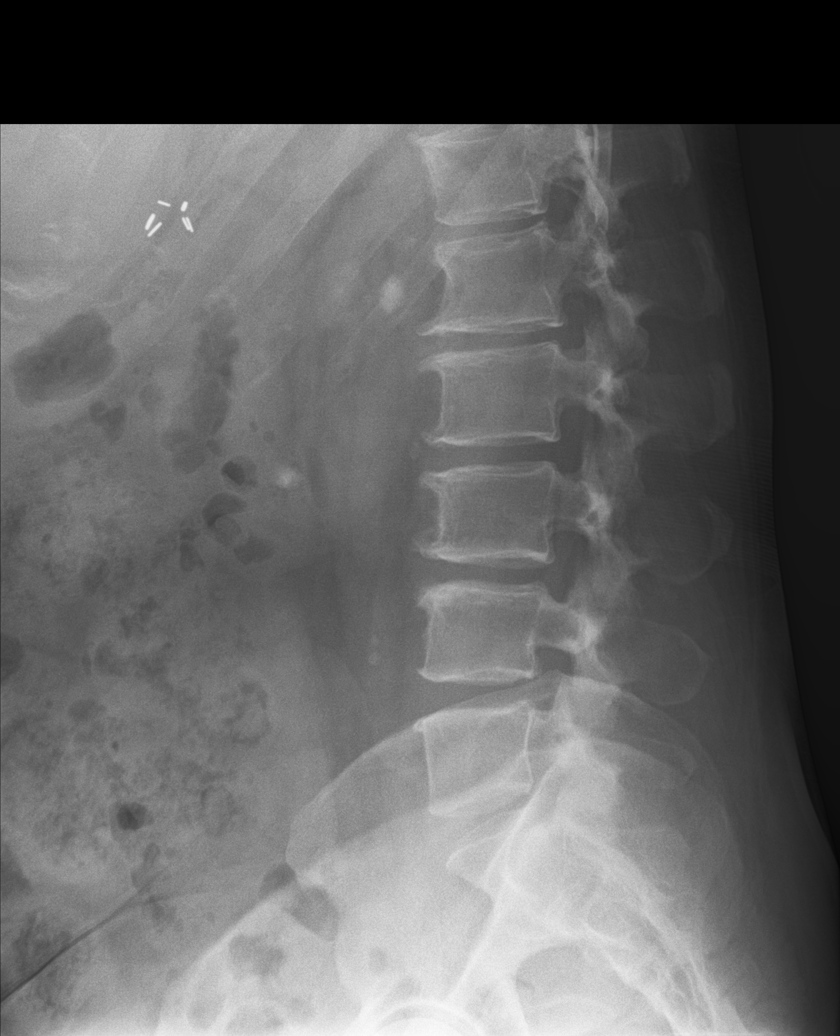

[l-spine l5/s1]
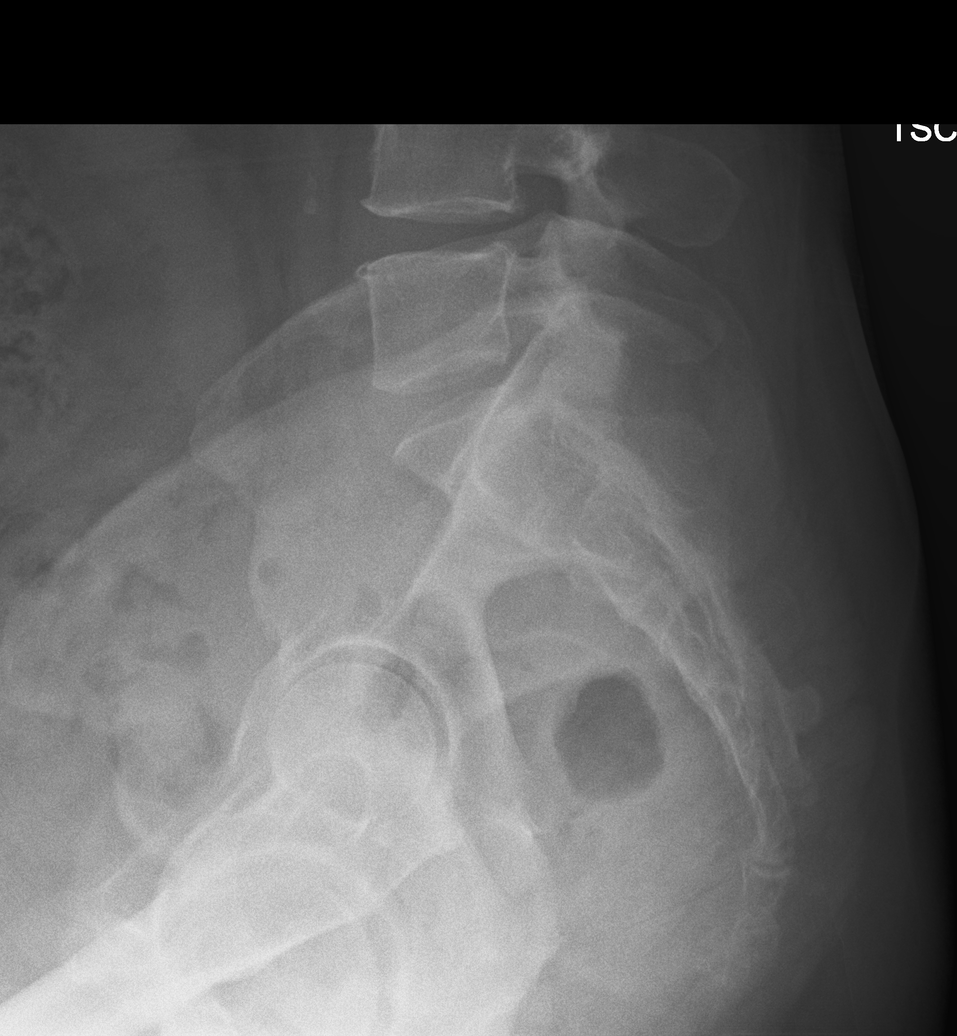

[5 of 5 positions shown; findings below may reference images not displayed]

FINDINGS: The lumbar vertebral bodies are preserved in height. The pedicles
and transverse processes are intact. The disc space heights are well
maintained. There are anterior endplate osteophytes at all lumbar
levels. There is no spondylolisthesis. The observed portions of the
sacrum are normal. There are bilateral kidney stones. There are 2
calcifications that project in the proximal left ureter with 1
measuring approximately 3 mm and other 5 mm in diameter. The bowel
gas pattern is unremarkable.
IMPRESSION: Multilevel endplate spurring of the lumbar spine without disc space
narrowing or spondylolisthesis. No compression fracture.

Bilateral kidney stones. There are 2 stones that project in the
proximal left ureter.

## 2016-01-23 MED ORDER — PREDNISONE 20 MG PO TABS: ORAL_TABLET | ORAL | 0 refills | Status: DC

## 2016-01-23 NOTE — Progress Notes (Signed)
Pre visit review using our clinic review tool, if applicable. No additional management support is needed unless otherwise documented below in the visit note. 

## 2016-01-23 NOTE — Progress Notes (Signed)
Dr. Frederico Hamman T. Offie Waide, MD, [facility] [place], [address] Phone: [phone] Fax: [phone]  01/23/2016  Patient: Michele Hall, MRN: [id number], DOB: 1965-12-14, 50 y.o.  Primary Physician:  Eliezer Lofts, MD   Chief Complaint  Patient presents with  . Hip Pain    Left  . Leg Pain    Left   Subjective:   Michele Hall is a 50 y.o. very pleasant female patient who presents with the following: Back Pain  ongoing for approximately: 3 weeks The patient has had back pain before. The back pain is localized into the lumbar spine area. They also describe no radiculopathy.  Pain in her buttocks  Just came on no injury. Nothin makes it better or worse. Across her buttocks . Runs down her left leg also. When walking, wants to scoot her leg.   Some pain in the buttocks area.   No numbness or tingling. No bowel or bladder incontinence. No focal weakness. Prior interventions: none Physical therapy: No Chiropractic manipulations: No Acupuncture: No Osteopathic manipulation: No Heat or cold: Minimal effect  Past Medical History, Surgical History, Family History, Medications, Allergies have been reviewed and updated if relevant.  Patient Active Problem List   Diagnosis Date Noted  . Influenza with respiratory manifestation 08/28/2015  . Memory loss 02/05/2015  . Snoring 02/05/2015  . Allergic rhinitis 04/04/2014  . Anal fissure - posterior 09/06/2013  . Adenomyosis 08/03/2012  . Fibroids 08/03/2012  . ASCUS pap smear with negative high rish risk HPV 07/08/2012  . History of nephrolithiasis 10/27/2010  . Vitamin B 12 deficiency 07/18/2010  . Anemia, iron deficiency 07/18/2010  . RESTLESS LEG SYNDROME 05/07/2010  . Abnormal uterine bleeding 10/29/2006  . Hyperlipidemia 10/27/2006  . OBESITY 10/27/2006  . Essential hypertension, benign 10/27/2006  . GERD 10/27/2006  . IBS 10/27/2006    Past Medical History:   Diagnosis Date  . Anal fissure - posterior 2015  . Anemia   . Gallstones   . GERD (gastroesophageal reflux disease)   . Hypertension   . IBS   . Kidney stones   . Morbid obesity ([place])     Past Surgical History:  Procedure Laterality Date  . CHOLECYSTECTOMY    . DILITATION & CURRETTAGE/HYSTROSCOPY WITH HYDROTHERMAL ABLATION N/A 09/13/2012   Procedure: DILATATION & CURETTAGE/HYSTEROSCOPY WITH HYDROTHERMAL ABLATION;  Surgeon: Osborne Oman, MD;  Location: [facility];  Service: Gynecology;  Laterality: N/A;  . TUBAL LIGATION      Social History   Social History  . Marital status: Married    Spouse name: N/A  . Number of children: 2  . Years of education: N/A   Occupational History  . house cleaner/ former bus driver Unemployed   Social History Main Topics  . Smoking status: Former Smoker    Quit date: 02/09/2002  . Smokeless tobacco: Never Used  . Alcohol use No  . Drug use: No  . Sexual activity: Yes    Partners: Male    Birth control/ protection: Surgical   Other Topics Concern  . Not on file   Social History Narrative   Married, 2 sons   Essentially no caffeine          Family History  Problem Relation Age of Onset  . Stroke Mother   . Kidney disease Father   . Coronary artery disease Father   . Aneurysm Father     AAA  . Hypertension Father   .  Seizures Sister   . Heart attack Maternal Grandfather   . Cancer Sister     ovarian/uterus  . Tongue cancer Sister   . Colon cancer Neg Hx     Allergies  Allergen Reactions  . Requip [Ropinirole Hcl] Other (See Comments)    Headache Feels "sick"    Medication list reviewed and updated in full in [facility].  GEN: No fevers, chills. Nontoxic. Primarily MSK c/o today. MSK: Detailed in the HPI GI: tolerating PO intake without difficulty Neuro: As above  Otherwise the pertinent positives of the ROS are noted above.    Objective:   Blood pressure 104/70, pulse 83, temperature 98.5 F (36.9 C),  temperature source Oral, height 5' 7.5" (1.715 m), weight 259 lb 8 oz (117.7 kg).  Gen: Well-developed,well-nourished,in no acute distress; alert,appropriate and cooperative throughout examination HEENT: Normocephalic and atraumatic without obvious abnormalities.  Ears, externally no deformities Pulm: Breathing comfortably in no respiratory distress Range of motion at  the waist: Flexion, rotation and lateral bending: minor limitations only  No echymosis or edema Rises to examination table with no difficulty Gait: minimally antalgic  Inspection/Deformity: No abnormality Paraspinus T:  l3-s1, L > R  B Ankle Dorsiflexion (L5,4): 5/5 B Great Toe Dorsiflexion (L5,4): 5/5 Heel Walk (L5): WNL Toe Walk (S1): WNL Rise/Squat (L4): WNL, mild pain  SENSORY B Medial Foot (L4): WNL B Dorsum (L5): WNL B Lateral (S1): WNL Light Touch: WNL Pinprick: WNL  REFLEXES Knee (L4): 2+ Ankle (S1): 2+  B SLR, seated: neg B SLR, supine: + on l B FABER: neg B Reverse FABER: neg B Greater Troch: NT B Log Roll: neg B Stork: NT B Sciatic Notch: NT  Radiology: Dg Lumbar Spine Complete  Result Date: 01/23/2016 CLINICAL DATA:  Left low back pain with left-sided radicular symptoms. EXAM: LUMBAR SPINE - COMPLETE 4+ VIEW COMPARISON:  Coronal and sagittal CT images from a scan of August 09, 2014 FINDINGS: The lumbar vertebral bodies are preserved in height. The pedicles and transverse processes are intact. The disc space heights are well maintained. There are anterior endplate osteophytes at all lumbar levels. There is no spondylolisthesis. The observed portions of the sacrum are normal. There are bilateral kidney stones. There are 2 calcifications that project in the proximal left ureter with 1 measuring approximately 3 mm and other 5 mm in diameter. The bowel gas pattern is unremarkable. IMPRESSION: Multilevel endplate spurring of the lumbar spine without disc space narrowing or spondylolisthesis. No  compression fracture. Bilateral kidney stones. There are 2 stones that project in the proximal left ureter. Electronically Signed   By: David  Martinique M.D.   On: 01/23/2016 14:50   Dg Hip Unilat With Pelvis 2-3 Views Left  Result Date: 01/23/2016 CLINICAL DATA:  Low back pain, left hip pain EXAM: DG HIP (WITH OR WITHOUT PELVIS) 2-3V LEFT COMPARISON:  None. FINDINGS: Three views of the left hip submitted. No acute fracture or subluxation. Bilateral hip joints are symmetrical in appearance. Mild degenerative changes pubic symphysis. IMPRESSION: Negative. Electronically Signed   By: Lahoma Crocker M.D.   On: 01/23/2016 14:41   Results for orders placed or performed in visit on 01/23/16  POCT Urinalysis Dipstick (Automated)  Result Value Ref Range   Color, UA yellow    Clarity, UA hazy    Glucose, UA negative    Bilirubin, UA negative    Ketones, UA negative    Spec Grav, UA 1.025    Blood, UA moderate  pH, UA 6.0    Protein, UA negative    Urobilinogen, UA 0.2    Nitrite, UA negative    Leukocytes, UA Negative Negative     Assessment and Plan:   Lumbar back pain with radiculopathy affecting left lower extremity - Plan: DG HIP UNILAT WITH PELVIS 2-3 VIEWS LEFT, DG Lumbar Spine Complete, POCT Urinalysis Dipstick (Automated)  Left hip pain - Plan: DG HIP UNILAT WITH PELVIS 2-3 VIEWS LEFT, DG Lumbar Spine Complete  Anatomy reviewed. Conservative algorithms for acute back pain generally begin with the following: NSAIDS, Muscle Relaxants, Mild pain medication  Start with medications, core rehab, and progress from there following low back pain algorithm. No red flags are present.  Flexeril at night  Follow-up: 3 weeks if not improving  New Prescriptions   PREDNISONE (DELTASONE) 20 MG TABLET    2 tabs po for 5 days, then 1 tab po for 5 days   Orders Placed This Encounter  Procedures  . DG HIP UNILAT WITH PELVIS 2-3 VIEWS LEFT  . DG Lumbar Spine Complete  . POCT Urinalysis Dipstick  (Automated)    Signed,  Franz Svec T. Selwyn Reason, MD   Patient's Medications  New Prescriptions   PREDNISONE (DELTASONE) 20 MG TABLET    2 tabs po for 5 days, then 1 tab po for 5 days  Previous Medications   CYCLOBENZAPRINE (FLEXERIL) 5 MG TABLET    Take 5 mg by mouth 3 (three) times daily as needed for muscle spasms.   FLUTICASONE (FLONASE) 50 MCG/ACT NASAL SPRAY    Place 1 spray into both nostrils daily as needed for allergies or rhinitis.   LOSARTAN (COZAAR) 50 MG TABLET    Take 1 tablet (50 mg total) by mouth daily.   OXYCODONE-ACETAMINOPHEN (PERCOCET) 5-325 MG PER TABLET    Take 2 tablets by mouth every 6 (six) hours as needed.   SPIRONOLACTONE (ALDACTONE) 50 MG TABLET    TAKE 1 TABLET (50 MG TOTAL) BY MOUTH DAILY.  Modified Medications   No medications on file  Discontinued Medications   FLUTICASONE (FLONASE) 50 MCG/ACT NASAL SPRAY    Place 2 sprays into both nostrils daily.   HYDROCODONE-HOMATROPINE (HYCODAN) 5-1.5 MG/5ML SYRUP    Take 5 mLs by mouth every 6 (six) hours as needed for cough (caution of sedation).   OSELTAMIVIR (TAMIFLU) 75 MG CAPSULE    Take 1 capsule (75 mg total) by mouth 2 (two) times daily.   VITAMIN B-12 (CYANOCOBALAMIN) 500 MCG TABLET    Take 500 mcg by mouth daily.

## 2016-02-04 ENCOUNTER — Telehealth: Payer: Self-pay | Admitting: Family Medicine

## 2016-02-04 NOTE — Telephone Encounter (Signed)
Please call and schedule CPE with fasting labs prior for Dr. Bedsole.  

## 2016-02-06 NOTE — Telephone Encounter (Signed)
Left message asking pt to call office  °

## 2016-02-06 NOTE — Telephone Encounter (Signed)
Lab 10/2 cpx 10/6 Pt aware

## 2016-03-09 ENCOUNTER — Telehealth: Payer: Self-pay | Admitting: Family Medicine

## 2016-03-09 ENCOUNTER — Other Ambulatory Visit: Payer: BC Managed Care – PPO

## 2016-03-09 DIAGNOSIS — E7800 Pure hypercholesterolemia, unspecified: Secondary | ICD-10-CM

## 2016-03-09 DIAGNOSIS — E538 Deficiency of other specified B group vitamins: Secondary | ICD-10-CM

## 2016-03-09 DIAGNOSIS — D509 Iron deficiency anemia, unspecified: Secondary | ICD-10-CM

## 2016-03-09 NOTE — Telephone Encounter (Signed)
-----   Message from Ellamae Sia sent at 03/03/2016  4:05 PM EDT ----- Regarding: Lab orders for Monday, 10.2.17 Patient is scheduled for CPX labs, please order future labs, Thanks , Karna Christmas

## 2016-03-13 ENCOUNTER — Encounter: Payer: BC Managed Care – PPO | Admitting: Family Medicine

## 2016-03-13 ENCOUNTER — Telehealth: Payer: Self-pay | Admitting: Family Medicine

## 2016-03-13 NOTE — Telephone Encounter (Signed)
-----   Message from Carter Kitten, [place] sent at 03/13/2016  2:27 PM EDT -----   ----- Message ----- From: Salvatore Marvel Sent: 03/13/2016   1:32 PM To: Carter Kitten, CMA  Pt called at 130pm today to cancel her 330 pm appt.  Can I cancel or leave on for $50 charge?  Ebony Hail

## 2016-03-26 ENCOUNTER — Other Ambulatory Visit: Payer: Self-pay | Admitting: Family Medicine

## 2016-04-10 ENCOUNTER — Telehealth: Payer: Self-pay | Admitting: Family Medicine

## 2016-04-10 DIAGNOSIS — I10 Essential (primary) hypertension: Secondary | ICD-10-CM

## 2016-04-10 DIAGNOSIS — E7800 Pure hypercholesterolemia, unspecified: Secondary | ICD-10-CM

## 2016-04-10 DIAGNOSIS — E538 Deficiency of other specified B group vitamins: Secondary | ICD-10-CM

## 2016-04-10 NOTE — Telephone Encounter (Signed)
-----   Message from Marchia Bond sent at 04/09/2016 11:26 AM EDT ----- Regarding: [name] Wed Nov 9, nedd orders. Thanks :-) Please order  future cpx labs for pt's upcoming lab appt. Thanks Aniceto Boss

## 2016-04-16 ENCOUNTER — Other Ambulatory Visit (INDEPENDENT_AMBULATORY_CARE_PROVIDER_SITE_OTHER): Payer: BC Managed Care – PPO

## 2016-04-16 DIAGNOSIS — E538 Deficiency of other specified B group vitamins: Secondary | ICD-10-CM | POA: Diagnosis not present

## 2016-04-16 DIAGNOSIS — D509 Iron deficiency anemia, unspecified: Secondary | ICD-10-CM

## 2016-04-16 DIAGNOSIS — E7800 Pure hypercholesterolemia, unspecified: Secondary | ICD-10-CM

## 2016-04-16 LAB — CBC WITH DIFFERENTIAL/PLATELET
BASOS PCT: 0.6 % (ref 0.0–3.0)
Basophils Absolute: 0 10*3/uL (ref 0.0–0.1)
EOS ABS: 0.2 10*3/uL (ref 0.0–0.7)
Eosinophils Relative: 3.4 % (ref 0.0–5.0)
HCT: 39.5 % (ref 36.0–46.0)
HEMOGLOBIN: 14 g/dL (ref 12.0–15.0)
Lymphocytes Relative: 29.9 % (ref 12.0–46.0)
Lymphs Abs: 1.9 10*3/uL (ref 0.7–4.0)
MCHC: 35.4 g/dL (ref 30.0–36.0)
MCV: 95.1 fl (ref 78.0–100.0)
Monocytes Absolute: 0.7 10*3/uL (ref 0.1–1.0)
Monocytes Relative: 11.1 % (ref 3.0–12.0)
Neutro Abs: 3.5 10*3/uL (ref 1.4–7.7)
Neutrophils Relative %: 55 % (ref 43.0–77.0)
PLATELETS: 229 10*3/uL (ref 150.0–400.0)
RBC: 4.15 Mil/uL (ref 3.87–5.11)
RDW: 12.7 % (ref 11.5–15.5)
WBC: 6.4 10*3/uL (ref 4.0–10.5)

## 2016-04-16 LAB — COMPREHENSIVE METABOLIC PANEL
ALT: 26 U/L (ref 0–35)
AST: 19 U/L (ref 0–37)
Albumin: 4.3 g/dL (ref 3.5–5.2)
Alkaline Phosphatase: 48 U/L (ref 39–117)
BUN: 16 mg/dL (ref 6–23)
CHLORIDE: 106 meq/L (ref 96–112)
CO2: 28 meq/L (ref 19–32)
CREATININE: 0.78 mg/dL (ref 0.40–1.20)
Calcium: 9.6 mg/dL (ref 8.4–10.5)
GFR: 83.35 mL/min (ref >60.00)
Glucose, Bld: 99 mg/dL (ref 70–99)
Potassium: 4.4 mEq/L (ref 3.5–5.1)
Sodium: 140 mEq/L (ref 135–145)
Total Bilirubin: 0.5 mg/dL (ref 0.2–1.2)
Total Protein: 7.5 g/dL (ref 6.0–8.3)

## 2016-04-16 LAB — LIPID PANEL
CHOL/HDL RATIO: 4
Cholesterol: 175 mg/dL (ref 0–200)
HDL: 40.5 mg/dL (ref >39.00)
LDL CALC: 95 mg/dL (ref 0–99)
NonHDL: 134.23
Triglycerides: 195 mg/dL — ABNORMAL HIGH (ref 0.0–149.0)
VLDL: 39 mg/dL (ref 0.0–40.0)

## 2016-04-16 LAB — VITAMIN B12: VITAMIN B 12: 190 pg/mL — AB (ref 211–911)

## 2016-04-23 ENCOUNTER — Encounter: Payer: Self-pay | Admitting: Family Medicine

## 2016-04-23 ENCOUNTER — Ambulatory Visit (INDEPENDENT_AMBULATORY_CARE_PROVIDER_SITE_OTHER): Payer: BC Managed Care – PPO | Admitting: Family Medicine

## 2016-04-23 VITALS — BP 100/64 | HR 93 | Temp 98.5°F | Ht 66.5 in | Wt 262.5 lb

## 2016-04-23 DIAGNOSIS — Z0000 Encounter for general adult medical examination without abnormal findings: Secondary | ICD-10-CM

## 2016-04-23 DIAGNOSIS — I10 Essential (primary) hypertension: Secondary | ICD-10-CM

## 2016-04-23 DIAGNOSIS — J302 Other seasonal allergic rhinitis: Secondary | ICD-10-CM

## 2016-04-23 DIAGNOSIS — E538 Deficiency of other specified B group vitamins: Secondary | ICD-10-CM

## 2016-04-23 DIAGNOSIS — I499 Cardiac arrhythmia, unspecified: Secondary | ICD-10-CM | POA: Insufficient documentation

## 2016-04-23 DIAGNOSIS — E7800 Pure hypercholesterolemia, unspecified: Secondary | ICD-10-CM

## 2016-04-23 NOTE — Patient Instructions (Addendum)
Call Dr. Loni Muse to set up repeat PAP smear ASAP.  Call to set up mammogram.  Start back zyrtec and flonase.  Start B complex vitamin or at least 1000 mg B12 daily Start exercise, work on low cholesterol diet, low fat and low carb.

## 2016-04-23 NOTE — Assessment & Plan Note (Addendum)
Hard to decriibe per pt. EKG showed: NSR no  Specific changes from 2014.  Pt will follow, decrease caffeine, decongestants and stress.Marland Kitchen She will call if becomes frequent.

## 2016-04-23 NOTE — Assessment & Plan Note (Signed)
Well controlled. Continue current medication.  

## 2016-04-23 NOTE — Progress Notes (Addendum)
Subjective:    Patient ID: Michele Hall. Shuart, female    DOB: September 22, 1965, 50 y.o.   MRN: [id number]  HPI The patient is here for annual wellness exam and preventative care.    She is using miralax daily for rectal fissure prevention. Eval'd in past by GI.   She also feels mucus in back of throat. Postnasal drip, occ sore throat.  Sneeze and runny nose. Occ using flonase. Tounge cancer in sister, MGM with tounge cancer.  Dentist eval's outh.  Hypertension:   Well controlled aldactone and lisinopril. BP Readings from Last 3 Encounters:  04/23/16 100/64  01/23/16 104/70  08/28/15 112/60  Using medication without problems or lightheadedness:  occ Chest pain with exertion:None occ , rarely feel feeling like blood going backward in heart.. Feels flushed and lightheadedness at times with it. no pain. Edema:None Short of breath: onoe Average home BPs: Other issues:  Elevated Cholesterol:  LDL at goal < 130 on no med. Trig remain elevated. Lab Results  Component Value Date   CHOL 175 04/16/2016   HDL 40.50 04/16/2016   LDLCALC 95 04/16/2016   LDLDIRECT 84.1 07/03/2013   TRIG 195.0 (H) 04/16/2016   CHOLHDL 4 04/16/2016  Body mass index is 41.73 kg/m. Using medications without problems: Muscle aches:  Diet compliance: Exercise: none Other complaints:   B12 low.. replete  Social History /Family History/Past Medical History reviewed and updated if needed.  Review of Systems  Constitutional: Positive for fatigue. Negative for fever.  HENT: Positive for congestion.   Eyes: Negative for pain.  Respiratory: Negative for cough and shortness of breath.   Cardiovascular: Negative for chest pain, palpitations and leg swelling.  Gastrointestinal: Negative for abdominal pain.  Genitourinary: Negative for dysuria and vaginal bleeding.  Musculoskeletal: Positive for back pain.  Neurological: Positive for light-headedness. Negative for syncope and headaches.  Psychiatric/Behavioral:  Negative for dysphoric mood.       Objective:   Physical Exam  Constitutional: Vital signs are normal. She appears well-developed and well-nourished. She is cooperative.  Non-toxic appearance. She does not appear ill. No distress.  morbid obesity  HENT:  Head: Normocephalic.  Right Ear: Hearing, tympanic membrane, external ear and ear canal normal. Tympanic membrane is not erythematous, not retracted and not bulging.  Left Ear: Hearing, tympanic membrane, external ear and ear canal normal. Tympanic membrane is not erythematous, not retracted and not bulging.  Nose: No mucosal edema or rhinorrhea. Right sinus exhibits no maxillary sinus tenderness and no frontal sinus tenderness. Left sinus exhibits no maxillary sinus tenderness and no frontal sinus tenderness.  Mouth/Throat: Uvula is midline, oropharynx is clear and moist and mucous membranes are normal.  Eyes: Conjunctivae, EOM and lids are normal. Pupils are equal, round, and reactive to light. Lids are everted and swept, no foreign bodies found.  Neck: Trachea normal and normal range of motion. Neck supple. Carotid bruit is not present. No thyroid mass and no thyromegaly present.  Cardiovascular: Normal rate, regular rhythm, S1 normal, S2 normal, normal heart sounds, intact distal pulses and normal pulses.  Exam reveals no gallop and no friction rub.   No murmur heard. Pulmonary/Chest: Effort normal and breath sounds normal. No tachypnea. No respiratory distress. She has no decreased breath sounds. She has no wheezes. She has no rhonchi. She has no rales.  Abdominal: Soft. Normal appearance and bowel sounds are normal. There is no tenderness.  Neurological: She is alert.  Skin: Skin is warm, dry and intact. No rash noted.  Psychiatric:  Her speech is normal and behavior is normal. Judgment and thought content normal. Her mood appears not anxious. Cognition and memory are normal. She does not exhibit a depressed mood.            Assessment & Plan:  The patient's preventative maintenance and recommended screening tests for an annual wellness exam were reviewed in full today. Brought up to date unless services declined.  Counselled on the importance of diet, exercise, and its role in overall health and mortality. The patient's FH and SH was reviewed, including their home life, tobacco status, and drug and alcohol status.     Vaccines: Uptodate withTdap, refused flu Pap/DVE:  Per Gyn, Dr. Harolyn Rutherford Mammo: per gyn, last mammo 2015 Bone Density: not indicated Colon:  GI Dr. Carlean Purl 09/2013 nml colonoscopy repeat in 2025 Smoking Status:former smoker QUIT 2003  HIV screen:   refused

## 2016-04-23 NOTE — Assessment & Plan Note (Signed)
Well controlled on no med. 

## 2016-04-23 NOTE — Progress Notes (Signed)
Pre visit review using our clinic review tool, if applicable. No additional management support is needed unless otherwise documented below in the visit note. 

## 2016-04-23 NOTE — Assessment & Plan Note (Signed)
Replete

## 2016-04-23 NOTE — Assessment & Plan Note (Signed)
Start allegra,  flonase.

## 2016-05-03 ENCOUNTER — Other Ambulatory Visit: Payer: Self-pay | Admitting: Family Medicine

## 2016-07-07 ENCOUNTER — Other Ambulatory Visit: Payer: Self-pay | Admitting: Family Medicine

## 2016-07-07 NOTE — Telephone Encounter (Signed)
Pt wanted to confirm that losartan would be filled today. Advised refill already sent to [facility]; pt will ck with pharmacy.

## 2016-10-08 ENCOUNTER — Encounter: Payer: Self-pay | Admitting: Internal Medicine

## 2016-10-08 ENCOUNTER — Ambulatory Visit (INDEPENDENT_AMBULATORY_CARE_PROVIDER_SITE_OTHER)
Admission: RE | Admit: 2016-10-08 | Discharge: 2016-10-08 | Disposition: A | Discharge status: Home / Self Care | Payer: BC Managed Care – PPO | Source: Ambulatory Visit | Attending: Internal Medicine | Admitting: Internal Medicine

## 2016-10-08 ENCOUNTER — Ambulatory Visit (INDEPENDENT_AMBULATORY_CARE_PROVIDER_SITE_OTHER): Payer: BC Managed Care – PPO | Admitting: Internal Medicine

## 2016-10-08 VITALS — BP 108/60 | HR 84 | Temp 97.9°F | Wt 268.0 lb

## 2016-10-08 DIAGNOSIS — M25561 Pain in right knee: Secondary | ICD-10-CM | POA: Diagnosis not present

## 2016-10-08 IMAGING — DX DG KNEE COMPLETE 4+V*R*
4 series · 4 of 4 positions shown · non-contrast
Comparison: None in PACs

CLINICAL DATA: Acute right knee pain

EXAM:
RIGHT KNEE - COMPLETE 4+ VIEW

[knee ap]
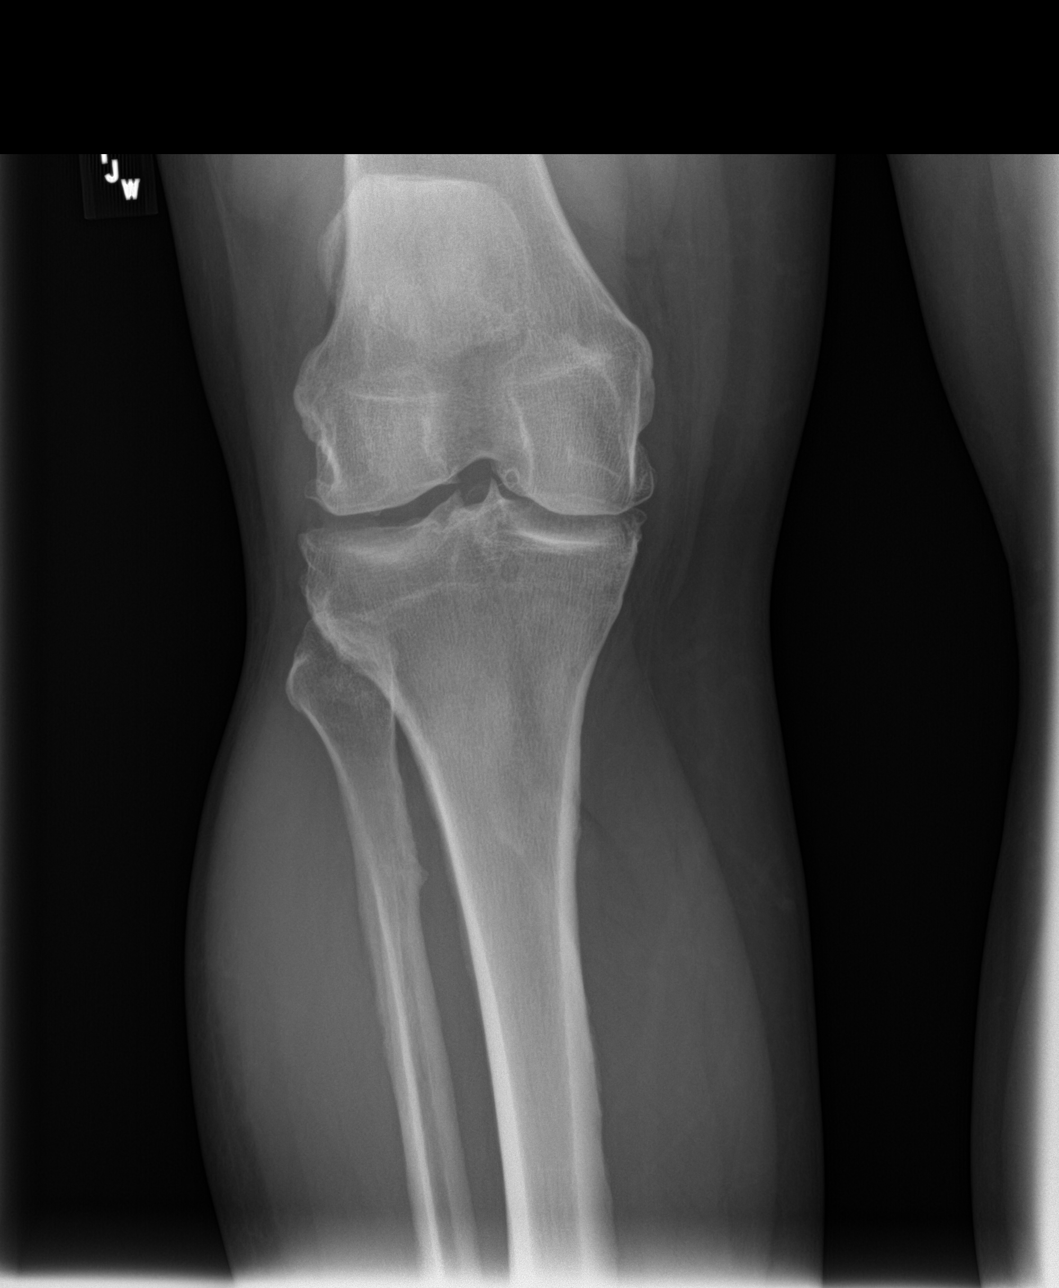

[knee lat]
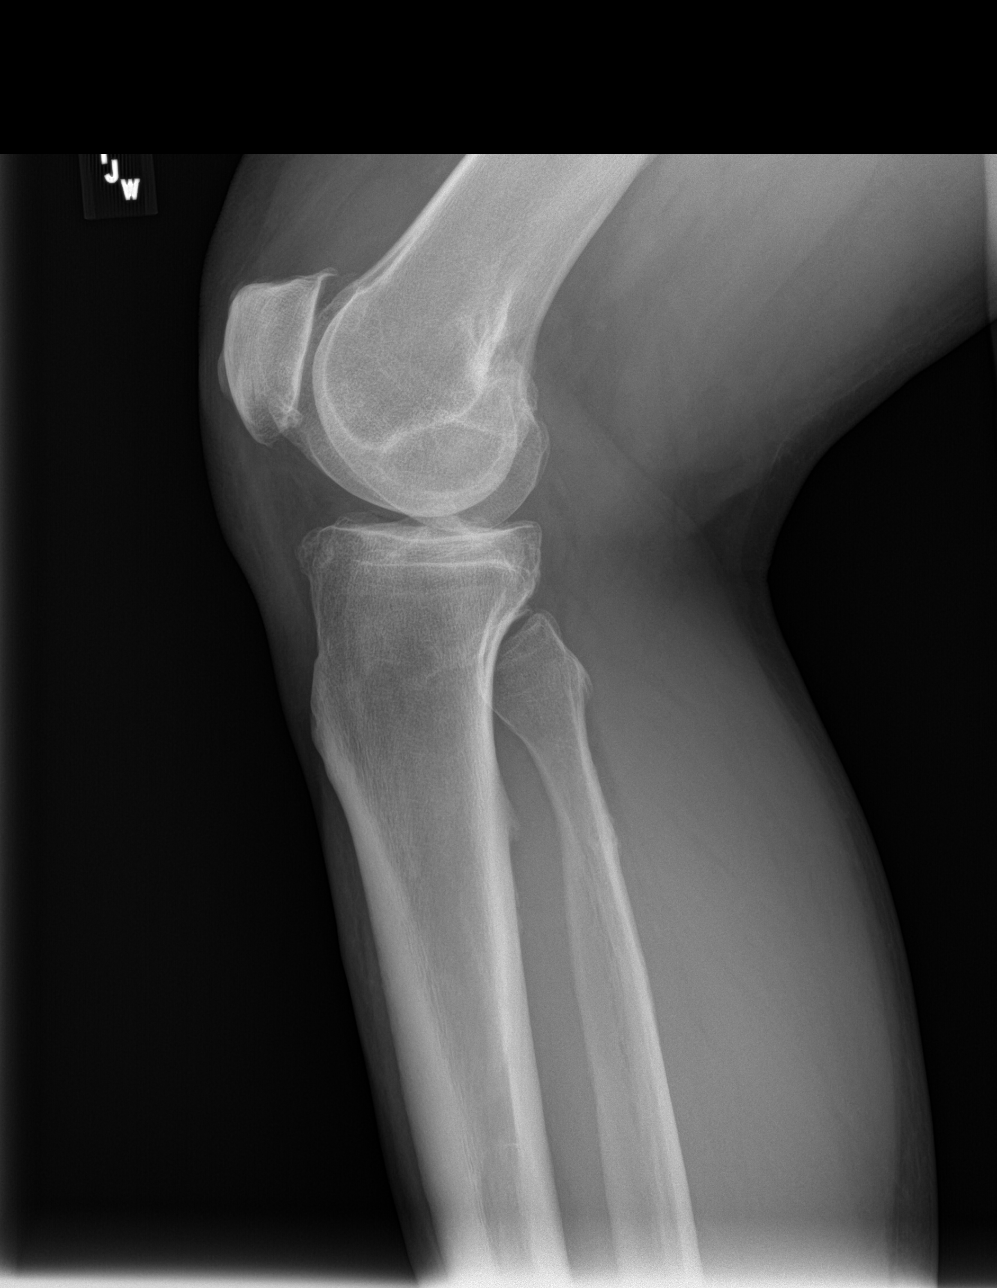

[patella skyline]
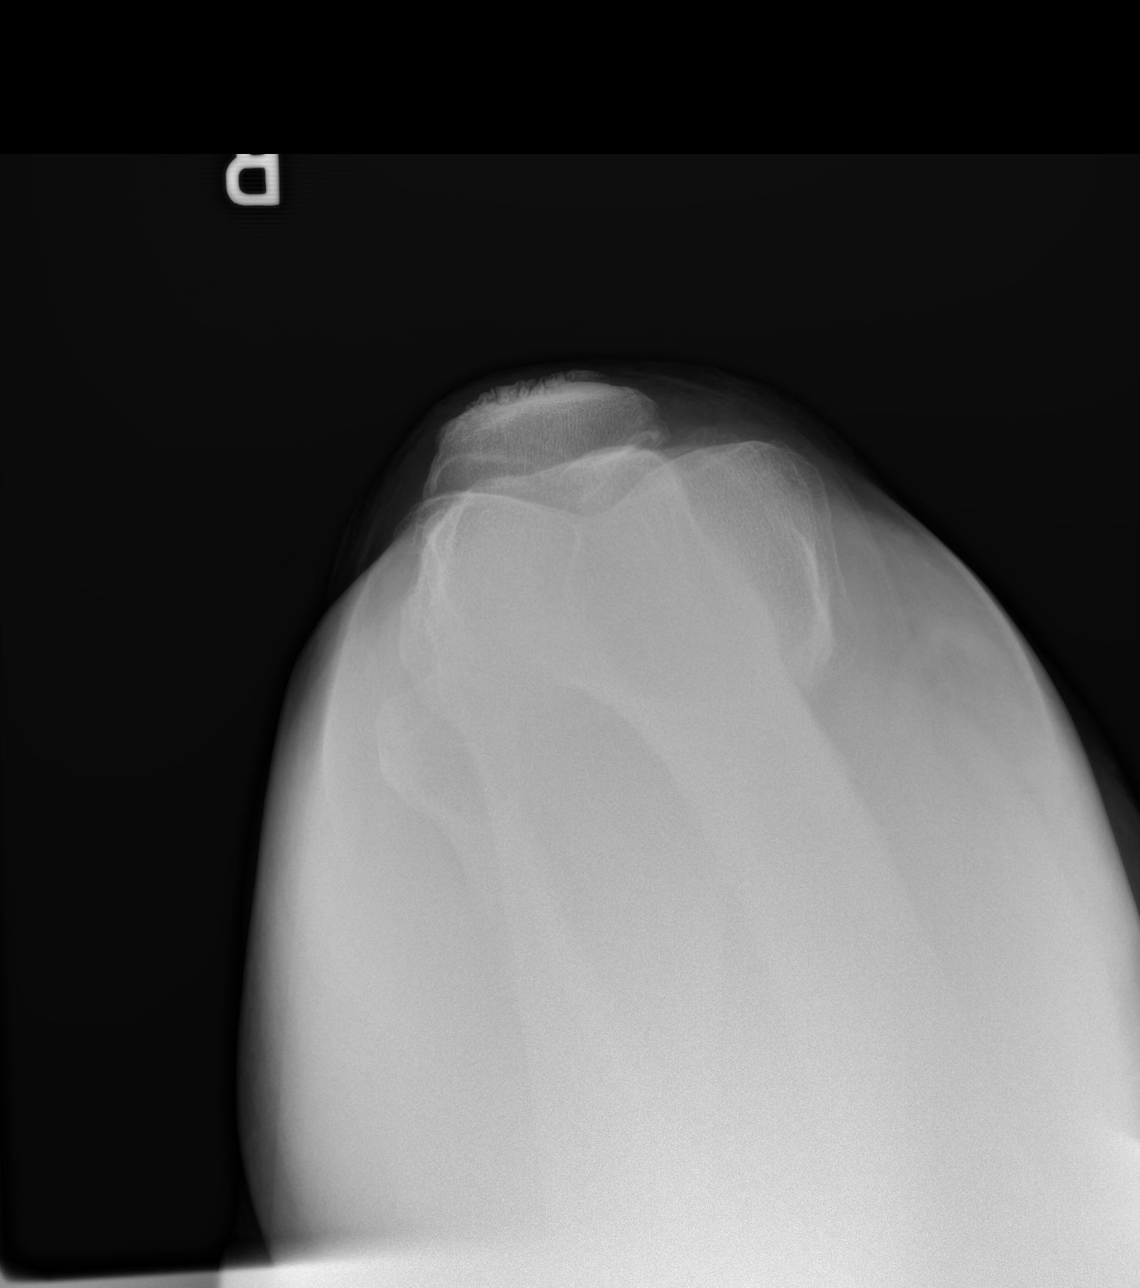

[knee obl]
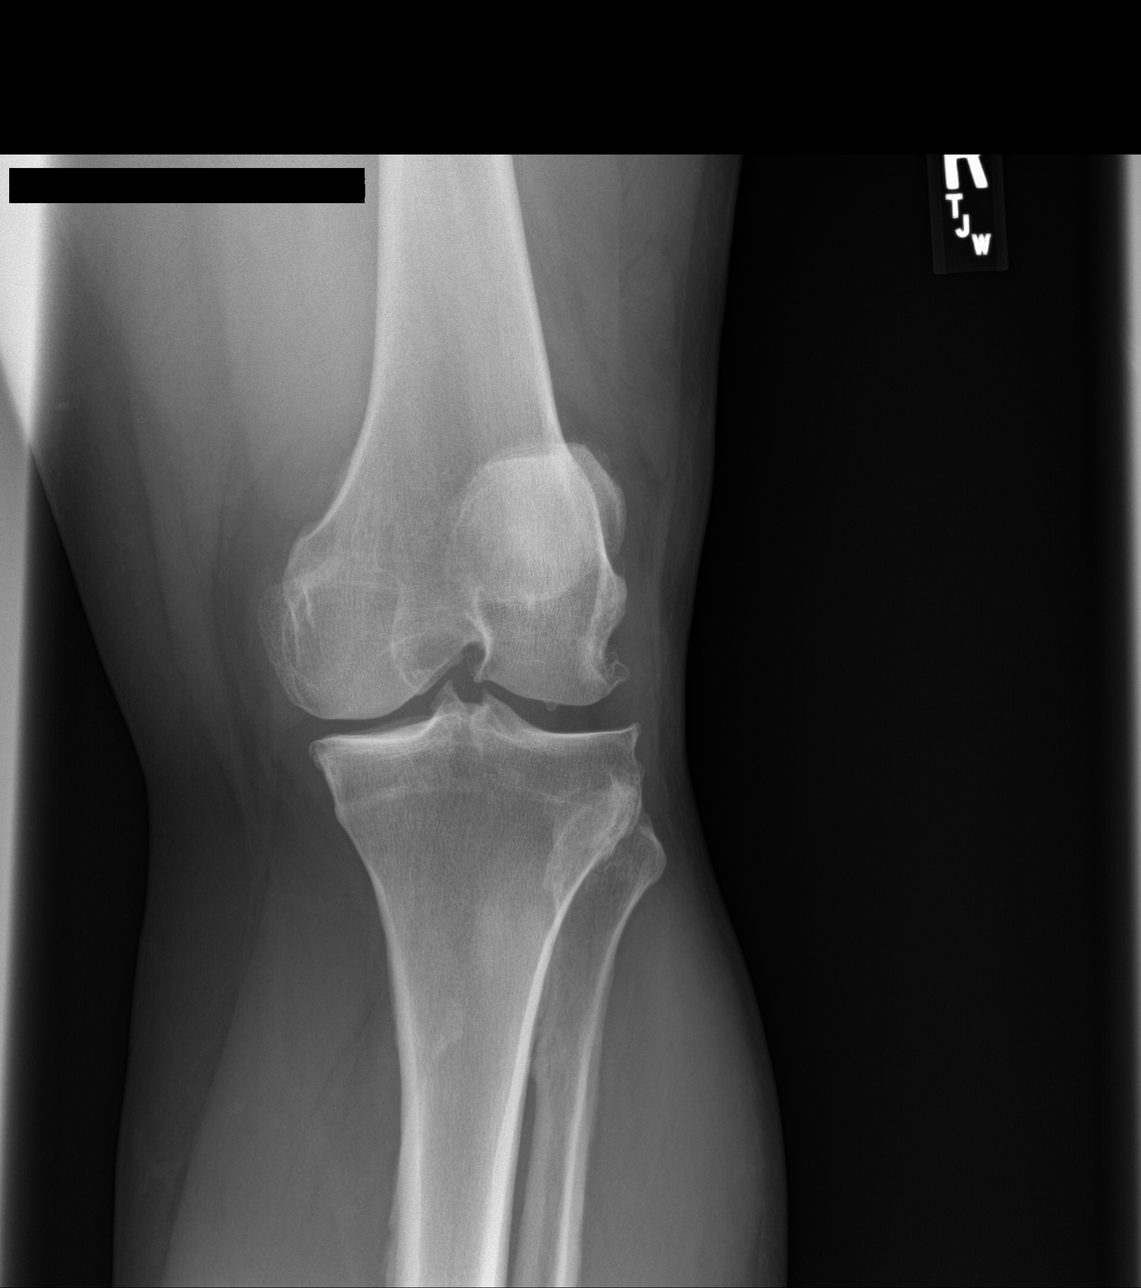

[4 of 4 positions shown; findings below may reference images not displayed]

FINDINGS: The bones are subjectively adequately mineralized. The joint spaces
are reasonably well-maintained. There is beaking of the tibial
spines. Small spurs arise from the articular margins of the tibial
plateaus and femoral condyles. There spurs arising from the
articular margins of the patella. Air is a small suprapatellar
effusion. There is no acute fracture.
IMPRESSION: Moderate tricompartmental osteoarthritic change manifested chiefly
by osteophyte formation. There is no significant joint space loss.
There is a small suprapatellar effusion.

## 2016-10-08 MED ORDER — NAPROXEN 500 MG PO TABS: 500.0000 mg | ORAL_TABLET | Freq: Two times a day (BID) | ORAL | 0 refills | Status: DC

## 2016-10-08 NOTE — Patient Instructions (Signed)
Knee Exercises Ask your health care provider which exercises are safe for you. Do exercises exactly as told by your health care provider and adjust them as directed. It is normal to feel mild stretching, pulling, tightness, or discomfort as you do these exercises, but you should stop right away if you feel sudden pain or your pain gets worse.Do not begin these exercises until told by your health care provider. STRETCHING AND RANGE OF MOTION EXERCISES  These exercises warm up your muscles and joints and improve the movement and flexibility of your knee. These exercises also help to relieve pain, numbness, and tingling. Exercise A: Knee Extension, Prone  1. Lie on your abdomen on a bed. 2. Place your left / right knee just beyond the edge of the surface so your knee is not on the bed. You can put a towel under your left / right thigh just above your knee for comfort. 3. Relax your leg muscles and allow gravity to straighten your knee. You should feel a stretch behind your left / right knee. 4. Hold this position for __________ seconds. 5. Scoot up so your knee is supported between repetitions. Repeat __________ times. Complete this stretch __________ times a day. Exercise B: Knee Flexion, Active   1. Lie on your back with both knees straight. If this causes back discomfort, bend your left / right knee so your foot is flat on the floor. 2. Slowly slide your left / right heel back toward your buttocks until you feel a gentle stretch in the front of your knee or thigh. 3. Hold this position for __________ seconds. 4. Slowly slide your left / right heel back to the starting position. Repeat __________ times. Complete this exercise __________ times a day. Exercise C: Quadriceps, Prone   1. Lie on your abdomen on a firm surface, such as a bed or padded floor. 2. Bend your left / right knee and hold your ankle. If you cannot reach your ankle or pant leg, loop a belt around your foot and grab the belt  instead. 3. Gently pull your heel toward your buttocks. Your knee should not slide out to the side. You should feel a stretch in the front of your thigh and knee. 4. Hold this position for __________ seconds. Repeat __________ times. Complete this stretch __________ times a day. Exercise D: Hamstring, Supine  1. Lie on your back. 2. Loop a belt or towel over the ball of your left / right foot. The ball of your foot is on the walking surface, right under your toes. 3. Straighten your left / right knee and slowly pull on the belt to raise your leg until you feel a gentle stretch behind your knee.  Do not let your left / right knee bend while you do this.  Keep your other leg flat on the floor. 4. Hold this position for __________ seconds. Repeat __________ times. Complete this stretch __________ times a day. STRENGTHENING EXERCISES  These exercises build strength and endurance in your knee. Endurance is the ability to use your muscles for a long time, even after they get tired. Exercise E: Quadriceps, Isometric   1. Lie on your back with your left / right leg extended and your other knee bent. Put a rolled towel or small pillow under your knee if told by your health care provider. 2. Slowly tense the muscles in the front of your left / right thigh. You should see your kneecap slide up toward your hip or see  increased dimpling just above the knee. This motion will push the back of the knee toward the floor. 3. For __________ seconds, keep the muscle as tight as you can without increasing your pain. 4. Relax the muscles slowly and completely. Repeat __________ times. Complete this exercise __________ times a day. Exercise F: Straight Leg Raises - Quadriceps  1. Lie on your back with your left / right leg extended and your other knee bent. 2. Tense the muscles in the front of your left / right thigh. You should see your kneecap slide up or see increased dimpling just above the knee. Your thigh  may even shake a bit. 3. Keep these muscles tight as you raise your leg 4-6 inches (10-15 cm) off the floor. Do not let your knee bend. 4. Hold this position for __________ seconds. 5. Keep these muscles tense as you lower your leg. 6. Relax your muscles slowly and completely after each repetition. Repeat __________ times. Complete this exercise __________ times a day. Exercise G: Hamstring, Isometric  1. Lie on your back on a firm surface. 2. Bend your left / right knee approximately __________ degrees. 3. Dig your left / right heel into the surface as if you are trying to pull it toward your buttocks. Tighten the muscles in the back of your thighs to dig as hard as you can without increasing any pain. 4. Hold this position for __________ seconds. 5. Release the tension gradually and allow your muscles to relax completely for __________ seconds after each repetition. Repeat __________ times. Complete this exercise __________ times a day. Exercise H: Hamstring Curls   If told by your health care provider, do this exercise while wearing ankle weights. Begin with __________ weights. Then increase the weight by 1 lb (0.5 kg) increments. Do not wear ankle weights that are more than __________. 1. Lie on your abdomen with your legs straight. 2. Bend your left / right knee as far as you can without feeling pain. Keep your hips flat against the floor. 3. Hold this position for __________ seconds. 4. Slowly lower your leg to the starting position. Repeat __________ times. Complete this exercise __________ times a day. Exercise I: Squats (Quadriceps)  1. Stand in front of a table, with your feet and knees pointing straight ahead. You may rest your hands on the table for balance but not for support. 2. Slowly bend your knees and lower your hips like you are going to sit in a chair.  Keep your weight over your heels, not over your toes.  Keep your lower legs upright so they are parallel with the  table legs.  Do not let your hips go lower than your knees.  Do not bend lower than told by your health care provider.  If your knee pain increases, do not bend as low. 3. Hold the squat position for __________ seconds. 4. Slowly push with your legs to return to standing. Do not use your hands to pull yourself to standing. Repeat __________ times. Complete this exercise __________ times a day. Exercise J: Wall Slides (Quadriceps)   1. Lean your back against a smooth wall or door while you walk your feet out 18-24 inches (46-61 cm) from it. 2. Place your feet hip-width apart. 3. Slowly slide down the wall or door until your knees Repeat __________ times. Complete this exercise __________ times a day. 4. Exercise K: Straight Leg Raises - Hip Abductors  1. Lie on your side with your left / right leg   in the top position. Lie so your head, shoulder, knee, and hip line up. You may bend your bottom knee to help you keep your balance. 2. Roll your hips slightly forward so your hips are stacked directly over each other and your left / right knee is facing forward. 3. Leading with your heel, lift your top leg 4-6 inches (10-15 cm). You should feel the muscles in your outer hip lifting.  Do not let your foot drift forward.  Do not let your knee roll toward the ceiling. 4. Hold this position for __________ seconds. 5. Slowly return your leg to the starting position. 6. Let your muscles relax completely after each repetition. Repeat __________ times. Complete this exercise __________ times a day. Exercise L: Straight Leg Raises - Hip Extensors  1. Lie on your abdomen on a firm surface. You can put a pillow under your hips if that is more comfortable. 2. Tense the muscles in your buttocks and lift your left / right leg about 4-6 inches (10-15 cm). Keep your knee straight as you lift your leg. 3. Hold this position for __________ seconds. 4. Slowly lower your leg to the starting position. 5. Let  your leg relax completely after each repetition. Repeat __________ times. Complete this exercise __________ times a day. This information is not intended to replace advice given to you by your health care provider. Make sure you discuss any questions you have with your health care provider. Document Released: 04/08/2005 Document Revised: 02/17/2016 Document Reviewed: 03/31/2015 Elsevier Interactive Patient Education  2017 [employer]  

## 2016-10-08 NOTE — Progress Notes (Signed)
Subjective:    Patient ID: Michele Hall. Pidgeon, female    DOB: 07-29-1965, 51 y.o.   MRN: [id number]  HPI  Pt presents to the clinic today with c/o right knee pain. This started 1-2 weeks ago. She describes the pain as sharp and stabbing. The pain does not radiate. She denies numbness or tingling in her legs. The pain is worse with weight bearing and walking down stairs. It also seems worse with extending her leg. She denies bruising or swelling of the area. She has tried Ibuprofen and ASA with minimal relief. She denies any injury to the area.   Review of Systems  Past Medical History:  Diagnosis Date  . Anal fissure - posterior 2015  . Anemia   . Gallstones   . GERD (gastroesophageal reflux disease)   . Hypertension   . IBS   . Kidney stones   . Morbid obesity ([name])     Current Outpatient Prescriptions  Medication Sig Dispense Refill  . losartan (COZAAR) 50 MG tablet TAKE 1 TABLET BY MOUTH EVERY DAY 90 tablet 1  . spironolactone (ALDACTONE) 50 MG tablet TAKE 1 TABLET BY MOUTH EVERY DAY 90 tablet 3  . fluticasone (FLONASE) 50 MCG/ACT nasal spray Place 1 spray into both nostrils daily as needed for allergies or rhinitis.     No current facility-administered medications for this visit.     Allergies  Allergen Reactions  . Requip [Ropinirole Hcl] Other (See Comments)    Headache Feels "sick"    Family History  Problem Relation Age of Onset  . Stroke Mother   . Kidney disease Father   . Coronary artery disease Father   . Aneurysm Father     AAA  . Hypertension Father   . Seizures Sister   . Heart attack Maternal Grandfather   . Cancer Sister     ovarian/uterus  . Tongue cancer Sister   . Colon cancer Neg Hx     Social History   Social History  . Marital status: Married    Spouse name: N/A  . Number of children: 2  . Years of education: N/A   Occupational History  . house cleaner/ former bus driver Unemployed   Social History Main Topics  . Smoking status:  Former Smoker    Quit date: 02/09/2002  . Smokeless tobacco: Never Used  . Alcohol use No  . Drug use: No  . Sexual activity: Yes    Partners: Male    Birth control/ protection: Surgical   Other Topics Concern  . Not on file   Social History Narrative   Married, 2 sons   Essentially no caffeine           Constitutional: Denies fever, malaise, fatigue, headache or abrupt weight changes.  Musculoskeletal: Pt reports right knee pain. Denies difficulty with gait, muscle pain or joint swelling.  Skin: Denies redness, rashes, lesions or ulcercations.    No other specific complaints in a complete review of systems (except as listed in HPI above).     Objective:   Physical Exam  BP 108/60   Pulse 84   Temp 97.9 F (36.6 C) (Oral)   Wt 268 lb (121.6 kg)   SpO2 97%   BMI 42.61 kg/m  Wt Readings from Last 3 Encounters:  10/08/16 268 lb (121.6 kg)  04/23/16 262 lb 8 oz (119.1 kg)  01/23/16 259 lb 8 oz (117.7 kg)    General: Appears her stated age, obese in NAD. Skin: Warm,  dry and intact. No redness or warmth noted of the right knee.  Musculoskeletal: Normal flexion of the right knee. Pain with extension. Crepitus noted with ROM. No joint swelling noted. Pain with palpation of the lateral pes bursa and joint line. She is limping when she walks. She is able to walk on toes. Pain with walking on heels. Negative Lachman's.  BMET    Component Value Date/Time   NA 140 04/16/2016 0904   K 4.4 04/16/2016 0904   CL 106 04/16/2016 0904   CO2 28 04/16/2016 0904   GLUCOSE 99 04/16/2016 0904   BUN 16 04/16/2016 0904   CREATININE 0.78 04/16/2016 0904   CALCIUM 9.6 04/16/2016 0904   GFRNONAA 76 (L) 08/09/2014 0852   GFRAA 88 (L) 08/09/2014 0852    Lipid Panel     Component Value Date/Time   CHOL 175 04/16/2016 0904   TRIG 195.0 (H) 04/16/2016 0904   HDL 40.50 04/16/2016 0904   CHOLHDL 4 04/16/2016 0904   VLDL 39.0 04/16/2016 0904   LDLCALC 95 04/16/2016 0904    CBC      Component Value Date/Time   WBC 6.4 04/16/2016 0904   RBC 4.15 04/16/2016 0904   HGB 14.0 04/16/2016 0904   HCT 39.5 04/16/2016 0904   PLT 229.0 04/16/2016 0904   MCV 95.1 04/16/2016 0904   MCH 33.3 08/09/2014 0852   MCHC 35.4 04/16/2016 0904   RDW 12.7 04/16/2016 0904   LYMPHSABS 1.9 04/16/2016 0904   MONOABS 0.7 04/16/2016 0904   EOSABS 0.2 04/16/2016 0904   BASOSABS 0.0 04/16/2016 0904    Hgb A1C No results found for: HGBA1C          Assessment & Plan:   Right Knee Pain, Acute:  Discussed conservative treatment first Ice may be beneficial eRx for Naproxen 500 mg BID with food She is insistent on xray today- ordered per her request Knee exercises given   Will follow up after xray is back, RTC as needed or if symptoms persist or worsen Zacharias Ridling, NP

## 2017-01-01 ENCOUNTER — Other Ambulatory Visit: Payer: Self-pay | Admitting: Family Medicine

## 2017-03-01 ENCOUNTER — Telehealth: Payer: Self-pay | Admitting: Family Medicine

## 2017-03-01 DIAGNOSIS — M25561 Pain in right knee: Principal | ICD-10-CM

## 2017-03-01 DIAGNOSIS — G8929 Other chronic pain: Secondary | ICD-10-CM

## 2017-03-01 NOTE — Telephone Encounter (Signed)
Caller Name:Gisela Cacho  Relationship to Patient:self Best [phone] Pharmacy:  Reason for call: pt is having worsening pain, esp in back of leg.  She is asking for ortho referral. Prefers either [facility] or [facility], whichever has first availability. Pt is asking for urgent referral.

## 2017-03-02 ENCOUNTER — Ambulatory Visit (INDEPENDENT_AMBULATORY_CARE_PROVIDER_SITE_OTHER): Payer: BC Managed Care – PPO | Admitting: Internal Medicine

## 2017-03-02 ENCOUNTER — Encounter: Payer: Self-pay | Admitting: Internal Medicine

## 2017-03-02 VITALS — BP 106/78 | HR 94 | Temp 98.2°F | Wt 272.0 lb

## 2017-03-02 DIAGNOSIS — M25561 Pain in right knee: Secondary | ICD-10-CM | POA: Diagnosis not present

## 2017-03-02 DIAGNOSIS — R252 Cramp and spasm: Secondary | ICD-10-CM | POA: Diagnosis not present

## 2017-03-02 NOTE — Progress Notes (Signed)
Subjective:    Patient ID: Michele Hall, female    DOB: 1965/10/23, 51 y.o.   MRN: [id number]  HPI  Pt presents to the clinic today with c/o right knee pain. This has been going on for months. She was seen 10/2016 for the same. Xray of the right knee showed:  IMPRESSION: Moderate tricompartmental osteoarthritic change manifested chiefly by osteophyte formation. There is no significant joint space loss. There is a small suprapatellar effusion.  She was given a RX for Naproxen which she reports she hasn't really taken, because she doesn't like to take medication if she doesn't have to. She reports the pain is more consistent. It is worse with weight bearing, and walking up and down stairs. She reports she is also now having muscle cramping in her right calf. She reports she has not been doing any stretching. She tries to stay well hydrated. She is concerned that her electrolytes may be off.   Review of Systems  Past Medical History:  Diagnosis Date  . Anal fissure - posterior 2015  . Anemia   . Gallstones   . GERD (gastroesophageal reflux disease)   . Hypertension   . IBS   . Kidney stones   . Morbid obesity ([place])     Current Outpatient Prescriptions  Medication Sig Dispense Refill  . fluticasone (FLONASE) 50 MCG/ACT nasal spray Place 1 spray into both nostrils daily as needed for allergies or rhinitis.    Marland Kitchen losartan (COZAAR) 50 MG tablet TAKE 1 TABLET BY MOUTH EVERY DAY 90 tablet 0  . naproxen (NAPROSYN) 500 MG tablet Take 1 tablet (500 mg total) by mouth 2 (two) times daily with a meal. 60 tablet 0  . spironolactone (ALDACTONE) 50 MG tablet TAKE 1 TABLET BY MOUTH EVERY DAY 90 tablet 3   No current facility-administered medications for this visit.     Allergies  Allergen Reactions  . Requip [Ropinirole Hcl] Other (See Comments)    Headache Feels "sick"    Family History  Problem Relation Age of Onset  . Stroke Mother   . Kidney disease Father   . Coronary artery  disease Father   . Aneurysm Father        AAA  . Hypertension Father   . Seizures Sister   . Heart attack Maternal Grandfather   . Cancer Sister        ovarian/uterus  . Tongue cancer Sister   . Colon cancer Neg Hx     Social History   Social History  . Marital status: Married    Spouse name: N/A  . Number of children: 2  . Years of education: N/A   Occupational History  . house cleaner/ former bus driver Unemployed   Social History Main Topics  . Smoking status: Former Smoker    Quit date: 02/09/2002  . Smokeless tobacco: Never Used  . Alcohol use No  . Drug use: No  . Sexual activity: Yes    Partners: Male    Birth control/ protection: Surgical   Other Topics Concern  . Not on file   Social History Narrative   Married, 2 sons   Essentially no caffeine           Constitutional: Denies fever, malaise, fatigue, headache or abrupt weight changes.  Musculoskeletal: Pt reports right knee and calf pain. Denies decrease in range of motion, difficulty with gait, or joint swelling.    No other specific complaints in a complete review of systems (except as  listed in HPI above).     Objective:   Physical Exam   BP 106/78   Pulse 94   Temp 98.2 F (36.8 C) (Oral)   Wt 272 lb (123.4 kg)   SpO2 97%   BMI 43.24 kg/m  Wt Readings from Last 3 Encounters:  03/02/17 272 lb (123.4 kg)  10/08/16 268 lb (121.6 kg)  04/23/16 262 lb 8 oz (119.1 kg)    General: Appears her stated age, obese, in NAD. Skin: Warm, dry and intact. No redness or warmth of RLE. Musculoskeletal: Normal flexion and extension of the right knee. No joint swelling noted. Pain with palpation over the medial joint line. Calf soft, no masses noted. No difficulty with normal gait.   BMET    Component Value Date/Time   NA 140 04/16/2016 0904   K 4.2 03/02/2017 1642   CL 106 04/16/2016 0904   CO2 28 04/16/2016 0904   GLUCOSE 99 04/16/2016 0904   BUN 16 04/16/2016 0904   CREATININE 0.78  04/16/2016 0904   CALCIUM 9.6 04/16/2016 0904   GFRNONAA 76 (L) 08/09/2014 0852   GFRAA 88 (L) 08/09/2014 0852    Lipid Panel     Component Value Date/Time   CHOL 175 04/16/2016 0904   TRIG 195.0 (H) 04/16/2016 0904   HDL 40.50 04/16/2016 0904   CHOLHDL 4 04/16/2016 0904   VLDL 39.0 04/16/2016 0904   LDLCALC 95 04/16/2016 0904    CBC    Component Value Date/Time   WBC 6.4 04/16/2016 0904   RBC 4.15 04/16/2016 0904   HGB 14.0 04/16/2016 0904   HCT 39.5 04/16/2016 0904   PLT 229.0 04/16/2016 0904   MCV 95.1 04/16/2016 0904   MCH 33.3 08/09/2014 0852   MCHC 35.4 04/16/2016 0904   RDW 12.7 04/16/2016 0904   LYMPHSABS 1.9 04/16/2016 0904   MONOABS 0.7 04/16/2016 0904   EOSABS 0.2 04/16/2016 0904   BASOSABS 0.0 04/16/2016 0904    Hgb A1C No results found for: HGBA1C         Assessment & Plan:   Right Knee/Calf Pain:  Discussed the role of  NSAID's in arthritis, advised her to take the Naproxen at least once daily Discussed how obesity can affect the joints, encouraged weight loss Referral to ortho was placed by Dr. Diona Browner earlier today, we will call you to set up this appt Calf stretching exercises given Will check magnesium and potassium per patients request  RTC as needed, will follow up after labs Webb Silversmith, NP

## 2017-03-03 ENCOUNTER — Encounter: Payer: Self-pay | Admitting: Internal Medicine

## 2017-03-03 LAB — MAGNESIUM: Magnesium: 1.8 mg/dL (ref 1.5–2.5)

## 2017-03-03 LAB — POTASSIUM: POTASSIUM: 4.2 meq/L (ref 3.5–5.1)

## 2017-03-03 NOTE — Patient Instructions (Signed)
Medial Head Gastrocnemius Tear Rehab  Ask your health care provider which exercises are safe for you. Do exercises exactly as told by your health care provider and adjust them as directed. It is normal to feel mild stretching, pulling, tightness, or discomfort as you do these exercises, but you should stop right away if you feel sudden pain or your pain gets worse.Do not begin these exercises until told by your health care provider.  Stretching and range of motion exercises  These exercises warm up your muscles and joints and improve the movement and flexibility of your lower leg. These exercises also help to relieve pain and stiffness.  Exercise A: Gastrocnemius stretch    1. Sit with your left / right leg extended.  2. Loop a belt or towel around the ball of your left / right foot. The ball of your foot is on the walking surface, right under your toes.  3. Hold both ends of the belt or towel.  4. Keep your left / right ankle and foot relaxed and keep your knee straight while you use the belt or towel to pull your foot and ankle toward you. Stop at the first point of resistance.  5. Hold this position for __________ seconds.  Repeat __________ times. Complete this exercise __________ times a day.  Exercise B: Ankle alphabet    1. Sit with your left / right leg supported at the lower leg.  ? Do not rest your foot on anything.  ? Make sure your foot has room to move freely.  2. Think of your left / right foot as a paintbrush, and move your foot to trace each letter of the alphabet in the air. Keep your hip and knee still while you trace.  3. Trace every letter from A to Z.  Repeat __________ times. Complete this exercise __________ times a day.  Strengthening exercises  These exercises build strength and endurance in your lower leg. Endurance is the ability to use your muscles for a long time, even after they get tired.  Exercise C: Plantar flexors with band    1. Sit with your left / right leg extended.  2. Loop a  rubber exercise band or tube around the ball of your left / right foot. The ball of your foot is on the walking surface, right under your toes.  3. While holding both ends of the band or tube, slowly point your toes downward, pushing them away from you.  4. Hold this position for __________ seconds.  5. Slowly return your foot to the starting position.  Repeat __________ times. Complete this exercise __________ times a day.  Exercise D: Plantar flexors, standing    1. Stand with your feet shoulder-width apart.  2. Place your hands on a wall or table to steady yourself as needed, but try not to use it very much for support.  3. Rise up on your toes.  4. If this exercise is too easy, try these options:  ? Shift your weight toward your left / right leg until you feel challenged.  ? If told by your health care provider, stand on your left / right foot only.  5. Hold this position for __________ seconds.  Repeat __________ times. Complete this exercise __________ times a day.  Exercise E: Plantar flexors, eccentric    1. Stand on the balls of your feet on the edge of a step. The ball of your foot is on the walking surface, right under your toes.    2. Place your hands on a wall or railing for balance as needed, but try not to lean on it for support.  3. Rise up on your toes, using both legs to help.  4. Slowly shift all of your weight to your left / right foot and lift your other foot off the step.  5. Slowly lower your left / right heel so it drops below the level of the step. You will feel a slight stretch in your left / right calf.  6. Put your other foot back onto the step.  Repeat __________ times. Complete this exercise __________ times a day.  This information is not intended to replace advice given to you by your health care provider. Make sure you discuss any questions you have with your health care provider.  Document Released: 05/25/2005 Document Revised: 01/30/2016 Document Reviewed: 05/07/2015  Elsevier  Interactive Patient Education  2018 [employer]

## 2017-03-29 ENCOUNTER — Other Ambulatory Visit: Payer: Self-pay | Admitting: Family Medicine

## 2017-03-31 ENCOUNTER — Other Ambulatory Visit: Payer: Self-pay | Admitting: Family Medicine

## 2017-05-10 ENCOUNTER — Encounter: Payer: Self-pay | Admitting: Physician Assistant

## 2017-05-10 ENCOUNTER — Ambulatory Visit: Payer: Self-pay | Admitting: *Deleted

## 2017-05-10 ENCOUNTER — Ambulatory Visit: Payer: BC Managed Care – PPO | Admitting: Physician Assistant

## 2017-05-10 VITALS — BP 112/70 | HR 93 | Temp 98.3°F | Resp 14 | Ht 67.0 in | Wt 265.0 lb

## 2017-05-10 DIAGNOSIS — B002 Herpesviral gingivostomatitis and pharyngotonsillitis: Secondary | ICD-10-CM

## 2017-05-10 DIAGNOSIS — E538 Deficiency of other specified B group vitamins: Secondary | ICD-10-CM | POA: Diagnosis not present

## 2017-05-10 DIAGNOSIS — S00512A Abrasion of oral cavity, initial encounter: Secondary | ICD-10-CM | POA: Diagnosis not present

## 2017-05-10 MED ORDER — VALACYCLOVIR HCL 1 G PO TABS: ORAL_TABLET | ORAL | 0 refills | Status: DC

## 2017-05-10 MED ORDER — MAGIC MOUTHWASH: 5.0000 mL | Freq: Three times a day (TID) | ORAL | 0 refills | Status: DC | PRN

## 2017-05-10 NOTE — Progress Notes (Signed)
Pre visit review using our clinic review tool, if applicable. No additional management support is needed unless otherwise documented below in the visit note. 

## 2017-05-10 NOTE — Patient Instructions (Signed)
Please stay well-hydrated. Use a soft-bristled toothbrush. Start the Valtrex as directed. Also use the prescription mouthwash.  I want you to call and schedule a follow-up appointment with your Dentist. Please call me if symptoms are not improving in 48 hours with this regimen.

## 2017-05-10 NOTE — Telephone Encounter (Signed)
   Reason for Disposition . [1] One pimple or ulcer on the gum AND [2] near a toothache  Answer Assessment - Initial Assessment Questions 1. ONSET: "When did the mouth start hurting?" (e.g., hours or days ago)      Several days 2. SEVERITY: "How bad is the pain?" (Scale 1-10; mild, moderate or severe)   - MILD (1-3):  doesn't interfere with eating or normal activities   - MODERATE (4-7): interferes with eating some solids and normal activities   - SEVERE (8-10):  excruciating pain, interferes with most normal activities   - SEVERE DYSPHAGIA: can't swallow liquids, drooling     Feels raw and irritated- patient had ulcer in mouth that was painful last week  3. SORES: "Are there any sores or ulcers in the mouth?" If so, ask: "What part of the mouth are the sores in?"     Red- swollen in jaw 4. FEVER: "Do you have a fever?" If so, ask: "What is your temperature, how was it measured, and when did it start?"     no 5. CAUSE: "What do you think is causing the mouth pain?"     Not sure- can't remember eating anything 6. OTHER SYMPTOMS: "Do you have any other symptoms?" (e.g., difficulty breathing)     Some sinus off/on congestion  Protocols used: MOUTH ULCERS-A-AH, MOUTH PAIN-A-AH

## 2017-05-10 NOTE — Telephone Encounter (Signed)
Will see patient at appointment

## 2017-05-10 NOTE — Progress Notes (Signed)
Patient presents to clinic today c/o blister of upper lip x 2 days. Patient with history of oral HSV, without outbreak in quite some time. Has also noted a couple of weeks of puffiness of the inside of her cheek at the back of her teeth. Denies pain or drainage. Noted mild swelling. Is eating well without jaw pain or pain with chewing. Denies fever, chills, malaise or fatigue. Wanted to get an evaluation with her medical provider before seeing dentist.  Past Medical History:  Diagnosis Date  . Anal fissure - posterior 2015  . Anemia   . Gallstones   . GERD (gastroesophageal reflux disease)   . Hypertension   . IBS   . Kidney stones   . Morbid obesity ([place])     Current Outpatient Medications on File Prior to Visit  Medication Sig Dispense Refill  . aspirin 81 MG tablet Take 81 mg by mouth daily.    . fexofenadine (ALLEGRA) 180 MG tablet Take 180 mg by mouth daily.    Marland Kitchen losartan (COZAAR) 50 MG tablet TAKE 1 TABLET BY MOUTH DAILY 60 tablet 0  . spironolactone (ALDACTONE) 50 MG tablet TAKE 1 TABLET BY MOUTH EVERY DAY 90 tablet 0  . vitamin B-12 (CYANOCOBALAMIN) 500 MCG tablet Take 500 mcg by mouth daily.     No current facility-administered medications on file prior to visit.     Allergies  Allergen Reactions  . Requip [Ropinirole Hcl] Other (See Comments)    Headache, Feels "sick"    Family History  Problem Relation Age of Onset  . Stroke Mother   . Kidney disease Father   . Coronary artery disease Father   . Aneurysm Father        AAA  . Hypertension Father   . Seizures Sister   . Heart attack Maternal Grandfather   . Cancer Sister        ovarian/uterus  . Tongue cancer Sister   . Colon cancer Neg Hx     Social History   Socioeconomic History  . Marital status: Married    Spouse name: None  . Number of children: 2  . Years of education: None  . Highest education level: None  Social Needs  . Financial resource strain: None  . Food insecurity - worry: None  .  Food insecurity - inability: None  . Transportation needs - medical: None  . Transportation needs - non-medical: None  Occupational History  . 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 Smoking status: Former Smoker    Last attempt to quit: 02/09/2002    Years since quitting: 15.2  . Smokeless tobacco: Never Used  Substance and Sexual Activity  . Alcohol use: No  . Drug use: No  . Sexual activity: Yes    Partners: Male    Birth control/protection: Surgical  Other Topics Concern  . None  Social History Narrative   Married, 2 sons   Essentially no caffeine         Review of Systems - See HPI.  All other ROS are negative.  BP 112/70   Pulse 93   Temp 98.3 F (36.8 C) (Oral)   Resp 14   Ht 5\' 7"  (1.702 m)   Wt 265 lb (120.2 kg)   SpO2 96%   BMI 41.50 kg/m   Physical Exam  Constitutional: She is oriented to person, place, and time and well-developed, well-nourished, and in no distress.  HENT:  Head: Normocephalic and atraumatic.  Right Ear: Tympanic membrane normal.  Left Ear: Tympanic membrane normal.  Nose: Nose normal.  Mouth/Throat: Mucous membranes are normal. No oropharyngeal exudate, posterior oropharyngeal edema, posterior oropharyngeal erythema or tonsillar abscesses.    Eyes: Conjunctivae are normal.  Neck: Neck supple.  Cardiovascular: Normal rate, regular rhythm, normal heart sounds and intact distal pulses.  Pulmonary/Chest: Effort normal and breath sounds normal. No respiratory distress. She has no wheezes. She has no rales. She exhibits no tenderness.  Lymphadenopathy:    She has no cervical adenopathy.  Neurological: She is alert and oriented to person, place, and time.  Skin: Skin is warm and dry. No rash noted.  Psychiatric: Affect normal.  Vitals reviewed.   Recent Results (from the past 2160 hour(s))  Potassium     Status: None   Collection Time: 03/02/17  4:42 PM  Result Value Ref Range   Potassium  4.2 3.5 - 5.1 mEq/L  Magnesium     Status: None   Collection Time: 03/02/17  4:42 PM  Result Value Ref Range   Magnesium 1.8 1.5 - 2.5 mg/dL  B12     Status: None   Collection Time: 05/10/17  4:17 PM  Result Value Ref Range   Vitamin B-12 409 200 - 1,100 pg/mL    Assessment/Plan: 1. Oral herpes Start Valtrex. Supportive measures and supplements reviewed. - valACYclovir (VALTREX) 1000 MG tablet; Take 2 tablets twice daily x 1 day  Dispense: 4 tablet; Refill: 0  2. B12 deficiency Repeat B12 level today. Patient to continue current OTC dose until levels result. She is concerned that oral changes are secondary to deficiency. Discussed typically this would result in aphthous ulcers, cheilitis or beefy tongue, none of which are present. - B12  3. Abrasion of buccal mucosa, initial encounter Start Magic mouthwash. Supportive measures reviewed. Mild swelling likely secondary to chronic irritation from molar. Recommended evaluation with her dentist.   Leeanne Rio, PA-C

## 2017-05-11 LAB — VITAMIN B12: VITAMIN B 12: 409 pg/mL (ref 200–1100)

## 2017-05-31 ENCOUNTER — Other Ambulatory Visit: Payer: Self-pay | Admitting: Family Medicine

## 2017-06-16 ENCOUNTER — Ambulatory Visit: Payer: BC Managed Care – PPO | Admitting: Family Medicine

## 2017-06-16 ENCOUNTER — Encounter: Payer: Self-pay | Admitting: Family Medicine

## 2017-06-16 ENCOUNTER — Other Ambulatory Visit: Payer: Self-pay

## 2017-06-16 ENCOUNTER — Encounter (INDEPENDENT_AMBULATORY_CARE_PROVIDER_SITE_OTHER): Payer: Self-pay

## 2017-06-16 VITALS — BP 90/62 | HR 86 | Temp 98.2°F | Ht 67.0 in | Wt 260.8 lb

## 2017-06-16 DIAGNOSIS — T25121A Burn of first degree of right foot, initial encounter: Secondary | ICD-10-CM | POA: Diagnosis not present

## 2017-06-16 DIAGNOSIS — T2122XA Burn of second degree of abdominal wall, initial encounter: Secondary | ICD-10-CM

## 2017-06-16 MED ORDER — CEPHALEXIN 500 MG PO CAPS: 1000.0000 mg | ORAL_CAPSULE | Freq: Two times a day (BID) | ORAL | 0 refills | Status: DC

## 2017-06-16 MED ORDER — SILVER SULFADIAZINE 1 % EX CREA: 1.0000 "application " | TOPICAL_CREAM | Freq: Every day | CUTANEOUS | 0 refills | Status: DC

## 2017-06-16 NOTE — Progress Notes (Signed)
Dr. Frederico Hamman T. Kaicee Scarpino, MD, [facility] [place], [address] Phone: [phone] Fax: [phone]  06/16/2017  Patient: Michele Hall. [place], MRN: [id number], DOB: 1965-09-28, 52 y.o.  Primary Physician:  Jinny Sanders, MD   Chief Complaint  Patient presents with  . Burn    on abdomen with hot grease   Subjective:   Franciso Bend. Puccio is a 52 y.o. very pleasant female patient who presents with the following:  Cookiing wings on the grease.  She was doing this over the past weekend, and when she dropped the wings into the deep fat prior she agrees on her stomach and also a smaller amount on her foot.  This gave her an immediate burn, and the burn on her stomach did get a bubble that burst ultimately.  She has been treating this with topical Neosporin, and thinks it may have been getting a little bit more pink or red on the edges since then.  Past Medical History, Surgical History, Social History, Family History, Problem List, Medications, and Allergies have been reviewed and updated if relevant.  Patient Active Problem List   Diagnosis Date Noted  . Abnormal heart rhythm 04/23/2016  . Snoring 02/05/2015  . Allergic rhinitis 04/04/2014  . Anal fissure - posterior 09/06/2013  . ASCUS pap smear with negative high rish risk HPV 07/08/2012  . History of nephrolithiasis 10/27/2010  . Vitamin B 12 deficiency 07/18/2010  . RESTLESS LEG SYNDROME 05/07/2010  . Hyperlipidemia 10/27/2006  . OBESITY 10/27/2006  . Essential hypertension, benign 10/27/2006  . GERD 10/27/2006  . IBS 10/27/2006    Past Medical History:  Diagnosis Date  . Anal fissure - posterior 2015  . Anemia   . Gallstones   . GERD (gastroesophageal reflux disease)   . Hypertension   . IBS   . Kidney stones   . Morbid obesity ([place])     Past Surgical History:  Procedure Laterality Date  . CHOLECYSTECTOMY    . DILITATION & CURRETTAGE/HYSTROSCOPY WITH  HYDROTHERMAL ABLATION N/A 09/13/2012   Procedure: DILATATION & CURETTAGE/HYSTEROSCOPY WITH HYDROTHERMAL ABLATION;  Surgeon: Osborne Oman, MD;  Location: [facility];  Service: Gynecology;  Laterality: N/A;  . TUBAL LIGATION      Social History   Socioeconomic History  . Marital status: Married    Spouse name: Not on file  . Number of children: 2  . Years of education: Not on file  . Highest education level: Not on file  Social Needs  . Financial resource strain: Not on file  . Food insecurity - worry: Not on file  . Food insecurity - inability: Not on file  . Transportation needs - medical: Not on file  . Transportation needs - non-medical: Not on file  Occupational History  . 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 Smoking status: Former Smoker    Last attempt to quit: 02/09/2002    Years since quitting: 15.3  . Smokeless tobacco: Never Used  Substance and Sexual Activity  . Alcohol use: No  . Drug use: No  . Sexual activity: Yes    Partners: Male    Birth control/protection: Surgical  Other Topics Concern  . Not on file  Social History Narrative   Married, 2 sons   Essentially no caffeine          Family History  Problem Relation Age of Onset  . Stroke Mother   .  Kidney disease Father   . Coronary artery disease Father   . Aneurysm Father        AAA  . Hypertension Father   . Seizures Sister   . Heart attack Maternal Grandfather   . Cancer Sister        ovarian/uterus  . Tongue cancer Sister   . Colon cancer Neg Hx     Allergies  Allergen Reactions  . Requip [Ropinirole Hcl] Other (See Comments)    Headache, Feels "sick"    Medication list reviewed and updated in full in [facility].   GEN: No acute illnesses, no fevers, chills. GI: No n/v/d, eating normally Pulm: No SOB Interactive and getting along well at home.  Otherwise, ROS is as per the HPI.  Objective:   BP 90/62   Pulse 86   Temp 98.2  F (36.8 C) (Oral)   Ht 5\' 7"  (1.702 m)   Wt 260 lb 12 oz (118.3 kg)   BMI 40.84 kg/m   GEN: WDWN, NAD, Non-toxic, A & O x 3 HEENT: Atraumatic, Normocephalic. Neck supple. No masses, No LAD. Ears and Nose: No external deformity. CV: RRR, No M/G/R. No JVD. No thrill. No extra heart sounds. PULM: CTA B, no wheezes, crackles, rhonchi. No retractions. No resp. distress. No accessory muscle use. EXTR: No c/c/e NEURO Normal gait.  PSYCH: Normally interactive. Conversant. Not depressed or anxious appearing.  Calm demeanor.   SKIN: Superficial burn to the foot which is healing nicely.  On the stomach there is evidence of a second-degree burn that appears to be healing well with some surrounding pinkish coloration which is modestly warm.  The burn area is approximately 3-1/2 x 2-1/2 cm.  Laboratory and Imaging Data:  Assessment and Plan:   Burn of abdomen wall, second degree, initial encounter  Burn of foot, first degree, right, initial encounter   I think this is probably going to do okay, and I changed her to Silvadene for topical treatment.  Warm, so worried this may be getting a little bit infected, so I am going to start her on some Keflex.  Follow-up: No Follow-up on file.  Meds ordered this encounter  Medications  . silver sulfADIAZINE (SILVADENE) 1 % cream    Sig: Apply 1 application topically daily.    Dispense:  50 g    Refill:  0  . cephALEXin (KEFLEX) 500 MG capsule    Sig: Take 2 capsules (1,000 mg total) by mouth 2 (two) times daily.    Dispense:  40 capsule    Refill:  0   Medications Discontinued During This Encounter  Medication Reason  . magic mouthwash SOLN Completed Course  . valACYclovir (VALTREX) 1000 MG tablet Completed Course   Signed,  Earley Grobe T. Illias Pantano, MD   Allergies as of 06/16/2017      Reactions   Requip [ropinirole Hcl] Other (See Comments)   Headache, Feels "sick"      Medication List        Accurate as of 06/16/17 11:59 PM. Always use  your most recent med list.          aspirin 81 MG tablet Take 81 mg by mouth daily.   cephALEXin 500 MG capsule Commonly known as:  KEFLEX Take 2 capsules (1,000 mg total) by mouth 2 (two) times daily.   fexofenadine 180 MG tablet Commonly known as:  ALLEGRA Take 180 mg by mouth daily.   losartan 50 MG tablet Commonly known as:  COZAAR TAKE  1 TABLET BY MOUTH DAILY   silver sulfADIAZINE 1 % cream Commonly known as:  SILVADENE Apply 1 application topically daily.   spironolactone 50 MG tablet Commonly known as:  ALDACTONE TAKE 1 TABLET BY MOUTH EVERY DAY   vitamin B-12 500 MCG tablet Commonly known as:  CYANOCOBALAMIN Take 500 mcg by mouth daily.

## 2017-07-06 ENCOUNTER — Other Ambulatory Visit: Payer: Self-pay | Admitting: Family Medicine

## 2017-08-31 ENCOUNTER — Other Ambulatory Visit: Payer: Self-pay | Admitting: Family Medicine

## 2017-09-14 ENCOUNTER — Telehealth: Payer: Self-pay | Admitting: Family Medicine

## 2017-09-14 MED ORDER — LOSARTAN POTASSIUM 50 MG PO TABS: 50.0000 mg | ORAL_TABLET | Freq: Every day | ORAL | 0 refills | Status: DC

## 2017-09-14 MED ORDER — SPIRONOLACTONE 50 MG PO TABS
50.0000 mg | ORAL_TABLET | Freq: Every day | ORAL | 0 refills | Status: DC
Start: 2017-09-14 — End: 2017-12-08

## 2017-09-14 NOTE — Telephone Encounter (Signed)
Patient is requesting an extension of her prescriptions until her appointment- she has gotten the 15 day Rx already.

## 2017-09-14 NOTE — Telephone Encounter (Signed)
Refill  Sent to [facility] [place].

## 2017-09-14 NOTE — Telephone Encounter (Signed)
Copied from [place] [id number]. Topic: Quick Communication - Rx Refill/Question >> Sep 14, 2017  9:03 AM Michele Hall wrote: Medication: spironolactone and losartan - pt has a few days left - only 15 doses were sent in but pt cannot get in for cpe until 11/02/17 - pt requesting refills until her appt - please advise Has the patient contacted their pharmacy? Yes - advised to call doctor Preferred Pharmacy (with phone number or street name): [id number] [place], [place] - [address] [phone] (Phone) [phone] (Fax)

## 2017-10-26 ENCOUNTER — Telehealth: Payer: Self-pay | Admitting: Family Medicine

## 2017-10-26 DIAGNOSIS — E7800 Pure hypercholesterolemia, unspecified: Secondary | ICD-10-CM

## 2017-10-26 DIAGNOSIS — E538 Deficiency of other specified B group vitamins: Secondary | ICD-10-CM

## 2017-10-26 NOTE — Telephone Encounter (Signed)
-----   Message from Lendon Collar, RT sent at 10/18/2017  5:27 PM EDT ----- Regarding: Lab orders for Friday 10/29/17 Please enter lab orders for 10/29/17. Thanks-Lauren

## 2017-10-29 ENCOUNTER — Encounter: Payer: Self-pay | Admitting: Family Medicine

## 2017-10-29 ENCOUNTER — Other Ambulatory Visit (INDEPENDENT_AMBULATORY_CARE_PROVIDER_SITE_OTHER): Payer: BC Managed Care – PPO

## 2017-10-29 DIAGNOSIS — E7800 Pure hypercholesterolemia, unspecified: Secondary | ICD-10-CM

## 2017-10-29 DIAGNOSIS — E538 Deficiency of other specified B group vitamins: Secondary | ICD-10-CM

## 2017-10-29 LAB — COMPREHENSIVE METABOLIC PANEL
ALBUMIN: 4.4 g/dL (ref 3.5–5.2)
ALT: 31 U/L (ref 0–35)
AST: 26 U/L (ref 0–37)
Alkaline Phosphatase: 43 U/L (ref 39–117)
BILIRUBIN TOTAL: 0.6 mg/dL (ref 0.2–1.2)
BUN: 19 mg/dL (ref 6–23)
CO2: 26 mEq/L (ref 19–32)
CREATININE: 0.77 mg/dL (ref 0.40–1.20)
Calcium: 9.4 mg/dL (ref 8.4–10.5)
Chloride: 104 mEq/L (ref 96–112)
GFR: 84.07 mL/min (ref >60.00)
Glucose, Bld: 96 mg/dL (ref 70–99)
POTASSIUM: 4.1 meq/L (ref 3.5–5.1)
Sodium: 139 mEq/L (ref 135–145)
TOTAL PROTEIN: 7.5 g/dL (ref 6.0–8.3)

## 2017-10-29 LAB — LIPID PANEL
CHOL/HDL RATIO: 4
CHOLESTEROL: 142 mg/dL (ref 0–200)
HDL: 39 mg/dL — ABNORMAL LOW (ref >39.00)
LDL Cholesterol: 82 mg/dL (ref 0–99)
NonHDL: 102.52
TRIGLYCERIDES: 102 mg/dL (ref 0.0–149.0)
VLDL: 20.4 mg/dL (ref 0.0–40.0)

## 2017-10-29 LAB — VITAMIN B12: VITAMIN B 12: 213 pg/mL (ref 211–911)

## 2017-11-02 ENCOUNTER — Ambulatory Visit (INDEPENDENT_AMBULATORY_CARE_PROVIDER_SITE_OTHER): Payer: BC Managed Care – PPO | Admitting: Family Medicine

## 2017-11-02 ENCOUNTER — Encounter: Payer: Self-pay | Admitting: Family Medicine

## 2017-11-02 ENCOUNTER — Other Ambulatory Visit: Payer: Self-pay

## 2017-11-02 VITALS — BP 100/60 | HR 83 | Temp 98.3°F | Ht 66.5 in | Wt 251.2 lb

## 2017-11-02 DIAGNOSIS — Z0000 Encounter for general adult medical examination without abnormal findings: Secondary | ICD-10-CM

## 2017-11-02 DIAGNOSIS — J0100 Acute maxillary sinusitis, unspecified: Secondary | ICD-10-CM | POA: Diagnosis not present

## 2017-11-02 DIAGNOSIS — I10 Essential (primary) hypertension: Secondary | ICD-10-CM | POA: Diagnosis not present

## 2017-11-02 DIAGNOSIS — E7800 Pure hypercholesterolemia, unspecified: Secondary | ICD-10-CM

## 2017-11-02 MED ORDER — FEXOFENADINE HCL 180 MG PO TABS: 180.0000 mg | ORAL_TABLET | Freq: Every day | ORAL | 3 refills | Status: DC

## 2017-11-02 MED ORDER — AMOXICILLIN 500 MG PO CAPS: 1000.0000 mg | ORAL_CAPSULE | Freq: Two times a day (BID) | ORAL | 0 refills | Status: DC

## 2017-11-02 NOTE — Patient Instructions (Addendum)
Keep up working on healthy eating and add exercise as able.  Restart B12 1000 mcg daily  Call to set up mammogram on your own.   Restart allegra or xyzal.  Stop nasal steroid spray given given bloody nose.  If sinus pain is not improving or new fever.. Complete course of antibiotics.

## 2017-11-02 NOTE — Assessment & Plan Note (Signed)
Most likely allergic.. Treat with allegra. Stop flonase as may be causing bloody nose.  Given length of time of symptoms.. Will given Rx for antibiotics to use if not improving as expected.

## 2017-11-02 NOTE — Assessment & Plan Note (Signed)
Well controlled. Continue current medication.  

## 2017-11-02 NOTE — Assessment & Plan Note (Signed)
Good control with diet. Encouraged exercise, weight loss, healthy eating habits.  

## 2017-11-02 NOTE — Progress Notes (Signed)
Subjective:    Patient ID: Michele Hall, female    DOB: 1965/09/09, 52 y.o.   MRN: [id number]  HPI  The patient is here for annual wellness exam and preventative care.    She is also having acute issues with her sinuses.  She has been having sinus pressure and pain in last 2 weeks. Headache.  Nose bleed yesterday.  No congestion, occ sneeze. Was in moldy basement which made it worse.  she has tried nasal steroid, allergy med... Did not help. No fever,no sob.  occ bilateral ear pain.  Hypertension:   good control on current regimen.  Losartan and aldactone Using medication without problems or lightheadedness:  none Chest pain with exertion: none Edema: none Short of breath:none Average home BPs: Other issues:  Elevated Cholesterol:  LDL at goal on no med. Lab Results  Component Value Date   CHOL 142 10/29/2017   HDL 39.00 (L) 10/29/2017   LDLCALC 82 10/29/2017   LDLDIRECT 84.1 07/03/2013   TRIG 102.0 10/29/2017   CHOLHDL 4 10/29/2017  Using medications without problems: Muscle aches:  Diet compliance: using keto diet.. Has lost 10 lbs Exercise:none Other complaints: Wt Readings from Last 3 Encounters:  11/02/17 251 lb 4 oz (114 kg)  06/16/17 260 lb 12 oz (118.3 kg)  05/10/17 265 lb (120.2 kg)     Social History /Family History/Past Medical History reviewed in detail and updated in EMR if needed. Blood pressure 100/60, pulse 83, temperature 98.3 F (36.8 C), temperature source Oral, height 5' 6.5" (1.689 m), weight 251 lb 4 oz (114 kg).  Review of Systems  Constitutional: Negative for fatigue and fever.  HENT: Negative for congestion.   Eyes: Negative for pain.  Respiratory: Negative for cough and shortness of breath.   Cardiovascular: Negative for chest pain, palpitations and leg swelling.  Gastrointestinal: Negative for abdominal pain.  Genitourinary: Negative for dysuria and vaginal bleeding.  Musculoskeletal: Negative for back pain.  Neurological:  Negative for syncope, light-headedness and headaches.  Psychiatric/Behavioral: Negative for dysphoric mood.       Objective:   Physical Exam  Constitutional: Vital signs are normal. She appears well-developed and well-nourished. She is cooperative.  Non-toxic appearance. She does not appear ill. No distress.  HENT:  Head: Normocephalic.  Right Ear: Hearing, tympanic membrane, external ear and ear canal normal.  Left Ear: Hearing, tympanic membrane, external ear and ear canal normal.  Nose: Nose normal.  Eyes: Pupils are equal, round, and reactive to light. Conjunctivae, EOM and lids are normal. Lids are everted and swept, no foreign bodies found.  Neck: Trachea normal and normal range of motion. Neck supple. Carotid bruit is not present. No thyroid mass and no thyromegaly present.  Cardiovascular: Normal rate, regular rhythm, S1 normal, S2 normal, normal heart sounds and intact distal pulses. Exam reveals no gallop.  No murmur heard. Pulmonary/Chest: Effort normal and breath sounds normal. No respiratory distress. She has no wheezes. She has no rhonchi. She has no rales.  Abdominal: Soft. Normal appearance and bowel sounds are normal. She exhibits no distension, no fluid wave, no abdominal bruit and no mass. There is no hepatosplenomegaly. There is no tenderness. There is no rebound, no guarding and no CVA tenderness. No hernia.  Lymphadenopathy:    She has no cervical adenopathy.    She has no axillary adenopathy.  Neurological: She is alert. She has normal strength. No cranial nerve deficit or sensory deficit.  Skin: Skin is warm, dry and intact. No rash  noted.  Psychiatric: Her speech is normal and behavior is normal. Judgment normal. Her mood appears not anxious. Cognition and memory are normal. She does not exhibit a depressed mood.          Assessment & Plan:  The patient's preventative maintenance and recommended screening tests for an annual wellness exam were reviewed in full  today. Brought up to date unless services declined.  Counselled on the importance of diet, exercise, and its role in overall health and mortality. The patient's FH and SH was reviewed, including their home life, tobacco status, and drug and alcohol status.    Vaccines: Uptodate withTdap, refused flu Pap/DVE:   abn in past.. Plan to return here instead on GYN Mammo:  last mammo 2015.Marland Kitchen Plan this year Bone Density: not indicated Colon:  GI Dr. Carlean Purl 09/2013 nml colonoscopy repeat in 2025 Smoking Status: former smoker QUIT 2003 HIV screen:  refused

## 2017-11-19 ENCOUNTER — Ambulatory Visit: Payer: BC Managed Care – PPO | Admitting: Family Medicine

## 2017-11-26 ENCOUNTER — Ambulatory Visit: Payer: BC Managed Care – PPO | Admitting: Family Medicine

## 2017-11-30 ENCOUNTER — Ambulatory Visit: Payer: BC Managed Care – PPO | Admitting: Family Medicine

## 2017-12-02 ENCOUNTER — Ambulatory Visit (INDEPENDENT_AMBULATORY_CARE_PROVIDER_SITE_OTHER): Payer: BC Managed Care – PPO | Admitting: Family Medicine

## 2017-12-02 ENCOUNTER — Other Ambulatory Visit (HOSPITAL_COMMUNITY)
Admission: RE | Admit: 2017-12-02 | Discharge: 2017-12-02 | Disposition: A | Discharge status: Home / Self Care | Payer: BC Managed Care – PPO | Source: Ambulatory Visit | Attending: Family Medicine | Admitting: Family Medicine

## 2017-12-02 ENCOUNTER — Encounter: Payer: Self-pay | Admitting: Family Medicine

## 2017-12-02 VITALS — BP 112/60 | HR 91 | Ht 66.5 in | Wt 242.5 lb

## 2017-12-02 DIAGNOSIS — Z124 Encounter for screening for malignant neoplasm of cervix: Secondary | ICD-10-CM | POA: Insufficient documentation

## 2017-12-02 DIAGNOSIS — R232 Flushing: Secondary | ICD-10-CM | POA: Insufficient documentation

## 2017-12-02 LAB — TSH: TSH: 2.53 u[IU]/mL (ref 0.35–4.50)

## 2017-12-02 LAB — FOLLICLE STIMULATING HORMONE: FSH: 39.4 m[IU]/mL

## 2017-12-02 LAB — LUTEINIZING HORMONE: LH: 31.45 m[IU]/mL

## 2017-12-02 NOTE — Assessment & Plan Note (Signed)
Likely menopausal symptoms. Eval with labs.

## 2017-12-02 NOTE — Progress Notes (Signed)
   Subjective:    Patient ID: Michele Hall. Tremblay, female    DOB: 08-Dec-1965, 52 y.o.   MRN: [id number]  HPI  Pt returns today for pap smear only. Sheis doing well overall except thinks she is menopausal with hot flashes, moodiness and poor sleep.  She wants to check her hormone levels.   She has no menses since her uterine ablation.  Social History /Family History/Past Medical History reviewed in detail and updated in EMR if needed. Blood pressure 112/60, pulse 91, height 5' 6.5" (1.689 m), weight 242 lb 8 oz (110 kg), SpO2 97 %.  Review of Systems  Constitutional: Positive for fatigue. Negative for fever.  HENT: Negative for ear pain.   Eyes: Negative for pain.  Respiratory: Negative for chest tightness and shortness of breath.   Cardiovascular: Negative for chest pain, palpitations and leg swelling.  Gastrointestinal: Negative for abdominal pain.  Genitourinary: Negative for dysuria.  Psychiatric/Behavioral: Positive for sleep disturbance. The patient is nervous/anxious.        Objective:   Physical Exam  Genitourinary: Rectum normal and vagina normal. Pelvic exam was performed with patient supine. There is no rash, tenderness or lesion on the right labia. There is no rash or tenderness on the left labia. Right adnexum displays no mass, no tenderness and no fullness. Left adnexum displays no mass, no tenderness and no fullness.  Psychiatric: Her speech is normal. Her mood appears anxious. She is agitated. Cognition and memory are not impaired. She is attentive.          Assessment & Plan:

## 2017-12-02 NOTE — Patient Instructions (Signed)
Please stop at the lab to have labs drawn. Make an appt to be seen after labs if still having issues.

## 2017-12-02 NOTE — Assessment & Plan Note (Signed)
Pap performend with no issue.

## 2017-12-03 LAB — ESTRADIOL: ESTRADIOL: 27 pg/mL

## 2017-12-06 LAB — CYTOLOGY - PAP
DIAGNOSIS: NEGATIVE
HPV (WINDOPATH): NOT DETECTED

## 2017-12-08 ENCOUNTER — Other Ambulatory Visit: Payer: Self-pay | Admitting: Family Medicine

## 2017-12-28 ENCOUNTER — Ambulatory Visit: Payer: Self-pay | Admitting: *Deleted

## 2017-12-28 ENCOUNTER — Telehealth: Payer: Self-pay | Admitting: Family Medicine

## 2017-12-28 NOTE — Telephone Encounter (Signed)
Patient phoned with itching, feeling like insects are biting her under her clothes. She also has a lingering throaty non productive cough with occasional wheeze on inhale. This began occurring about one month ago when there were carpet beetles found in her house. She has since removed carpet and curtains and had exterminators doing treatments. She denies SOB/Fever/CP/dizziness. She does not have any red marks, bites or bumps on her skin. 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  Reports her husband experienced the biting feeling early on but no longer. Her grandson also says he feels a biting sensation all the time.  Reviewed care advice including hydrocortisone lotion, aveeno oatmeal bath and continuing Allegra daily. Other advice listed in protocol discussed. Because of her wheeze and continued cough, earliest available appointment scheduled with Dr. Diona Browner.   Reason for Disposition . [1] SEVERE local itching (i.e., interferes with work, school, sleep) AND [2] not improved after 24 hours of hydrocortisone cream  Answer Assessment - Initial Assessment Questions 1. DESCRIPTION: "Describe the itching you are having."     Like mites are biting me where my clothes touch my skin. 2. SEVERITY: "How bad is it?"    - MILD - doesn't interfere with normal activities   - MODERATE - SEVERE: interferes with work, school, sleep, or other activities      Moderate. Is interfering with all aspects of her life. 3. SCRATCHING: "Are there any scratch marks? Bleeding?"     no 4. ONSET: "When did this begin?"      About one month. 5. CAUSE: "What do you think is causing the itching?" (ask about swimming pools, pollen, animals, soaps, etc.)    No to new animals, lotions, soaps, swimming. No new chemicals with exterminator. No new  furniture. 6. OTHER SYMPTOMS: "Do you have any other symptoms?"      Lingering cough that is improving. 7. PREGNANCY: "Is there any chance you are pregnant?" "When was your last menstrual period?"     no  Protocols used: [facility] Michele Hall - [facility]

## 2017-12-28 NOTE — Telephone Encounter (Signed)
Converted to triage call.

## 2017-12-31 ENCOUNTER — Encounter: Payer: Self-pay | Admitting: Family Medicine

## 2017-12-31 ENCOUNTER — Ambulatory Visit: Payer: BC Managed Care – PPO | Admitting: Family Medicine

## 2017-12-31 ENCOUNTER — Other Ambulatory Visit: Payer: Self-pay | Admitting: Family Medicine

## 2017-12-31 DIAGNOSIS — B9789 Other viral agents as the cause of diseases classified elsewhere: Secondary | ICD-10-CM

## 2017-12-31 DIAGNOSIS — J069 Acute upper respiratory infection, unspecified: Secondary | ICD-10-CM | POA: Diagnosis not present

## 2017-12-31 NOTE — Assessment & Plan Note (Signed)
Symptomatic care.  No sign of bacterial infection.  Maybe allergic reaction to bug sprays.

## 2017-12-31 NOTE — Progress Notes (Signed)
   Subjective:    Patient ID: Michele Hall. Wynder, female    DOB: Feb 07, 1966, 52 y.o.   MRN: [id number]  Cough  This is a new problem. The problem has been gradually improving. The cough is non-productive. Associated symptoms include nasal congestion, postnasal drip and wheezing. Pertinent negatives include no chills, ear pain, fever, myalgias, rash, sore throat or shortness of breath. Associated symptoms comments:  Hoarse voice, post nasal drip. The symptoms are aggravated by lying down. Risk factors for lung disease include smoking/tobacco exposure. She has tried nothing (essential oils) for the symptoms. Her past medical history is significant for environmental allergies. There is no history of asthma, COPD or emphysema.    Former remote smoker Social History /Family History/Past Medical History reviewed in detail and updated in EMR if needed. Blood pressure 106/76, pulse 80, temperature 98 F (36.7 C), temperature source Oral, resp. rate 18, height 5' 6.5" (1.689 m), weight 237 lb (107.5 kg), SpO2 96 %.  Has been treating bugs at the house with pesticides.  Review of Systems  Constitutional: Negative for chills and fever.  HENT: Positive for postnasal drip. Negative for ear pain and sore throat.   Respiratory: Positive for cough and wheezing. Negative for shortness of breath.   Musculoskeletal: Negative for myalgias.  Skin: Negative for rash.  Allergic/Immunologic: Positive for environmental allergies.       Objective:   Physical Exam  Constitutional: Vital signs are normal. She appears well-developed and well-nourished. She is cooperative.  Non-toxic appearance. She does not appear ill. No distress.  HENT:  Head: Normocephalic.  Right Ear: Hearing, tympanic membrane, external ear and ear canal normal. Tympanic membrane is not erythematous, not retracted and not bulging.  Left Ear: Hearing, tympanic membrane, external ear and ear canal normal. Tympanic membrane is not erythematous, not  retracted and not bulging.  Nose: Mucosal edema and rhinorrhea present. Right sinus exhibits no maxillary sinus tenderness and no frontal sinus tenderness. Left sinus exhibits no maxillary sinus tenderness and no frontal sinus tenderness.  Mouth/Throat: Uvula is midline, oropharynx is clear and moist and mucous membranes are normal.  Eyes: Pupils are equal, round, and reactive to light. Conjunctivae, EOM and lids are normal. Lids are everted and swept, no foreign bodies found.  Neck: Trachea normal and normal range of motion. Neck supple. Carotid bruit is not present. No thyroid mass and no thyromegaly present.  Cardiovascular: Normal rate, regular rhythm, S1 normal, S2 normal, normal heart sounds, intact distal pulses and normal pulses. Exam reveals no gallop and no friction rub.  No murmur heard. Pulmonary/Chest: Effort normal and breath sounds normal. No tachypnea. No respiratory distress. She has no decreased breath sounds. She has no wheezes. She has no rhonchi. She has no rales.  Neurological: She is alert.  Skin: Skin is warm, dry and intact. No rash noted.  Psychiatric: Her speech is normal and behavior is normal. Judgment normal. Her mood appears not anxious. Cognition and memory are normal. She does not exhibit a depressed mood.          Assessment & Plan:

## 2017-12-31 NOTE — Patient Instructions (Signed)
Continuet allegra at night.  Can use mucinex DM twice daily.

## 2018-04-13 ENCOUNTER — Encounter: Payer: Self-pay | Admitting: Family Medicine

## 2018-04-13 ENCOUNTER — Ambulatory Visit: Payer: BC Managed Care – PPO | Admitting: Family Medicine

## 2018-04-13 VITALS — BP 102/60 | HR 86 | Temp 98.5°F | Ht 66.5 in | Wt 247.0 lb

## 2018-04-13 DIAGNOSIS — H6992 Unspecified Eustachian tube disorder, left ear: Secondary | ICD-10-CM | POA: Diagnosis not present

## 2018-04-13 MED ORDER — FLUTICASONE PROPIONATE 50 MCG/ACT NA SUSP: 2.0000 | Freq: Every day | NASAL | 6 refills | Status: DC

## 2018-04-13 NOTE — Progress Notes (Signed)
Subjective:    Patient ID: Michele Scorsone. Hall, female    DOB: 04-Dec-1965, 52 y.o.   MRN: [id number]  HPI This is a 52 yo female who presents today with otalgia of left ear x 2 weeks. Pain is achy, constant. Has a little nasal drainage. Intermittent sinus pressure. No cough. No fever. Has been taking ibuprofen 400-600 mg twice a day . A little relief. Has history of frequent ear infections as a child. Recently traveled to [place].  Hearing ok.   Past Medical History:  Diagnosis Date  . Anal fissure - posterior 2015  . Anemia   . Gallstones   . GERD (gastroesophageal reflux disease)   . Hypertension   . IBS   . Kidney stones   . Morbid obesity ([place])    Past Surgical History:  Procedure Laterality Date  . CHOLECYSTECTOMY    . DILITATION & CURRETTAGE/HYSTROSCOPY WITH HYDROTHERMAL ABLATION N/A 09/13/2012   Procedure: DILATATION & CURETTAGE/HYSTEROSCOPY WITH HYDROTHERMAL ABLATION;  Surgeon: Osborne Oman, MD;  Location: [facility];  Service: Gynecology;  Laterality: N/A;  . TUBAL LIGATION     Family History  Problem Relation Age of Onset  . Stroke Mother   . Kidney disease Father   . Coronary artery disease Father   . Aneurysm Father        AAA  . Hypertension Father   . Seizures Sister   . Heart attack Maternal Grandfather   . Cancer Sister        ovarian/uterus  . Tongue cancer Sister   . Colon cancer Neg Hx    Social History   Tobacco Use  . Smoking status: Former Smoker    Last attempt to quit: 02/09/2002    Years since quitting: 16.1  . Smokeless tobacco: Never Used  Substance Use Topics  . Alcohol use: No  . Drug use: No      Review of Systems Per HPI    Objective:   Physical Exam  Constitutional: She appears well-developed and well-nourished.  HENT:  Head: Normocephalic and atraumatic.  Right Ear: External ear and ear canal normal. Tympanic membrane is scarred.  Left Ear: External ear and ear canal normal. A middle ear effusion (no fluid level) is present.    Nose: Nose normal.  Mouth/Throat: Oropharynx is clear and moist.  No TMJ clicking/grinding.   Eyes: Conjunctivae are normal.  Neck: Normal range of motion. Neck supple.  Cardiovascular: Normal rate, regular rhythm and normal heart sounds.  Pulmonary/Chest: Effort normal and breath sounds normal.  Lymphadenopathy:    She has no cervical adenopathy.  Skin: Skin is warm and dry.  Psychiatric: She has a normal mood and affect. Her behavior is normal. Judgment and thought content normal.  Vitals reviewed.     BP 102/60 (BP Location: Left Arm, Patient Position: Sitting, Cuff Size: Large)   Pulse 86   Temp 98.5 F (36.9 C) (Oral)   Ht 5' 6.5" (1.689 m)   Wt 247 lb (112 kg)   SpO2 97%   BMI 39.27 kg/m  Wt Readings from Last 3 Encounters:  04/13/18 247 lb (112 kg)  12/31/17 237 lb (107.5 kg)  12/02/17 242 lb 8 oz (110 kg)  ]    Assessment & Plan:  1. Disorder of left eustachian tube - Provided written and verbal information regarding diagnosis and treatment. - Can add pseudoephedrine sparingly prn, warm compresses, continue NSAID - RTC precautions reviewed - if no improvement in 5-7 days, can refer to ENT -  fluticasone (FLONASE) 50 MCG/ACT nasal spray; Place 2 sprays into both nostrils daily.  Dispense: 16 g; Refill: [place], FNP-BC  [facility], [place]  04/13/2018 10:30 AM

## 2018-04-13 NOTE — Patient Instructions (Signed)
Use your fluticasone nasal spray twice a day for 3 days, then daily at bedtime  Can use sudafed 1-2 times a day as needed    Eustachian Tube Dysfunction The eustachian tube connects the middle ear to the back of the nose. It regulates air pressure in the middle ear by allowing air to move between the ear and nose. It also helps to drain fluid from the middle ear space. When the eustachian tube does not function properly, air pressure, fluid, or both can build up in the middle ear. Eustachian tube dysfunction can affect one or both ears. What are the causes? This condition happens when the eustachian tube becomes blocked or cannot open normally. This may result from:  Ear infections.  Colds and other upper respiratory infections.  Allergies.  Irritation, such as from cigarette smoke or acid from the stomach coming up into the esophagus (gastroesophageal reflux).  Sudden changes in air pressure, such as from descending in an airplane.  Abnormal growths in the nose or throat, such as nasal polyps, tumors, or enlarged tissue at the back of the throat (adenoids).  What increases the risk? This condition may be more likely to develop in people who smoke and people who are overweight. Eustachian tube dysfunction may also be more likely to develop in children, especially children who have:  Certain birth defects of the mouth, such as cleft palate.  Large tonsils and adenoids.  What are the signs or symptoms? Symptoms of this condition may include:  A feeling of fullness in the ear.  Ear pain.  Clicking or popping noises in the ear.  Ringing in the ear.  Hearing loss.  Loss of balance.  Symptoms may get worse when the air pressure around you changes, such as when you travel to an area of high elevation or fly on an airplane. How is this diagnosed? This condition may be diagnosed based on:  Your symptoms.  A physical exam of your ear, nose, and throat.  Tests, such as  those that measure: ? The movement of your eardrum (tympanogram). ? Your hearing (audiometry).  How is this treated? Treatment depends on the cause and severity of your condition. If your symptoms are mild, you may be able to relieve your symptoms by moving air into ("popping") your ears. If you have symptoms of fluid in your ears, treatment may include:  Decongestants.  Antihistamines.  Nasal sprays or ear drops that contain medicines that reduce swelling (steroids).  In some cases, you may need to have a procedure to drain the fluid in your eardrum (myringotomy). In this procedure, a small tube is placed in the eardrum to:  Drain the fluid.  Restore the air in the middle ear space.  Follow these instructions at home:  Take over-the-counter and prescription medicines only as told by your health care provider.  Use techniques to help pop your ears as recommended by your health care provider. These may include: ? Chewing gum. ? Yawning. ? Frequent, forceful swallowing. ? Closing your mouth, holding your nose closed, and gently blowing as if you are trying to blow air out of your nose.  Do not do any of the following until your health care provider approves: ? Travel to high altitudes. ? Fly in airplanes. ? Work in a Pension scheme manager or room. ? Scuba dive.  Keep your ears dry. Dry your ears completely after showering or bathing.  Do not smoke.  Keep all follow-up visits as told by your health care provider.  This is important. Contact a health care provider if:  Your symptoms do not go away after treatment.  Your symptoms come back after treatment.  You are unable to pop your ears.  You have: ? A fever. ? Pain in your ear. ? Pain in your head or neck. ? Fluid draining from your ear.  Your hearing suddenly changes.  You become very dizzy.  You lose your balance. This information is not intended to replace advice given to you by your health care provider. Make  sure you discuss any questions you have with your health care provider. Document Released: 06/21/2015 Document Revised: 10/31/2015 Document Reviewed: 06/13/2014 Elsevier Interactive Patient Education  [employer].

## 2018-06-10 ENCOUNTER — Other Ambulatory Visit: Payer: Self-pay | Admitting: Family Medicine

## 2018-07-21 ENCOUNTER — Ambulatory Visit: Payer: BC Managed Care – PPO | Admitting: Family Medicine

## 2018-07-21 ENCOUNTER — Encounter: Payer: Self-pay | Admitting: Family Medicine

## 2018-07-21 VITALS — BP 110/62 | HR 91 | Temp 97.9°F | Ht 66.5 in | Wt 261.2 lb

## 2018-07-21 DIAGNOSIS — R12 Heartburn: Secondary | ICD-10-CM | POA: Diagnosis not present

## 2018-07-21 DIAGNOSIS — R140 Abdominal distension (gaseous): Secondary | ICD-10-CM | POA: Diagnosis not present

## 2018-07-21 MED ORDER — RANITIDINE HCL 150 MG PO CAPS: 150.0000 mg | ORAL_CAPSULE | Freq: Two times a day (BID) | ORAL | 1 refills | Status: DC

## 2018-07-21 NOTE — Patient Instructions (Signed)
Abdominal Bloating  Here is what I would recommend  1) Start Ranitidine 150 mg twice daily  2) Consider decreasing Miralax or switching to Colace to see if that helps  3) Consider an elimination diet   --- Either the Whole 30 (which you can find information about online)  --- Or you can eliminate one item at a time - like dairy, beans, gluten, sugar, etc

## 2018-07-21 NOTE — Progress Notes (Signed)
Subjective:     Michele Hall. Nasby is a 53 y.o. female presenting for Abdominal Pain (off and on for a while. Patient states shes bloated, experiencing pain. nausea. Taking mirlax daily. )     Abdominal Pain  This is a chronic problem. The current episode started more than 1 month ago. The onset quality is sudden. The problem occurs every several days. Duration: 1. The problem has been waxing and waning. The pain is located in the periumbilical region and epigastric region. The quality of the pain is a sensation of fullness. The abdominal pain does not radiate. Associated symptoms include flatus and nausea. Pertinent negatives include no belching, constipation, diarrhea, dysuria, frequency, headaches, hematuria or vomiting. Associated symptoms comments: Bloating. The pain is aggravated by eating. The pain is relieved by certain positions. She has tried antacids (miralax, probiotics) for the symptoms. The treatment provided no relief. Her past medical history is significant for GERD (long hx of reflux, hpylori).     When she takes PPI it makes her feel like she is going to have a heart attack - severe chest pain, has not taken this in the last 6 months  Diet -  Breakfast: banana nut muffin,  coffee - 16 oz in AM Lunch: will eat out - salad or fast food, [place] - fajita  Dinner: greek salad with spinocopata, chicken kabob   In generally avoids dairy Has not done any ellimination  Review of Systems  Gastrointestinal: Positive for abdominal pain, flatus and nausea. Negative for constipation, diarrhea and vomiting.  Genitourinary: Negative for dysuria, frequency and hematuria.  Neurological: Negative for headaches.     Social History   Tobacco Use  Smoking Status Former Smoker  . Last attempt to quit: 02/09/2002  . Years since quitting: 16.4  Smokeless Tobacco Never Used        Objective:    BP Readings from Last 3 Encounters:  07/21/18 110/62  04/13/18 102/60  12/31/17 106/76     Wt Readings from Last 3 Encounters:  07/21/18 261 lb 4 oz (118.5 kg)  04/13/18 247 lb (112 kg)  12/31/17 237 lb (107.5 kg)    BP 110/62   Pulse 91   Temp 97.9 F (36.6 C)   Ht 5' 6.5" (1.689 m)   Wt 261 lb 4 oz (118.5 kg)   SpO2 94%   BMI 41.54 kg/m    Physical Exam Constitutional:      General: She is not in acute distress.    Appearance: She is well-developed. She is obese. She is not diaphoretic.  HENT:     Right Ear: External ear normal.     Left Ear: External ear normal.     Nose: Nose normal.     Mouth/Throat:     Mouth: Mucous membranes are moist.  Eyes:     General: No scleral icterus.    Conjunctiva/sclera: Conjunctivae normal.  Neck:     Musculoskeletal: Neck supple.  Cardiovascular:     Rate and Rhythm: Normal rate and regular rhythm.     Heart sounds: No murmur.  Pulmonary:     Effort: Pulmonary effort is normal. No respiratory distress.     Breath sounds: Normal breath sounds. No wheezing.  Abdominal:     General: Abdomen is flat. A surgical scar is present. Bowel sounds are normal. There is no distension.     Palpations: Abdomen is soft. There is no hepatomegaly.     Tenderness: There is abdominal tenderness in the epigastric  area. There is no guarding or rebound. Negative signs include Murphy's sign.  Skin:    General: Skin is warm and dry.     Capillary Refill: Capillary refill takes less than 2 seconds.  Neurological:     General: No focal deficit present.     Mental Status: She is alert. Mental status is at baseline.  Psychiatric:        Mood and Affect: Mood normal.        Behavior: Behavior normal.           Assessment & Plan:   Problem List Items Addressed This Visit      Other   Abdominal bloating    Pt with known hx of GERD and not currently on medication. S/p GB removal so lower suspicion for that pathology. No specific food trigger though occasionally difficult historian. Resistant to PPI due to side effects. Discussed H2  blocker as treatment. Also advised decrease stool softener or changing type may help as well. If not improvement and since associated with most foods could trial elimination diet to see if specific foods are causing symptoms. Return if no improvement to consider further work-up at that time.       Relevant Medications   ranitidine (ZANTAC) 150 MG capsule    Other Visit Diagnoses    Heartburn    -  Primary   Relevant Medications   ranitidine (ZANTAC) 150 MG capsule       Return if symptoms worsen or fail to improve.  Lesleigh Noe, MD

## 2018-07-21 NOTE — Assessment & Plan Note (Signed)
Pt with known hx of GERD and not currently on medication. S/p GB removal so lower suspicion for that pathology. No specific food trigger though occasionally difficult historian. Resistant to PPI due to side effects. Discussed H2 blocker as treatment. Also advised decrease stool softener or changing type may help as well. If not improvement and since associated with most foods could trial elimination diet to see if specific foods are causing symptoms. Return if no improvement to consider further work-up at that time.

## 2018-08-06 ENCOUNTER — Encounter (HOSPITAL_COMMUNITY): Payer: Self-pay

## 2018-08-06 ENCOUNTER — Emergency Department (HOSPITAL_COMMUNITY)
Admission: EM | Admit: 2018-08-06 | Discharge: 2018-08-06 | Disposition: A | Discharge status: Home / Self Care | Payer: BC Managed Care – PPO | Attending: Emergency Medicine | Admitting: Emergency Medicine

## 2018-08-06 DIAGNOSIS — Z87891 Personal history of nicotine dependence: Secondary | ICD-10-CM | POA: Diagnosis not present

## 2018-08-06 DIAGNOSIS — Z79899 Other long term (current) drug therapy: Secondary | ICD-10-CM | POA: Insufficient documentation

## 2018-08-06 DIAGNOSIS — I10 Essential (primary) hypertension: Secondary | ICD-10-CM | POA: Insufficient documentation

## 2018-08-06 DIAGNOSIS — R040 Epistaxis: Secondary | ICD-10-CM | POA: Diagnosis not present

## 2018-08-06 DIAGNOSIS — E785 Hyperlipidemia, unspecified: Secondary | ICD-10-CM | POA: Diagnosis not present

## 2018-08-06 MED ORDER — OXYMETAZOLINE HCL 0.05 % NA SOLN
1.0000 | Freq: Once | NASAL | Status: AC
  Administered 2018-08-06: 1 via NASAL
  Filled 2018-08-06: qty 30

## 2018-08-06 NOTE — ED Provider Notes (Signed)
[facility] EMERGENCY DEPARTMENT Provider Note   CSN: [id number] Arrival date & time: 08/06/18  5427    History   Chief Complaint No chief complaint on file.   HPI Michele Hall. Michele Hall is a 53 y.o. female.     The history is provided by the patient.  Epistaxis  Location:  Unable to specify Severity:  Mild Progression:  Resolved Chronicity:  New Context: not anticoagulants   Relieved by:  Nothing Worsened by:  Nothing Associated symptoms: no congestion, no sneezing and no sore throat     Past Medical History:  Diagnosis Date  . Anal fissure - posterior 2015  . Anemia   . Gallstones   . GERD (gastroesophageal reflux disease)   . Hypertension   . IBS   . Kidney stones   . Morbid obesity [facility])     Patient Active Problem List   Diagnosis Date Noted  . Cervical cancer screening 12/02/2017  . Hot flashes 12/02/2017  . Abnormal heart rhythm 04/23/2016  . Snoring 02/05/2015  . Allergic rhinitis 04/04/2014  . Anal fissure - posterior 09/06/2013  . Abdominal bloating 07/11/2013  . ASCUS pap smear with negative high rish risk HPV 07/08/2012  . Sinusitis 10/01/2011  . History of nephrolithiasis 10/27/2010  . Vitamin B 12 deficiency 07/18/2010  . RESTLESS LEG SYNDROME 05/07/2010  . Hyperlipidemia 10/27/2006  . OBESITY 10/27/2006  . Essential hypertension, benign 10/27/2006  . GERD 10/27/2006  . IBS 10/27/2006    Past Surgical History:  Procedure Laterality Date  . CHOLECYSTECTOMY    . DILITATION & CURRETTAGE/HYSTROSCOPY WITH HYDROTHERMAL ABLATION N/A 09/13/2012   Procedure: DILATATION & CURETTAGE/HYSTEROSCOPY WITH HYDROTHERMAL ABLATION;  Surgeon: Osborne Oman, MD;  Location: [facility];  Service: Gynecology;  Laterality: N/A;  . TUBAL LIGATION       OB History    Gravida  2   Para  2   Term  2   Preterm      AB      Living  2     SAB      TAB      Ectopic      Multiple      Live Births               Home Medications     Prior to Admission medications   Medication Sig Start Date End Date Taking? Authorizing Provider  fexofenadine (ALLEGRA) 180 MG tablet TAKE 1 TABLET BY MOUTH EVERY DAY 12/31/17   Bedsole, Amy E, MD  fluticasone (FLONASE) 50 MCG/ACT nasal spray Place 2 sprays into both nostrils daily. 04/13/18   Elby Beck, FNP  losartan (COZAAR) 50 MG tablet TAKE 1 TABLET BY MOUTH EVERY DAY 06/10/18   Bedsole, Amy E, MD  Probiotic Product (PROBIOTIC-10 PO) Take by mouth.    [provider]  ranitidine (ZANTAC) 150 MG capsule Take 1 capsule (150 mg total) by mouth 2 (two) times daily. 07/21/18   Lesleigh Noe, MD  spironolactone (ALDACTONE) 50 MG tablet TAKE 1 TABLET BY MOUTH EVERY DAY 06/10/18   Bedsole, Amy E, MD  vitamin E 100 UNIT capsule Take by mouth daily.    [provider]    Family History Family History  Problem Relation Age of Onset  . Stroke Mother   . Kidney disease Father   . Coronary artery disease Father   . Aneurysm Father        AAA  . Hypertension Father   . Seizures Sister   .  Heart attack Maternal Grandfather   . Cancer Sister        ovarian/uterus  . Tongue cancer Sister   . Colon cancer Neg Hx     Social History Social History   Tobacco Use  . Smoking status: Former Smoker    Last attempt to quit: 02/09/2002    Years since quitting: 16.4  . Smokeless tobacco: Never Used  Substance Use Topics  . Alcohol use: No  . Drug use: No     Allergies   Requip [ropinirole hcl]   Review of Systems Review of Systems  HENT: Positive for nosebleeds. Negative for congestion, dental problem, facial swelling, sneezing, sore throat and trouble swallowing.      Physical Exam Updated Vital Signs BP 127/87 (BP Location: Left Arm)   Pulse 78   Temp 97.7 F (36.5 C) (Oral)   SpO2 100%   Physical Exam HENT:     Head: Normocephalic and atraumatic.     Nose: Nose normal. No congestion or rhinorrhea.     Mouth/Throat:     Mouth: Mucous membranes are  moist.     Pharynx: No oropharyngeal exudate or posterior oropharyngeal erythema.      ED Treatments / Results  Labs (all labs ordered are listed, but only abnormal results are displayed) Labs Reviewed - No data to display  EKG None  Radiology No results found.  Procedures Procedures (including critical care time)  Medications Ordered in ED Medications  oxymetazoline (AFRIN) 0.05 % nasal spray 1 spray (has no administration in time range)     Initial Impression / Assessment and Plan / ED Course  I have reviewed the triage vital signs and the nursing notes.  Pertinent labs & imaging results that were available during my care of the patient were reviewed by me and considered in my medical decision making (see chart for details).        Michele Hall is a 53 year old female history of hypertension who presents to the ED after nosebleed.  Patient with normal vitals.  No fever.  Nosebleed has resolved upon arrival.  There is no active signs of bleeding in the nose or in the posterior pharynx.  There is no obvious source for bleeding in the nose or mouth.  Patient given Afrin to take home.  Educated about nosebleeds and steps to take if bleeding occurs again.  Patient is not on blood thinners.  Discharged from ED in good condition.  Given return precautions.  This chart was dictated using voice recognition software.  Despite best efforts to proofread,  errors can occur which can change the documentation meaning.    Final Clinical Impressions(s) / ED Diagnoses   Final diagnoses:  Nosebleed    ED Discharge Orders    None       [name], DO 08/06/18 3818

## 2018-08-06 NOTE — ED Triage Notes (Signed)
Pt from home; c/o that started last night nosebleed, left nare; pt also c/o HA; bleeding controlled at this time

## 2018-08-11 ENCOUNTER — Ambulatory Visit: Payer: BC Managed Care – PPO | Admitting: Family Medicine

## 2018-08-11 ENCOUNTER — Encounter: Payer: Self-pay | Admitting: Family Medicine

## 2018-08-11 VITALS — BP 96/62 | HR 92 | Temp 98.3°F | Ht 66.5 in | Wt 258.0 lb

## 2018-08-11 DIAGNOSIS — R040 Epistaxis: Secondary | ICD-10-CM

## 2018-08-11 DIAGNOSIS — J302 Other seasonal allergic rhinitis: Secondary | ICD-10-CM

## 2018-08-11 LAB — CBC WITH DIFFERENTIAL/PLATELET
BASOS ABS: 0.1 10*3/uL (ref 0.0–0.1)
Basophils Relative: 0.7 % (ref 0.0–3.0)
Eosinophils Absolute: 0.2 10*3/uL (ref 0.0–0.7)
Eosinophils Relative: 2.5 % (ref 0.0–5.0)
HEMATOCRIT: 41.6 % (ref 36.0–46.0)
Hemoglobin: 14.7 g/dL (ref 12.0–15.0)
LYMPHS ABS: 2.6 10*3/uL (ref 0.7–4.0)
LYMPHS PCT: 31.3 % (ref 12.0–46.0)
MCHC: 35.3 g/dL (ref 30.0–36.0)
MCV: 93.5 fl (ref 78.0–100.0)
MONO ABS: 0.9 10*3/uL (ref 0.1–1.0)
Monocytes Relative: 11.1 % (ref 3.0–12.0)
NEUTROS ABS: 4.5 10*3/uL (ref 1.4–7.7)
NEUTROS PCT: 54.4 % (ref 43.0–77.0)
PLATELETS: 238 10*3/uL (ref 150.0–400.0)
RBC: 4.45 Mil/uL (ref 3.87–5.11)
RDW: 12.6 % (ref 11.5–15.5)
WBC: 8.2 10*3/uL (ref 4.0–10.5)

## 2018-08-11 LAB — PROTIME-INR
INR: 1.1 ratio — ABNORMAL HIGH (ref 0.8–1.0)
PROTHROMBIN TIME: 12.4 s (ref 9.6–13.1)

## 2018-08-11 NOTE — Assessment & Plan Note (Signed)
Maybe contributing to recurrent epistaxsis, restart allegra.

## 2018-08-11 NOTE — Patient Instructions (Signed)
Please stop at the lab to have labs drawn.  Start back allegra daily.

## 2018-08-11 NOTE — Progress Notes (Signed)
Subjective:    Patient ID: Michele Hall. Escoto, female    DOB: 1965/09/29, 53 y.o.   MRN: [id number]  HPI    53 year old female presents for epistaxsis.  Started all of a sudden, lasted slowly over night, running down abck of her throat.  She was seen at ED on  2/29 BP 127/87  Vitals were normal, bleed was mild. Once at ER no current active bleeding Pt given Afrin. Bleeding recurred 4 days ago.  Using afrin made it worse.   No nasal dryness, no itching.  Not a nose picker, no sores.  She has not been using and flonase.    She does not have a history of nosebleeds, but has started having some since 05/2018. 3-4 since then.  No easy bruising, no gums bleeding  No family history of blood issues.  Not using aspirin, ibuprofen.   Social History /Family History/Past Medical History reviewed in detail and updated in EMR if needed. Blood pressure 96/62, pulse 92, temperature 98.3 F (36.8 C), temperature source Oral, height 5' 6.5" (1.689 m), weight 258 lb (117 kg), SpO2 96 %.  Review of Systems  Constitutional: Negative for fatigue and fever.  HENT: Negative for congestion.   Eyes: Negative for pain.  Respiratory: Negative for cough and shortness of breath.   Cardiovascular: Negative for chest pain, palpitations and leg swelling.  Gastrointestinal: Negative for abdominal pain.  Genitourinary: Negative for dysuria and vaginal bleeding.  Musculoskeletal: Negative for back pain.  Neurological: Negative for syncope, light-headedness and headaches.  Psychiatric/Behavioral: Negative for dysphoric mood.       Objective:   Physical Exam Constitutional:      General: She is not in acute distress.    Appearance: Normal appearance. She is well-developed. She is not ill-appearing or toxic-appearing.  HENT:     Head: Normocephalic.     Right Ear: Hearing, tympanic membrane, ear canal and external ear normal. Tympanic membrane is not erythematous, retracted or bulging.     Left Ear:  Hearing, tympanic membrane, ear canal and external ear normal. Tympanic membrane is not erythematous, retracted or bulging.     Nose: No mucosal edema or rhinorrhea.     Right Sinus: No maxillary sinus tenderness or frontal sinus tenderness.     Left Sinus: No maxillary sinus tenderness or frontal sinus tenderness.     Mouth/Throat:     Pharynx: Uvula midline.  Eyes:     General: Lids are normal. Lids are everted, no foreign bodies appreciated.     Conjunctiva/sclera: Conjunctivae normal.     Pupils: Pupils are equal, round, and reactive to light.  Neck:     Musculoskeletal: Normal range of motion and neck supple.     Thyroid: No thyroid mass or thyromegaly.     Vascular: No carotid bruit.     Trachea: Trachea normal.  Cardiovascular:     Rate and Rhythm: Normal rate and regular rhythm.     Pulses: Normal pulses.     Heart sounds: Normal heart sounds, S1 normal and S2 normal. No murmur. No friction rub. No gallop.   Pulmonary:     Effort: Pulmonary effort is normal. No tachypnea or respiratory distress.     Breath sounds: Normal breath sounds. No decreased breath sounds, wheezing, rhonchi or rales.  Abdominal:     General: Bowel sounds are normal.     Palpations: Abdomen is soft.     Tenderness: There is no abdominal tenderness.  Skin:  General: Skin is warm and dry.     Findings: No rash.  Neurological:     Mental Status: She is alert.  Psychiatric:        Mood and Affect: Mood is not anxious or depressed.        Speech: Speech normal.        Behavior: Behavior normal. Behavior is cooperative.        Thought Content: Thought content normal.        Judgment: Judgment normal.           Assessment & Plan:

## 2018-08-11 NOTE — Assessment & Plan Note (Signed)
Recurrent. Likely posterior source given anterior nasal exam normal. Eval with labs. Offered referral to ENT , but pt not interested at this time.

## 2018-08-13 ENCOUNTER — Other Ambulatory Visit: Payer: Self-pay | Admitting: Family Medicine

## 2018-08-13 DIAGNOSIS — R12 Heartburn: Secondary | ICD-10-CM

## 2018-08-13 DIAGNOSIS — R140 Abdominal distension (gaseous): Secondary | ICD-10-CM

## 2018-11-15 ENCOUNTER — Ambulatory Visit (INDEPENDENT_AMBULATORY_CARE_PROVIDER_SITE_OTHER): Payer: BC Managed Care – PPO | Admitting: Family Medicine

## 2018-11-15 ENCOUNTER — Encounter: Payer: Self-pay | Admitting: Family Medicine

## 2018-11-15 DIAGNOSIS — L239 Allergic contact dermatitis, unspecified cause: Secondary | ICD-10-CM | POA: Insufficient documentation

## 2018-11-15 DIAGNOSIS — L249 Irritant contact dermatitis, unspecified cause: Secondary | ICD-10-CM | POA: Diagnosis not present

## 2018-11-15 DIAGNOSIS — L259 Unspecified contact dermatitis, unspecified cause: Secondary | ICD-10-CM | POA: Insufficient documentation

## 2018-11-15 MED ORDER — TRIAMCINOLONE ACETONIDE 0.5 % EX CREA: 1.0000 "application " | TOPICAL_CREAM | Freq: Two times a day (BID) | CUTANEOUS | 0 refills | Status: DC

## 2018-11-15 NOTE — Patient Instructions (Signed)
Apply topical steroid twice daily to affected are.  Call if not improving as expected in 2 week, sooner if spreading.

## 2018-11-15 NOTE — Assessment & Plan Note (Signed)
Treat with topical steroid cream BID x 2 weeks, call for further eval if not improving as expected.

## 2018-11-15 NOTE — Progress Notes (Signed)
VIRTUAL VISIT Due to national recommendations of social distancing due to [place] 19, a virtual visit is felt to be most appropriate for this patient at this time.   I connected with the patient on 11/15/18 at  4:20 PM EDT by virtual telehealth platform and verified that I am speaking with the correct person using two identifiers.   I discussed the limitations, risks, security and privacy concerns of performing an evaluation and management service by  virtual telehealth platform and the availability of in person appointments. I also discussed with the patient that there may be a patient responsible charge related to this service. The patient expressed understanding and agreed to proceed.  Patient location: Home Provider Location: [facility] Participants: Lateef Juncaj Diona Browner and Quamesha Mullet. [place]   Chief Complaint  Patient presents with  . Rash    Lower left leg ?[place]ac    History of Present Illness: Rash  This is a new problem. The current episode started in the past 7 days. The problem has been gradually worsening since onset. The affected locations include the left lower leg. The rash is characterized by blistering, redness and itchiness (linear, itchy). She was exposed to nothing. Pertinent negatives include no anorexia, congestion, facial edema, fatigue, fever, shortness of breath or sore throat. Past treatments include anti-itch cream and topical steroids (essential oils). There is no history of allergies, asthma, eczema or varicella.     COVID 19 screen No recent travel or known exposure to COVID19 The patient denies respiratory symptoms of COVID 19 at this time.  The importance of social distancing was discussed today.   Review of Systems  Constitutional: Negative for fatigue and fever.  HENT: Negative for congestion and sore throat.   Respiratory: Negative for shortness of breath.   Gastrointestinal: Negative for anorexia.  Skin: Positive for rash.      Past Medical History:   Diagnosis Date  . Anal fissure - posterior 2015  . Anemia   . Gallstones   . GERD (gastroesophageal reflux disease)   . Hypertension   . IBS   . Kidney stones   . Morbid obesity ([place])     reports that she quit smoking about 16 years ago. She has never used smokeless tobacco. She reports that she does not drink alcohol or use drugs.   Current Outpatient Medications:  .  fexofenadine (ALLEGRA) 180 MG tablet, Take 180 mg by mouth daily., Disp: , Rfl:  .  losartan (COZAAR) 100 MG tablet, Take 50 mg by mouth daily. , Disp: , Rfl:  .  spironolactone (ALDACTONE) 50 MG tablet, TAKE 1 TABLET BY MOUTH EVERY DAY, Disp: 90 tablet, Rfl: 1 .  vitamin E 100 UNIT capsule, Take by mouth daily., Disp: , Rfl:    Observations/Objective: Height 5' 6.5" (1.689 m).  Physical Exam  Physical Exam Constitutional:      General: The patient is not in acute distress. Pulmonary:     Effort: Pulmonary effort is normal. No respiratory distress.  Neurological:     Mental Status: The patient is alert and oriented to person, place, and time.  Psychiatric:        Mood and Affect: Mood normal.        Behavior: Behavior normal.   Skin: erythematous papule and vesicles on right lowe leg   Assessment and Plan Contact dermatitis Treat with topical steroid cream BID x 2 weeks, call for further eval if not improving as expected.   I discussed the assessment and treatment plan  with the patient. The patient was provided an opportunity to ask questions and all were answered. The patient agreed with the plan and demonstrated an understanding of the instructions.   The patient was advised to call back or seek an in-person evaluation if the symptoms worsen or if the condition fails to improve as anticipated.     Eliezer Lofts, MD

## 2018-12-09 ENCOUNTER — Telehealth: Payer: Self-pay | Admitting: Family Medicine

## 2018-12-12 MED ORDER — LOSARTAN POTASSIUM 50 MG PO TABS: 50.0000 mg | ORAL_TABLET | Freq: Every day | ORAL | 0 refills | Status: DC

## 2018-12-12 NOTE — Telephone Encounter (Signed)
Please schedule CPE with fasting labs prior for Dr. Bedsole. 

## 2018-12-15 ENCOUNTER — Telehealth: Payer: Self-pay | Admitting: *Deleted

## 2018-12-15 NOTE — Telephone Encounter (Signed)
Michele Hall was here today seeing Dr. Diona Browner.  He states his wife had a virtual visit with Dr. Diona Browner on 11/15/2018 for sumac and was given a cream.  She was given 2 small tubes of Triamcinolone 0.5% cream,  which she has used all up.  Her legs still look terrible.  He was asking if Dr. Diona Browner could give her something else to treat rash.  Please advise.

## 2018-12-15 NOTE — Telephone Encounter (Signed)
Given I am now doubting that we have the correct diagnosis.Marland Kitchen she needs in office visit to evaluate to make sure treating correctly. Or if she would prefer.Payton Mccallum referral.

## 2018-12-16 ENCOUNTER — Ambulatory Visit: Payer: BC Managed Care – PPO | Admitting: Family Medicine

## 2018-12-16 ENCOUNTER — Encounter: Payer: Self-pay | Admitting: Family Medicine

## 2018-12-16 ENCOUNTER — Other Ambulatory Visit: Payer: Self-pay

## 2018-12-16 VITALS — BP 100/60 | HR 97 | Temp 98.0°F | Ht 66.5 in | Wt 265.8 lb

## 2018-12-16 DIAGNOSIS — R21 Rash and other nonspecific skin eruption: Secondary | ICD-10-CM | POA: Diagnosis not present

## 2018-12-16 MED ORDER — CEPHALEXIN 500 MG PO CAPS: 500.0000 mg | ORAL_CAPSULE | Freq: Three times a day (TID) | ORAL | 0 refills | Status: DC

## 2018-12-16 MED ORDER — PREDNISONE 20 MG PO TABS: ORAL_TABLET | ORAL | 0 refills | Status: DC

## 2018-12-16 NOTE — Telephone Encounter (Signed)
8/17 labs 8/20 cpx Pt aware

## 2018-12-16 NOTE — Telephone Encounter (Signed)
Appointment scheduled 12/16/2018 at 3:40 pm.

## 2018-12-16 NOTE — Progress Notes (Signed)
Chief Complaint  Patient presents with  . Rash    History of Present Illness: HPI  53 year old  Female presents for continued rash... initially felt plant dermaititis.Marland Kitchen treated with triamcinolone 0.5 % BID... did not help at all. ONgoing now for 1 month.    Rash has not improved much.. very itchy. Appears on bilateral anterior calf, some on upper thigh... she has been scratching it a lot  No blisters.  Using oatmeal soap, tea tree Through away razor. She has sprayed it with chlorox. Applied hand sanitizer.  COVID 19 screen No recent travel or known exposure to COVID19 The patient denies respiratory symptoms of COVID 19 at this time.  The importance of social distancing was discussed today.   Review of Systems  Constitutional: Negative for chills and fever.  HENT: Negative for congestion and ear pain.   Eyes: Negative for pain and redness.  Respiratory: Negative for cough and shortness of breath.   Cardiovascular: Negative for chest pain, palpitations and leg swelling.  Gastrointestinal: Negative for abdominal pain, blood in stool, constipation, diarrhea, nausea and vomiting.  Genitourinary: Negative for dysuria.  Musculoskeletal: Negative for falls and myalgias.  Skin: Positive for rash.  Neurological: Negative for dizziness.  Psychiatric/Behavioral: Negative for depression. The patient is not nervous/anxious.       Past Medical History:  Diagnosis Date  . Anal fissure - posterior 2015  . Anemia   . Gallstones   . GERD (gastroesophageal reflux disease)   . Hypertension   . IBS   . Kidney stones   . Morbid obesity ([place])     reports that she quit smoking about 16 years ago. She has never used smokeless tobacco. She reports that she does not drink alcohol or use drugs.   Current Outpatient Medications:  .  fexofenadine (ALLEGRA) 180 MG tablet, Take 180 mg by mouth daily., Disp: , Rfl:  .  losartan (COZAAR) 50 MG tablet, Take 1 tablet (50 mg total) by mouth daily.,  Disp: 90 tablet, Rfl: 0 .  Probiotic Product (PROBIOTIC PO), Take 1 tablet by mouth daily., Disp: , Rfl:  .  spironolactone (ALDACTONE) 50 MG tablet, TAKE 1 TABLET BY MOUTH EVERY DAY, Disp: 90 tablet, Rfl: 0 .  vitamin E 100 UNIT capsule, Take by mouth daily., Disp: , Rfl:    Observations/Objective: Blood pressure 100/60, pulse 97, temperature 98 F (36.7 C), temperature source Oral, height 5' 6.5" (1.689 m), weight 265 lb 12 oz (120.5 kg), SpO2 97 %.  Physical Exam Constitutional:      General: She is not in acute distress.    Appearance: Normal appearance. She is well-developed. She is not ill-appearing or toxic-appearing.  HENT:     Head: Normocephalic.     Right Ear: Hearing, tympanic membrane, ear canal and external ear normal. Tympanic membrane is not erythematous, retracted or bulging.     Left Ear: Hearing, tympanic membrane, ear canal and external ear normal. Tympanic membrane is not erythematous, retracted or bulging.     Nose: No mucosal edema or rhinorrhea.     Right Sinus: No maxillary sinus tenderness or frontal sinus tenderness.     Left Sinus: No maxillary sinus tenderness or frontal sinus tenderness.     Mouth/Throat:     Pharynx: Uvula midline.  Eyes:     General: Lids are normal. Lids are everted, no foreign bodies appreciated.     Conjunctiva/sclera: Conjunctivae normal.     Pupils: Pupils are equal, round, and reactive to light.  Neck:     Musculoskeletal: Normal range of motion and neck supple.     Thyroid: No thyroid mass or thyromegaly.     Vascular: No carotid bruit.     Trachea: Trachea normal.  Cardiovascular:     Rate and Rhythm: Normal rate and regular rhythm.     Pulses: Normal pulses.     Heart sounds: Normal heart sounds, S1 normal and S2 normal. No murmur. No friction rub. No gallop.   Pulmonary:     Effort: Pulmonary effort is normal. No tachypnea or respiratory distress.     Breath sounds: Normal breath sounds. No decreased breath sounds,  wheezing, rhonchi or rales.  Abdominal:     General: Bowel sounds are normal.     Palpations: Abdomen is soft.     Tenderness: There is no abdominal tenderness.  Skin:    General: Skin is warm and dry.     Findings: No rash.     Comments: Erythematous excoriations and papule on bilateral legs with surrounding erythema.  Neurological:     Mental Status: She is alert.  Psychiatric:        Mood and Affect: Mood is not anxious or depressed.        Speech: Speech normal.        Behavior: Behavior normal. Behavior is cooperative.        Thought Content: Thought content normal.        Judgment: Judgment normal.      Assessment and Plan  Rash Possible bacterial superinfection. Treat with oral antibiotics and prednisone taper.     Eliezer Lofts, MD

## 2019-01-09 ENCOUNTER — Other Ambulatory Visit: Payer: Self-pay | Admitting: Family Medicine

## 2019-01-22 ENCOUNTER — Telehealth: Payer: Self-pay | Admitting: Family Medicine

## 2019-01-22 DIAGNOSIS — E538 Deficiency of other specified B group vitamins: Secondary | ICD-10-CM

## 2019-01-22 DIAGNOSIS — E7800 Pure hypercholesterolemia, unspecified: Secondary | ICD-10-CM

## 2019-01-22 DIAGNOSIS — Z0000 Encounter for general adult medical examination without abnormal findings: Secondary | ICD-10-CM

## 2019-01-22 DIAGNOSIS — I10 Essential (primary) hypertension: Secondary | ICD-10-CM

## 2019-01-22 DIAGNOSIS — R21 Rash and other nonspecific skin eruption: Secondary | ICD-10-CM | POA: Insufficient documentation

## 2019-01-22 NOTE — Assessment & Plan Note (Signed)
Possible bacterial superinfection. Treat with oral antibiotics and prednisone taper.

## 2019-01-22 NOTE — Telephone Encounter (Signed)
-----   Message from Ellamae Sia sent at 01/16/2019 12:34 PM EDT ----- Regarding: Lab orders for Monday 8.17.20 Patient is scheduled for CPX labs, please order future labs, Thanks , Karna Christmas

## 2019-01-23 ENCOUNTER — Other Ambulatory Visit (INDEPENDENT_AMBULATORY_CARE_PROVIDER_SITE_OTHER): Payer: BC Managed Care – PPO

## 2019-01-23 DIAGNOSIS — I10 Essential (primary) hypertension: Secondary | ICD-10-CM | POA: Diagnosis not present

## 2019-01-23 DIAGNOSIS — E7800 Pure hypercholesterolemia, unspecified: Secondary | ICD-10-CM

## 2019-01-23 DIAGNOSIS — E538 Deficiency of other specified B group vitamins: Secondary | ICD-10-CM

## 2019-01-23 LAB — COMPREHENSIVE METABOLIC PANEL
ALT: 25 U/L (ref 0–35)
AST: 22 U/L (ref 0–37)
Albumin: 4.2 g/dL (ref 3.5–5.2)
Alkaline Phosphatase: 47 U/L (ref 39–117)
BUN: 12 mg/dL (ref 6–23)
CO2: 25 mEq/L (ref 19–32)
Calcium: 9 mg/dL (ref 8.4–10.5)
Chloride: 103 mEq/L (ref 96–112)
Creatinine, Ser: 0.78 mg/dL (ref 0.40–1.20)
GFR: 77.55 mL/min (ref >60.00)
Glucose, Bld: 103 mg/dL — ABNORMAL HIGH (ref 70–99)
Potassium: 3.8 mEq/L (ref 3.5–5.1)
Sodium: 138 mEq/L (ref 135–145)
Total Bilirubin: 0.6 mg/dL (ref 0.2–1.2)
Total Protein: 7.4 g/dL (ref 6.0–8.3)

## 2019-01-23 LAB — LDL CHOLESTEROL, DIRECT: Direct LDL: 78 mg/dL

## 2019-01-23 LAB — CBC WITH DIFFERENTIAL/PLATELET
Basophils Absolute: 0 10*3/uL (ref 0.0–0.1)
Basophils Relative: 0.3 % (ref 0.0–3.0)
Eosinophils Absolute: 0.1 10*3/uL (ref 0.0–0.7)
Eosinophils Relative: 2.7 % (ref 0.0–5.0)
HCT: 40.2 % (ref 36.0–46.0)
Hemoglobin: 14.4 g/dL (ref 12.0–15.0)
Lymphocytes Relative: 36.6 % (ref 12.0–46.0)
Lymphs Abs: 1.7 10*3/uL (ref 0.7–4.0)
MCHC: 36 g/dL (ref 30.0–36.0)
MCV: 97.5 fl (ref 78.0–100.0)
Monocytes Absolute: 0.4 10*3/uL (ref 0.1–1.0)
Monocytes Relative: 9.6 % (ref 3.0–12.0)
Neutro Abs: 2.3 10*3/uL (ref 1.4–7.7)
Neutrophils Relative %: 50.8 % (ref 43.0–77.0)
Platelets: 162 10*3/uL (ref 150.0–400.0)
RBC: 4.12 Mil/uL (ref 3.87–5.11)
RDW: 12.6 % (ref 11.5–15.5)
WBC: 4.5 10*3/uL (ref 4.0–10.5)

## 2019-01-23 LAB — LIPID PANEL
Cholesterol: 140 mg/dL (ref 0–200)
HDL: 31.6 mg/dL — ABNORMAL LOW (ref >39.00)
NonHDL: 108.63
Total CHOL/HDL Ratio: 4
Triglycerides: 257 mg/dL — ABNORMAL HIGH (ref 0.0–149.0)
VLDL: 51.4 mg/dL — ABNORMAL HIGH (ref 0.0–40.0)

## 2019-01-23 LAB — TSH: TSH: 2.33 u[IU]/mL (ref 0.35–4.50)

## 2019-01-23 LAB — VITAMIN B12: Vitamin B-12: 203 pg/mL — ABNORMAL LOW (ref 211–911)

## 2019-01-24 NOTE — Progress Notes (Signed)
No critical labs need to be addressed urgently. We will discuss labs in detail at upcoming office visit.   

## 2019-01-26 ENCOUNTER — Encounter: Payer: BC Managed Care – PPO | Admitting: Family Medicine

## 2019-01-26 ENCOUNTER — Encounter: Payer: Self-pay | Admitting: Family Medicine

## 2019-01-26 ENCOUNTER — Ambulatory Visit (INDEPENDENT_AMBULATORY_CARE_PROVIDER_SITE_OTHER): Payer: BC Managed Care – PPO | Admitting: Family Medicine

## 2019-01-26 DIAGNOSIS — R21 Rash and other nonspecific skin eruption: Secondary | ICD-10-CM | POA: Diagnosis not present

## 2019-01-26 DIAGNOSIS — I10 Essential (primary) hypertension: Secondary | ICD-10-CM | POA: Diagnosis not present

## 2019-01-26 DIAGNOSIS — E7800 Pure hypercholesterolemia, unspecified: Secondary | ICD-10-CM | POA: Diagnosis not present

## 2019-01-26 DIAGNOSIS — Z0000 Encounter for general adult medical examination without abnormal findings: Secondary | ICD-10-CM | POA: Diagnosis not present

## 2019-01-26 MED ORDER — PREDNISONE 10 MG PO TABS: ORAL_TABLET | ORAL | 0 refills | Status: DC

## 2019-01-26 NOTE — Progress Notes (Signed)
VIRTUAL VISIT Due to national recommendations of social distancing due to [place] 19, a virtual visit is felt to be most appropriate for this patient at this time.   I connected with the patient on 01/26/19 at 11:40 AM EDT by virtual telehealth platform and verified that I am speaking with the correct person using two identifiers.   I discussed the limitations, risks, security and privacy concerns of performing an evaluation and management service by  virtual telehealth platform and the availability of in person appointments. I also discussed with the patient that there may be a patient responsible charge related to this service. The patient expressed understanding and agreed to proceed.  Patient location: Home Provider Location: [facility] Participants: Amy Diona Browner and Milliana Reddoch. [place]   Chief Complaint  Patient presents with  . Annual Exam    History of Present Illness: The patient is here for annual wellness exam and preventative care.    Rash on legs has improved with antibiotics and steriod. Now rash has returned and spread to both legs and now right upper arm.  Itchy. No fever, no SOB. Mild cough at times.  Hypertension: Good control previously on spironolactone and losartan   BP Readings from Last 3 Encounters:  12/16/18 100/60  08/11/18 96/62  08/06/18 127/87  Using medication without problems or lightheadedness: none Chest pain with exertion:none Edema:none Short of breath:none Average home BPs: Other issues:  Elevated Cholesterol:  LDL at goal with diet. Lab Results  Component Value Date   CHOL 140 01/23/2019   HDL 31.60 (L) 01/23/2019   LDLCALC 82 10/29/2017   LDLDIRECT 78.0 01/23/2019   TRIG 257.0 (H) 01/23/2019   CHOLHDL 4 01/23/2019  Using medications without problems: Muscle aches:  Diet compliance: moderate Exercise: minimal Other complaints:   Wt Readings from Last 3 Encounters:  12/16/18 265 lb 12 oz (120.5 kg)  08/11/18 258 lb (117 kg)   08/06/18 250 lb (113.4 kg)    COVID 19 screen No recent travel or known exposure to [place]  The importance of social distancing was discussed today.   Review of Systems  Constitutional: Positive for malaise/fatigue. Negative for chills and fever.  HENT: Negative for congestion and ear pain.   Eyes: Negative for pain and redness.  Respiratory: Negative for cough and shortness of breath.   Cardiovascular: Negative for chest pain, palpitations and leg swelling.  Gastrointestinal: Negative for abdominal pain, blood in stool, constipation, diarrhea, nausea and vomiting.  Genitourinary: Negative for dysuria.  Musculoskeletal: Negative for falls and myalgias.  Skin: Positive for rash.  Neurological: Negative for dizziness.  Psychiatric/Behavioral: Negative for depression. The patient is not nervous/anxious.       Past Medical History:  Diagnosis Date  . Anal fissure - posterior 2015  . Anemia   . Gallstones   . GERD (gastroesophageal reflux disease)   . Hypertension   . IBS   . Kidney stones   . Morbid obesity ([place])     reports that she quit smoking about 16 years ago. She has never used smokeless tobacco. She reports that she does not drink alcohol or use drugs.   Current Outpatient Medications:  .  fexofenadine (ALLEGRA) 180 MG tablet, TAKE 1 TABLET BY MOUTH EVERY DAY, Disp: 90 tablet, Rfl: 3 .  losartan (COZAAR) 50 MG tablet, Take 1 tablet (50 mg total) by mouth daily., Disp: 90 tablet, Rfl: 0 .  spironolactone (ALDACTONE) 50 MG tablet, TAKE 1 TABLET BY MOUTH EVERY DAY, Disp: 90 tablet, Rfl:  0 .  vitamin E 100 UNIT capsule, Take by mouth daily., Disp: , Rfl:    Observations/Objective: There were no vitals taken for this visit.  Physical Exam  Physical Exam Constitutional:      General: The patient is not in acute distress. Pulmonary:     Effort: Pulmonary effort is normal. No respiratory distress.  Neurological:     Mental Status: The patient is alert and oriented to  person, place, and time.  Psychiatric:        Mood and Affect: Mood normal.        Behavior: Behavior normal.  Skin: Erythematous papular rash on legs. Assessment and Plan The patient's preventative maintenance and recommended screening tests for an annual wellness exam were reviewed in full today. Brought up to date unless services declined.  Counselled on the importance of diet, exercise, and its role in overall health and mortality. The patient's FH and SH was reviewed, including their home life, tobacco status, and drug and alcohol status.   Vaccines:Uptodate withTdap, refused flu Pap/DVE:nml 2019.. repeat in 5 years. Mammo: last mamm in 2015.. due Bone Density:not indicated Colon:GI Dr. Carlean Purl 09/2013 nml colonoscopy repeat in 2025 Smoking Status: former smoker QUIT 2003 HIV screen:refused  No ETOH   Essential hypertension, benign Well controlled. Continue current medication. Encouraged exercise, weight loss, healthy eating habits.   Hyperlipidemia Diet  Controlled... work on low fat det and exercise to raise HDl and lower trigs.  Rash Improved on prednisone and antibiotics but then returned. Very ichy.  Refer to DERMfor eval. Repeat longer lower dose steroid given most likely allergic reaction.   I discussed the assessment and treatment plan with the patient. The patient was provided an opportunity to ask questions and all were answered. The patient agreed with the plan and demonstrated an understanding of the instructions.   The patient was advised to call back or seek an in-person evaluation if the symptoms worsen or if the condition fails to improve as anticipated.     Eliezer Lofts, MD

## 2019-01-26 NOTE — Patient Instructions (Addendum)
Take B12 1000 mcg daily. Call to set up mammogram on your own.   Call to set up next year wellness with fasting labs prior.   Preventive Care 38-52 Years Old, Female Preventive care refers to visits with your health care provider and lifestyle choices that can promote health and wellness. This includes:  A yearly physical exam. This may also be called an annual well check.  Regular dental visits and eye exams.  Immunizations.  Screening for certain conditions.  Healthy lifestyle choices, such as eating a healthy diet, getting regular exercise, not using drugs or products that contain nicotine and tobacco, and limiting alcohol use. What can I expect for my preventive care visit? Physical exam Your health care provider will check your:  Height and weight. This may be used to calculate body mass index (BMI), which tells if you are at a healthy weight.  Heart rate and blood pressure.  Skin for abnormal spots. Counseling Your health care provider may ask you questions about your:  Alcohol, tobacco, and drug use.  Emotional well-being.  Home and relationship well-being.  Sexual activity.  Eating habits.  Work and work Statistician.  Method of birth control.  Menstrual cycle.  Pregnancy history. What immunizations do I need?  Influenza (flu) vaccine  This is recommended every year. Tetanus, diphtheria, and pertussis (Tdap) vaccine  You may need a Td booster every 10 years. Varicella (chickenpox) vaccine  You may need this if you have not been vaccinated. Zoster (shingles) vaccine  You may need this after age 54. Measles, mumps, and rubella (MMR) vaccine  You may need at least one dose of MMR if you were born in 1957 or later. You may also need a second dose. Pneumococcal conjugate (PCV13) vaccine  You may need this if you have certain conditions and were not previously vaccinated. Pneumococcal polysaccharide (PPSV23) vaccine  You may need one or two doses  if you smoke cigarettes or if you have certain conditions. Meningococcal conjugate (MenACWY) vaccine  You may need this if you have certain conditions. Hepatitis A vaccine  You may need this if you have certain conditions or if you travel or work in places where you may be exposed to hepatitis A. Hepatitis B vaccine  You may need this if you have certain conditions or if you travel or work in places where you may be exposed to hepatitis B. Haemophilus influenzae type b (Hib) vaccine  You may need this if you have certain conditions. Human papillomavirus (HPV) vaccine  If recommended by your health care provider, you may need three doses over 6 months. You may receive vaccines as individual doses or as more than one vaccine together in one shot (combination vaccines). Talk with your health care provider about the risks and benefits of combination vaccines. What tests do I need? Blood tests  Lipid and cholesterol levels. These may be checked every 5 years, or more frequently if you are over 30 years old.  Hepatitis C test.  Hepatitis B test. Screening  Lung cancer screening. You may have this screening every year starting at age 77 if you have a 30-pack-year history of smoking and currently smoke or have quit within the past 15 years.  Colorectal cancer screening. All adults should have this screening starting at age 62 and continuing until age 3. Your health care provider may recommend screening at age 43 if you are at increased risk. You will have tests every 1-10 years, depending on your results and the type  of screening test.  Diabetes screening. This is done by checking your blood sugar (glucose) after you have not eaten for a while (fasting). You may have this done every 1-3 years.  Mammogram. This may be done every 1-2 years. Talk with your health care provider about when you should start having regular mammograms. This may depend on whether you have a family history of breast  cancer.  BRCA-related cancer screening. This may be done if you have a family history of breast, ovarian, tubal, or peritoneal cancers.  Pelvic exam and Pap test. This may be done every 3 years starting at age 76. Starting at age 44, this may be done every 5 years if you have a Pap test in combination with an HPV test. Other tests  Sexually transmitted disease (STD) testing.  Bone density scan. This is done to screen for osteoporosis. You may have this scan if you are at high risk for osteoporosis. Follow these instructions at home: Eating and drinking  Eat a diet that includes fresh fruits and vegetables, whole grains, lean protein, and low-fat dairy.  Take vitamin and mineral supplements as recommended by your health care provider.  Do not drink alcohol if: ? Your health care provider tells you not to drink. ? You are pregnant, may be pregnant, or are planning to become pregnant.  If you drink alcohol: ? Limit how much you have to 0-1 drink a day. ? Be aware of how much alcohol is in your drink. In the [place] one drink equals one 12 oz bottle of beer (355 mL), one 5 oz glass of wine (148 mL), or one 1 oz glass of hard liquor (44 mL). Lifestyle  Take daily care of your teeth and gums.  Stay active. Exercise for at least 30 minutes on 5 or more days each week.  Do not use any products that contain nicotine or tobacco, such as cigarettes, e-cigarettes, and chewing tobacco. If you need help quitting, ask your health care provider.  If you are sexually active, practice safe sex. Use a condom or other form of birth control (contraception) in order to prevent pregnancy and STIs (sexually transmitted infections).  If told by your health care provider, take low-dose aspirin daily starting at age 79. What's next?  Visit your health care provider once a year for a well check visit.  Ask your health care provider how often you should have your eyes and teeth checked.  Stay up to date  on all vaccines. This information is not intended to replace advice given to you by your health care provider. Make sure you discuss any questions you have with your health care provider. Document Released: 06/21/2015 Document Revised: 02/03/2018 Document Reviewed: 02/03/2018 Elsevier Patient Education  2020 [employer].

## 2019-01-26 NOTE — Assessment & Plan Note (Signed)
Well controlled. Continue current medication. Encouraged exercise, weight loss, healthy eating habits.  

## 2019-01-26 NOTE — Assessment & Plan Note (Signed)
Improved on prednisone and antibiotics but then returned. Very ichy.  Refer to DERMfor eval. Repeat longer lower dose steroid given most likely allergic reaction.

## 2019-01-26 NOTE — Assessment & Plan Note (Signed)
Diet  Controlled... work on low fat det and exercise to raise HDl and lower trigs.

## 2019-03-09 ENCOUNTER — Other Ambulatory Visit: Payer: Self-pay | Admitting: Family Medicine

## 2019-03-17 ENCOUNTER — Other Ambulatory Visit: Payer: Self-pay | Admitting: Family Medicine

## 2019-04-11 ENCOUNTER — Other Ambulatory Visit: Payer: Self-pay

## 2019-04-11 ENCOUNTER — Ambulatory Visit: Payer: BC Managed Care – PPO | Admitting: Family Medicine

## 2019-04-11 VITALS — BP 100/78 | HR 92 | Temp 98.0°F | Ht 66.5 in | Wt 262.5 lb

## 2019-04-11 DIAGNOSIS — H00015 Hordeolum externum left lower eyelid: Secondary | ICD-10-CM | POA: Diagnosis not present

## 2019-04-11 MED ORDER — BETAMETHASONE DIPROPIONATE 0.05 % EX CREA: TOPICAL_CREAM | Freq: Two times a day (BID) | CUTANEOUS | 0 refills | Status: DC

## 2019-04-11 MED ORDER — ERYTHROMYCIN 5 MG/GM OP OINT: 1.0000 "application " | TOPICAL_OINTMENT | Freq: Every day | OPHTHALMIC | 0 refills | Status: AC

## 2019-04-11 NOTE — Progress Notes (Signed)
Chief Complaint  Patient presents with  . Stye    left eye    History of Present Illness: HPI    53 year old female presents with sore area on lower lid on left.  Area opened, ivory discharge went in eye.  Occ haziness in eye.. vision clear.  Area sore, red, no swelling.  No fever.   No flu like; symptoms.   COVID 19 screen No recent travel or known exposure to COVID19 The patient denies respiratory symptoms of COVID 19 at this time.  The importance of social distancing was discussed today.   Review of Systems  Constitutional: Negative for chills and fever.  HENT: Negative for congestion and ear pain.   Eyes: Negative for pain and redness.  Respiratory: Negative for cough and shortness of breath.   Cardiovascular: Negative for chest pain, palpitations and leg swelling.  Gastrointestinal: Negative for abdominal pain, blood in stool, constipation, diarrhea, nausea and vomiting.  Genitourinary: Negative for dysuria.  Musculoskeletal: Negative for falls and myalgias.  Skin: Negative for rash.  Neurological: Negative for dizziness.  Psychiatric/Behavioral: Negative for depression. The patient is not nervous/anxious.       Past Medical History:  Diagnosis Date  . Anal fissure - posterior 2015  . Anemia   . Gallstones   . GERD (gastroesophageal reflux disease)   . Hypertension   . IBS   . Kidney stones   . Morbid obesity ([place])     reports that she quit smoking about 17 years ago. She has never used smokeless tobacco. She reports that she does not drink alcohol or use drugs.   Current Outpatient Medications:  .  fexofenadine (ALLEGRA) 180 MG tablet, TAKE 1 TABLET BY MOUTH EVERY DAY, Disp: 90 tablet, Rfl: 3 .  losartan (COZAAR) 50 MG tablet, TAKE 1 TABLET BY MOUTH EVERY DAY, Disp: 90 tablet, Rfl: 1 .  spironolactone (ALDACTONE) 50 MG tablet, TAKE 1 TABLET BY MOUTH EVERY DAY, Disp: 90 tablet, Rfl: 1 .  predniSONE (DELTASONE) 10 MG tablet, 3 tabs x 5 day then 2 tabs x 5  day then 1 tab x 5 days, Disp: 15 tablet, Rfl: 0 .  vitamin E 100 UNIT capsule, Take by mouth daily., Disp: , Rfl:    Observations/Objective: Blood pressure 100/78, pulse 92, temperature 98 F (36.7 C), temperature source Temporal, height 5' 6.5" (1.689 m), weight 262 lb 8 oz (119.1 kg), SpO2 97 %.  Physical Exam Constitutional:      General: She is not in acute distress.    Appearance: Normal appearance. She is well-developed. She is obese. She is not ill-appearing or toxic-appearing.  HENT:     Head: Normocephalic.     Right Ear: Hearing, tympanic membrane, ear canal and external ear normal. Tympanic membrane is not erythematous, retracted or bulging.     Left Ear: Hearing, tympanic membrane, ear canal and external ear normal. Tympanic membrane is not erythematous, retracted or bulging.     Nose: No mucosal edema or rhinorrhea.     Right Sinus: No maxillary sinus tenderness or frontal sinus tenderness.     Left Sinus: No maxillary sinus tenderness or frontal sinus tenderness.     Mouth/Throat:     Pharynx: Uvula midline.  Eyes:     General: Lids are normal. Lids are everted, no foreign bodies appreciated.        Right eye: No discharge or hordeolum.        Left eye: Hordeolum present.No discharge.  Extraocular Movements:     Right eye: Normal extraocular motion.     Left eye: Normal extraocular motion.     Conjunctiva/sclera: Conjunctivae normal.     Pupils: Pupils are equal, round, and reactive to light.  Neck:     Thyroid: No thyroid mass or thyromegaly.     Vascular: No carotid bruit.     Trachea: Trachea normal.  Cardiovascular:     Rate and Rhythm: Normal rate and regular rhythm.     Pulses: Normal pulses.     Heart sounds: Normal heart sounds, S1 normal and S2 normal. No murmur. No friction rub. No gallop.   Pulmonary:     Effort: Pulmonary effort is normal. No tachypnea or respiratory distress.     Breath sounds: Normal breath sounds. No decreased breath sounds,  wheezing, rhonchi or rales.  Abdominal:     General: Bowel sounds are normal.     Palpations: Abdomen is soft.     Tenderness: There is no abdominal tenderness.  Musculoskeletal:     Cervical back: Normal range of motion and neck supple.  Skin:    General: Skin is warm and dry.     Findings: No rash.  Neurological:     Mental Status: She is alert.  Psychiatric:        Mood and Affect: Mood is not anxious or depressed.        Speech: Speech normal.        Behavior: Behavior normal. Behavior is cooperative.        Thought Content: Thought content normal.        Judgment: Judgment normal.      Assessment and Plan      Eliezer Lofts, MD

## 2019-04-11 NOTE — Patient Instructions (Signed)
Apply topical erythromycin gel to eye.  Apply betamethasone cream to lower leg twice daily x 2 weeks.Marland Kitchen if rash not resolving.. make an appt for a punch biopsy.

## 2019-04-24 ENCOUNTER — Encounter: Payer: Self-pay | Admitting: Internal Medicine

## 2019-04-27 ENCOUNTER — Other Ambulatory Visit: Payer: Self-pay | Admitting: Family Medicine

## 2019-04-27 ENCOUNTER — Ambulatory Visit: Payer: BC Managed Care – PPO | Admitting: Family Medicine

## 2019-04-27 ENCOUNTER — Encounter: Payer: Self-pay | Admitting: Family Medicine

## 2019-04-27 ENCOUNTER — Other Ambulatory Visit: Payer: Self-pay

## 2019-04-27 VITALS — BP 118/70 | HR 113 | Temp 98.2°F | Ht 66.5 in | Wt 262.1 lb

## 2019-04-27 DIAGNOSIS — K602 Anal fissure, unspecified: Secondary | ICD-10-CM | POA: Diagnosis not present

## 2019-04-27 DIAGNOSIS — L299 Pruritus, unspecified: Secondary | ICD-10-CM | POA: Diagnosis not present

## 2019-04-27 DIAGNOSIS — K601 Chronic anal fissure: Secondary | ICD-10-CM | POA: Insufficient documentation

## 2019-04-27 DIAGNOSIS — R21 Rash and other nonspecific skin eruption: Secondary | ICD-10-CM

## 2019-04-27 NOTE — Assessment & Plan Note (Signed)
Punch biopsy performed to assist with diagnosis.  Punch biopsy  Meds, vitals, and allergies reviewed.   Indication: rash   Location: left lower leg  Informed consent obtained.  Pt aware of risks not limited to but including infection, bleeding, damage to near by organs.  Prep: etoh/betadine  Anesthesia: 2%lidocaine with epi, good effect  Punch made with _4___mm punch  Minimal oozing, controlled with silver nitrate  Tolerated well  Routine postprocedure instructions d/w pt- keep area clean and bandaged, follow up if concerns/spreading erythema/pain.  Sent sample for pathology.

## 2019-04-27 NOTE — Patient Instructions (Addendum)
Start Eucerin/Cetaphiul cream twice daily  Use Cetaphil soap.

## 2019-04-27 NOTE — Assessment & Plan Note (Signed)
Has GI follow up. Diltiazem cream given for symptoms.

## 2019-04-27 NOTE — Progress Notes (Signed)
Chief Complaint  Patient presents with  . Pruritus    C/o itching     History of Present Illness: HPI  53 year old female with recent intermittent rash on legs presents now with itching all over.  No new rash but scratches on stomach.  No new lotions, no new detergents. No new meds.  Skin is dry.  Recently treated persistent leg rash with  Betamethasone cream. Did not improve rash much but helped with leg itching.   Has tried stopping the losartan.. no change in skins.  Has rectal fissure for years.. it has never gone away. Very bothersome, pt is tearful disucsing it Given nitroglycerin cream... has follow up with GI 12/1.  no constipation, no straining... using miralax.  Using antibacterial soap on skin.  TSH, cbc, CMET nml in 01/2019.   COVID 19 screen No recent travel or known exposure to COVID19 The patient denies respiratory symptoms of COVID 19 at this time.  The importance of social distancing was discussed today.   Review of Systems  Constitutional: Negative for chills and fever.  HENT: Negative for congestion and ear pain.   Eyes: Negative for pain and redness.  Respiratory: Negative for cough and shortness of breath.   Cardiovascular: Negative for chest pain, palpitations and leg swelling.  Gastrointestinal: Negative for abdominal pain, blood in stool, constipation, diarrhea, nausea and vomiting.  Genitourinary: Negative for dysuria.  Musculoskeletal: Negative for falls and myalgias.  Skin: Negative for rash.  Neurological: Negative for dizziness.  Psychiatric/Behavioral: Negative for depression. The patient is not nervous/anxious.       Past Medical History:  Diagnosis Date  . Anal fissure - posterior 2015  . Anemia   . Gallstones   . GERD (gastroesophageal reflux disease)   . Hypertension   . IBS   . Kidney stones   . Morbid obesity ([place])     reports that she quit smoking about 17 years ago. She has never used smokeless tobacco. She reports that she  does not drink alcohol or use drugs.   Current Outpatient Medications:  .  betamethasone dipropionate 0.05 % cream, Apply topically 2 (two) times daily., Disp: 30 g, Rfl: 0 .  fexofenadine (ALLEGRA) 180 MG tablet, TAKE 1 TABLET BY MOUTH EVERY DAY, Disp: 90 tablet, Rfl: 3 .  losartan (COZAAR) 50 MG tablet, TAKE 1 TABLET BY MOUTH EVERY DAY, Disp: 90 tablet, Rfl: 1 .  predniSONE (DELTASONE) 10 MG tablet, 3 tabs x 5 day then 2 tabs x 5 day then 1 tab x 5 days, Disp: 15 tablet, Rfl: 0 .  spironolactone (ALDACTONE) 50 MG tablet, TAKE 1 TABLET BY MOUTH EVERY DAY, Disp: 90 tablet, Rfl: 1 .  vitamin E 100 UNIT capsule, Take by mouth daily., Disp: , Rfl:    Observations/Objective: Temperature 98.2 F (36.8 C), temperature source Temporal, height 5' 6.5" (1.689 m), weight 262 lb 1 oz (118.9 kg).  BP Readings from Last 3 Encounters:  04/27/19 118/70  04/11/19 100/78  12/16/18 100/60     Physical Exam Constitutional:      General: She is not in acute distress.    Appearance: Normal appearance. She is well-developed. She is not ill-appearing or toxic-appearing.  HENT:     Head: Normocephalic.     Right Ear: Hearing, tympanic membrane, ear canal and external ear normal. Tympanic membrane is not erythematous, retracted or bulging.     Left Ear: Hearing, tympanic membrane, ear canal and external ear normal. Tympanic membrane is not erythematous, retracted  or bulging.     Nose: No mucosal edema or rhinorrhea.     Right Sinus: No maxillary sinus tenderness or frontal sinus tenderness.     Left Sinus: No maxillary sinus tenderness or frontal sinus tenderness.     Mouth/Throat:     Pharynx: Uvula midline.  Eyes:     General: Lids are normal. Lids are everted, no foreign bodies appreciated.     Conjunctiva/sclera: Conjunctivae normal.     Pupils: Pupils are equal, round, and reactive to light.  Neck:     Musculoskeletal: Normal range of motion and neck supple.     Thyroid: No thyroid mass or  thyromegaly.     Vascular: No carotid bruit.     Trachea: Trachea normal.  Cardiovascular:     Rate and Rhythm: Normal rate and regular rhythm.     Pulses: Normal pulses.     Heart sounds: Normal heart sounds, S1 normal and S2 normal. No murmur. No friction rub. No gallop.   Pulmonary:     Effort: Pulmonary effort is normal. No tachypnea or respiratory distress.     Breath sounds: Normal breath sounds. No decreased breath sounds, wheezing, rhonchi or rales.  Abdominal:     General: Bowel sounds are normal.     Palpations: Abdomen is soft.     Tenderness: There is no abdominal tenderness.  Skin:    General: Skin is warm and dry.     Findings: No rash.     Comments: Erythematous excoriations and maculopapular on bilateral legs with minimal surrounding erythema.  Neurological:     Mental Status: She is alert.  Psychiatric:        Mood and Affect: Mood is not anxious or depressed.        Speech: Speech normal.        Behavior: Behavior normal. Behavior is cooperative.        Thought Content: Thought content normal.        Judgment: Judgment normal.      Assessment and Plan      Eliezer Lofts, MD

## 2019-05-08 ENCOUNTER — Telehealth: Payer: Self-pay

## 2019-05-08 NOTE — Progress Notes (Signed)
05/08/2019 [place]. Varney [id number] 1965/12/08   HISTORY OF PRESENT ILLNESS: Eftihia Doubet. Earle is a 53 year old female with a past medical history of hypertension, kidney stones, IBS and an anal fissure.  Past cholecystectomy. She presents today with complaints of having anal fissure pain. She has a history of an anal fissure diagnosed at the time of her colonoscopy in 2015. She has infrequent bright red blood on the toilet tissue which last occurred approximately 4 weeks ago. Her anal pain has progressively worsened over the past few weeks. She is taking Miralax as needed to soften her stools. If she takes Miralax every day her stools become too loose. She reported using NTG and Diltiazem fissure cream in the past without improvement. She denies having any fever or chills. No purulent drainage from the rectum. She reports having chronic generalized abdominal pain, no change  She developed a pruritic erythematous papular rash which started on her RLE a few months ago which has spread to her abdomen and chest. She stopped taking Losartan which was her newest medication. She stopped eating gluten approximately 2 weeks ago without improvement as she questioned if this rash was related to possibly having Celiac disease. No family history of Celiac disease. She was prescribed a tapering course of Prednisone and Keflex by per PCP without improvement. She underwent a skin biopsy 3 weeks ago, she reports the results are pending.  She has borderline Vitamin B1 deficiency. No family history of  Colorectal cancer.   Colonoscopy 09/06/2013 by Dr. Carlean Purl: 1.  Small medium posterior anal fissure in the canal 2.  The colon mucosa with otherwise normal-excellent prep -Recall colonoscopy April 2025  Past Medical History:  Diagnosis Date  . Anal fissure - posterior 2015  . Anemia   . Gallstones   . GERD (gastroesophageal reflux disease)   . Hypertension   . IBS   . Kidney stones   . Morbid obesity ([place])     Past Surgical History:  Procedure Laterality Date  . CHOLECYSTECTOMY    . DILITATION & CURRETTAGE/HYSTROSCOPY WITH HYDROTHERMAL ABLATION N/A 09/13/2012   Procedure: DILATATION & CURETTAGE/HYSTEROSCOPY WITH HYDROTHERMAL ABLATION;  Surgeon: Osborne Oman, MD;  Location: [facility];  Service: Gynecology;  Laterality: N/A;  . TUBAL LIGATION      reports that she quit smoking about 17 years ago. She has never used smokeless tobacco. She reports that she does not drink alcohol or use drugs. family history includes Aneurysm in her father; Cancer in her sister; Coronary artery disease in her father; Heart attack in her maternal grandfather; Hypertension in her father; Kidney disease in her father; Seizures in her sister; Stroke in her mother; Tongue cancer in her sister. Allergies  Allergen Reactions  . Requip [Ropinirole Hcl] Other (See Comments)    Headache, Feels "sick"      Outpatient Encounter Medications as of 05/09/2019  Medication Sig  . betamethasone dipropionate 0.05 % cream Apply topically 2 (two) times daily. (Patient not taking: Reported on 04/27/2019)  . fexofenadine (ALLEGRA) 180 MG tablet TAKE 1 TABLET BY MOUTH EVERY DAY (Patient not taking: Reported on 04/27/2019)  . losartan (COZAAR) 50 MG tablet TAKE 1 TABLET BY MOUTH EVERY DAY  . spironolactone (ALDACTONE) 50 MG tablet TAKE 1 TABLET BY MOUTH EVERY DAY  . vitamin E 100 UNIT capsule Take by mouth daily.   No facility-administered encounter medications on file as of 05/09/2019.    Family History  Problem Relation Age of Onset  .  Stroke Mother   . Kidney disease Father   . Coronary artery disease Father   . Aneurysm Father        AAA  . Hypertension Father   . Seizures Sister   . Heart attack Maternal Grandfather   . Cancer Sister        ovarian/uterus  . Tongue cancer Sister   . Colon cancer Neg Hx   . Stomach cancer Neg Hx    Social History   Socioeconomic History  . Marital status: Married    Spouse name: Not on  file  . Number of children: 2  . Years of education: Not on file  . Highest education level: Not on file  Occupational History  . Occupation: Biochemist, clinical former bus Education administrator: UNEMPLOYED  Social Needs  . Financial resource strain: Not on file  . Food insecurity    Worry: Not on file    Inability: Not on file  . Transportation needs    Medical: Not on file    Non-medical: Not on file  Tobacco Use  . Smoking status: Former Smoker    Quit date: 02/09/2002    Years since quitting: 17.2  . Smokeless tobacco: Never Used  Substance and Sexual Activity  . Alcohol use: No  . Drug use: No  . Sexual activity: Yes    Partners: Male    Birth control/protection: Surgical  Lifestyle  . Physical activity    Days per week: Not on file    Minutes per session: Not on file  . Stress: Not on file  Relationships  . Social Herbalist on phone: Not on file    Gets together: Not on file    Attends religious service: Not on file    Active member of club or organization: Not on file    Attends meetings of clubs or organizations: Not on file    Relationship status: Not on file  . Intimate partner violence    Fear of current or ex partner: Not on file    Emotionally abused: Not on file    Physically abused: Not on file    Forced sexual activity: Not on file  Other Topics Concern  . Not on file  Social History Narrative   Married, 2 sons   Essentially no caffeine          REVIEW OF SYSTEMS  : All other systems reviewed and negative except where noted in the History of Present Illness.  PHYSICAL EXAM: BP 128/76   Pulse 81   Temp 97.6 F (36.4 C)   Ht 5\' 7"  (1.702 m) Comment: w.o shoes  Wt 261 lb (118.4 kg)   BMI 40.88 kg/m   General: Well developed 53 year old female in no acute distress Head: Normocephalic and atraumatic Eyes:  Sclerae anicteric,conjunctive pink. Ears: Normal auditory acuity Neck: Supple, no masses.  Lungs: Clear throughout to auscultation  Heart: Regular rate and rhythm Abdomen: Soft, nontender, non distended. No masses or hepatomegaly noted. Normal bowel sounds Rectal: Posterior anal fissure open and tender,  Incomplete rectal exam due to pain, no blood. No obvious abscess. No fistula.  Musculoskeletal: Symmetrical with no gross deformities  Skin: No lesions on visible extremities Extremities: No edema  Neurological: Alert oriented x 4, grossly nonfocal Cervical Nodes:  No significant cervical adenopathy Psychological:  Alert and cooperative. Normal mood and affect Skin: Scattered raised papular rash with granulation tissue to LLE, lower abdomen and chest, no  vesicles or pustules.  ASSESSMENT AND PLAN:  72. 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  -Referred to colorectal surgeon for further anal fissure treatment, consider future sphincterotomy  -Desitin mix with Recticare apply a small amount inside the anal opening tid x 6 weeks -Follow up with Dr. Carlean Purl in 6 to 8 weeks, to discuss scheduling a colonoscopy at that time  2. Dermatitis to chest, abdomen and lower extremities  -biopsy results pending   3. History of GERD, stable   CC:  Jinny Sanders, MD

## 2019-05-08 NOTE — Telephone Encounter (Signed)
[facility] Night - Client TELEPHONE ADVICE RECORD [facility] Patient Name: Michele Hall Howat Gender: Female DOB: 15-Jan-1966 Age: 53 Y 77 M 13 D Return Phone Number: [phone] (Primary) [place] [address] Client [facility] Night - Client Client Site [place] Physician Eliezer Lofts - MD Contact Type Call Who Is Calling Patient / Member / Family / Caregiver Call Type Triage / Clinical Relationship To Patient Self Return Phone Number [phone] (Primary) Chief Complaint Rash - Widespread Reason for Call Symptomatic / Request for Health Information Initial Comment Caller States she has issues with a rash that has progressively gotten worse. States she has a rash on her leg that is causing her leg and ankle to swell. States the rash is also on her stomach, on the back of her arms, and on her left breast. States she is not sure what caused it, but would like to speak with someone about this. Translation No Nurse Assessment Nurse: Lenna Gilford, RN, Melissa Date/Time (Eastern Time): 05/05/2019 6:06:51 PM Confirm and document reason for call. If symptomatic, describe symptoms. ---Caller States she has issues with a rash that has progressively gotten worse. The rash on her leg is causing her leg "with indentation" and ankle to swell "like a donut". Caller states that the rash is also on her stomach, on the back of her arms, and on her left breast. Caller states they just did a biopsy last week, no results yet. Caller states that rash is itching. Has the patient had close contact with a person known or suspected to have the novel coronavirus illness OR traveled / lives in area with major community spread (including international travel) in the last 14 days from the onset of symptoms? * If Asymptomatic, screen for exposure and travel within the last 14 days. ---No Does the patient  have any new or worsening symptoms? ---Yes Will a triage be completed? ---Yes Related visit to physician within the last 2 weeks? ---Yes Does the PT have any chronic conditions? (i.e. diabetes, asthma, this includes High risk factors for pregnancy, etc.) ---Yes List chronic conditions. ---Hx, HTN Is the patient pregnant or possibly pregnant? (Ask all females between the ages of 75-55) ---No Is this a behavioral health or substance abuse call? ---No PLEASE NOTE: All timestamps contained within this report are represented as [place] Standard Time. CONFIDENTIALTY NOTICE: This fax transmission is intended only for the addressee. It contains information that is legally privileged, confidential or otherwise protected from use or disclosure. If you are not the intended recipient, you are strictly prohibited from reviewing, disclosing, copying using or disseminating any of this information or taking any action in reliance on or regarding this information. If you have received this fax in error, please notify us immediately by telephone so that we can arrange for its return to [place]. Phone: [phone], Toll-Free: [phone], Fax: [phone] Page: 2 of 2 Call Id: [id number] Guidelines Guideline Title Affirmed Question Affirmed Notes Nurse Date/Time Eilene Ghazi Time) Leg Swelling and Edema [1] MODERATE leg swelling (e.g., swelling extends up to knees) AND [2] new onset or worsening Presutti, RN, Melissa 05/05/2019 6:13:26 PM Disp. Time Eilene Ghazi Time) Disposition Final User 05/05/2019 6:18:25 PM See PCP within 24 Hours Yes Presutti, RN, Lenna Sciara Caller Disagree/Comply Comply Caller Understands Yes PreDisposition Did not know what to do Care Advice Given Per Guideline SEE PCP WITHIN 24 HOURS: * IF OFFICE WILL BE CLOSED AND NO PCP (PRIMARY CARE PROVIDER) SECONDLEVEL TRIAGE: You  need to be seen within the next 24 hours. A clinic or an urgent care center is often a good source of care if your  doctor's office is closed or you can't get an appointment. LEG SWELLING OR EDEMA: * Elevate your legs or try to lay down once or twice daily for 20 minutes. * Avoid socks with an elastic band at the top. Wear comfortable shoes. CALL BACK IF: * Breathing difficulty or chest pain occurs * You become worse. CARE ADVICE given per Leg Swelling and Edema (Adult) guideline. Comments User: Gala Lewandowsky, RN Date/Time Eilene Ghazi Time): 05/05/2019 6:20:21 PM Caller advised to reach out to [place] office for [place] visit, [phone]. If unable to go to UC within next 24 hours. Referrals REFERRED TO PCP OFFICE

## 2019-05-08 NOTE — Telephone Encounter (Addendum)
I spoke with pt; and she did not go to UC or Sat Clinic; pt said she has appt to see Dr Diona Browner on 05/09/19. Pt said the swelling in her leg has gone down but the rash is terrible;offered pt appt today with another provider but pt does not want to see anyone except Dr Diona Browner. Pt said this has been going on for a while and prefers to only see Dr Diona Browner. Pt thinks she is having a problem with gluten. Pt said Ibuprofen helps the itch. Pt wants to keep the appt with Dr Diona Browner on 05/09/19 at 10:40. UC & ED precautions given and pt voiced understanding.

## 2019-05-09 ENCOUNTER — Ambulatory Visit: Payer: BC Managed Care – PPO | Admitting: Family Medicine

## 2019-05-09 ENCOUNTER — Telehealth: Payer: Self-pay | Admitting: General Surgery

## 2019-05-09 ENCOUNTER — Encounter: Payer: Self-pay | Admitting: Nurse Practitioner

## 2019-05-09 ENCOUNTER — Other Ambulatory Visit: Payer: Self-pay

## 2019-05-09 ENCOUNTER — Ambulatory Visit (INDEPENDENT_AMBULATORY_CARE_PROVIDER_SITE_OTHER): Payer: BC Managed Care – PPO | Admitting: Nurse Practitioner

## 2019-05-09 ENCOUNTER — Encounter: Payer: Self-pay | Admitting: Family Medicine

## 2019-05-09 VITALS — BP 128/76 | HR 81 | Temp 97.6°F | Ht 67.0 in | Wt 261.0 lb

## 2019-05-09 VITALS — BP 112/76 | HR 67 | Temp 98.2°F | Ht 66.5 in | Wt 261.5 lb

## 2019-05-09 DIAGNOSIS — L299 Pruritus, unspecified: Secondary | ICD-10-CM | POA: Diagnosis not present

## 2019-05-09 DIAGNOSIS — E538 Deficiency of other specified B group vitamins: Secondary | ICD-10-CM

## 2019-05-09 DIAGNOSIS — K625 Hemorrhage of anus and rectum: Secondary | ICD-10-CM

## 2019-05-09 DIAGNOSIS — R1084 Generalized abdominal pain: Secondary | ICD-10-CM

## 2019-05-09 DIAGNOSIS — L0390 Cellulitis, unspecified: Secondary | ICD-10-CM

## 2019-05-09 DIAGNOSIS — K602 Anal fissure, unspecified: Secondary | ICD-10-CM

## 2019-05-09 DIAGNOSIS — R21 Rash and other nonspecific skin eruption: Secondary | ICD-10-CM

## 2019-05-09 LAB — COMPREHENSIVE METABOLIC PANEL
ALT: 24 U/L (ref 0–35)
AST: 19 U/L (ref 0–37)
Albumin: 4.3 g/dL (ref 3.5–5.2)
Alkaline Phosphatase: 45 U/L (ref 39–117)
BUN: 15 mg/dL (ref 6–23)
CO2: 28 mEq/L (ref 19–32)
Calcium: 9.5 mg/dL (ref 8.4–10.5)
Chloride: 103 mEq/L (ref 96–112)
Creatinine, Ser: 0.75 mg/dL (ref 0.40–1.20)
GFR: 81.05 mL/min (ref >60.00)
Glucose, Bld: 86 mg/dL (ref 70–99)
Potassium: 4.6 mEq/L (ref 3.5–5.1)
Sodium: 139 mEq/L (ref 135–145)
Total Bilirubin: 0.6 mg/dL (ref 0.2–1.2)
Total Protein: 7.1 g/dL (ref 6.0–8.3)

## 2019-05-09 LAB — CBC WITH DIFFERENTIAL/PLATELET
Basophils Absolute: 0.1 10*3/uL (ref 0.0–0.1)
Basophils Relative: 0.9 % (ref 0.0–3.0)
Eosinophils Absolute: 0.3 10*3/uL (ref 0.0–0.7)
Eosinophils Relative: 4.5 % (ref 0.0–5.0)
HCT: 40.6 % (ref 36.0–46.0)
Hemoglobin: 14.5 g/dL (ref 12.0–15.0)
Lymphocytes Relative: 24.3 % (ref 12.0–46.0)
Lymphs Abs: 1.8 10*3/uL (ref 0.7–4.0)
MCHC: 35.6 g/dL (ref 30.0–36.0)
MCV: 99.3 fl (ref 78.0–100.0)
Monocytes Absolute: 0.8 10*3/uL (ref 0.1–1.0)
Monocytes Relative: 10.4 % (ref 3.0–12.0)
Neutro Abs: 4.5 10*3/uL (ref 1.4–7.7)
Neutrophils Relative %: 59.9 % (ref 43.0–77.0)
Platelets: 247 10*3/uL (ref 150.0–400.0)
RBC: 4.09 Mil/uL (ref 3.87–5.11)
RDW: 12.4 % (ref 11.5–15.5)
WBC: 7.5 10*3/uL (ref 4.0–10.5)

## 2019-05-09 LAB — VITAMIN B12: Vitamin B-12: 173 pg/mL — ABNORMAL LOW (ref 211–911)

## 2019-05-09 LAB — C-REACTIVE PROTEIN: CRP: <1 mg/dL (ref 0.5–20.0)

## 2019-05-09 MED ORDER — METHYLPREDNISOLONE ACETATE 40 MG/ML IJ SUSP
40.0000 mg | Freq: Once | INTRAMUSCULAR | Status: AC
  Administered 2019-05-09: 40 mg via INTRAMUSCULAR

## 2019-05-09 MED ORDER — CEPHALEXIN 500 MG PO CAPS: 500.0000 mg | ORAL_CAPSULE | Freq: Three times a day (TID) | ORAL | 0 refills | Status: DC

## 2019-05-09 NOTE — Progress Notes (Signed)
Chief Complaint  Patient presents with  . Rash  . Edema    History of Present Illness: HPI    53 year old female presents following biopsy of rash on left lower leg... now with  She has had spread of rash as well.. more on stomach and now on breast on left.  Some scratches are having more redness around them, swelling in left lower leg.  She is concerned about gluten tolerance. So she has tried stopping gluten. She has noted some improvement in symptoms. She has been on losartan since 2014... but is on different  manufacture tablet... she stopped 1 week ago... not sure if symptoms are better.  Biopsy results not returned yet.  This visit occurred during the SARS-CoV-2 public health emergency.  Safety protocols were in place, including screening questions prior to the visit, additional usage of staff PPE, and extensive cleaning of exam room while observing appropriate contact time as indicated for disinfecting solutions.   COVID 19 screen:  No recent travel or known exposure to COVID19 The patient denies respiratory symptoms of COVID 19 at this time. The importance of social distancing was discussed today.   Wt Readings from Last 3 Encounters:  05/09/19 261 lb 8 oz (118.6 kg)  05/09/19 261 lb (118.4 kg)  04/27/19 262 lb 1 oz (118.9 kg)      Review of Systems  Constitutional: Positive for malaise/fatigue. Negative for weight loss.  Gastrointestinal: Positive for abdominal pain, constipation and nausea. Negative for diarrhea.        Has appt with surgeon for rectal fissure today  Genitourinary: Negative for dysuria.      Past Medical History:  Diagnosis Date  . Anal fissure - posterior 2015  . Anemia   . Gallstones   . GERD (gastroesophageal reflux disease)   . Hypertension   . IBS   . Kidney stones   . Morbid obesity ([place])     reports that she quit smoking about 17 years ago. She has never used smokeless tobacco. She reports that she does not drink alcohol or use  drugs.   Current Outpatient Medications:  .  fexofenadine (ALLEGRA) 180 MG tablet, Take 180 mg by mouth daily as needed for allergies or rhinitis., Disp: , Rfl:  .  spironolactone (ALDACTONE) 50 MG tablet, TAKE 1 TABLET BY MOUTH EVERY DAY, Disp: 90 tablet, Rfl: 1 .  vitamin E 100 UNIT capsule, Take by mouth daily., Disp: , Rfl:  .  losartan (COZAAR) 50 MG tablet, TAKE 1 TABLET BY MOUTH EVERY DAY (Patient not taking: Reported on 05/09/2019), Disp: 90 tablet, Rfl: 1   Observations/Objective: Blood pressure 112/76, pulse 67, temperature 98.2 F (36.8 C), temperature source Temporal, height 5' 6.5" (1.689 m), weight 261 lb 8 oz (118.6 kg), SpO2 97 %.  Physical Exam Constitutional:      General: She is not in acute distress.    Appearance: Normal appearance. She is well-developed. She is not ill-appearing or toxic-appearing.  HENT:     Head: Normocephalic.     Right Ear: Hearing, tympanic membrane, ear canal and external ear normal. Tympanic membrane is not erythematous, retracted or bulging.     Left Ear: Hearing, tympanic membrane, ear canal and external ear normal. Tympanic membrane is not erythematous, retracted or bulging.     Nose: No mucosal edema or rhinorrhea.     Right Sinus: No maxillary sinus tenderness or frontal sinus tenderness.     Left Sinus: No maxillary sinus tenderness or frontal sinus  tenderness.     Mouth/Throat:     Pharynx: Uvula midline.  Eyes:     General: Lids are normal. Lids are everted, no foreign bodies appreciated.     Conjunctiva/sclera: Conjunctivae normal.     Pupils: Pupils are equal, round, and reactive to light.  Neck:     Musculoskeletal: Normal range of motion and neck supple.     Thyroid: No thyroid mass or thyromegaly.     Vascular: No carotid bruit.     Trachea: Trachea normal.  Cardiovascular:     Rate and Rhythm: Normal rate and regular rhythm.     Pulses: Normal pulses.     Heart sounds: Normal heart sounds, S1 normal and S2 normal. No  murmur. No friction rub. No gallop.   Pulmonary:     Effort: Pulmonary effort is normal. No tachypnea or respiratory distress.     Breath sounds: Normal breath sounds. No decreased breath sounds, wheezing, rhonchi or rales.  Abdominal:     General: Bowel sounds are normal.     Palpations: Abdomen is soft.     Tenderness: There is no abdominal tenderness.  Skin:    General: Skin is warm and dry.     Findings: No rash.     Comments: Erythematous excoriations and maculopapular on bilateral legs with NOW with significant surrounding erythema to some lesions.. mainly lesion on left posterior leg   Rash now also spread to breasts and more excoriations on torso/abd.  No pustules.   left leg 1 plus pitting edema at rash site mid calf.  Neurological:     Mental Status: She is alert.  Psychiatric:        Mood and Affect: Mood is not anxious or depressed.        Speech: Speech normal.        Behavior: Behavior normal. Behavior is cooperative.        Thought Content: Thought content normal.        Judgment: Judgment normal.      Assessment and Plan Pruritus Unclear cause...possible scelisac, losartan SE.  Will move forward with testing.  Path pending on biopsy.  Cellulitis Now with bacterial superinfeciton.. treat with 7 days keflex.  Rash Treat with IM depomedrol 40 mg today for symptom control.       Eliezer Lofts, MD

## 2019-05-09 NOTE — Patient Instructions (Addendum)
If you are age 52 or older, your body mass index should be between 23-30. Your Body mass index is 40.88 kg/m. If this is out of the aforementioned range listed, please consider follow up with your Primary Care Provider.  If you are age 48 or younger, your body mass index should be between 19-25. Your Body mass index is 40.88 kg/m. If this is out of the aformentioned range listed, please consider follow up with your Primary Care Provider.   We are referring you to a rectal surgeon at [facility]. They will contact you to schedule your appointment. If you do not hear from them within one week please contact our office back at [phone].  Your provider has requested that you go to the basement level for lab work before leaving today. Press "B" on the elevator. The lab is located at the first door on the left as you exit the elevator.  Use Recticare mixed with Desitin. Apply a small amount inside the anal opening and to the external area three times daily for 6 weeks.  Follow up with Dr Carlean Purl in 6-8 weeks to discuss scheduling a colonoscopy.  Thank you for choosing me and [facility]

## 2019-05-09 NOTE — Telephone Encounter (Signed)
[facility], patient is scheduled for an urgent appointment 05/10/2019 @8 :45am.    Notified the patient and she verbalized understanding and read back the date and time.

## 2019-05-09 NOTE — Addendum Note (Signed)
Addended by: Carter Kitten on: 05/09/2019 11:26 AM   Modules accepted: Orders

## 2019-05-09 NOTE — Patient Instructions (Addendum)
Please stop at the lab to have labs drawn. Return in 2 week back on regular diet for celiac tests.  Complete course of antibiotics.  We will call with pathology results.

## 2019-05-09 NOTE — Assessment & Plan Note (Signed)
Now with bacterial superinfeciton.. treat with 7 days keflex.

## 2019-05-09 NOTE — Assessment & Plan Note (Signed)
Treat with IM depomedrol 40 mg today for symptom control.

## 2019-05-09 NOTE — Assessment & Plan Note (Signed)
Unclear cause...possible scelisac, losartan SE.  Will move forward with testing.  Path pending on biopsy.

## 2019-05-12 ENCOUNTER — Telehealth: Payer: Self-pay | Admitting: *Deleted

## 2019-05-12 NOTE — Telephone Encounter (Signed)
Called and spoke with Michele Hall about pathology report.  I informed her that is showed reaction suggesting chronic contact dermatitis or eczema. Advised not much help except is does rule out any serious issue.  I ask how her symptoms were and she states rash is getting better but is not gone.  Itch is not as sever as it was.  She is still taking the antibiotics.  Pathology report sent for scanning. FYI to Dr. Diona Browner.

## 2019-05-12 NOTE — Telephone Encounter (Signed)
Noted. We will move forward with celiac testing as ptr requested in 2 weeks. I still advise derm referral.

## 2019-05-23 ENCOUNTER — Other Ambulatory Visit (INDEPENDENT_AMBULATORY_CARE_PROVIDER_SITE_OTHER): Payer: BC Managed Care – PPO

## 2019-05-23 ENCOUNTER — Other Ambulatory Visit: Payer: Self-pay

## 2019-05-23 DIAGNOSIS — L299 Pruritus, unspecified: Secondary | ICD-10-CM | POA: Diagnosis not present

## 2019-05-29 ENCOUNTER — Encounter: Payer: Self-pay | Admitting: Family Medicine

## 2019-05-29 DIAGNOSIS — H00015 Hordeolum externum left lower eyelid: Secondary | ICD-10-CM | POA: Insufficient documentation

## 2019-05-29 NOTE — Assessment & Plan Note (Signed)
Treat with warm compress and topical antibiotics.

## 2019-05-31 LAB — CELIAC PNL 2 RFLX ENDOMYSIAL AB TTR
(tTG) Ab, IgA: <1 U/mL
(tTG) Ab, IgG: 1 U/mL
Endomysial Ab IgA: NEGATIVE
Gliadin IgA: 8 U (ref <20)
Gliadin IgG: 1 U (ref <20)
Immunoglobulin A: 324 mg/dL — ABNORMAL HIGH (ref 47–310)

## 2019-06-27 ENCOUNTER — Other Ambulatory Visit: Payer: Self-pay

## 2019-06-27 ENCOUNTER — Encounter: Payer: Self-pay | Admitting: Family Medicine

## 2019-06-27 ENCOUNTER — Ambulatory Visit (INDEPENDENT_AMBULATORY_CARE_PROVIDER_SITE_OTHER): Payer: BC Managed Care – PPO | Admitting: Family Medicine

## 2019-06-27 VITALS — Ht 66.5 in

## 2019-06-27 DIAGNOSIS — I10 Essential (primary) hypertension: Secondary | ICD-10-CM | POA: Diagnosis not present

## 2019-06-27 DIAGNOSIS — R21 Rash and other nonspecific skin eruption: Secondary | ICD-10-CM | POA: Diagnosis not present

## 2019-06-27 NOTE — Assessment & Plan Note (Signed)
Refer to derm ASAP.

## 2019-06-27 NOTE — Progress Notes (Signed)
VIRTUAL VISIT Due to national recommendations of social distancing due to [place] 19, a virtual visit is felt to be most appropriate for this patient at this time.   I connected with the patient on 06/27/19 at  9:00 AM EST by virtual telehealth platform and verified that I am speaking with the correct person using two identifiers.   I discussed the limitations, risks, security and privacy concerns of performing an evaluation and management service by  virtual telehealth platform and the availability of in person appointments. I also discussed with the patient that there may be a patient responsible charge related to this service. The patient expressed understanding and agreed to proceed.  Patient location: Home Provider Location: [facility] Participants: Valmai Vandenberghe Diona Browner and Sahithi Bowermaster. [place]   Chief Complaint  Patient presents with  . Rash    on legs-never heard back about Derm Referral    History of Present Illness:    54 year old rash on bilateral legs, some now across on chest... rash feels itchy off and on. Ongoing > 6 months.  Not really spreading but no better and pt very bothered with the rash.   She has been evaluated with a biopsy: showed SPONGIOTIC PSORIASIFORM DERMATITIS. Treated with topical, oral and injected steroid off and on... only temporary improvement.  Antibiotics help occ is superficial bacterial infection introduced with scratching.   No known allergic trigger but has not had allergy testing. Pt on allegra.  Some associated swelling in legs.   Celiac test  In 06/02/2019 was  Unremarkable.   She has been off losartan x 4 weeks...  No change in rash.  Per wrist cuff BP at home few weeks ago BP 140/90 BP Readings from Last 3 Encounters:  05/09/19 112/76  05/09/19 128/76  04/27/19 118/70     COVID 19 screen No recent travel or known exposure to [place] The patient denies respiratory symptoms of COVID 19 at this time.  The importance of social  distancing was discussed today.   Review of Systems  Constitutional: Negative for chills and fever.  HENT: Negative for congestion and ear pain.   Eyes: Negative for pain and redness.  Respiratory: Negative for cough and shortness of breath.   Cardiovascular: Negative for chest pain, palpitations and leg swelling.  Gastrointestinal: Negative for abdominal pain, blood in stool, constipation, diarrhea, nausea and vomiting.  Genitourinary: Negative for dysuria.  Musculoskeletal: Negative for falls and myalgias.  Skin: Positive for itching and rash.  Neurological: Negative for dizziness.  Psychiatric/Behavioral: Negative for depression. The patient is not nervous/anxious.       Past Medical History:  Diagnosis Date  . Anal fissure - posterior 2015  . Anemia   . Gallstones   . GERD (gastroesophageal reflux disease)   . Hypertension   . IBS   . Kidney stones   . Morbid obesity ([place])     reports that she quit smoking about 17 years ago. She has never used smokeless tobacco. She reports that she does not drink alcohol or use drugs.   Current Outpatient Medications:  .  esomeprazole (NEXIUM) 20 MG capsule, Take 20 mg by mouth daily., Disp: , Rfl:  .  fexofenadine (ALLEGRA) 180 MG tablet, Take 180 mg by mouth daily as needed for allergies or rhinitis., Disp: , Rfl:  .  spironolactone (ALDACTONE) 50 MG tablet, TAKE 1 TABLET BY MOUTH EVERY DAY, Disp: 90 tablet, Rfl: 1 .  vitamin E 100 UNIT capsule, Take by mouth daily., Disp: ,  Rfl:  .  cephALEXin (KEFLEX) 500 MG capsule, Take 1 capsule (500 mg total) by mouth 3 (three) times daily. (Patient not taking: Reported on 06/27/2019), Disp: 21 capsule, Rfl: 0 .  losartan (COZAAR) 50 MG tablet, TAKE 1 TABLET BY MOUTH EVERY DAY (Patient not taking: Reported on 05/09/2019), Disp: 90 tablet, Rfl: 1   Observations/Objective: Height 5' 6.5" (1.689 m).  Physical Exam  Physical Exam Constitutional:      General: The patient is not in acute  distress. Pulmonary:     Effort: Pulmonary effort is normal. No respiratory distress.  Neurological:     Mental Status: The patient is alert and oriented to person, place, and time.  Psychiatric:        Mood and Affect: Mood normal.        Behavior: Behavior normal.   Assessment and Plan  Essential hypertension, benign Follow BP at home.. if not at goal < 140/90 we will need to  Restart losartan or try a different medicaiton. Continue spironolactone.  Rash and nonspecific skin eruption Refer to derm ASAP.   I discussed the assessment and treatment plan with the patient. The patient was provided an opportunity to ask questions and all were answered. The patient agreed with the plan and demonstrated an understanding of the instructions.   The patient was advised to call back or seek an in-person evaluation if the symptoms worsen or if the condition fails to improve as anticipated.     Eliezer Lofts, MD

## 2019-06-27 NOTE — Assessment & Plan Note (Signed)
Follow BP at home.. if not at goal < 140/90 we will need to  Restart losartan or try a different medicaiton. Continue spironolactone.

## 2019-08-15 ENCOUNTER — Ambulatory Visit: Payer: BC Managed Care – PPO | Admitting: Family Medicine

## 2019-08-15 ENCOUNTER — Other Ambulatory Visit: Payer: Self-pay

## 2019-08-15 ENCOUNTER — Encounter: Payer: Self-pay | Admitting: Family Medicine

## 2019-08-15 VITALS — BP 120/78 | HR 91 | Temp 98.1°F | Ht 66.5 in | Wt 263.5 lb

## 2019-08-15 DIAGNOSIS — R221 Localized swelling, mass and lump, neck: Secondary | ICD-10-CM | POA: Insufficient documentation

## 2019-08-15 DIAGNOSIS — R21 Rash and other nonspecific skin eruption: Secondary | ICD-10-CM

## 2019-08-15 DIAGNOSIS — L309 Dermatitis, unspecified: Secondary | ICD-10-CM | POA: Diagnosis not present

## 2019-08-15 MED ORDER — MONTELUKAST SODIUM 10 MG PO TABS: 10.0000 mg | ORAL_TABLET | Freq: Every day | ORAL | 3 refills | Status: DC

## 2019-08-15 NOTE — Progress Notes (Signed)
Chief Complaint  Patient presents with  . Rash    History of Present Illness: HPI  54 year old female with history of chronic rash  ( Bx showed chronic dermatitis) presents for continuing rash.  Derm gave her hydroxyzine ( cannot take during the day given sleepy),  Given steroid ointment and cream.. minimally helpful.  Using allerga during the day.  None of this helps much.   Per pt derm  Rash is still very itchy.. on neck and bilateral lower   She has tried prednisone off and on in past... improves temporarily.  No change with antibiotics. Negative celiac panel. She stopped her losartan for a month.. no better.    She noted knot in anterior neck in last week. Approximately 1.5 cm diameter, firm, mobile.   Mildly sore. She is a former smoker  No tooth or ST.      This visit occurred during the SARS-CoV-2 public health emergency.  Safety protocols were in place, including screening questions prior to the visit, additional usage of staff PPE, and extensive cleaning of exam room while observing appropriate contact time as indicated for disinfecting solutions.   COVID 19 screen:  No recent travel or known exposure to COVID19 The patient denies respiratory symptoms of COVID 19 at this time. The importance of social distancing was discussed today.     ROS    Past Medical History:  Diagnosis Date  . Anal fissure - posterior 2015  . Anemia   . Gallstones   . GERD (gastroesophageal reflux disease)   . Hypertension   . IBS   . Kidney stones   . Morbid obesity ([place])     reports that she quit smoking about 17 years ago. She has never used smokeless tobacco. She reports that she does not drink alcohol or use drugs.   Current Outpatient Medications:  .  cyanocobalamin 1000 MCG tablet, Take 1,000 mcg by mouth daily., Disp: , Rfl:  .  fexofenadine (ALLEGRA) 180 MG tablet, Take 180 mg by mouth daily as needed for allergies or rhinitis., Disp: , Rfl:  .  hydrOXYzine  (VISTARIL) 25 MG capsule, Take 25-50 mg by mouth at bedtime., Disp: , Rfl:  .  spironolactone (ALDACTONE) 50 MG tablet, TAKE 1 TABLET BY MOUTH EVERY DAY, Disp: 90 tablet, Rfl: 1   Observations/Objective: Blood pressure 120/78, pulse 91, temperature 98.1 F (36.7 C), temperature source Temporal, height 5' 6.5" (1.689 m), weight 263 lb 8 oz (119.5 kg), SpO2 96 %.  Physical Exam Constitutional:      General: She is not in acute distress.    Appearance: Normal appearance. She is well-developed. She is obese. She is not ill-appearing or toxic-appearing.  HENT:     Head: Normocephalic.     Right Ear: Hearing, tympanic membrane, ear canal and external ear normal. Tympanic membrane is not erythematous, retracted or bulging.     Left Ear: Hearing, tympanic membrane, ear canal and external ear normal. Tympanic membrane is not erythematous, retracted or bulging.     Nose: No mucosal edema or rhinorrhea.     Right Sinus: No maxillary sinus tenderness or frontal sinus tenderness.     Left Sinus: No maxillary sinus tenderness or frontal sinus tenderness.     Mouth/Throat:     Pharynx: Uvula midline.  Eyes:     General: Lids are normal. Lids are everted, no foreign bodies appreciated.     Conjunctiva/sclera: Conjunctivae normal.     Pupils: Pupils are equal, round, and reactive  to light.  Neck:     Thyroid: No thyroid mass or thyromegaly.     Vascular: No carotid bruit.     Trachea: Trachea normal.      Comments:  1.5 cm lesion  Firm mobile under neck in fatty tissue Cardiovascular:     Rate and Rhythm: Normal rate and regular rhythm.     Pulses: Normal pulses.     Heart sounds: Normal heart sounds, S1 normal and S2 normal. No murmur. No friction rub. No gallop.   Pulmonary:     Effort: Pulmonary effort is normal. No tachypnea or respiratory distress.     Breath sounds: Normal breath sounds. No decreased breath sounds, wheezing, rhonchi or rales.  Abdominal:     General: Bowel sounds are  normal.     Palpations: Abdomen is soft.     Tenderness: There is no abdominal tenderness.  Musculoskeletal:     Cervical back: Normal range of motion and neck supple.  Lymphadenopathy:     Cervical: No cervical adenopathy.     Right cervical: No superficial, deep or posterior cervical adenopathy.    Left cervical: No superficial, deep or posterior cervical adenopathy.  Skin:    General: Skin is warm and dry.     Coloration: Skin is ashen.     Findings: Rash present.          Comments: chest and bilateral legs with thickened  Pink skin and excoriations  Very dry skin.  Neurological:     Mental Status: She is alert.  Psychiatric:        Mood and Affect: Mood is not anxious or depressed.        Speech: Speech normal.        Behavior: Behavior normal. Behavior is cooperative.        Thought Content: Thought content normal.        Judgment: Judgment normal.      Assessment and Plan Chronic dermatitis Unclear cause. Only improved with prednisone oral temporarily.  Refer for allergy testing.  No better off losartan x 1 month.. trial off spironolactone.  Very dry skin: hydrate and use moisturizing cream.  Continue allegra but add singulair. Atara prn.  Nodule of neck No oral symptoms or lesions. No S/S allergies.   ? Submental lymph node vs salivary gland. Too high to be thyroid. ? reactive to rash?  Eval with US soft tissue neck.       Eliezer Lofts, MD

## 2019-08-15 NOTE — Patient Instructions (Addendum)
Use Cetaphil cream twice daily.  We will call with allergist referral.  Start Singulair at bedtime.   We will call to set up [place] of neck.

## 2019-08-15 NOTE — Assessment & Plan Note (Signed)
Unclear cause. Only improved with prednisone oral temporarily.  Refer for allergy testing.  No better off losartan x 1 month.. trial off spironolactone.  Very dry skin: hydrate and use moisturizing cream.  Continue allegra but add singulair. Atara prn.

## 2019-08-15 NOTE — Assessment & Plan Note (Signed)
No oral symptoms or lesions. No S/S allergies.   ? Submental lymph node vs salivary gland. Too high to be thyroid. ? reactive to rash?  Eval with US soft tissue neck.

## 2019-08-16 ENCOUNTER — Ambulatory Visit: Payer: BC Managed Care – PPO | Admitting: Allergy

## 2019-08-16 ENCOUNTER — Encounter: Payer: Self-pay | Admitting: Allergy

## 2019-08-16 VITALS — BP 118/76 | HR 90 | Temp 98.0°F | Resp 18 | Ht 66.5 in | Wt 263.0 lb

## 2019-08-16 DIAGNOSIS — L309 Dermatitis, unspecified: Secondary | ICD-10-CM

## 2019-08-16 DIAGNOSIS — J3089 Other allergic rhinitis: Secondary | ICD-10-CM

## 2019-08-16 MED ORDER — PIMECROLIMUS 1 % EX CREA: TOPICAL_CREAM | Freq: Two times a day (BID) | CUTANEOUS | 5 refills | Status: DC

## 2019-08-16 NOTE — Patient Instructions (Addendum)
Dermatitis  - ongoing rash. Biopsy results are consistent with eczema variants vs contact dermatitis  - environmental allergy skin testing today is positive to dust mites and cockroach  - common food allergy skin testing today is negative  - allergen avoidance measures discussed/handouts provided  - recommend performing patch testing to assess for potential contact dermatitis.  Patch testing involves applying TRUE test patch panels to your back and taped in place.  Patches remain in place for 2 days with readings at day 3 and 5.  Patches are best placed on Mondays with readings on Wednesday and Friday of same week.  Do not get patches wet once placed.  You can schedule for patch testing placement per your schedule  - call us back and let us know which cream/ointments you have  - would recommend having access to a non-steroidal topical agent like Elidel which will prescribe.  Can use Elidel alone or layered with topical steroid if needed  - can perform wet-to-dry wraps to legs/arms if needed to help moisturize the skin further  - continue use of Vaseline to help trap moisture in skin after bathing.  Apply your medicated ointments first then apply Vaseline on top.    - continue Allegra and start Singulair daily to help with itch control as well as control of allergy symptoms.   - can use hydroxyzine 25-50mg  at bedtime as needed for nighttime itch control.   - further recommendations will depend on which ointments you have at home.   Follow-up 3 months or sooner if needed   True Test looks for the following sensitivities:

## 2019-08-16 NOTE — Progress Notes (Signed)
New Patient Note  RE: Michele Hall MRN: [id number] DOB: 29-Dec-1965 Date of Office Visit: 08/16/2019  Referring provider: Jinny Sanders, MD Primary care provider: Jinny Sanders, MD  Chief Complaint: rash  History of present illness: Michele Hall is a 54 y.o. female presenting today for consultation for chronic dermatitis allergy testing.  She has a rash that is prominently on her legs currently.  She states she has had the rash across her stomach, upper back, arms and chest. The rash does not itch all the time.  Denies the rash being painful or bothersome besides occasional itch.  She states that when it does itch that she scratches and will break the skin and has many excoriations due to this.  She states sometimes especially on her legs if it is very itchy and she has been scratching at it sometimes will swell and ooze.  Sometimes if the skin gets dry it does get flaky and scaly.  Has not had any associated swelling, fevers, joint aches or pains.  Household members do not have similar symptoms.  She states she has had this rash ongoing now since 2019 she believes. She has tried to pinpoint what makes the rash worse and has not been able to pinpoint anything.   She states she has had home exterminated.  She states she has thrown things away inthe home concerned it could be triggering.  She states she bought covers for her mattress pillows and box [place] even. She states initially she thought she was having reactions to poison [place] as her husband does landscaping on the side.  But states since she continues to have issues of this rash that she does not believe it is related to that. From a medication standpoint she states she has been off her medications for a long time however she did stop losartan since she has had the rash.  She has not had any improvement in the rash since being off of losartan. She has seen dermatology for this rash and has had a biopsy.  Biopsy showed "spongiotic  psoriasiform dermatitis" with "PAS stain is negative for superficial hyphae.  There is a spongiotic and psoriasiform tissue reaction involving the epidermis with an underlying mononuclear cell infiltrate in which eosinophils are not conspicuous.  The pattern can be seen in a chronic eczematous reaction such as a chronic contact dermatitis, id reaction or nummular eczema. She has also had negative celiac screening.  CBC, CMP and inflammatory markers from December 2020 are all unremarkable. She states dermatology prescribed her a steroid ointment and a steroid cream.  She states the cream has done nothing for the rash.  She states the steroid ointment she was advised to apply and then wrapped with Saran wrap.  She states she has not done this as she did not know how to perform this appropriately. She is taking Allegra daily due to seasonal allergy symptoms.  She has been prescribed hydroxyzine to use for nighttime itch but she has not taken this.  She has had this rash to be treated with both antibiotics and prednisone.  She states with the prednisone the rash does clear up when she has used courses of this. Her PCP recently prescribed Singulair which she has not started at this time.  She does report having seasonal allergies and states she did have allergy testing as a child and recalls being allergic to dust and pollens.  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  Review of systems: Review of Systems  Constitutional: Negative.   HENT: Negative.   Eyes: Negative.   Respiratory: Negative.   Cardiovascular: Negative.   Gastrointestinal: Negative.   Musculoskeletal: Negative.   Skin: Positive for itching and rash.  Neurological: Negative.     All other systems negative unless noted above in HPI  Past medical history: Past Medical History:  Diagnosis Date  . Anal fissure - posterior 2015  . Anemia   . Gallstones   . GERD (gastroesophageal reflux disease)   . Hypertension   . IBS    . Kidney stones   . Morbid obesity ([place])     Past surgical history: Past Surgical History:  Procedure Laterality Date  . CHOLECYSTECTOMY    . DILITATION & CURRETTAGE/HYSTROSCOPY WITH HYDROTHERMAL ABLATION N/A 09/13/2012   Procedure: DILATATION & CURETTAGE/HYSTEROSCOPY WITH HYDROTHERMAL ABLATION;  Surgeon: Osborne Oman, MD;  Location: [facility];  Service: Gynecology;  Laterality: N/A;  . TUBAL LIGATION      Family history:  Family History  Problem Relation Age of Onset  . Stroke Mother   . Kidney disease Father   . Coronary artery disease Father   . Aneurysm Father        AAA  . Hypertension Father   . Seizures Sister   . Heart attack Maternal Grandfather   . Cancer Sister        ovarian/uterus  . Tongue cancer Sister   . Colon cancer Neg Hx   . Stomach cancer Neg Hx   . Urticaria Neg Hx   . Immunodeficiency Neg Hx   . Eczema Neg Hx   . Atopy Neg Hx   . Asthma Neg Hx   . Angioedema Neg Hx   . Allergic rhinitis Neg Hx     Social history: Lives in a home with carpeting with electric heating and central cooling.  Dog in the home.  No concern for roaches in the home.  She currently is a homemaker and performs housecleaning.  Denies a smoking history.  Medication List: Current Outpatient Medications  Medication Sig Dispense Refill  . cyanocobalamin 1000 MCG tablet Take 1,000 mcg by mouth daily.    . fexofenadine (ALLEGRA) 180 MG tablet Take 180 mg by mouth daily as needed for allergies or rhinitis.    . hydrOXYzine (VISTARIL) 25 MG capsule Take 25-50 mg by mouth at bedtime.    Marland Kitchen spironolactone (ALDACTONE) 50 MG tablet TAKE 1 TABLET BY MOUTH EVERY DAY 90 tablet 1  . montelukast (SINGULAIR) 10 MG tablet Take 1 tablet (10 mg total) by mouth at bedtime. (Patient not taking: Reported on 08/16/2019) 30 tablet 3   No current facility-administered medications for this visit.    Known medication allergies: Allergies  Allergen Reactions  . Requip [Ropinirole Hcl] Other (See  Comments)    Headache, Feels "sick"     Physical examination: Blood pressure 118/76, pulse 90, temperature 98 F (36.7 C), temperature source Temporal, resp. rate 18, height 5' 6.5" (1.689 m), weight 263 lb (119.3 kg), SpO2 98 %.  General: Alert, interactive, in no acute distress. HEENT: PERRLA, TMs pearly gray, turbinates minimally edematous without discharge, post-pharynx non erythematous. Neck: Supple without lymphadenopathy. Lungs: Clear to auscultation without wheezing, rhonchi or rales. {no increased work of breathing. CV: Normal S1, S2 without murmurs. Abdomen: Nondistended, nontender. Skin: Anterior legs bilaterally appears well moisturized after Vaseline application.  There are many excoriated lesions with an underlying erythematous large area plaque. Extremities:  No clubbing, cyanosis or edema.  Neuro:   Grossly intact.  Diagnositics/Labs: Labs: See HPI  Allergy testing: Environmental allergy skin prick testing is negative.  Intradermal skin testing is positive to cockroach and mite mix. 10 common food allergy skin prick testing is negative. Allergy testing results were read and interpreted by provider, documented by clinical staff.   Assessment and plan:   Dermatitis, chronic  - ongoing rash. Biopsy results are consistent with eczema variants vs contact dermatitis  - environmental allergy skin testing today is positive to dust mites and cockroach  - common food allergy skin testing today is negative  - allergen avoidance measures discussed/handouts provided  - recommend performing patch testing to assess for potential contact dermatitis.  Patch testing involves applying TRUE test patch panels to your back and taped in place.  Patches remain in place for 2 days with readings at day 3 and 5.  Patches are best placed on Mondays with readings on Wednesday and Friday of same week.  Do not get patches wet once placed.  You can schedule for patch testing placement per your  schedule  - call us back and let us know which cream/ointments you have  - would recommend having access to a non-steroidal topical agent like Elidel which will prescribe.  Can use Elidel alone or layered with topical steroid if needed  - can perform wet-to-dry wraps to legs/arms if needed to help moisturize the skin further  - continue use of Vaseline to help trap moisture in skin after bathing.  Apply your medicated ointments first then apply Vaseline on top.    - continue Allegra and start Singulair daily to help with itch control as well as control of allergy symptoms.   - can use hydroxyzine 25-50mg  at bedtime as needed for nighttime itch control.   - further recommendations will depend on which ointments you have at home.   Allergic rhinitis  -Testing as above  -Antihistamine and Singulair as above  Follow-up 3 months or sooner if needed  I appreciate the opportunity to take part in Valena's care. Please do not hesitate to contact me with questions.  Sincerely,   Prudy Feeler, MD Allergy/Immunology [facility]

## 2019-08-17 ENCOUNTER — Telehealth: Payer: Self-pay

## 2019-08-17 NOTE — Telephone Encounter (Signed)
PA for Pimecrolimus 1% cream has been submitted to [facility] through cover my meds.    Your information has been submitted to [facility]. To check for an updated outcome later, reopen this PA request from your dashboard.  If [employer] has not responded to your request within 24 hours, contact [place] at [phone]. If you think there may be a problem with your PA request, use our live chat feature at the bottom right.

## 2019-08-17 NOTE — Telephone Encounter (Signed)
This request has received a Favorable outcome.  Please note any additional information provided by [employer] at the bottom of this request.  PA Case ID: [id number] - Rx #: [id number] Approval dates:  08/17/2019 - 08/17/2022

## 2019-08-17 NOTE — Telephone Encounter (Signed)
Pt has been notified of approval. Pharmacy has been notified via fax of the approval.

## 2019-08-17 NOTE — Telephone Encounter (Signed)
Patient was seen on yesterday 08/16/19. Patient states Dr Nelva Bush wanted to know which creams she has tried.   Betamethasone dipropionate cream 0.5% Fluticasone Prop Cream 0.05% Clobetasol Ointment 0.5%  Thanks

## 2019-08-17 NOTE — Telephone Encounter (Signed)
Call to patient:  Dr Nelva Bush wants her to keep using the clobetasol ointment since it is one of the strongest ointments.  She can continue to wrap her areas in saran wrap, when she gets the elidel cream, per Dr Nelva Bush she can layer it with the clobetasol and the elidel, or she can use it alone, she just needs to figure out which combination works the best.

## 2019-08-18 ENCOUNTER — Ambulatory Visit
Admission: RE | Admit: 2019-08-18 | Discharge: 2019-08-18 | Disposition: A | Discharge status: Home / Self Care | Payer: BC Managed Care – PPO | Source: Ambulatory Visit | Attending: Family Medicine | Admitting: Family Medicine

## 2019-08-18 ENCOUNTER — Ambulatory Visit: Payer: Self-pay | Admitting: Allergy

## 2019-08-18 DIAGNOSIS — R221 Localized swelling, mass and lump, neck: Secondary | ICD-10-CM

## 2019-08-18 IMAGING — US US SOFT TISSUE HEAD/NECK
1 series · 14 of 17 positions shown · non-contrast
Comparison: None.

CLINICAL DATA: Swelling or nodule in the anterior submandibular
region above thyroid.

EXAM:
ULTRASOUND OF HEAD/NECK SOFT TISSUES
TECHNIQUE: Ultrasound examination of the head and neck soft tissues was
performed in the area of clinical concern.

[Series 1: us soft tissue head/neck · 0.06mm/px · 17 acquisitions, 14 frames shown]
[im 1/17]
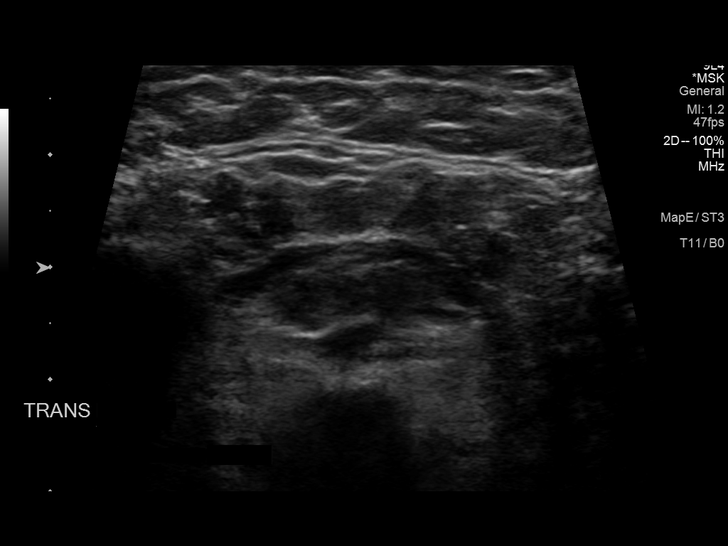
[im 2/17]
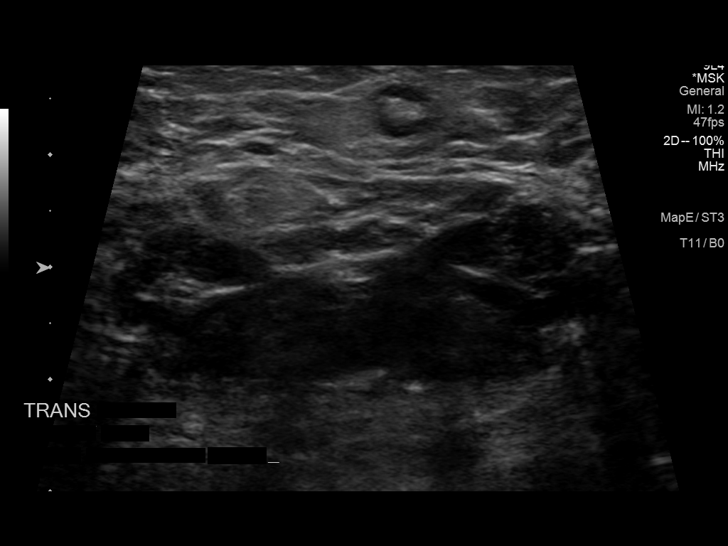
[im 4/17]
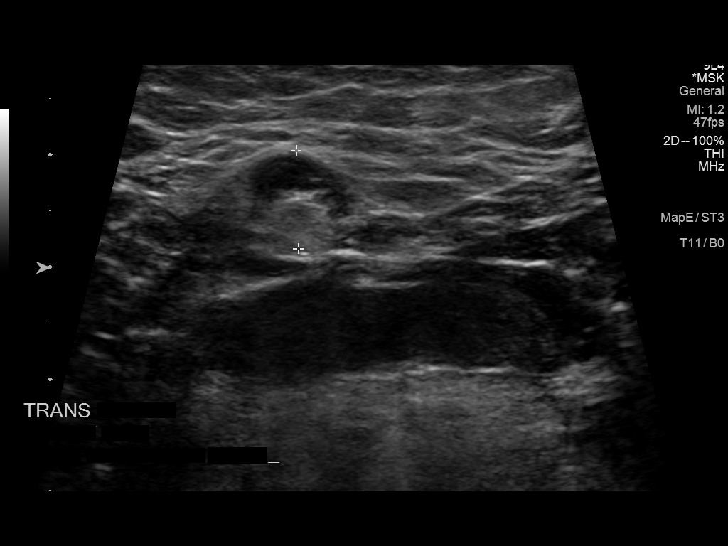
[im 5/17]
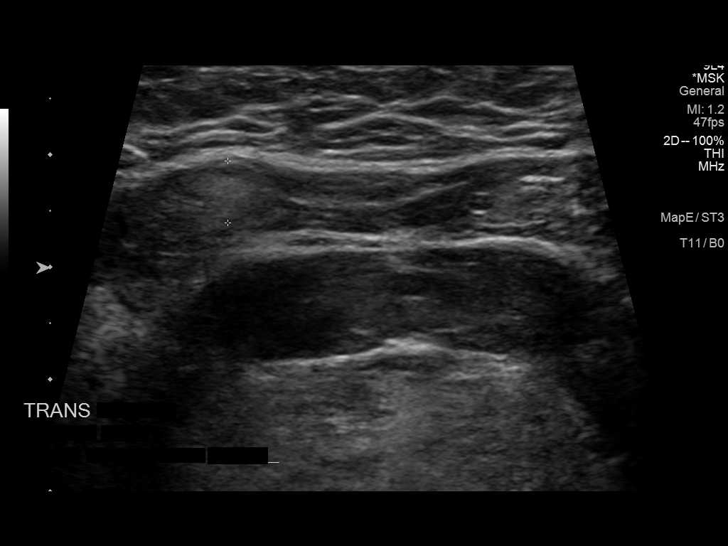
[im 6/17]
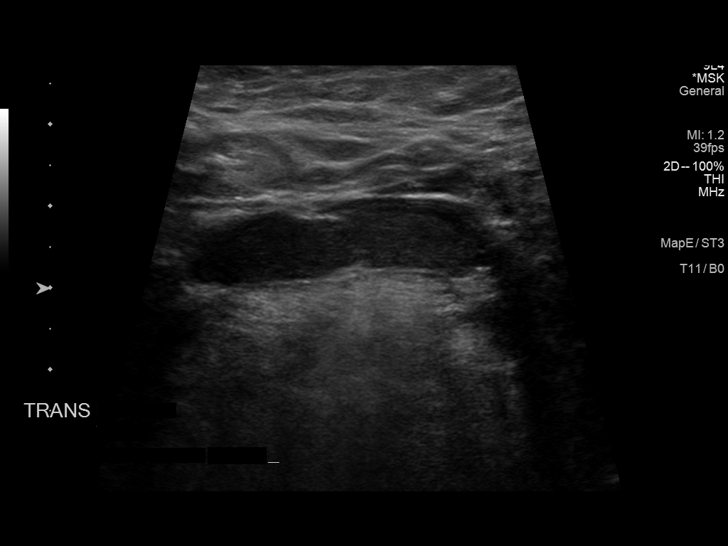
[im 7/17]
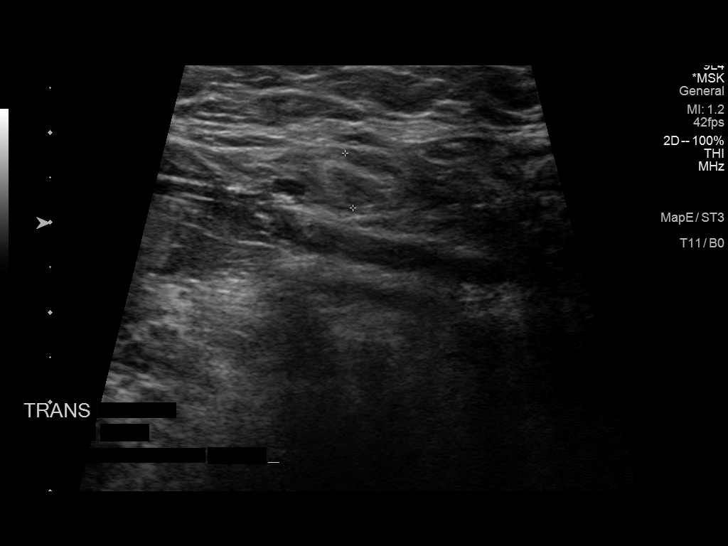
[im 8/17]
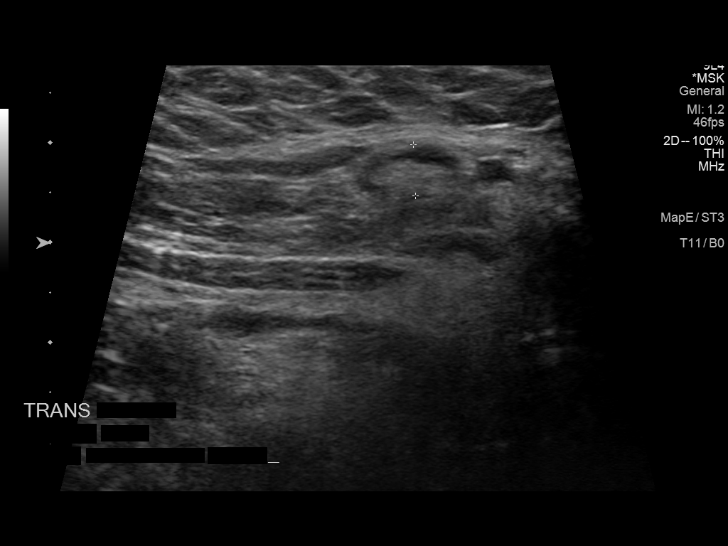
[im 10/17]
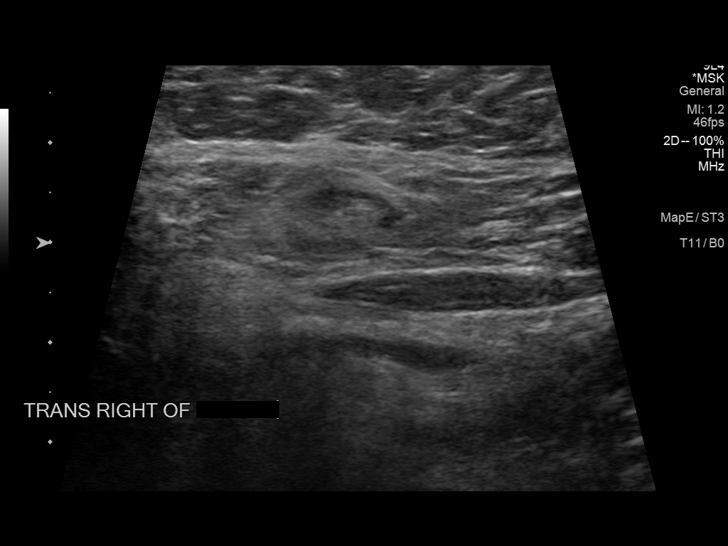
[im 11/17]
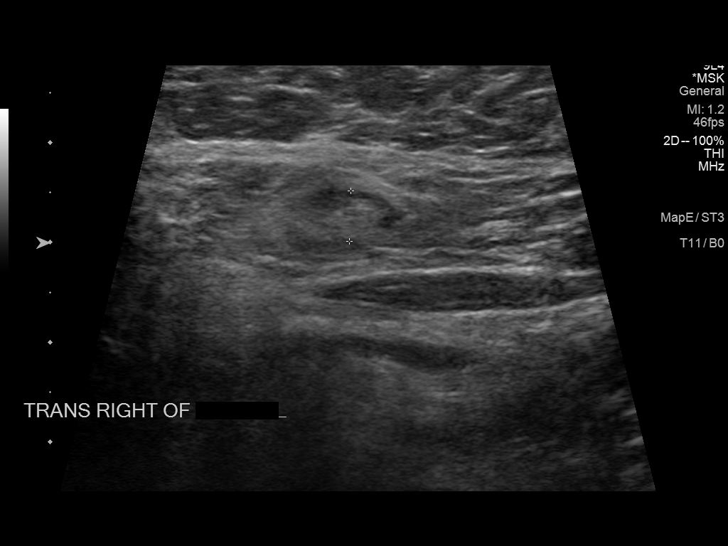
[im 12/17]
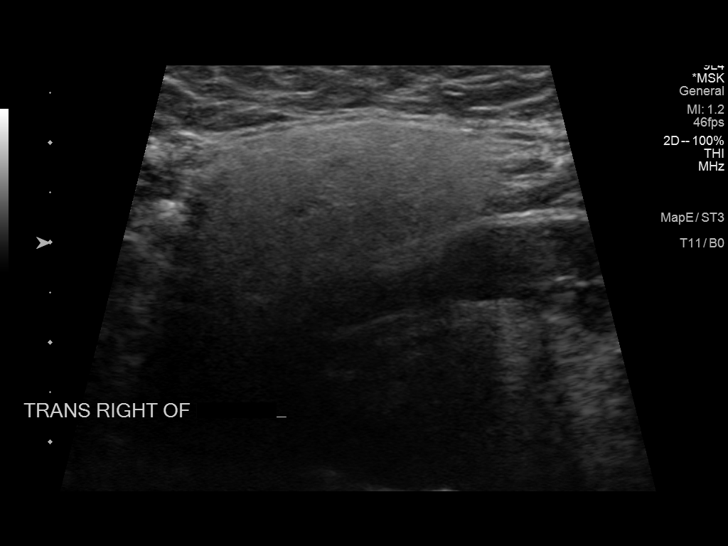
[im 13/17]
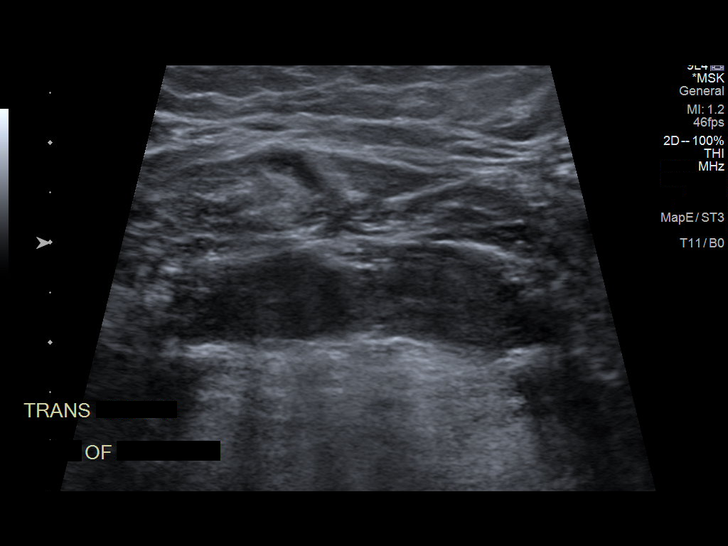
[im 14/17]
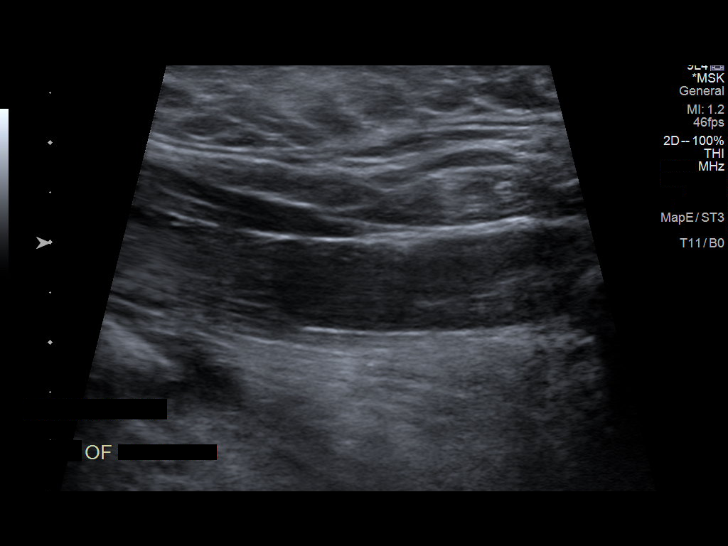
[im 16/17]
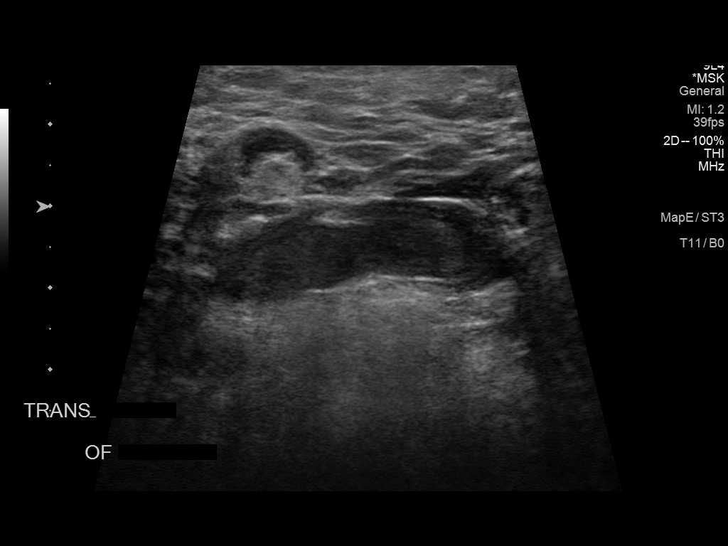
[im 17/17]
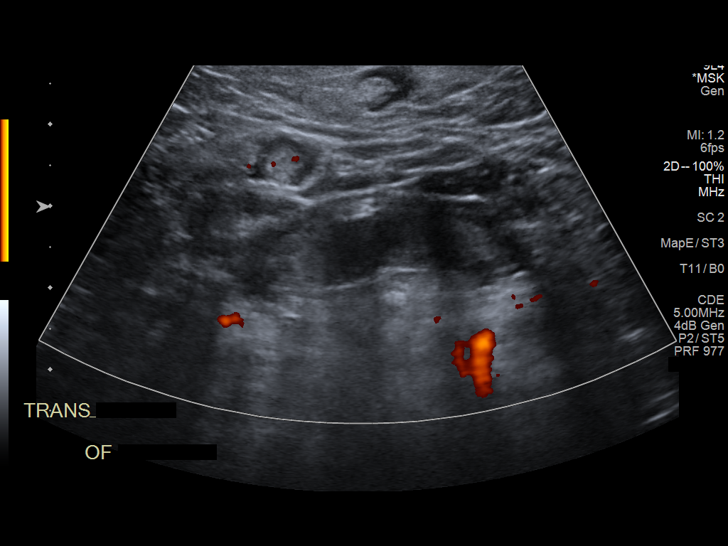

[14 of 17 positions shown; findings below may reference images not displayed]

FINDINGS: No worrisome asymmetry. The fascial planes and strap muscles are
symmetric. Normal sized lymph nodes are present in the subcutaneous
space.
IMPRESSION: No asymmetry or masslike finding.

## 2019-08-21 ENCOUNTER — Other Ambulatory Visit: Payer: Self-pay

## 2019-08-21 ENCOUNTER — Ambulatory Visit (INDEPENDENT_AMBULATORY_CARE_PROVIDER_SITE_OTHER): Payer: BC Managed Care – PPO | Admitting: Allergy

## 2019-08-21 ENCOUNTER — Encounter: Payer: Self-pay | Admitting: Allergy

## 2019-08-21 VITALS — BP 122/88 | HR 86 | Temp 97.3°F | Resp 20

## 2019-08-21 DIAGNOSIS — L239 Allergic contact dermatitis, unspecified cause: Secondary | ICD-10-CM | POA: Diagnosis not present

## 2019-08-21 NOTE — Assessment & Plan Note (Signed)
TRUE patches placed today on the back.

## 2019-08-21 NOTE — Patient Instructions (Signed)
.   Patches placed today. . Please avoid strenuous physical activities and do not get the patches on the back wet. No showering until final patch reading done. . Okay to take antihistamines for itching but avoid placing any creams on the back where the patches are. . We will remove the patches on Wednesday and will do our initial read. . Then you will come back on Friday for a final read. 

## 2019-08-21 NOTE — Progress Notes (Signed)
   Follow Up Note  RE: Michele Hall. Capraro MRN: [id number] DOB: [birth year] Date of Office Visit: 08/21/2019  Referring provider: Jinny Sanders, MD Primary care provider: Jinny Sanders, MD  History of Present Illness: I had the pleasure of seeing Michele Hall for a follow up visit at the [facility] on 08/21/2019. She is a 54 y.o. female, who is being followed for contact dermatitis. Today she is here for patch test placement, given suspected history of contact dermatitis.   Diagnostics: TRUE Test patches placed.   Assessment and Plan: Michele Hall is a 54 y.o. female with: Allergic contact dermatitis TRUE patches placed today on the back.   The patient was instructed regarding proper care of the patches for the next 48 hours. Do not get patches wet - avoid showering until the next visit. Do not engage in vigorous physical activity.  Patient will follow up in 48 hours and 96 hours for patch readings.  It was my pleasure to see Michele Hall today and participate in her care. Please feel free to contact me with any questions or concerns.  Sincerely,  Rexene Alberts, DO Allergy & Immunology  [facility] office: [phone] [facility] office: [place] office: [phone]

## 2019-08-23 ENCOUNTER — Ambulatory Visit: Payer: BC Managed Care – PPO | Admitting: Allergy

## 2019-08-23 ENCOUNTER — Other Ambulatory Visit: Payer: Self-pay

## 2019-08-23 ENCOUNTER — Encounter: Payer: BC Managed Care – PPO | Admitting: Allergy & Immunology

## 2019-08-23 ENCOUNTER — Encounter: Payer: Self-pay | Admitting: Allergy

## 2019-08-23 VITALS — BP 118/80 | HR 90 | Temp 97.4°F | Resp 16 | Ht 67.0 in | Wt 264.0 lb

## 2019-08-23 DIAGNOSIS — L239 Allergic contact dermatitis, unspecified cause: Secondary | ICD-10-CM

## 2019-08-23 DIAGNOSIS — L309 Dermatitis, unspecified: Secondary | ICD-10-CM

## 2019-08-23 NOTE — Assessment & Plan Note (Signed)
TRUE patches removed today.  Positive to #7, #15, #19, #28, #36.

## 2019-08-23 NOTE — Progress Notes (Signed)
Follow Up Note  RE: Shikara Priestley. Derusha MRN: [id number] DOB: 1965/06/19 Date of Office Visit: 08/23/2019  Referring provider: Jinny Sanders, MD Primary care provider: Jinny Sanders, MD  History of Present Illness: I had the pleasure of seeing Rechelle Schuler for a follow up visit at the [facility] [place] on 08/23/2019. She is a 54 y.o. female, who is being followed for dermatitis. Today she is here for initial patch test interpretation, given suspected history of contact dermatitis.   Diagnostics:  TRUE TEST 48 hour reading:  7. Colophony  1    15. Carba Mix  --   +/-   19. Methyldibromo Glutaronitrile  --   +/-   28. Gold Sodium Thiosulfate  2   36. 2-Bromo-2-Nitropropane-1,3-diol  1     T.R.U.E. Test - 08/23/19 1100    Time Antigen Placed  1046    Manufacturer  --   [employer] [place]   Lot #  [id number]    Location  Back    Number of Test  36    Reading Interval  Day 3    Panel  Panel 1;Panel 2;Panel 3    1. Nickel Sulfate  0    2. Wool Alcohols  0    3. Neomycin Sulfate  0    4. Potassium Dichromate  0    5. Caine Mix  0    6. Fragrance Mix  0    7. Colophony  1    8. Paraben Mix  0    9. Negative Control  0    10. Balsam of [place]  0    11. Ethylenediamine Dihydrochloride  0    12. Cobalt Dichloride  0    13. p-tert Butylphenol Formaldehyde Resin  0    14. Epoxy Resin  0    15. Carba Mix  --   +/-   16.  Black Rubber Mix  0    17. Cl+ Me-Isothiazolinone  0    18. Quaternium-15  0    19. Methyldibromo Glutaronitrile  --   +/-   20. p-Phenylenediamine  0    21. Formaldehyde  0    22. Mercapto Mix  0    23. Thimerosal  0    24. Thiuram Mix  0    25. Diazolidinyl Urea  0    26. Quinoline Mix  0    27. Tixocortol-21-Pivalate  0    28. Gold Sodium Thiosulfate  2    29. Imidazolidinyl Urea  0    30. Budesonide  0    31. Hydrocortisone-17-Butyrate  0    32. Mercaptobenzothiazole  0    33. Bacitracin  0    34. Parthenolide  0    35. Disperse  Blue 106  0    36. 2-Bromo-2-Nitropropane-1,3-diol  1        Assessment and Plan: Kenidy is a 54 y.o. female with: Chronic dermatitis TRUE patches removed today. Positive to #7, #15, #19, #28, #36.   The patient has been provided detailed information regarding the substances she is sensitive to, as well as products containing the substances.  Meticulous avoidance of these substances is recommended. If avoidance is not possible, the use of barrier creams or lotions is recommended.  If symptoms persist or progress despite meticulous avoidance of chemicals/substances above, dermatology evaluation may be warranted. Return in about 2 days (around 08/25/2019) for Patch reading.  It was my pleasure to see Jovannah today  and participate in her care. Please feel free to contact me with any questions or concerns.  Sincerely,  Rexene Alberts, DO Allergy & Immunology  [facility] office: [phone] [facility] office: [place] office: [phone]

## 2019-08-25 ENCOUNTER — Encounter: Payer: Self-pay | Admitting: Family Medicine

## 2019-08-25 ENCOUNTER — Ambulatory Visit: Payer: BC Managed Care – PPO | Admitting: Family Medicine

## 2019-08-25 ENCOUNTER — Other Ambulatory Visit: Payer: Self-pay

## 2019-08-25 DIAGNOSIS — L253 Unspecified contact dermatitis due to other chemical products: Secondary | ICD-10-CM

## 2019-08-25 NOTE — Patient Instructions (Addendum)
   Diagnostics:   TRUE TEST 96-hour hour reading: positive reaction to #28 (Gold sodium thiosulfate) Plan:   Allergic contact dermatitis - The patient has been provided detailed information regarding the substances she is sensitive to, as well as products containing the substances.   - Meticulous avoidance of these substances is recommended.  - If avoidance is not possible, the use of barrier creams or lotions is recommended. - If symptoms persist or progress despite meticulous avoidance of the substances listed above, a Dermatology Referral may be warranted. - After a warm shower, apply the steroid ointment. Wait 5 minutes and apply Vaseline.  - Information provided about wet wraps and Dupixent. Call the clinic if interested - Consider Elidel, montelukast, and Allegra  - I will email you a list of products that do not contain the allergens that you have tested positive to.   Call the clinic if this treatment plan is not working well for you  Follow up in 6 months or sooner if needed

## 2019-08-25 NOTE — Progress Notes (Addendum)
    Follow-up Note  RE: Michele Hall. Laban MRN: [id number] DOB: 08/17/1965 Date of Office Visit: 08/25/2019  Primary care provider: Jinny Sanders, MD Referring provider: Jinny Sanders, MD   [name] returns to the office today for the final patch test interpretation, given suspected history of contact dermatitis.    Diagnostics:   TRUE TEST 96-hour hour reading:  positive reaction to #28 (Gold sodium thiosulfate)  Plan:   Allergic contact dermatitis - The patient has been provided detailed information regarding the substances she is sensitive to, as well as products containing the substances.   - Meticulous avoidance of these substances is recommended.  - If avoidance is not possible, the use of barrier creams or lotions is recommended. - If symptoms persist or progress despite meticulous avoidance of the substances listed above, a dermatology referral may be warranted. - After a warm shower, apply the steroid ointment. Wait 5 minutes and apply Vaseline.  - Consider Elidel, montelukast, and Allegra  - Information provided about wet wraps and Dupixent. Call the clinic if interested - I will email you a list of products that do not contain the allergens that you have tested positive to.   Call the clinic if this treatment plan is not working well for you  Follow up in 6 months or sooner if needed

## 2019-08-31 ENCOUNTER — Telehealth: Payer: Self-pay

## 2019-08-31 NOTE — Telephone Encounter (Signed)
Call  Is this rash a new and different rash or more of the same? If more of the same.. she may instead what to return to dermatologist.

## 2019-08-31 NOTE — Telephone Encounter (Signed)
Pt said for months ongoing with rash on neck and legs; pt went to allergist and had testing; for 1 wk or so pt has rash on lower back that itches but hurts and pt's husband said looks like chicken pox. Pt said no known injury; no swelling in mouth,lips,tongue or throat and no difficulty bleeding. pt called allergist and was advised to call PCP. Offered pt an appt today at [facility] but pt wants to wait on Dr Diona Browner. Pt scheduled in office appt with Dr Diona Browner on 09/01/19 at 9:20 AM. UC & ED precautions given and pt voiced understanding.

## 2019-09-01 ENCOUNTER — Encounter: Payer: Self-pay | Admitting: Family Medicine

## 2019-09-01 ENCOUNTER — Ambulatory Visit: Payer: BC Managed Care – PPO | Admitting: Family Medicine

## 2019-09-01 ENCOUNTER — Other Ambulatory Visit: Payer: Self-pay

## 2019-09-01 VITALS — BP 104/80 | HR 88 | Temp 97.7°F | Ht 66.5 in | Wt 262.2 lb

## 2019-09-01 DIAGNOSIS — M533 Sacrococcygeal disorders, not elsewhere classified: Secondary | ICD-10-CM | POA: Insufficient documentation

## 2019-09-01 DIAGNOSIS — L309 Dermatitis, unspecified: Secondary | ICD-10-CM | POA: Diagnosis not present

## 2019-09-01 MED ORDER — DICLOFENAC SODIUM 75 MG PO TBEC: 75.0000 mg | DELAYED_RELEASE_TABLET | Freq: Two times a day (BID) | ORAL | 0 refills | Status: DC

## 2019-09-01 MED ORDER — CYCLOBENZAPRINE HCL 10 MG PO TABS: 10.0000 mg | ORAL_TABLET | Freq: Every evening | ORAL | 0 refills | Status: DC | PRN

## 2019-09-01 NOTE — Patient Instructions (Addendum)
Started Elidel cream for skin rash.  If not improving can also try iSngulair.  Stop   Ibuprofen.  Start diclofenac twice daily  X 1-2 weeks  Start home physical therapy.  Can use muscle relaxant at night.

## 2019-09-01 NOTE — Assessment & Plan Note (Signed)
Not clearly associated with rash. No suggestion of shingles.. rash crosses midline and is similar to chronic dermatitis rash she has been dealing with for 1 year.   Treat with home PT, heat, NSAIDs and muscle relaxant.

## 2019-09-01 NOTE — Assessment & Plan Note (Signed)
Allergist feels most consistent with atopic derm... recommended Elidel. She has not started that or singulair yet.  Considering dupixent.

## 2019-09-01 NOTE — Telephone Encounter (Signed)
Spoke with Estill Bamberg.  She states her husband says it looks like chicken pox and she is having a lot of pain.  She will keep her appointment here this morning at 9:20 am.

## 2019-09-01 NOTE — Progress Notes (Signed)
Chief Complaint  Patient presents with  . Rash    Lower Back    History of Present Illness: HPI    54 year old female with recent history of chronic dermatitis x 1 year presents with  rash on lower back as well as new onset sacral low back pain. She first noted the rash appear in last 2 weeks on low back .. both sides Rash is not painful just itchy... associated with low back pain.  No blisters.. red bumps with central scab    She recently had allergy testing on top of back. Found allergic to several things. No food. Allergist felt  Like atopic dermatitis... recommended  Elidel.. she has not picked it up yet.  She has started having low back pain in last 4-5 days. Pain over  Sacrum and tailbone where she sits. More so on right side.  No recent issue with fissure.  no radiation of pain to legs, no numbness, no weakness.  no fall, no injuries. Ibuprofen does not help much  BP Readings from Last 3 Encounters:  09/01/19 104/80  08/23/19 118/80  08/21/19 122/88     This visit occurred during the SARS-CoV-2 public health emergency.  Safety protocols were in place, including screening questions prior to the visit, additional usage of staff PPE, and extensive cleaning of exam room while observing appropriate contact time as indicated for disinfecting solutions.   COVID 19 screen:  No recent travel or known exposure to COVID19 The patient denies respiratory symptoms of COVID 19 at this time. The importance of social distancing was discussed today.     Review of Systems  Constitutional: Negative for chills and fever.  HENT: Negative for congestion and ear pain.   Eyes: Negative for pain and redness.  Respiratory: Negative for cough and shortness of breath.   Cardiovascular: Negative for chest pain, palpitations and leg swelling.  Gastrointestinal: Negative for abdominal pain, blood in stool, constipation, diarrhea, nausea and vomiting.  Genitourinary: Negative for dysuria.   Musculoskeletal: Positive for back pain. Negative for falls and myalgias.  Skin: Positive for itching and rash.  Neurological: Negative for dizziness.  Psychiatric/Behavioral: Negative for depression. The patient is not nervous/anxious.       Past Medical History:  Diagnosis Date  . Anal fissure - posterior 2015  . Anemia   . Gallstones   . GERD (gastroesophageal reflux disease)   . Hypertension   . IBS   . Kidney stones   . Morbid obesity ([place])     reports that she quit smoking about 17 years ago. She has never used smokeless tobacco. She reports that she does not drink alcohol or use drugs.   Current Outpatient Medications:  .  cyanocobalamin 1000 MCG tablet, Take 1,000 mcg by mouth daily., Disp: , Rfl:  .  fexofenadine (ALLEGRA) 180 MG tablet, Take 180 mg by mouth daily as needed for allergies or rhinitis., Disp: , Rfl:  .  hydrOXYzine (VISTARIL) 25 MG capsule, Take 25-50 mg by mouth at bedtime., Disp: , Rfl:  .  spironolactone (ALDACTONE) 50 MG tablet, TAKE 1 TABLET BY MOUTH EVERY DAY, Disp: 90 tablet, Rfl: 1 .  montelukast (SINGULAIR) 10 MG tablet, Take 1 tablet (10 mg total) by mouth at bedtime. (Patient not taking: Reported on 08/16/2019), Disp: 30 tablet, Rfl: 3 .  pimecrolimus (ELIDEL) 1 % cream, Apply topically 2 (two) times daily. (Patient not taking: Reported on 08/23/2019), Disp: 30 g, Rfl: 5   Observations/Objective: Blood pressure 104/80, pulse 88,  temperature 97.7 F (36.5 C), temperature source Temporal, height 5' 6.5" (1.689 m), weight 262 lb 4 oz (119 kg), SpO2 96 %.  Physical Exam Constitutional:      General: She is not in acute distress.    Appearance: Normal appearance. She is well-developed. She is not ill-appearing or toxic-appearing.  HENT:     Head: Normocephalic.     Right Ear: Hearing, tympanic membrane, ear canal and external ear normal. Tympanic membrane is not erythematous, retracted or bulging.     Left Ear: Hearing, tympanic membrane, ear canal  and external ear normal. Tympanic membrane is not erythematous, retracted or bulging.     Nose: No mucosal edema or rhinorrhea.     Right Sinus: No maxillary sinus tenderness or frontal sinus tenderness.     Left Sinus: No maxillary sinus tenderness or frontal sinus tenderness.     Mouth/Throat:     Pharynx: Uvula midline.  Eyes:     General: Lids are normal. Lids are everted, no foreign bodies appreciated.     Conjunctiva/sclera: Conjunctivae normal.     Pupils: Pupils are equal, round, and reactive to light.  Neck:     Thyroid: No thyroid mass or thyromegaly.     Vascular: No carotid bruit.     Trachea: Trachea normal.  Cardiovascular:     Rate and Rhythm: Normal rate and regular rhythm.     Pulses: Normal pulses.     Heart sounds: Normal heart sounds, S1 normal and S2 normal. No murmur. No friction rub. No gallop.   Pulmonary:     Effort: Pulmonary effort is normal. No tachypnea or respiratory distress.     Breath sounds: Normal breath sounds. No decreased breath sounds, wheezing, rhonchi or rales.  Abdominal:     General: Bowel sounds are normal.     Palpations: Abdomen is soft.     Tenderness: There is no abdominal tenderness.  Musculoskeletal:     Cervical back: Normal, normal range of motion and neck supple.     Thoracic back: Normal.     Lumbar back: Decreased range of motion. Negative right straight leg raise test and negative left straight leg raise test.     Comments: ttp over right SI joint, pain with sitting on buttocks directly and with faber's test.  Skin:    General: Skin is warm and dry.     Findings: No rash.     Comments:  Erythematous plaque on anterior legs shins, improved some  excoriations and scabs on red background scattered across low back, no blisters , no vesicles.  Neurological:     Mental Status: She is alert.  Psychiatric:        Mood and Affect: Mood is not anxious or depressed.        Speech: Speech normal.        Behavior: Behavior normal.  Behavior is cooperative.        Thought Content: Thought content normal.        Judgment: Judgment normal.      Assessment and Plan Sacro-iliac pain Not clearly associated with rash. No suggestion of shingles.. rash crosses midline and is similar to chronic dermatitis rash she has been dealing with for 1 year.   Treat with home PT, heat, NSAIDs and muscle relaxant.  Chronic dermatitis Allergist feels most consistent with atopic derm... recommended Elidel. She has not started that or singulair yet.  Considering dupixent.       Eliezer Lofts, MD

## 2019-09-04 NOTE — Telephone Encounter (Signed)
Per chart review tab pt had visit on 05/09/19.

## 2019-09-06 ENCOUNTER — Other Ambulatory Visit: Payer: Self-pay | Admitting: Family Medicine

## 2019-09-25 ENCOUNTER — Emergency Department (HOSPITAL_COMMUNITY)
Admission: EM | Admit: 2019-09-25 | Discharge: 2019-09-25 | Disposition: A | Discharge status: Home / Self Care | Payer: BC Managed Care – PPO | Attending: Emergency Medicine | Admitting: Emergency Medicine

## 2019-09-25 ENCOUNTER — Other Ambulatory Visit: Payer: Self-pay

## 2019-09-25 ENCOUNTER — Encounter (HOSPITAL_COMMUNITY): Payer: Self-pay | Admitting: *Deleted

## 2019-09-25 DIAGNOSIS — Z79899 Other long term (current) drug therapy: Secondary | ICD-10-CM | POA: Insufficient documentation

## 2019-09-25 DIAGNOSIS — L309 Dermatitis, unspecified: Secondary | ICD-10-CM

## 2019-09-25 DIAGNOSIS — Y9389 Activity, other specified: Secondary | ICD-10-CM | POA: Insufficient documentation

## 2019-09-25 DIAGNOSIS — M7022 Olecranon bursitis, left elbow: Secondary | ICD-10-CM | POA: Insufficient documentation

## 2019-09-25 DIAGNOSIS — I10 Essential (primary) hypertension: Secondary | ICD-10-CM | POA: Diagnosis not present

## 2019-09-25 DIAGNOSIS — R21 Rash and other nonspecific skin eruption: Secondary | ICD-10-CM | POA: Diagnosis present

## 2019-09-25 DIAGNOSIS — L259 Unspecified contact dermatitis, unspecified cause: Secondary | ICD-10-CM | POA: Diagnosis not present

## 2019-09-25 DIAGNOSIS — Z87891 Personal history of nicotine dependence: Secondary | ICD-10-CM | POA: Insufficient documentation

## 2019-09-25 MED ORDER — DOXYCYCLINE HYCLATE 100 MG PO CAPS: 100.0000 mg | ORAL_CAPSULE | Freq: Two times a day (BID) | ORAL | 0 refills | Status: DC

## 2019-09-25 MED ORDER — OXYCODONE-ACETAMINOPHEN 5-325 MG PO TABS: 1.0000 | ORAL_TABLET | Freq: Three times a day (TID) | ORAL | 0 refills | Status: DC | PRN

## 2019-09-25 MED ORDER — PREDNISONE 10 MG PO TABS: 50.0000 mg | ORAL_TABLET | Freq: Every day | ORAL | 0 refills | Status: DC

## 2019-09-25 NOTE — Discharge Instructions (Addendum)
We saw in the ER for elbow pain and the rash.  For the elbow pain we recommend that you apply ice 4 times a day for 10 minutes.  Additionally keep the elastic bandage on at all times.  We suspect that you are having bursitis as the cause, and it should get better with ibuprofen and ice.  Take the additional pain medication only if the pain is severe.  Additionally you also have likely flareup of your dermatitis or eczema.  There could be superimposed infection, please take the antibiotics prescribed.  We would like for you to follow-up with your primary care doctor in 3 days.  If unable to secure an appointment, return to the ER.

## 2019-09-25 NOTE — ED Triage Notes (Signed)
Pt has had a rash for a long time. Has worsening on legs, back  and redness to left elbow. States nothing has helped.

## 2019-09-25 NOTE — ED Notes (Signed)
An After Visit Summary was printed and given to the patient. Discharge instructions given and no further questions at this time.  

## 2019-09-25 NOTE — ED Provider Notes (Signed)
[facility] DEPT Provider Note   CSN: [id number] Arrival date & time: 09/25/19  1200     History Chief Complaint  Patient presents with  . Rash  . Elbow Pain    Michele Hall. Salik is a 54 y.o. female.  HPI     54 year old female comes in a chief complaint of rash and elbow pain.  Patient has history of chronic dermatitis for which she has been seeing her PCP, dermatologist and also an allergist.  Currently they are treating her like dermatitis per medical records.  Patient reports that she chronically has a rash in both of her legs, back -however more recently she has noticed increased breakthrough in those areas.  There is some red spots,similar to "chickenpox" rash that have developed.  She also complains of pain in her elbow.  Her elbow pain started yesterday and it is excruciating with any kind of movement.  She has no history of gout.  Patient denies any trauma.  She has never had any issues with the left elbow.  Past Medical History:  Diagnosis Date  . Anal fissure - posterior 2015  . Anemia   . Gallstones   . GERD (gastroesophageal reflux disease)   . Hypertension   . IBS   . Kidney stones   . Morbid obesity [employer])     Patient Active Problem List   Diagnosis Date Noted  . Sacro-iliac pain 09/01/2019  . Chronic dermatitis 08/15/2019  . Nodule of neck 08/15/2019  . Hordeolum externum of left lower eyelid 05/29/2019  . Cellulitis 05/09/2019  . Pruritus 04/27/2019  . 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 Routine general medical examination at a health care facility 01/22/2019  . Rash and nonspecific skin eruption 01/22/2019  . Allergic contact dermatitis 11/15/2018  . Posterior epistaxis 08/11/2018  . Cervical cancer screening 12/02/2017  . Hot flashes 12/02/2017  . Abnormal heart rhythm 04/23/2016  . Snoring 02/05/2015  . Allergic rhinitis due to allergen 04/04/2014  . Anal fissure - posterior 09/06/2013  . Abdominal bloating  07/11/2013  . ASCUS pap smear with negative high rish risk HPV 07/08/2012  . Sinusitis 10/01/2011  . History of nephrolithiasis 10/27/2010  . Vitamin B 12 deficiency 07/18/2010  . RESTLESS LEG SYNDROME 05/07/2010  . Hyperlipidemia 10/27/2006  . OBESITY 10/27/2006  . Essential hypertension, benign 10/27/2006  . GERD 10/27/2006  . IBS 10/27/2006    Past Surgical History:  Procedure Laterality Date  . CHOLECYSTECTOMY    . DILITATION & CURRETTAGE/HYSTROSCOPY WITH HYDROTHERMAL ABLATION N/A 09/13/2012   Procedure: DILATATION & CURETTAGE/HYSTEROSCOPY WITH HYDROTHERMAL ABLATION;  Surgeon: Osborne Oman, MD;  Location: [facility];  Service: Gynecology;  Laterality: N/A;  . TUBAL LIGATION       OB History    Gravida  2   Para  2   Term  2   Preterm      AB      Living  2     SAB      TAB      Ectopic      Multiple      Live Births              Family History  Problem Relation Age of Onset  . Stroke Mother   . Kidney disease Father   . Coronary artery disease Father   . Aneurysm Father        AAA  . Hypertension Father   . Seizures Sister   . Heart attack  Maternal Grandfather   . Cancer Sister        ovarian/uterus  . Tongue cancer Sister   . Colon cancer Neg Hx   . Stomach cancer Neg Hx   . Urticaria Neg Hx   . Immunodeficiency Neg Hx   . Eczema Neg Hx   . Atopy Neg Hx   . Asthma Neg Hx   . Angioedema Neg Hx   . Allergic rhinitis Neg Hx     Social History   Tobacco Use  . Smoking status: Former Smoker    Quit date: 02/09/2002    Years since quitting: 17.6  . Smokeless tobacco: Never Used  Substance Use Topics  . Alcohol use: No  . Drug use: No    Home Medications Prior to Admission medications   Medication Sig Start Date End Date Taking? Authorizing Provider  cyanocobalamin 1000 MCG tablet Take 1,000 mcg by mouth daily.    [provider]  cyclobenzaprine (FLEXERIL) 10 MG tablet Take 1 tablet (10 mg total) by mouth at bedtime as  needed for muscle spasms. 09/01/19   Bedsole, Amy E, MD  diclofenac (VOLTAREN) 75 MG EC tablet Take 1 tablet (75 mg total) by mouth 2 (two) times daily. 09/01/19   Bedsole, Amy E, MD  fexofenadine (ALLEGRA) 180 MG tablet Take 180 mg by mouth daily as needed for allergies or rhinitis.    [provider]  hydrOXYzine (VISTARIL) 25 MG capsule Take 25-50 mg by mouth at bedtime. 07/29/19   [provider]  montelukast (SINGULAIR) 10 MG tablet Take 1 tablet (10 mg total) by mouth at bedtime. Patient not taking: Reported on 08/16/2019 08/15/19   Jinny Sanders, MD  pimecrolimus (ELIDEL) 1 % cream Apply topically 2 (two) times daily. Patient not taking: Reported on 08/23/2019 08/16/19   Kennith Gain, MD  spironolactone (ALDACTONE) 50 MG tablet TAKE 1 TABLET BY MOUTH EVERY DAY 09/07/19   Jinny Sanders, MD    Allergies    Requip [ropinirole hcl]  Review of Systems   Review of Systems  Constitutional: Positive for activity change.  Gastrointestinal: Negative for nausea and vomiting.  Musculoskeletal: Positive for arthralgias.  Skin: Positive for rash.  Allergic/Immunologic: Negative for immunocompromised state.    Physical Exam Updated Vital Signs BP (!) 131/97   Pulse 79   Temp 97.7 F (36.5 C) (Oral)   Resp 18   Ht 5\' 6"  (1.676 m)   Wt 116.1 kg   SpO2 98%   BMI 41.32 kg/m   Physical Exam Vitals and nursing note reviewed.  Constitutional:      Appearance: She is well-developed.  HENT:     Head: Atraumatic.  Cardiovascular:     Rate and Rhythm: Normal rate.  Pulmonary:     Effort: Pulmonary effort is normal.  Abdominal:     General: Bowel sounds are normal.  Musculoskeletal:        General: Swelling and tenderness present.     Comments: Patient has edematous olecranon process with erythema.  There is tenderness to palpation.  The area is warm to touch.  Patient is able to flex the elbow.    Skin:    General: Skin is warm and dry.     Findings:  Erythema, lesion and rash present.     Comments: Patient has a scaly appearing lower extremity erythematous rash with some ulcerated macules.  There is one lesion that is over the superior left shin that is larger and more erythematous.  She has similar breakout macular lesions over her back.  Neurological:     Mental Status: She is alert and oriented to person, place, and time.     ED Results / Procedures / Treatments   Labs (all labs ordered are listed, but only abnormal results are displayed) Labs Reviewed - No data to display  EKG None  Radiology No results found.  Procedures Procedures (including critical care time)  Medications Ordered in ED Medications - No data to display  ED Course  I have reviewed the triage vital signs and the nursing notes.  Pertinent labs & imaging results that were available during my care of the patient were reviewed by me and considered in my medical decision making (see chart for details).    MDM Rules/Calculators/A&P                      54 year old female comes in a chief complaint of rash and elbow pain.  It appears that she is having 2 separate processes.  The elbow pain is likely because of bursitis.  Clinically there is no septic joint.  Patient is able to flex her elbow.  She has no reason to have a bursitis however.  Therefore we have decided to treat her with pain medications, compression and repeat evaluation if she is not getting better.  Additionally she has a worsening rash that appears to be related to her dermatitis versus eczema.  There is a possibility of superimposed infection and we will give patient antibiotics.  She has been advised to follow-up with her PCP.  Patient was unable to get in because the PCP is out of the clinic.  If she is able to secure an appointment then we would like her to be seen this week.  If she is unable to secure the appointment then we want her to return in 3 days.  Final Clinical Impression(s) /  ED Diagnoses Final diagnoses:  Eczema, unspecified type  Olecranon bursitis of left elbow    Rx / DC Orders ED Discharge Orders    None       Varney Biles, MD 09/25/19 1545

## 2019-09-26 ENCOUNTER — Ambulatory Visit: Payer: BC Managed Care – PPO | Admitting: Family Medicine

## 2019-09-26 ENCOUNTER — Encounter: Payer: Self-pay | Admitting: Family Medicine

## 2019-09-26 VITALS — BP 120/80 | HR 119 | Temp 99.0°F | Ht 66.5 in | Wt 256.2 lb

## 2019-09-26 DIAGNOSIS — M25422 Effusion, left elbow: Secondary | ICD-10-CM | POA: Diagnosis not present

## 2019-09-26 DIAGNOSIS — L03818 Cellulitis of other sites: Secondary | ICD-10-CM | POA: Diagnosis not present

## 2019-09-26 DIAGNOSIS — M25522 Pain in left elbow: Secondary | ICD-10-CM | POA: Diagnosis not present

## 2019-09-26 LAB — COMPREHENSIVE METABOLIC PANEL
ALT: 22 U/L (ref 0–35)
AST: 17 U/L (ref 0–37)
Albumin: 4.4 g/dL (ref 3.5–5.2)
Alkaline Phosphatase: 55 U/L (ref 39–117)
BUN: 11 mg/dL (ref 6–23)
CO2: 25 mEq/L (ref 19–32)
Calcium: 9.4 mg/dL (ref 8.4–10.5)
Chloride: 103 mEq/L (ref 96–112)
Creatinine, Ser: 0.66 mg/dL (ref 0.40–1.20)
GFR: 93.8 mL/min (ref >60.00)
Glucose, Bld: 116 mg/dL — ABNORMAL HIGH (ref 70–99)
Potassium: 3.5 mEq/L (ref 3.5–5.1)
Sodium: 136 mEq/L (ref 135–145)
Total Bilirubin: 0.5 mg/dL (ref 0.2–1.2)
Total Protein: 7.6 g/dL (ref 6.0–8.3)

## 2019-09-26 LAB — CBC WITH DIFFERENTIAL/PLATELET
Basophils Absolute: 0 10*3/uL (ref 0.0–0.1)
Basophils Relative: 0.4 % (ref 0.0–3.0)
Eosinophils Absolute: 0.5 10*3/uL (ref 0.0–0.7)
Eosinophils Relative: 4.6 % (ref 0.0–5.0)
HCT: 41.2 % (ref 36.0–46.0)
Hemoglobin: 14.5 g/dL (ref 12.0–15.0)
Lymphocytes Relative: 15.7 % (ref 12.0–46.0)
Lymphs Abs: 1.5 10*3/uL (ref 0.7–4.0)
MCHC: 35.2 g/dL (ref 30.0–36.0)
MCV: 94.2 fl (ref 78.0–100.0)
Monocytes Absolute: 1.1 10*3/uL — ABNORMAL HIGH (ref 0.1–1.0)
Monocytes Relative: 10.9 % (ref 3.0–12.0)
Neutro Abs: 6.7 10*3/uL (ref 1.4–7.7)
Neutrophils Relative %: 68.4 % (ref 43.0–77.0)
Platelets: 224 10*3/uL (ref 150.0–400.0)
RBC: 4.38 Mil/uL (ref 3.87–5.11)
RDW: 12.4 % (ref 11.5–15.5)
WBC: 9.7 10*3/uL (ref 4.0–10.5)

## 2019-09-26 LAB — URIC ACID: Uric Acid, Serum: 4.9 mg/dL (ref 2.4–7.0)

## 2019-09-26 MED ORDER — PIMECROLIMUS 1 % EX CREA: TOPICAL_CREAM | Freq: Two times a day (BID) | CUTANEOUS | 5 refills | Status: DC

## 2019-09-26 MED ORDER — CEFTRIAXONE SODIUM 1 G IJ SOLR
1.0000 g | Freq: Once | INTRAMUSCULAR | Status: AC
  Administered 2019-09-26: 1 g via INTRAMUSCULAR

## 2019-09-26 NOTE — Patient Instructions (Addendum)
Go to [facility] as directed.  Start ASAP doxycycline.  Start prednisone as directed. Start elidel now on chronic rash.

## 2019-09-26 NOTE — Addendum Note (Signed)
Addended by: Carter Kitten on: 09/26/2019 03:59 PM   Modules accepted: Orders

## 2019-09-26 NOTE — Addendum Note (Signed)
Addended by: Cloyd Stagers on: 09/26/2019 04:17 PM   Modules accepted: Orders

## 2019-09-26 NOTE — Progress Notes (Signed)
Chief Complaint  Patient presents with  . Follow-up    ER Visit     History of Present Illness: HPI   54 year old female with chronic dermatitis presents for ER follow up on new left elbow pain and redness as well as 2 new skin changes ( one on upper back and one on left lower leg)   She was seen in ER yesterday for left olecranon bursitis as well as her chronic dermatitis showed possible bacterial superinfeciton.  She was started on  Doxycyline 100 ,mg BID x 10 days, prednisone  as well as oxycodone. * She has not started any of these medications.*  She feels that left elbow is worsening pain swelling and redness.. significantly in last 24 hours. She cannot bend her left elbow and it is exquisitely tender.  She has area of redness on left anterior lower leg as well as upper back... each with central pore ( she has been scratching her chronic rash)  No fever.. some chills.. heart rate is up in last few days. She feels off and on shivering and shaking for months.   Her back pain resolved on its own.  No N/V/ CP/SOB.  Chronic dermatitis: she has not yet started Elidel and Singulair.Marland Kitchen in past only better with prednisone orall  Has seen allergist and dermatologist... biopsy showed chronic dermatitis. She is teareful with frustration at her chronic rash.. ongoing since 2019.   This visit occurred during the SARS-CoV-2 public health emergency.  Safety protocols were in place, including screening questions prior to the visit, additional usage of staff PPE, and extensive cleaning of exam room while observing appropriate contact time as indicated for disinfecting solutions.   COVID 19 screen:  No recent travel or known exposure to COVID19 The patient denies respiratory symptoms of COVID 19 at this time. The importance of social distancing was discussed today.     Review of Systems  Constitutional: Positive for chills. Negative for fever.  HENT: Negative for congestion and ear pain.    Eyes: Negative for pain and redness.  Respiratory: Negative for cough and shortness of breath.   Cardiovascular: Negative for chest pain, palpitations and leg swelling.  Gastrointestinal: Negative for abdominal pain, blood in stool, constipation, diarrhea, nausea and vomiting.  Genitourinary: Negative for dysuria.  Musculoskeletal: Positive for joint pain. Negative for falls and myalgias.  Skin: Positive for rash.  Neurological: Negative for dizziness.  Psychiatric/Behavioral: Negative for depression. The patient is not nervous/anxious.       Past Medical History:  Diagnosis Date  . Anal fissure - posterior 2015  . Anemia   . Gallstones   . GERD (gastroesophageal reflux disease)   . Hypertension   . IBS   . Kidney stones   . Morbid obesity ([place])     reports that she quit smoking about 17 years ago. She has never used smokeless tobacco. She reports that she does not drink alcohol or use drugs.   Current Outpatient Medications:  .  cyanocobalamin 1000 MCG tablet, Take 1,000 mcg by mouth daily., Disp: , Rfl:  .  hydrOXYzine (VISTARIL) 25 MG capsule, Take 25-50 mg by mouth at bedtime., Disp: , Rfl:  .  spironolactone (ALDACTONE) 50 MG tablet, TAKE 1 TABLET BY MOUTH EVERY DAY, Disp: 90 tablet, Rfl: 1 .  cyclobenzaprine (FLEXERIL) 10 MG tablet, Take 1 tablet (10 mg total) by mouth at bedtime as needed for muscle spasms. (Patient not taking: Reported on 09/26/2019), Disp: 15 tablet, Rfl: 0 .  diclofenac (VOLTAREN) 75 MG EC tablet, Take 1 tablet (75 mg total) by mouth 2 (two) times daily. (Patient not taking: Reported on 09/26/2019), Disp: 30 tablet, Rfl: 0 .  doxycycline (VIBRAMYCIN) 100 MG capsule, Take 1 capsule (100 mg total) by mouth 2 (two) times daily. (Patient not taking: Reported on 09/26/2019), Disp: 14 capsule, Rfl: 0 .  fexofenadine (ALLEGRA) 180 MG tablet, Take 180 mg by mouth daily as needed for allergies or rhinitis., Disp: , Rfl:  .  montelukast (SINGULAIR) 10 MG tablet, Take 1  tablet (10 mg total) by mouth at bedtime. (Patient not taking: Reported on 08/16/2019), Disp: 30 tablet, Rfl: 3 .  oxyCODONE-acetaminophen (PERCOCET/ROXICET) 5-325 MG tablet, Take 1 tablet by mouth every 8 (eight) hours as needed for severe pain. (Patient not taking: Reported on 09/26/2019), Disp: 6 tablet, Rfl: 0 .  pimecrolimus (ELIDEL) 1 % cream, Apply topically 2 (two) times daily. (Patient not taking: Reported on 08/23/2019), Disp: 30 g, Rfl: 5 .  predniSONE (DELTASONE) 10 MG tablet, Take 5 tablets (50 mg total) by mouth daily. (Patient not taking: Reported on 09/26/2019), Disp: 25 tablet, Rfl: 0   Observations/Objective: Blood pressure 120/80, pulse (!) 119, temperature 99 F (37.2 C), temperature source Temporal, height 5' 6.5" (1.689 m), weight 256 lb 4 oz (116.2 kg), SpO2 96 %.  Physical Exam Constitutional:      General: She is not in acute distress.    Appearance: Normal appearance. She is well-developed. She is obese. She is not ill-appearing or toxic-appearing.  HENT:     Head: Normocephalic.     Right Ear: Hearing, tympanic membrane, ear canal and external ear normal. Tympanic membrane is not erythematous, retracted or bulging.     Left Ear: Hearing, tympanic membrane, ear canal and external ear normal. Tympanic membrane is not erythematous, retracted or bulging.     Nose: No mucosal edema or rhinorrhea.     Right Sinus: No maxillary sinus tenderness or frontal sinus tenderness.     Left Sinus: No maxillary sinus tenderness or frontal sinus tenderness.     Mouth/Throat:     Pharynx: Uvula midline.  Eyes:     General: Lids are normal. Lids are everted, no foreign bodies appreciated.     Conjunctiva/sclera: Conjunctivae normal.     Pupils: Pupils are equal, round, and reactive to light.  Neck:     Thyroid: No thyroid mass or thyromegaly.     Vascular: No carotid bruit.     Trachea: Trachea normal.  Cardiovascular:     Rate and Rhythm: Normal rate and regular rhythm.      Pulses: Normal pulses.     Heart sounds: Normal heart sounds, S1 normal and S2 normal. No murmur. No friction rub. No gallop.   Pulmonary:     Effort: Pulmonary effort is normal. No tachypnea or respiratory distress.     Breath sounds: Normal breath sounds. No decreased breath sounds, wheezing, rhonchi or rales.  Abdominal:     General: Bowel sounds are normal.     Palpations: Abdomen is soft.     Tenderness: There is no abdominal tenderness.  Musculoskeletal:     Right elbow: Normal.     Left elbow: Swelling present. Tenderness present in radial head, medial epicondyle, lateral epicondyle and olecranon process.     Cervical back: Normal range of motion and neck supple.  Skin:    General: Skin is warm and dry.     Findings: No rash.     Comments: crhonic  dermatitis changes  On lower legs arms near elbows, upper chest and back. 1 cm nodule with central pore and surrounding erythema on left lower leg and similar lesion on right upper back.. warm, clear discharge.  Left elbow with redness, heat, swelling and stiffness  Neurological:     Mental Status: She is alert.  Psychiatric:        Mood and Affect: Mood is not anxious or depressed.        Speech: Speech normal.        Behavior: Behavior normal. Behavior is cooperative.        Thought Content: Thought content normal.        Judgment: Judgment normal.      Assessment and Plan Cellulitis: left lower leg, rigth upper back and left elbow: Given chills and tachycardia, send for culture.  Given rocephin IM x 1, start doxycycline and ice and prednisone as instructed ASAP.  Will refer to ORTHO STAT to make sure no indication for aspiration of joint/ concern for septic joint.  Before IM antibiotics.Marland Kitchen eval labs for cbc, blood cultures, check uric acid, CMET.  Chronic dermatitis: unclear cause.Marland Kitchen given possible eczema as felt by allergist.. start Elidel.      Eliezer Lofts, MD

## 2019-09-29 ENCOUNTER — Ambulatory Visit: Payer: BC Managed Care – PPO | Admitting: Family Medicine

## 2019-09-29 ENCOUNTER — Encounter: Payer: Self-pay | Admitting: Family Medicine

## 2019-09-29 ENCOUNTER — Other Ambulatory Visit: Payer: Self-pay

## 2019-09-29 DIAGNOSIS — L0390 Cellulitis, unspecified: Secondary | ICD-10-CM | POA: Diagnosis not present

## 2019-09-29 DIAGNOSIS — L309 Dermatitis, unspecified: Secondary | ICD-10-CM

## 2019-09-29 MED ORDER — CEFTRIAXONE SODIUM 1 G IJ SOLR
1.0000 g | Freq: Once | INTRAMUSCULAR | Status: AC
  Administered 2019-09-29: 1 g via INTRAMUSCULAR

## 2019-09-29 NOTE — Progress Notes (Signed)
Chief Complaint  Patient presents with  . Follow-up    History of Present Illness: HPI     54 year old female presents for follow up left elbow cellulitis, left olecranon bursitis and  Leg and back infection in setting of chronic dermatitis. She is is S/P 1 injection of ceftriaxone on 4/20. Started on 09/26/2019 with doxy 10 day course as well as prednisone taper.  She was evaluated by Ortho on 4/20 for  R/o septic joint. They felt there was not clear infeciton in the joint. Noted reviewed in detail.  She had and X-ray of left elbow, placed in a posterior long arm splint. Asked to hold prednisone.  Today she reports she followed up yesterday... felt redness spreading some. Placed back in brace. Had appt this AM.. left arm more swollen, decreased ROM, pain continuing. Redness receeding at elbow but redness down anterior arm ( may be from brace or from swelling)  Taken out of brace today.  F/Up in 1 week.  This visit occurred during the SARS-CoV-2 public health emergency.  Safety protocols were in place, including screening questions prior to the visit, additional usage of staff PPE, and extensive cleaning of exam room while observing appropriate contact time as indicated for disinfecting solutions.   COVID 19 screen:  No recent travel or known exposure to COVID19 The patient denies respiratory symptoms of COVID 19 at this time. The importance of social distancing was discussed today.     Review of Systems  Constitutional: Negative for chills and fever.  HENT: Negative for congestion and ear pain.   Eyes: Negative for pain and redness.  Respiratory: Negative for cough and shortness of breath.   Cardiovascular: Negative for chest pain, palpitations and leg swelling.  Gastrointestinal: Negative for abdominal pain, blood in stool, constipation, diarrhea, nausea and vomiting.  Genitourinary: Negative for dysuria.  Musculoskeletal: Negative for falls and myalgias.  Skin: Negative for  rash.  Neurological: Negative for dizziness.  Psychiatric/Behavioral: Negative for depression. The patient is not nervous/anxious.       Past Medical History:  Diagnosis Date  . Anal fissure - posterior 2015  . Anemia   . Gallstones   . GERD (gastroesophageal reflux disease)   . Hypertension   . IBS   . Kidney stones   . Morbid obesity ([place])     reports that she quit smoking about 17 years ago. She has never used smokeless tobacco. She reports that she does not drink alcohol or use drugs.   Current Outpatient Medications:  .  cyanocobalamin 1000 MCG tablet, Take 1,000 mcg by mouth daily., Disp: , Rfl:  .  doxycycline (VIBRAMYCIN) 100 MG capsule, Take 1 capsule (100 mg total) by mouth 2 (two) times daily., Disp: 14 capsule, Rfl: 0 .  fexofenadine (ALLEGRA) 180 MG tablet, Take 180 mg by mouth daily as needed for allergies or rhinitis., Disp: , Rfl:  .  hydrOXYzine (VISTARIL) 25 MG capsule, Take 25-50 mg by mouth at bedtime., Disp: , Rfl:  .  spironolactone (ALDACTONE) 50 MG tablet, TAKE 1 TABLET BY MOUTH EVERY DAY, Disp: 90 tablet, Rfl: 1 .  cyclobenzaprine (FLEXERIL) 10 MG tablet, Take 1 tablet (10 mg total) by mouth at bedtime as needed for muscle spasms. (Patient not taking: Reported on 09/26/2019), Disp: 15 tablet, Rfl: 0 .  diclofenac (VOLTAREN) 75 MG EC tablet, Take 1 tablet (75 mg total) by mouth 2 (two) times daily. (Patient not taking: Reported on 09/26/2019), Disp: 30 tablet, Rfl: 0 .  montelukast (  SINGULAIR) 10 MG tablet, Take 1 tablet (10 mg total) by mouth at bedtime. (Patient not taking: Reported on 08/16/2019), Disp: 30 tablet, Rfl: 3 .  oxyCODONE-acetaminophen (PERCOCET/ROXICET) 5-325 MG tablet, Take 1 tablet by mouth every 8 (eight) hours as needed for severe pain. (Patient not taking: Reported on 09/26/2019), Disp: 6 tablet, Rfl: 0 .  pimecrolimus (ELIDEL) 1 % cream, Apply topically 2 (two) times daily. (Patient not taking: Reported on 09/29/2019), Disp: 30 g, Rfl: 5 .   predniSONE (DELTASONE) 10 MG tablet, Take 5 tablets (50 mg total) by mouth daily. (Patient not taking: Reported on 09/26/2019), Disp: 25 tablet, Rfl: 0   Observations/Objective: Pulse (!) 105, temperature 98.3 F (36.8 C), temperature source Temporal, height 5' 6.5" (1.689 m), weight 258 lb 4 oz (117.1 kg), SpO2 96 %.  Physical Exam Constitutional:      General: She is not in acute distress.    Appearance: Normal appearance. She is well-developed. She is not ill-appearing or toxic-appearing.  HENT:     Head: Normocephalic.     Right Ear: Hearing, tympanic membrane, ear canal and external ear normal. Tympanic membrane is not erythematous, retracted or bulging.     Left Ear: Hearing, tympanic membrane, ear canal and external ear normal. Tympanic membrane is not erythematous, retracted or bulging.     Nose: No mucosal edema or rhinorrhea.     Right Sinus: No maxillary sinus tenderness or frontal sinus tenderness.     Left Sinus: No maxillary sinus tenderness or frontal sinus tenderness.     Mouth/Throat:     Pharynx: Uvula midline.  Eyes:     General: Lids are normal. Lids are everted, no foreign bodies appreciated.     Conjunctiva/sclera: Conjunctivae normal.     Pupils: Pupils are equal, round, and reactive to light.  Neck:     Thyroid: No thyroid mass or thyromegaly.     Vascular: No carotid bruit.     Trachea: Trachea normal.  Cardiovascular:     Rate and Rhythm: Normal rate and regular rhythm.     Pulses: Normal pulses.     Heart sounds: Normal heart sounds, S1 normal and S2 normal. No murmur. No friction rub. No gallop.   Pulmonary:     Effort: Pulmonary effort is normal. No tachypnea or respiratory distress.     Breath sounds: Normal breath sounds. No decreased breath sounds, wheezing, rhonchi or rales.  Abdominal:     General: Bowel sounds are normal.     Palpations: Abdomen is soft.     Tenderness: There is no abdominal tenderness.  Musculoskeletal:     Cervical back:  Normal range of motion and neck supple.  Skin:    General: Skin is warm and dry.     Findings: No rash.          Comments: Receeding erythema at  Left elbow and slightly improved flexion at elbow. Redness running down arm in linear fashion... diffuse swelling at lower arm  Chronic dermatitis changes remain at legs.  Neurological:     Mental Status: She is alert.  Psychiatric:        Mood and Affect: Mood is not anxious or depressed.        Speech: Speech normal.        Behavior: Behavior normal. Behavior is cooperative.        Thought Content: Thought content normal.        Judgment: Judgment normal.      Assessment and Plan Cellulitis  Improving.. no clear indication for I and D ant leg and back nodule. Will repeat ceftriaxone injection today, continue doxy course. Follow up with ortho for joint re-eval.  If not improving as expected follow up here in 1 week.  Chronic dermatitis Start elidel and then singulair.  Consider referral to tertiary center for oral immunomodulator or phototherapy or further eval for clearer diagnsosis.        Eliezer Lofts, MD

## 2019-09-29 NOTE — Patient Instructions (Addendum)
Make sure not to miss a dose of doxycycline. Elevate arm above head.  Start Elidel on rash... after 1-2 weeks add Singulair.  Follow up with ORTHO next week. Call for earlier follow up here next week if not continuing to improve or questions after ORTHO appt.

## 2019-09-29 NOTE — Assessment & Plan Note (Signed)
Start elidel and then singulair.  Consider referral to tertiary center for oral immunomodulator or phototherapy or further eval for clearer diagnsosis.

## 2019-09-29 NOTE — Assessment & Plan Note (Signed)
Improving.. no clear indication for I and D ant leg and back nodule. Will repeat ceftriaxone injection today, continue doxy course. Follow up with ortho for joint re-eval.  If not improving as expected follow up here in 1 week.

## 2019-10-03 ENCOUNTER — Other Ambulatory Visit: Payer: Self-pay | Admitting: Family Medicine

## 2019-10-03 MED ORDER — DOXYCYCLINE HYCLATE 100 MG PO CAPS: 100.0000 mg | ORAL_CAPSULE | Freq: Two times a day (BID) | ORAL | 0 refills | Status: DC

## 2019-11-07 ENCOUNTER — Other Ambulatory Visit: Payer: Self-pay | Admitting: Family Medicine

## 2020-01-16 ENCOUNTER — Other Ambulatory Visit: Payer: Self-pay | Admitting: Family Medicine

## 2020-03-06 ENCOUNTER — Telehealth: Payer: Self-pay | Admitting: Family Medicine

## 2020-03-06 NOTE — Telephone Encounter (Signed)
Labs 10/26 cpx 10/28 Pt aware

## 2020-03-06 NOTE — Telephone Encounter (Signed)
Please schedule CPE with fasting labs prior with Dr. Bedsole.  

## 2020-03-30 ENCOUNTER — Telehealth: Payer: Self-pay | Admitting: Family Medicine

## 2020-03-30 DIAGNOSIS — I10 Essential (primary) hypertension: Secondary | ICD-10-CM

## 2020-03-30 DIAGNOSIS — E7800 Pure hypercholesterolemia, unspecified: Secondary | ICD-10-CM

## 2020-03-30 DIAGNOSIS — E538 Deficiency of other specified B group vitamins: Secondary | ICD-10-CM

## 2020-03-30 NOTE — Telephone Encounter (Signed)
-----   Message from Cloyd Stagers, RT sent at 03/18/2020  2:28 PM EDT ----- Regarding: Lab Orders for Tuesday 10.26.2021 Please place lab orders for Tuesday 10.26.2021, office visit for physical on Thursday 10.28.2021 Thank you, Dyke Maes RT(R)

## 2020-04-02 ENCOUNTER — Other Ambulatory Visit (INDEPENDENT_AMBULATORY_CARE_PROVIDER_SITE_OTHER): Payer: BC Managed Care – PPO

## 2020-04-02 ENCOUNTER — Other Ambulatory Visit: Payer: Self-pay

## 2020-04-02 DIAGNOSIS — E7800 Pure hypercholesterolemia, unspecified: Secondary | ICD-10-CM

## 2020-04-02 DIAGNOSIS — I10 Essential (primary) hypertension: Secondary | ICD-10-CM | POA: Diagnosis not present

## 2020-04-02 DIAGNOSIS — E538 Deficiency of other specified B group vitamins: Secondary | ICD-10-CM

## 2020-04-02 LAB — VITAMIN B12: Vitamin B-12: 286 pg/mL (ref 211–911)

## 2020-04-02 LAB — COMPREHENSIVE METABOLIC PANEL
ALT: 26 U/L (ref 0–35)
AST: 22 U/L (ref 0–37)
Albumin: 4.2 g/dL (ref 3.5–5.2)
Alkaline Phosphatase: 52 U/L (ref 39–117)
BUN: 14 mg/dL (ref 6–23)
CO2: 28 mEq/L (ref 19–32)
Calcium: 9.1 mg/dL (ref 8.4–10.5)
Chloride: 104 mEq/L (ref 96–112)
Creatinine, Ser: 0.76 mg/dL (ref 0.40–1.20)
GFR: 89.6 mL/min (ref >60.00)
Glucose, Bld: 74 mg/dL (ref 70–99)
Potassium: 4.7 mEq/L (ref 3.5–5.1)
Sodium: 140 mEq/L (ref 135–145)
Total Bilirubin: 0.6 mg/dL (ref 0.2–1.2)
Total Protein: 6.8 g/dL (ref 6.0–8.3)

## 2020-04-02 LAB — CBC WITH DIFFERENTIAL/PLATELET
Basophils Absolute: 0.1 10*3/uL (ref 0.0–0.1)
Basophils Relative: 1.8 % (ref 0.0–3.0)
Eosinophils Absolute: 0.3 10*3/uL (ref 0.0–0.7)
Eosinophils Relative: 4.3 % (ref 0.0–5.0)
HCT: 42 % (ref 36.0–46.0)
Hemoglobin: 14.9 g/dL (ref 12.0–15.0)
Lymphocytes Relative: 29.6 % (ref 12.0–46.0)
Lymphs Abs: 1.9 10*3/uL (ref 0.7–4.0)
MCHC: 35.4 g/dL (ref 30.0–36.0)
MCV: 92.1 fl (ref 78.0–100.0)
Monocytes Absolute: 0.7 10*3/uL (ref 0.1–1.0)
Monocytes Relative: 9.9 % (ref 3.0–12.0)
Neutro Abs: 3.6 10*3/uL (ref 1.4–7.7)
Neutrophils Relative %: 54.4 % (ref 43.0–77.0)
Platelets: 229 10*3/uL (ref 150.0–400.0)
RBC: 4.56 Mil/uL (ref 3.87–5.11)
RDW: 13.1 % (ref 11.5–15.5)
WBC: 6.6 10*3/uL (ref 4.0–10.5)

## 2020-04-02 LAB — LIPID PANEL
Cholesterol: 179 mg/dL (ref 0–200)
HDL: 41.1 mg/dL (ref >39.00)
NonHDL: 137.82
Total CHOL/HDL Ratio: 4
Triglycerides: 209 mg/dL — ABNORMAL HIGH (ref 0.0–149.0)
VLDL: 41.8 mg/dL — ABNORMAL HIGH (ref 0.0–40.0)

## 2020-04-02 LAB — LDL CHOLESTEROL, DIRECT: Direct LDL: 102 mg/dL

## 2020-04-02 NOTE — Progress Notes (Signed)
No critical labs need to be addressed urgently. We will discuss labs in detail at upcoming office visit.   

## 2020-04-04 ENCOUNTER — Ambulatory Visit (INDEPENDENT_AMBULATORY_CARE_PROVIDER_SITE_OTHER): Payer: BC Managed Care – PPO | Admitting: Family Medicine

## 2020-04-04 ENCOUNTER — Other Ambulatory Visit: Payer: Self-pay

## 2020-04-04 ENCOUNTER — Encounter: Payer: Self-pay | Admitting: Family Medicine

## 2020-04-04 VITALS — BP 110/80 | HR 91 | Temp 98.0°F | Ht 66.5 in | Wt 255.0 lb

## 2020-04-04 DIAGNOSIS — I10 Essential (primary) hypertension: Secondary | ICD-10-CM

## 2020-04-04 DIAGNOSIS — Z0000 Encounter for general adult medical examination without abnormal findings: Secondary | ICD-10-CM | POA: Diagnosis not present

## 2020-04-04 DIAGNOSIS — E7800 Pure hypercholesterolemia, unspecified: Secondary | ICD-10-CM | POA: Diagnosis not present

## 2020-04-04 DIAGNOSIS — Z20822 Contact with and (suspected) exposure to covid-19: Secondary | ICD-10-CM | POA: Diagnosis not present

## 2020-04-04 DIAGNOSIS — H6993 Unspecified Eustachian tube disorder, bilateral: Secondary | ICD-10-CM | POA: Insufficient documentation

## 2020-04-04 DIAGNOSIS — E538 Deficiency of other specified B group vitamins: Secondary | ICD-10-CM

## 2020-04-04 DIAGNOSIS — H6983 Other specified disorders of Eustachian tube, bilateral: Secondary | ICD-10-CM

## 2020-04-04 NOTE — Assessment & Plan Note (Signed)
Well controlled. Continue current medication.  

## 2020-04-04 NOTE — Assessment & Plan Note (Signed)
Start flonase 2 sprays per nostril daily.

## 2020-04-04 NOTE — Patient Instructions (Addendum)
Please stop at the lab to have labs drawn.  Please call the location of your choice from the menu below to schedule your Mammogram and/or Bone Density appointment.    [place]   1. [facility]                      Phone:  [phone] [address], [place] [address]                                                             Services: Traditional and 3D Mammogram, Bone Density   2. [facility]                 Phone: [phone] [address], [place] [address]    Service: Bone Density ONLY   *this site does NOT perform mammograms  3. [facility] [place]                        Phone:  [phone] [address] [place], [place] [address]                                            Services:  3D Mammogram and Bone Density    [place]  1. [place] [facility]   Phone:  [phone]   [place], [place] [address]                                            Services: 3D Mammogram and Bone Density  2. [employer] at [facility])  Phone:  [phone]   [address] [place],  [address]  Services:  3D Mammogram and Bone Density

## 2020-04-04 NOTE — Assessment & Plan Note (Signed)
Improved HDL but increased LDL. No current indication for statin

## 2020-04-04 NOTE — Assessment & Plan Note (Signed)
Resolved with supplementation 

## 2020-04-04 NOTE — Progress Notes (Signed)
Chief Complaint  Patient presents with  . Annual Exam    History of Present Illness: HPI  The patient is here for annual wellness exam and preventative care.     Allergies and congestion, bilateral ear pain and pressure.  Hypertension:   Good control on aldactone  BP Readings from Last 3 Encounters:  04/04/20 110/80  09/29/19 112/80  09/26/19 120/80  Using medication without problems or lightheadedness:  none Chest pain with exertion:none Edema:none Short of breath:none Average home BPs: Other issues:  Elevated Cholesterol: Lab Results  Component Value Date   CHOL 179 04/02/2020   HDL 41.10 04/02/2020   LDLCALC 82 10/29/2017   LDLDIRECT 102.0 04/02/2020   TRIG 209.0 (H) 04/02/2020   CHOLHDL 4 04/02/2020  The 10-year ASCVD risk score Mikey Bussing DC Jr., et al., 2013) is: 2%   Values used to calculate the score:     Age: 54 years     Sex: Female     Is Non-Hispanic African American: No     Diabetic: No     Tobacco smoker: No     Systolic Blood Pressure: 235 mmHg     Is BP treated: Yes     HDL Cholesterol: 41.1 mg/dL     Total Cholesterol: 179 mg/dL Using medications without problems: Muscle aches:  Diet compliance:  minimal Exercise:  none Other complaints:   B12 back in nml range  Helps with energy.   Last COVID in 01/2020  This visit occurred during the SARS-CoV-2 public health emergency.  Safety protocols were in place, including screening questions prior to the visit, additional usage of staff PPE, and extensive cleaning of exam room while observing appropriate contact time as indicated for disinfecting solutions.   COVID 19 screen:  No recent travel or known exposure to COVID19 The patient denies respiratory symptoms of COVID 19 at this time. The importance of social distancing was discussed today.     Review of Systems  Constitutional: Negative for chills and fever.  HENT: Negative for congestion and ear pain.   Eyes: Negative for pain and redness.   Respiratory: Negative for cough and shortness of breath.   Cardiovascular: Negative for chest pain, palpitations and leg swelling.  Gastrointestinal: Negative for abdominal pain, blood in stool, constipation, diarrhea, nausea and vomiting.  Genitourinary: Negative for dysuria.  Musculoskeletal: Negative for falls and myalgias.  Skin: Negative for rash.  Neurological: Negative for dizziness.  Psychiatric/Behavioral: Negative for depression. The patient is not nervous/anxious.       Past Medical History:  Diagnosis Date  . Anal fissure - posterior 2015  . Anemia   . Gallstones   . GERD (gastroesophageal reflux disease)   . Hypertension   . IBS   . Kidney stones   . Morbid obesity ([place])     reports that she quit smoking about 18 years ago. She has never used smokeless tobacco. She reports that she does not drink alcohol and does not use drugs.   Current Outpatient Medications:  .  cyanocobalamin 1000 MCG tablet, Take 1,000 mcg by mouth daily., Disp: , Rfl:  .  fexofenadine (ALLEGRA) 180 MG tablet, TAKE 1 TABLET BY MOUTH EVERY DAY, Disp: 90 tablet, Rfl: 3 .  hydrOXYzine (VISTARIL) 25 MG capsule, Take 25-50 mg by mouth at bedtime., Disp: , Rfl:  .  spironolactone (ALDACTONE) 50 MG tablet, TAKE 1 TABLET BY MOUTH EVERY DAY, Disp: 90 tablet, Rfl: 0   Observations/Objective: Blood pressure 110/80, pulse 91, temperature 98 F (36.7 C),  temperature source Temporal, height 5' 6.5" (1.689 m), weight 255 lb (115.7 kg), SpO2 98 %.  Physical Exam Constitutional:      General: She is not in acute distress.    Appearance: Normal appearance. She is well-developed. She is obese. She is not ill-appearing or toxic-appearing.  HENT:     Head: Normocephalic.     Right Ear: Hearing, ear canal and external ear normal. A middle ear effusion is present. Tympanic membrane is not erythematous, retracted or bulging.     Left Ear: Hearing, ear canal and external ear normal. A middle ear effusion is  present. Tympanic membrane is not erythematous, retracted or bulging.     Nose: No mucosal edema or rhinorrhea.     Right Sinus: No maxillary sinus tenderness or frontal sinus tenderness.     Left Sinus: No maxillary sinus tenderness or frontal sinus tenderness.     Mouth/Throat:     Pharynx: Uvula midline.  Eyes:     General: Lids are normal. Lids are everted, no foreign bodies appreciated.     Conjunctiva/sclera: Conjunctivae normal.     Pupils: Pupils are equal, round, and reactive to light.  Neck:     Thyroid: No thyroid mass or thyromegaly.     Vascular: No carotid bruit.     Trachea: Trachea normal.  Cardiovascular:     Rate and Rhythm: Normal rate and regular rhythm.     Pulses: Normal pulses.     Heart sounds: Normal heart sounds, S1 normal and S2 normal. No murmur heard.  No friction rub. No gallop.   Pulmonary:     Effort: Pulmonary effort is normal. No tachypnea or respiratory distress.     Breath sounds: Normal breath sounds. No decreased breath sounds, wheezing, rhonchi or rales.  Abdominal:     General: Bowel sounds are normal.     Palpations: Abdomen is soft.     Tenderness: There is no abdominal tenderness.  Musculoskeletal:     Cervical back: Normal range of motion and neck supple.  Skin:    General: Skin is warm and dry.     Findings: No rash.  Neurological:     Mental Status: She is alert.  Psychiatric:        Mood and Affect: Mood is not anxious or depressed.        Speech: Speech normal.        Behavior: Behavior normal. Behavior is cooperative.        Thought Content: Thought content normal.        Judgment: Judgment normal.      Assessment and Plan   The patient's preventative maintenance and recommended screening tests for an annual wellness exam were reviewed in full today. Brought up to date unless services declined.  Counselled on the importance of diet, exercise, and its role in overall health and mortality. The patient's FH and SH was  reviewed, including their home life, tobacco status, and drug and alcohol status.   Vaccines:Uptodate withTdap, refused flu  Discussed COVID19 vaccine side effects and benefits. Strongly encouraged the patient to get the vaccine. Questions answered. Pap/DVE:nml 2019.. repeat in 5 years. Mammo:last mamm in 2015.. due NOW Bone Density:not indicated Colon:GI Dr. Carlean Purl 09/2013 nml colonoscopy repeat in 2025 Smoking Status: former smoker QUIT 2003 HIV screen:refused No ETOH  Hyperlipidemia  Improved HDL but increased LDL. No current indication for statin  Essential hypertension, benign Well controlled. Continue current medication.   Vitamin B 12 deficiency Resolved with supplementation.  ETD (Eustachian tube dysfunction), bilateral Start flonase 2 sprays per nostril daily.    Eliezer Lofts, MD

## 2020-04-05 LAB — SARS-COV-2 IGG: SARS-COV-2 IgG: 0.35

## 2020-04-10 ENCOUNTER — Emergency Department (HOSPITAL_COMMUNITY): Payer: BC Managed Care – PPO

## 2020-04-10 ENCOUNTER — Encounter (HOSPITAL_COMMUNITY): Payer: Self-pay | Admitting: Emergency Medicine

## 2020-04-10 ENCOUNTER — Emergency Department (HOSPITAL_COMMUNITY)
Admission: EM | Admit: 2020-04-10 | Discharge: 2020-04-10 | Disposition: A | Discharge status: Home / Self Care | Payer: BC Managed Care – PPO | Attending: Emergency Medicine | Admitting: Emergency Medicine

## 2020-04-10 ENCOUNTER — Telehealth: Payer: Self-pay

## 2020-04-10 ENCOUNTER — Other Ambulatory Visit: Payer: Self-pay

## 2020-04-10 DIAGNOSIS — N200 Calculus of kidney: Secondary | ICD-10-CM | POA: Insufficient documentation

## 2020-04-10 DIAGNOSIS — R109 Unspecified abdominal pain: Secondary | ICD-10-CM

## 2020-04-10 DIAGNOSIS — I10 Essential (primary) hypertension: Secondary | ICD-10-CM | POA: Insufficient documentation

## 2020-04-10 DIAGNOSIS — Z87891 Personal history of nicotine dependence: Secondary | ICD-10-CM | POA: Insufficient documentation

## 2020-04-10 LAB — URINALYSIS, ROUTINE W REFLEX MICROSCOPIC
Bilirubin Urine: NEGATIVE
Glucose, UA: NEGATIVE mg/dL
Ketones, ur: 5 mg/dL — AB
Nitrite: NEGATIVE
Protein, ur: 100 mg/dL — AB
RBC / HPF: >50 RBC/hpf — ABNORMAL HIGH (ref 0–5)
Specific Gravity, Urine: 1.013 (ref 1.005–1.030)
WBC, UA: >50 WBC/hpf — ABNORMAL HIGH (ref 0–5)
pH: 6 (ref 5.0–8.0)

## 2020-04-10 LAB — CBC
HCT: 39.2 % (ref 36.0–46.0)
Hemoglobin: 13.9 g/dL (ref 12.0–15.0)
MCH: 32.8 pg (ref 26.0–34.0)
MCHC: 35.5 g/dL (ref 30.0–36.0)
MCV: 92.5 fL (ref 80.0–100.0)
Platelets: 249 10*3/uL (ref 150–400)
RBC: 4.24 MIL/uL (ref 3.87–5.11)
RDW: 12.4 % (ref 11.5–15.5)
WBC: 11.4 10*3/uL — ABNORMAL HIGH (ref 4.0–10.5)
nRBC: 0 % (ref 0.0–0.2)

## 2020-04-10 LAB — BASIC METABOLIC PANEL
Anion gap: 12 (ref 5–15)
BUN: 15 mg/dL (ref 6–20)
CO2: 23 mmol/L (ref 22–32)
Calcium: 9.3 mg/dL (ref 8.9–10.3)
Chloride: 103 mmol/L (ref 98–111)
Creatinine, Ser: 1.11 mg/dL — ABNORMAL HIGH (ref 0.44–1.00)
GFR, Estimated: 59 mL/min — ABNORMAL LOW (ref >60)
Glucose, Bld: 104 mg/dL — ABNORMAL HIGH (ref 70–99)
Potassium: 3.6 mmol/L (ref 3.5–5.1)
Sodium: 138 mmol/L (ref 135–145)

## 2020-04-10 IMAGING — CT CT RENAL STONE PROTOCOL
2 of 4 series · 16 of 46 positions shown, 18 images · non-contrast
Comparison: CT renal [DATE]

CLINICAL DATA: Bilateral flank pain.  Kidney stone suspected.

EXAM:
CT ABDOMEN AND PELVIS WITHOUT CONTRAST
TECHNIQUE: Multidetector CT imaging of the abdomen and pelvis was performed
following the standard protocol without IV contrast.

[Series 3: abd/ pelvis 5.0 i30f 2 · axial · 0.91mm/px · z∈[+774,+1224]mm · 13 of 100 slices shown, 15 images]
[im 5/100  soft-tissue]
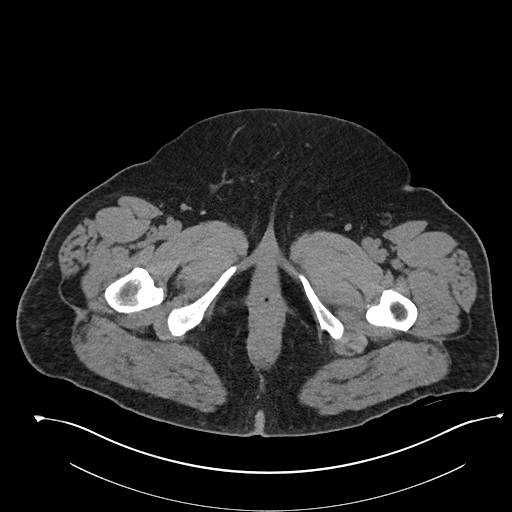
[im 5/100  bone]
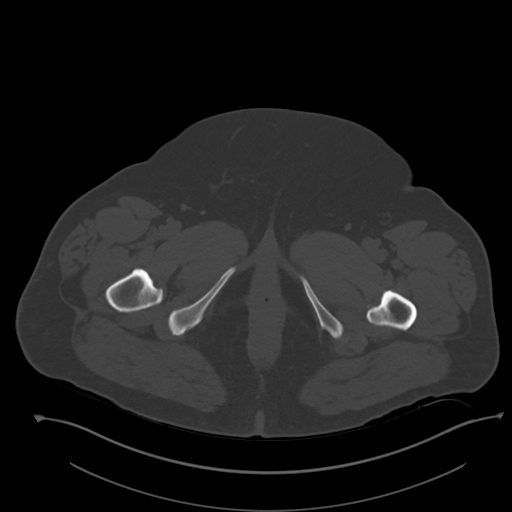
[im 13/100  soft-tissue]
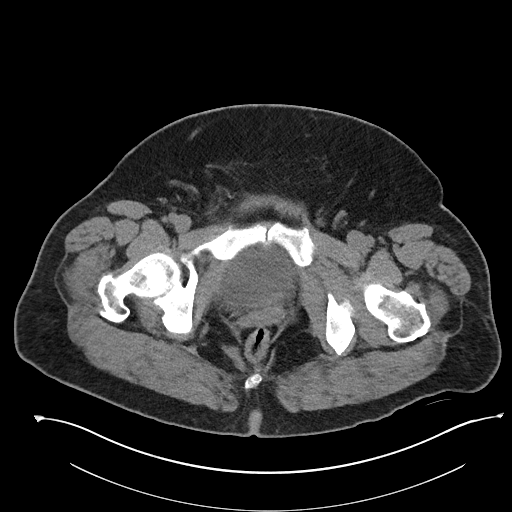
[im 22/100  soft-tissue]
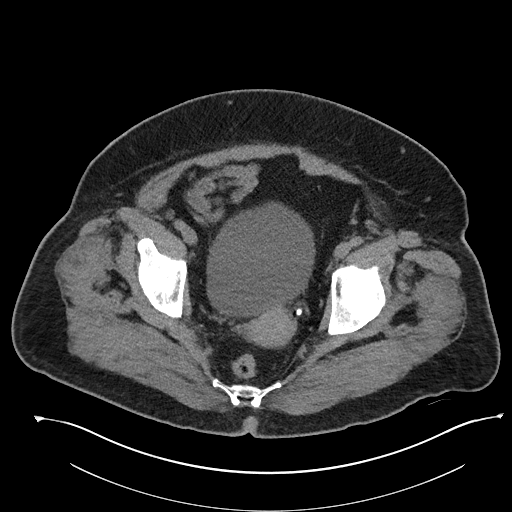
[im 26/100  soft-tissue]
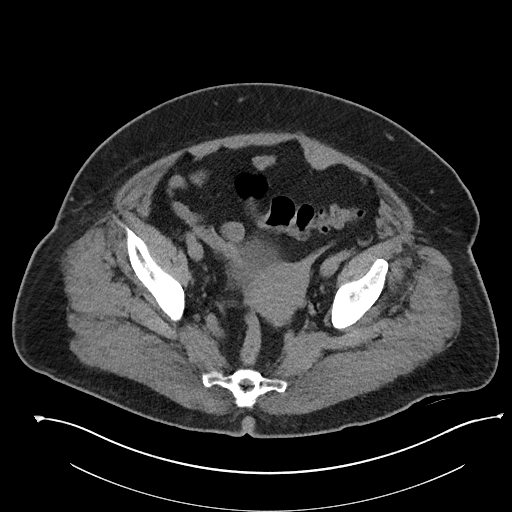
[im 35/100  soft-tissue]
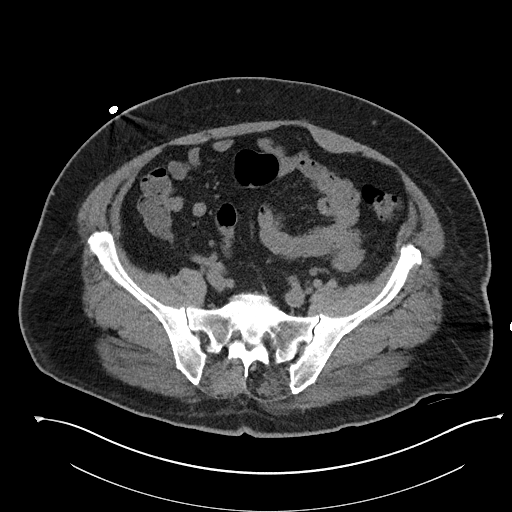
[im 44/100  soft-tissue]
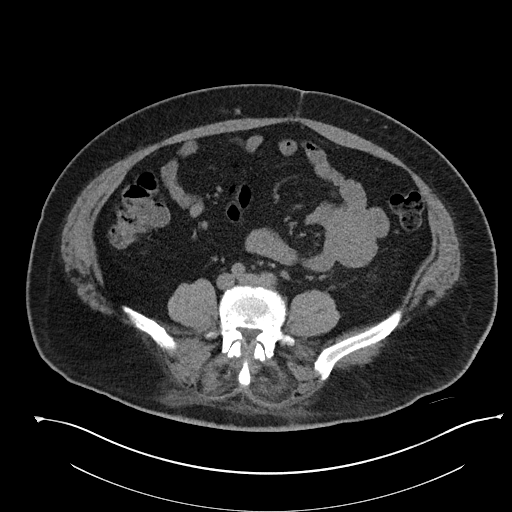
[im 52/100  soft-tissue]
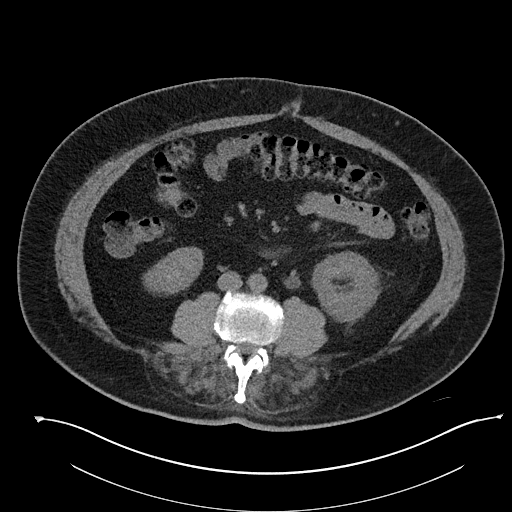
[im 56/100  soft-tissue]
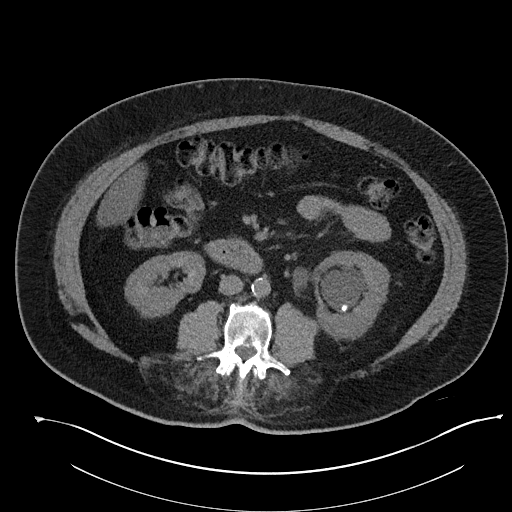
[im 65/100  soft-tissue]
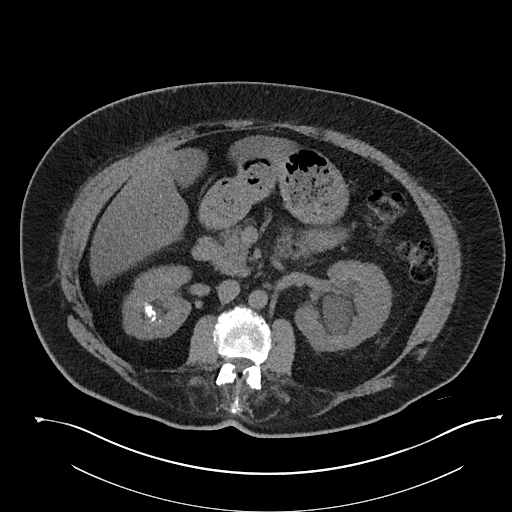
[im 65/100  bone]
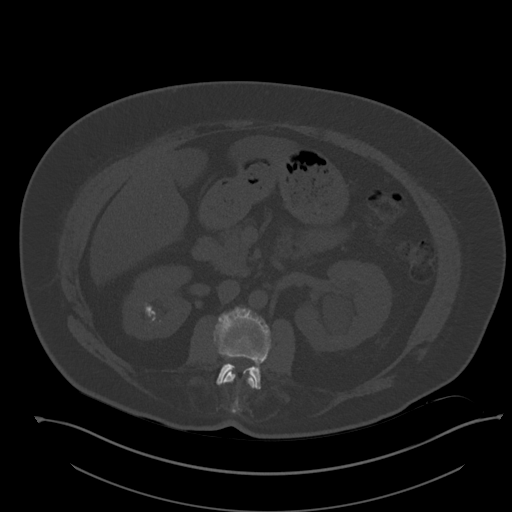
[im 74/100  soft-tissue]
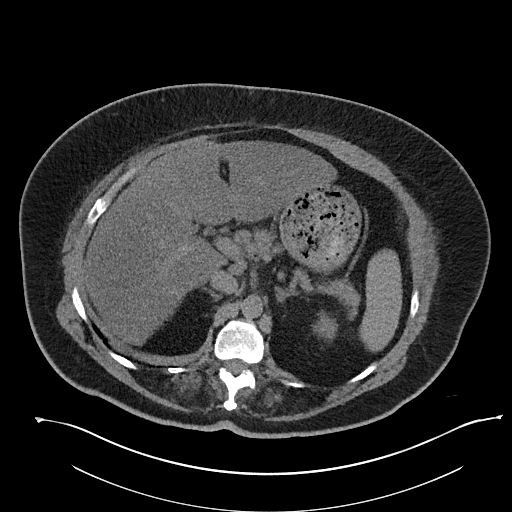
[im 78/100  soft-tissue]
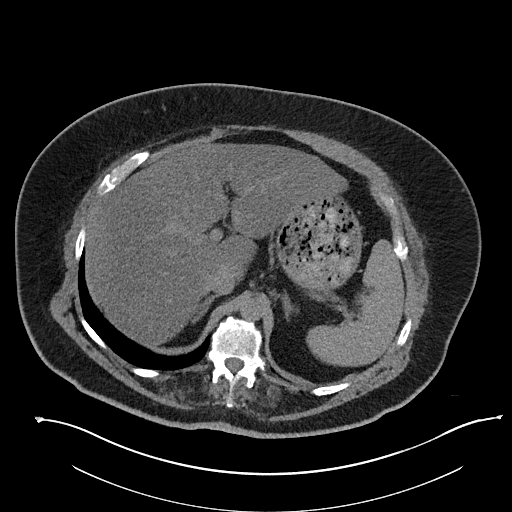
[im 87/100  soft-tissue]
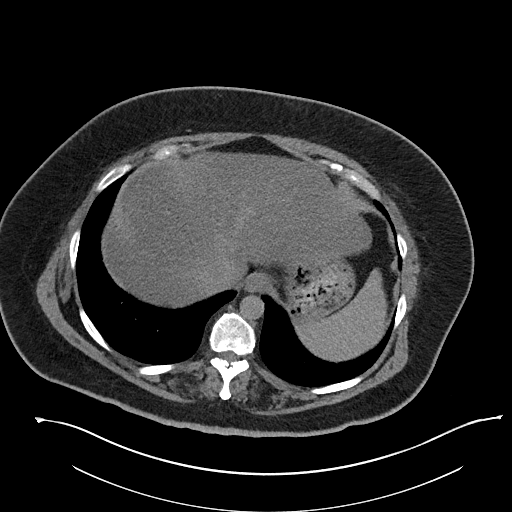
[im 95/100  soft-tissue]
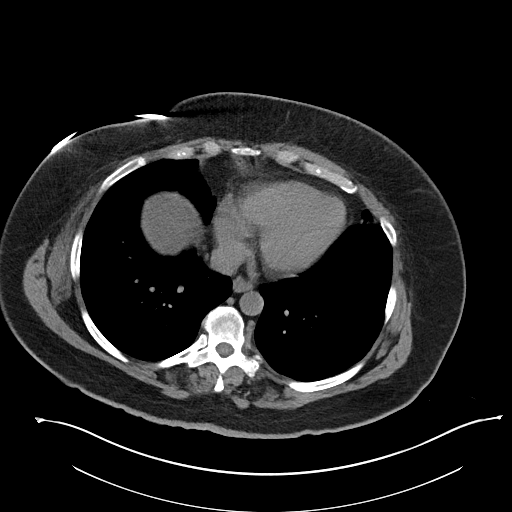

[Series 6: cor st · coronal · 0.88mm/px · 3 of 111 slices shown]
[im 37/111  soft-tissue]
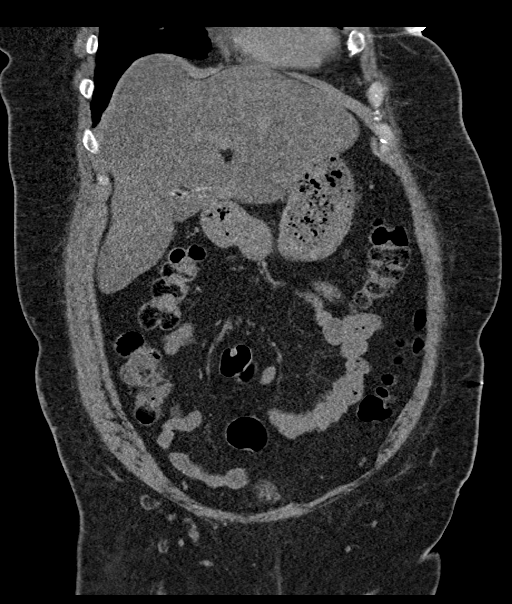
[im 49/111  soft-tissue]
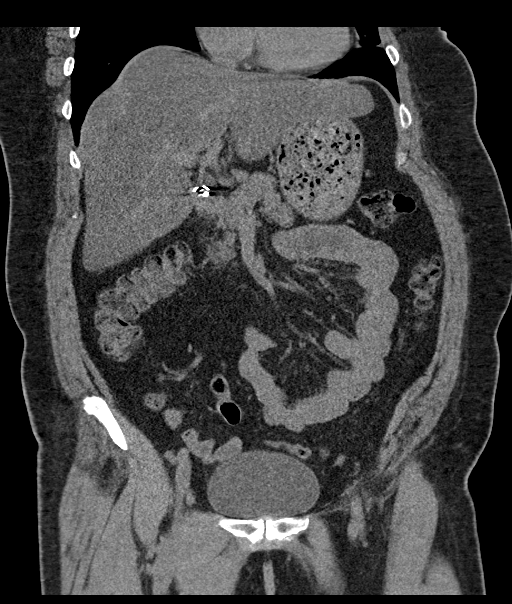
[im 62/111  soft-tissue]
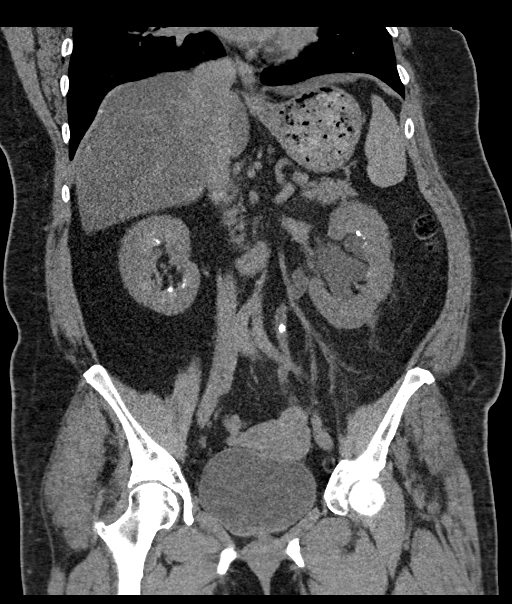

[16 of 46 positions shown; findings below may reference images not displayed]

FINDINGS: Lower chest: No acute abnormality.

Hepatobiliary: The hepatic parenchyma is diffusely hypodense
compared to the splenic parenchyma consistent with fatty
infiltration. No focal liver abnormality. Status post
cholecystectomy. No biliary dilatation.

Pancreas: No focal lesion. Normal pancreatic contour. No surrounding
inflammatory changes. No main pancreatic ductal dilatation.

Spleen: Normal in size without focal abnormality. A splenule is
identified.

Adrenals/Urinary Tract:

No adrenal nodule bilaterally.

No contour-deforming renal mass. Several calcified stones within the
kidneys measuring up to 1.2 cm on the right and up to 0.7 cm on the
left. Moderate left hydronephrosis. No right hydronephrosis.

There is a 5 mm calcified stone within the proximal left ureter just
distal to the left ureteropelvic junction. Associated proximal
moderate left hydroureter. Distally the left ureter demonstrates
mild hydroureter with a 4 mm calcified stone in the distal ureter
just proximal to the left ureteropelvic junction. No right
ureterolithiasis or hydroureter.

The urinary bladder is unremarkable.

Stomach/Bowel: Stomach is within normal limits. No evidence of bowel
wall thickening or dilatation. Few scattered sigmoid diverticula.
Appendix appears normal.

Vascular/Lymphatic: No abdominal aorta or iliac aneurysm. Mild
atherosclerotic plaque of the aorta and its branches. No abdominal,
pelvic, or inguinal lymphadenopathy.

Reproductive: Uterus and bilateral adnexa are unremarkable.

Other: No intraperitoneal free fluid. No intraperitoneal free gas.
No organized fluid collection.

Musculoskeletal:

No abdominal wall hernia or abnormality

No suspicious lytic or blastic osseous lesions. No acute displaced
fracture. Multilevel degenerative changes of the spine.
IMPRESSION: 1. Obstructive proximal left 5 mm and distal left 4 mm
ureterolithiasis.
2. Nonobstructive bilateral nephrolithiasis measuring up to 1.2 cm
on the right and 0.7 cm on the left.
3. Few scattered sigmoid diverticula with no acute diverticulitis.
4. Hepatic steatosis.

## 2020-04-10 MED ORDER — MORPHINE SULFATE (PF) 4 MG/ML IV SOLN
4.0000 mg | Freq: Once | INTRAVENOUS | Status: AC
  Administered 2020-04-10: 4 mg via INTRAVENOUS
  Filled 2020-04-10: qty 1

## 2020-04-10 MED ORDER — OXYCODONE-ACETAMINOPHEN 5-325 MG PO TABS
1.0000 | ORAL_TABLET | Freq: Three times a day (TID) | ORAL | 0 refills | Status: AC | PRN
Start: 2020-04-10 — End: 2020-04-13

## 2020-04-10 MED ORDER — HYDROMORPHONE HCL 1 MG/ML IJ SOLN
1.0000 mg | Freq: Once | INTRAMUSCULAR | Status: DC
  Filled 2020-04-10 (×2): qty 1

## 2020-04-10 MED ORDER — TAMSULOSIN HCL 0.4 MG PO CAPS
0.4000 mg | ORAL_CAPSULE | Freq: Every day | ORAL | 0 refills | Status: DC
Start: 2020-04-10 — End: 2020-04-18

## 2020-04-10 MED ORDER — ONDANSETRON HCL 4 MG/2ML IJ SOLN
4.0000 mg | Freq: Once | INTRAMUSCULAR | Status: AC
  Administered 2020-04-10: 4 mg via INTRAVENOUS
  Filled 2020-04-10: qty 2

## 2020-04-10 MED ORDER — SODIUM CHLORIDE 0.9 % IV BOLUS
1000.0000 mL | Freq: Once | INTRAVENOUS | Status: AC
  Administered 2020-04-10: 1000 mL via INTRAVENOUS

## 2020-04-10 MED ORDER — KETOROLAC TROMETHAMINE 15 MG/ML IJ SOLN
15.0000 mg | Freq: Once | INTRAMUSCULAR | Status: AC
  Administered 2020-04-10: 15 mg via INTRAVENOUS
  Filled 2020-04-10: qty 1

## 2020-04-10 NOTE — ED Triage Notes (Signed)
Pt c/o bilateral flank pain, worse on the left, along with blood in her urine. Hx kidney stones.

## 2020-04-10 NOTE — Discharge Instructions (Addendum)
Please follow-up with the urologist.  Call their office tomorrow.  If you develop fever, uncontrolled pain, vomiting, please return immediately to ER for reassessment.  Take tylenol and motrin for pain control. For breakthrough pain, can take the prescribed narcotics but note this can make you drowsy and should not be taken while driving or operating heavy machinery.

## 2020-04-10 NOTE — Telephone Encounter (Signed)
[name] - Client TELEPHONE ADVICE RECORD [facility] Patient Name: Michele Hall, Michele Hall Gender: Female DOB: [birth year] Age: 54 Y 2 M 20 D Return Phone Number: [phone] (Primary) [place] [address] Client [name] - Client Client Site [place] - Day Physician Eliezer Lofts - MD Contact Type Call Who Is Calling Patient / Member / Family / Caregiver Call Type Triage / Clinical Relationship To Patient Self Return Phone Number [phone] (Primary) Chief Complaint Abdominal Pain Reason for Call Symptomatic / Request for [name] states she is having abdominal pain. She thinks she may have a kidney stone. Translation No Nurse Assessment Nurse: [name], RN, [name] Date/Time (Eastern Time): 04/10/2020 3:05:01 PM Confirm and document reason for call. If symptomatic, describe symptoms. ---Caller states she has abdominal pain, thinks she has a kidney stone. She had one before she was with . Stone was stuck in tube and tissue grew around Michele stone, urologist had to cut stone out. Has pain in back and left side. States she has tried to pass it since 10/24. Does Michele patient have any new or worsening symptoms? ---Yes Will a triage be completed? ---Yes Related visit to physician within Michele last 2 weeks? ---No Does Michele PT have any chronic conditions? (i.e. diabetes, asthma, this includes High risk factors for pregnancy, etc.) ---Yes List chronic conditions. ---HTN Is Michele patient pregnant or possibly pregnant? (Ask all females between Michele ages of 2-55) ---No Is this a behavioral health or substance abuse call? ---No Guidelines Guideline Title Affirmed Question Affirmed Notes Nurse Date/Time (Eastern Time) Flank Pain [1] SEVERE pain (e.g., excruciating, scale 8-10) AND [2] present > 1 hour [name], RN, [name] 97/11/7339 9:37:90 PM Disp. Time  Eilene Ghazi Time) Disposition Final User 04/10/2020 3:15:28 PM Go to ED Now Yes [name], RN, [name] PLEASE NOTE: All timestamps contained within this report are represented as [place] Standard Time. CONFIDENTIALTY NOTICE: This fax transmission is intended only for Michele addressee. It contains information that is legally privileged, confidential or otherwise protected from use or disclosure. If you are not Michele intended recipient, you are strictly prohibited from reviewing, disclosing, copying using or disseminating any of this information or taking any action in reliance on or regarding this information. If you have received this fax in error, please notify us immediately by telephone so that we can arrange for its return to [place]. Phone: [phone], Toll-Free: [phone], Fax: [phone] Page: 2 of 2 Call Id: [id number] [place] Disagree/Comply Disagree Caller Understands Yes PreDisposition Home Care Care Advice Given Per Guideline GO TO ED NOW: CARE ADVICE given per Flank Pain (Adult) guideline. Comments User: Cordie Grice, RN Date/Time (Eastern Time): 04/10/2020 3:15:24 PM Caller states seh wants MD to call in medicine or see if she can come to office, states she does not want to go to ER User: Cordie Grice, RN Date/Time (Eastern Time): 04/10/2020 3:23:34 PM Spoke with Elie Goody at office. States she will contact caller. Referrals GO TO FACILITY REFUSED

## 2020-04-10 NOTE — ED Provider Notes (Signed)
[facility] EMERGENCY DEPARTMENT Provider Note   CSN: [id number] Arrival date & time: 04/10/20  1747     History Chief Complaint  Patient presents with  . Flank Pain    Michele Hall is a 54 y.o. female.  Presented to ER with concern for flank pain.  Pain has been intermittent for the past week or so, worse on left.  Worsening last couple days.  Sharp, stabbing, intermittent, no alleviating factors.  Also has noted some blood in her urine.  No dysuria, no fevers or chills.  Some nausea no vomiting.  Prior episodes kidney stones.  HPI     Past Medical History:  Diagnosis Date  . Anal fissure - posterior 2015  . Anemia   . Gallstones   . GERD (gastroesophageal reflux disease)   . Hypertension   . IBS   . Kidney stones   . Morbid obesity [facility])     Patient Active Problem List   Diagnosis Date Noted  . ETD (Eustachian tube dysfunction), bilateral 04/04/2020  . Sacro-iliac pain 09/01/2019  . Chronic dermatitis 08/15/2019  . Pruritus 04/27/2019  . 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 Routine general medical examination at a health care facility 01/22/2019  . Allergic contact dermatitis 11/15/2018  . Posterior epistaxis 08/11/2018  . Cervical cancer screening 12/02/2017  . Allergic rhinitis due to allergen 04/04/2014  . Anal fissure - posterior 09/06/2013  . History of nephrolithiasis 10/27/2010  . Vitamin B 12 deficiency 07/18/2010  . RESTLESS LEG SYNDROME 05/07/2010  . Hyperlipidemia 10/27/2006  . OBESITY 10/27/2006  . Essential hypertension, benign 10/27/2006  . GERD 10/27/2006  . IBS 10/27/2006    Past Surgical History:  Procedure Laterality Date  . CHOLECYSTECTOMY    . DILITATION & CURRETTAGE/HYSTROSCOPY WITH HYDROTHERMAL ABLATION N/A 09/13/2012   Procedure: DILATATION & CURETTAGE/HYSTEROSCOPY WITH HYDROTHERMAL ABLATION;  Surgeon: Osborne Oman, MD;  Location: [facility];  Service: Gynecology;  Laterality: N/A;  . TUBAL LIGATION       OB  History    Gravida  2   Para  2   Term  2   Preterm      AB      Living  2     SAB      TAB      Ectopic      Multiple      Live Births              Family History  Problem Relation Age of Onset  . Stroke Mother   . Kidney disease Father   . Coronary artery disease Father   . Aneurysm Father        AAA  . Hypertension Father   . Seizures Sister   . Heart attack Maternal Grandfather   . Cancer Sister        ovarian/uterus  . Tongue cancer Sister   . Colon cancer Neg Hx   . Stomach cancer Neg Hx   . Urticaria Neg Hx   . Immunodeficiency Neg Hx   . Eczema Neg Hx   . Atopy Neg Hx   . Asthma Neg Hx   . Angioedema Neg Hx   . Allergic rhinitis Neg Hx     Social History   Tobacco Use  . Smoking status: Former Smoker    Quit date: 02/09/2002    Years since quitting: 18.1  . Smokeless tobacco: Never Used  Vaping Use  . Vaping Use: Never used  Substance Use Topics  .  Alcohol use: No  . Drug use: No    Home Medications Prior to Admission medications   Medication Sig Start Date End Date Taking? Authorizing Provider  cyanocobalamin 1000 MCG tablet Take 1,000 mcg by mouth daily.    [provider]  fexofenadine (ALLEGRA) 180 MG tablet TAKE 1 TABLET BY MOUTH EVERY DAY 01/16/20   Bedsole, Amy E, MD  hydrOXYzine (VISTARIL) 25 MG capsule Take 25-50 mg by mouth at bedtime. 07/29/19   [provider]  oxyCODONE-acetaminophen (PERCOCET/ROXICET) 5-325 MG tablet Take 1 tablet by mouth every 8 (eight) hours as needed for up to 3 days for severe pain. 04/10/20 04/13/20  Lucrezia Starch, MD  spironolactone (ALDACTONE) 50 MG tablet TAKE 1 TABLET BY MOUTH EVERY DAY 03/06/20   Bedsole, Amy E, MD  tamsulosin (FLOMAX) 0.4 MG CAPS capsule Take 1 capsule (0.4 mg total) by mouth daily. 04/10/20   Lucrezia Starch, MD    Allergies    Requip [ropinirole hcl]  Review of Systems   Review of Systems  Constitutional: Negative for chills and fever.  HENT:  Negative for ear pain and sore throat.   Eyes: Negative for pain and visual disturbance.  Respiratory: Negative for cough and shortness of breath.   Cardiovascular: Negative for chest pain and palpitations.  Gastrointestinal: Positive for abdominal pain. Negative for vomiting.  Genitourinary: Positive for flank pain and hematuria. Negative for dysuria.  Musculoskeletal: Negative for arthralgias and back pain.  Skin: Negative for color change and rash.  Neurological: Negative for seizures and syncope.  All other systems reviewed and are negative.   Physical Exam Updated Vital Signs BP (!) 109/55 (BP Location: Right Arm)   Pulse 89   Temp 98.6 F (37 C) (Oral)   Resp 16   SpO2 99%   Physical Exam Vitals and nursing note reviewed.  Constitutional:      General: She is not in acute distress.    Appearance: She is well-developed.  HENT:     Head: Normocephalic and atraumatic.  Eyes:     Conjunctiva/sclera: Conjunctivae normal.  Cardiovascular:     Rate and Rhythm: Normal rate and regular rhythm.     Heart sounds: No murmur heard.   Pulmonary:     Effort: Pulmonary effort is normal. No respiratory distress.     Breath sounds: Normal breath sounds.  Abdominal:     Palpations: Abdomen is soft.     Tenderness: There is no abdominal tenderness.     Comments: Some TTP over left flank  Musculoskeletal:        General: No deformity or signs of injury.     Cervical back: Neck supple.  Skin:    General: Skin is warm and dry.  Neurological:     General: No focal deficit present.     Mental Status: She is alert and oriented to person, place, and time.     ED Results / Procedures / Treatments   Labs (all labs ordered are listed, but only abnormal results are displayed) Labs Reviewed  URINALYSIS, ROUTINE W REFLEX MICROSCOPIC - Abnormal; Notable for the following components:      Result Value   APPearance HAZY (*)    Hgb urine dipstick LARGE (*)    Ketones, ur 5 (*)     Protein, ur 100 (*)    Leukocytes,Ua LARGE (*)    RBC / HPF >50 (*)    WBC, UA >50 (*)    Bacteria, UA RARE (*)  All other components within normal limits  BASIC METABOLIC PANEL - Abnormal; Notable for the following components:   Glucose, Bld 104 (*)    Creatinine, Ser 1.11 (*)    GFR, Estimated 59 (*)    All other components within normal limits  CBC - Abnormal; Notable for the following components:   WBC 11.4 (*)    All other components within normal limits  URINE CULTURE    EKG None  Radiology CT Renal Stone Study  Result Date: 04/10/2020 CLINICAL DATA:  Bilateral flank pain.  Kidney stone suspected. EXAM: CT ABDOMEN AND PELVIS WITHOUT CONTRAST TECHNIQUE: Multidetector CT imaging of the abdomen and pelvis was performed following the standard protocol without IV contrast. COMPARISON:  CT renal 08/09/2014 FINDINGS: Lower chest: No acute abnormality. Hepatobiliary: The hepatic parenchyma is diffusely hypodense compared to the splenic parenchyma consistent with fatty infiltration. No focal liver abnormality. Status post cholecystectomy. No biliary dilatation. Pancreas: No focal lesion. Normal pancreatic contour. No surrounding inflammatory changes. No main pancreatic ductal dilatation. Spleen: Normal in size without focal abnormality. A splenule is identified. Adrenals/Urinary Tract: No adrenal nodule bilaterally. No contour-deforming renal mass. Several calcified stones within the kidneys measuring up to 1.2 cm on the right and up to 0.7 cm on the left. Moderate left hydronephrosis. No right hydronephrosis. There is a 5 mm calcified stone within the proximal left ureter just distal to the left ureteropelvic junction. Associated proximal moderate left hydroureter. Distally the left ureter demonstrates mild hydroureter with a 4 mm calcified stone in the distal ureter just proximal to the left ureteropelvic junction. No right ureterolithiasis or hydroureter. The urinary bladder is unremarkable.  Stomach/Bowel: Stomach is within normal limits. No evidence of bowel wall thickening or dilatation. Few scattered sigmoid diverticula. Appendix appears normal. Vascular/Lymphatic: No abdominal aorta or iliac aneurysm. Mild atherosclerotic plaque of the aorta and its branches. No abdominal, pelvic, or inguinal lymphadenopathy. Reproductive: Uterus and bilateral adnexa are unremarkable. Other: No intraperitoneal free fluid. No intraperitoneal free gas. No organized fluid collection. Musculoskeletal: No abdominal wall hernia or abnormality No suspicious lytic or blastic osseous lesions. No acute displaced fracture. Multilevel degenerative changes of the spine. IMPRESSION: 1. Obstructive proximal left 5 mm and distal left 4 mm ureterolithiasis. 2. Nonobstructive bilateral nephrolithiasis measuring up to 1.2 cm on the right and 0.7 cm on the left. 3. Few scattered sigmoid diverticula with no acute diverticulitis. 4. Hepatic steatosis. Electronically Signed   By: Iven Finn M.D.   On: 04/10/2020 21:36    Procedures Procedures (including critical care time)  Medications Ordered in ED Medications  HYDROmorphone (DILAUDID) injection 1 mg (1 mg Intravenous Refused 04/10/20 2330)  ketorolac (TORADOL) 15 MG/ML injection 15 mg (15 mg Intravenous Given 04/10/20 2151)  morphine 4 MG/ML injection 4 mg (4 mg Intravenous Given 04/10/20 2151)  sodium chloride 0.9 % bolus 1,000 mL (0 mLs Intravenous Stopped 04/10/20 2331)  ondansetron (ZOFRAN) injection 4 mg (4 mg Intravenous Given 04/10/20 2238)    ED Course  I have reviewed the triage vital signs and the nursing notes.  Pertinent labs & imaging results that were available during my care of the patient were reviewed by me and considered in my medical decision making (see chart for details).  Clinical Course as of Apr 11 2339  Wed Apr 10, 2020  2142 Will review with urology on call given UA findings  CT Renal Laren Everts [RD]    Clinical Course User Index [RD]  Lucrezia Starch, MD   MDM  Rules/Calculators/A&P                         55 year old lady presented to ER with concern for left flank pain.  CT demonstrating 5 mm obstructing proximal ureteral stone.  Suspect this is culprit.  Labs grossly stable.  UA with blood, WBCs, leukocytes but negative nitrates and only rare bacteria.  Reviewed with on-call for urology, Dr. Alinda Money, likely contaminated specimen.  Will send urine culture.  Patient is nontoxic-appearing, afebrile.  Symptoms well controlled at present.  I believe she is appropriate for discharge and outpatient management.  Instructed to follow-up with urology.  Provided Rx for Percocet, Flomax.  Reviewed return precautions should she develop uncontrolled pain, vomiting, any fever.  After the discussed management above, the patient was determined to be safe for discharge.  The patient was in agreement with this plan and all questions regarding their care were answered.  ED return precautions were discussed and the patient will return to the ED with any significant worsening of condition.    Final Clinical Impression(s) / ED Diagnoses Final diagnoses:  Flank pain  Kidney stone    Rx / DC Orders ED Discharge Orders         Ordered    tamsulosin (FLOMAX) 0.4 MG CAPS capsule  Daily        04/10/20 2311    oxyCODONE-acetaminophen (PERCOCET/ROXICET) 5-325 MG tablet  Every 8 hours PRN        04/10/20 2314           Lucrezia Starch, MD 04/10/20 2340

## 2020-04-10 NOTE — Telephone Encounter (Signed)
Ebony Hail RN with Access nurse called office pt was not on phone; pt has severe lt flank pain with hx of kidney stones. Pt refused ED disposition and wanted to be seen at [facility]. No available appts at [facility]. Pt wants to know what the wait time might be at [facility] ED; I spoke with Collie Siad in triage at [facility] ED and she said that their triage will make a difference in wait time. Pt said starting 03/31/20 pt began to have back pain but pt had been on vacation and sleeping on a different bed and thought that might have been cause of back pain. Pt said the lt flank pain has been worsening and for the last couple of days the pain has become severe. Now pain level is 8 and pain is constant. Pt has hx of kidney stones. Pt said today she has started having pain at top of kidney on rt side of back also. Pt has a sense of urgency. Pt has had chills on and off but does not think pt has fever. Pt does not have burning or pain upon urination,no frequency and pt has not seen blood in urine; no N&V yet. Urine is darker than usual and pt is trying to drink more water but pt is not as successful about drinking as she would like. Pt is fearful that the stone is stuck in the ureter due to the pain being constant and pt cannot tell that the kidney stone is moving. Pt said she might go to UC but I advised that would be a way for pt to have exam and possible testing but if pt needs imaging pt would be sent to ED. Pt is going to [facility] ED now by car. FYI to Dr Diona Browner. Access nurse note is not in portal yet and will add when available.

## 2020-04-10 NOTE — Telephone Encounter (Signed)
I spoke with pt and she is leaving for [facility] ED now. Pt said after we talked earlier pt had to urinate and the commode water was dark brown and bright red. Pt said she is definitely going to ED now. FYI to Dr Diona Browner and Butch Penny CMA.

## 2020-04-11 LAB — URINE CULTURE: Culture: NO GROWTH

## 2020-04-11 NOTE — Telephone Encounter (Signed)
Noted  

## 2020-04-12 ENCOUNTER — Other Ambulatory Visit: Payer: Self-pay | Admitting: Urology

## 2020-04-12 NOTE — Progress Notes (Signed)
Pt. Needs orders for upcomming surgery.PST and labs appointment on 04/16/20.Thanks.

## 2020-04-12 NOTE — Patient Instructions (Signed)
DUE TO COVID-19 ONLY ONE VISITOR IS ALLOWED TO COME WITH YOU AND STAY IN THE WAITING ROOM ONLY DURING PRE OP AND PROCEDURE DAY OF SURGERY. THE 1 VISITOR  MAY VISIT WITH YOU AFTER SURGERY IN YOUR PRIVATE ROOM DURING VISITING HOURS ONLY!  YOU NEED TO HAVE A COVID 19 TEST ON: 04/16/20 @ 10:55 AM, THIS TEST MUST BE DONE BEFORE SURGERY,  COVID TESTING SITE [place] [address], IT IS ON THE RIGHT GOING OUT [address] APPROXIMATELY  2 MINUTES PAST [employer] ON THE RIGHT. ONCE YOUR COVID TEST IS COMPLETED,  PLEASE BEGIN THE QUARANTINE INSTRUCTIONS AS OUTLINED IN YOUR HANDOUT.                Michele Hall    Your procedure is scheduled on: 04/18/20   Report to [facility] Main  Entrance   Report to admitting at: 8:30 AM     Call this number if you have problems the morning of surgery [phone]    Remember: Do not eat food or drink liquids :After Midnight.   BRUSH YOUR TEETH MORNING OF SURGERY AND RINSE YOUR MOUTH OUT, NO CHEWING GUM CANDY OR MINTS.     Take these medicines the morning of surgery with A SIP OF WATER: allegra,tamsulosin.                               You may not have any metal on your body including hair pins and              piercings  Do not wear jewelry, make-up, lotions, powders or perfumes, deodorant             Do not wear nail polish on your fingernails.  Do not shave  48 hours prior to surgery.              Do not bring valuables to the hospital. [place].  Contacts, dentures or bridgework may not be worn into surgery.  Leave suitcase in the car. After surgery it may be brought to your room.     Patients discharged the day of surgery will not be allowed to drive home. IF YOU ARE HAVING SURGERY AND GOING HOME THE SAME DAY, YOU MUST HAVE AN ADULT TO DRIVE YOU HOME AND BE WITH YOU FOR 24 HOURS. YOU MAY GO HOME BY TAXI OR UBER OR ORTHERWISE, BUT AN ADULT MUST ACCOMPANY YOU  HOME AND STAY WITH YOU FOR 24 HOURS.  Name and phone number of your driver:  Special Instructions: N/A              Please read over the following fact sheets you were given: ____________________________________________________________________          [facility] - Preparing for Surgery Before surgery, you can play an important role.  Because skin is not sterile, your skin needs to be as free of germs as possible.  You can reduce the number of germs on your skin by washing with CHG (chlorahexidine gluconate) soap before surgery.  CHG is an antiseptic cleaner which kills germs and bonds with the skin to continue killing germs even after washing. Please DO NOT use if you have an allergy to CHG or antibacterial soaps.  If your skin becomes reddened/irritated stop using the CHG and  inform your nurse when you arrive at Short Stay. Do not shave (including legs and underarms) for at least 48 hours prior to the first CHG shower.  You may shave your face/neck. Please follow these instructions carefully:  1.  Shower with CHG Soap the night before surgery and the  morning of Surgery.  2.  If you choose to wash your hair, wash your hair first as usual with your  normal  shampoo.  3.  After you shampoo, rinse your hair and body thoroughly to remove the  shampoo.                           4.  Use CHG as you would any other liquid soap.  You can apply chg directly  to the skin and wash                       Gently with a scrungie or clean washcloth.  5.  Apply the CHG Soap to your body ONLY FROM THE NECK DOWN.   Do not use on face/ open                           Wound or open sores. Avoid contact with eyes, ears mouth and genitals (private parts).                       Wash face,  Genitals (private parts) with your normal soap.             6.  Wash thoroughly, paying special attention to the area where your surgery  will be performed.  7.  Thoroughly rinse your body with warm water from the neck down.  8.  DO  NOT shower/wash with your normal soap after using and rinsing off  the CHG Soap.                9.  Pat yourself dry with a clean towel.            10.  Wear clean pajamas.            11.  Place clean sheets on your bed the night of your first shower and do not  sleep with pets. Day of Surgery : Do not apply any lotions/deodorants the morning of surgery.  Please wear clean clothes to the hospital/surgery center.  FAILURE TO FOLLOW THESE INSTRUCTIONS MAY RESULT IN THE CANCELLATION OF YOUR SURGERY PATIENT SIGNATURE_________________________________  NURSE SIGNATURE__________________________________  ________________________________________________________________________

## 2020-04-16 ENCOUNTER — Other Ambulatory Visit (HOSPITAL_COMMUNITY)
Admission: RE | Admit: 2020-04-16 | Discharge: 2020-04-16 | Disposition: A | Discharge status: Home / Self Care | Payer: BC Managed Care – PPO | Source: Ambulatory Visit | Attending: Urology | Admitting: Urology

## 2020-04-16 ENCOUNTER — Other Ambulatory Visit: Payer: Self-pay

## 2020-04-16 ENCOUNTER — Encounter (HOSPITAL_COMMUNITY)
Admission: RE | Admit: 2020-04-16 | Discharge: 2020-04-16 | Disposition: A | Discharge status: Home / Self Care | Payer: BC Managed Care – PPO | Source: Ambulatory Visit | Attending: Urology | Admitting: Urology

## 2020-04-16 ENCOUNTER — Encounter (HOSPITAL_COMMUNITY): Payer: Self-pay

## 2020-04-16 DIAGNOSIS — N202 Calculus of kidney with calculus of ureter: Secondary | ICD-10-CM | POA: Diagnosis present

## 2020-04-16 DIAGNOSIS — N132 Hydronephrosis with renal and ureteral calculous obstruction: Secondary | ICD-10-CM | POA: Diagnosis not present

## 2020-04-16 DIAGNOSIS — Z20822 Contact with and (suspected) exposure to covid-19: Secondary | ICD-10-CM | POA: Diagnosis not present

## 2020-04-16 HISTORY — DX: Personal history of urinary calculi: Z87.442

## 2020-04-16 LAB — SARS CORONAVIRUS 2 (TAT 6-24 HRS): SARS Coronavirus 2: NEGATIVE

## 2020-04-16 NOTE — Progress Notes (Addendum)
COVID Vaccine Completed: NO Date COVID Vaccine completed: COVID vaccine manufacturer: Pfizer    [employer]   PCP - Dr. Eliezer Lofts. Cardiologist - NO  Chest x-ray -  EKG -  Stress Test -  ECHO -  Cardiac Cath -  Pacemaker/ICD device last checked:  Sleep Study -  CPAP -   Fasting Blood Sugar -  Checks Blood Sugar _____ times a day  Blood Thinner Instructions: Aspirin Instructions: Last Dose:  Anesthesia review:   Patient denies shortness of breath, fever, cough and chest pain at PAT appointment   Patient verbalized understanding of instructions that were given to them at the PAT appointment. Patient was also instructed that they will need to review over the PAT instructions again at home before surgery.

## 2020-04-18 ENCOUNTER — Ambulatory Visit (HOSPITAL_COMMUNITY)
Admission: RE | Admit: 2020-04-18 | Discharge: 2020-04-18 | Disposition: A | Discharge status: Home / Self Care | Payer: BC Managed Care – PPO | Attending: Urology | Admitting: Urology

## 2020-04-18 ENCOUNTER — Ambulatory Visit (HOSPITAL_COMMUNITY): Payer: BC Managed Care – PPO

## 2020-04-18 ENCOUNTER — Ambulatory Visit (HOSPITAL_COMMUNITY): Payer: BC Managed Care – PPO | Admitting: Certified Registered Nurse Anesthetist

## 2020-04-18 ENCOUNTER — Encounter (HOSPITAL_COMMUNITY)
Admission: RE | Disposition: A | Discharge status: Home / Self Care | Payer: Self-pay | Source: Home / Self Care | Attending: Urology

## 2020-04-18 ENCOUNTER — Encounter (HOSPITAL_COMMUNITY): Payer: Self-pay | Admitting: Urology

## 2020-04-18 DIAGNOSIS — Z20822 Contact with and (suspected) exposure to covid-19: Secondary | ICD-10-CM | POA: Insufficient documentation

## 2020-04-18 DIAGNOSIS — N132 Hydronephrosis with renal and ureteral calculous obstruction: Secondary | ICD-10-CM | POA: Insufficient documentation

## 2020-04-18 HISTORY — PX: HOLMIUM LASER APPLICATION: SHX5852

## 2020-04-18 HISTORY — PX: CYSTOSCOPY WITH RETROGRADE PYELOGRAM, URETEROSCOPY AND STENT PLACEMENT: SHX5789

## 2020-04-18 SURGERY — CYSTOURETEROSCOPY, WITH RETROGRADE PYELOGRAM AND STENT INSERTION
Anesthesia: General | Laterality: Left

## 2020-04-18 MED ORDER — PROPOFOL 10 MG/ML IV BOLUS
INTRAVENOUS | Status: DC | PRN
  Administered 2020-04-18: 170 mg via INTRAVENOUS

## 2020-04-18 MED ORDER — CEPHALEXIN 500 MG PO CAPS: 500.0000 mg | ORAL_CAPSULE | Freq: Two times a day (BID) | ORAL | 0 refills | Status: DC

## 2020-04-18 MED ORDER — PHENYLEPHRINE HCL-NACL 10-0.9 MG/250ML-% IV SOLN
INTRAVENOUS | Status: DC | PRN
  Administered 2020-04-18: 50 ug/min via INTRAVENOUS

## 2020-04-18 MED ORDER — LACTATED RINGERS IV SOLN: INTRAVENOUS | Status: DC

## 2020-04-18 MED ORDER — IOHEXOL 300 MG/ML  SOLN
INTRAMUSCULAR | Status: DC | PRN
  Administered 2020-04-18: 18 mL

## 2020-04-18 MED ORDER — CEFAZOLIN SODIUM-DEXTROSE 2-4 GM/100ML-% IV SOLN
2.0000 g | INTRAVENOUS | Status: AC
  Administered 2020-04-18: 2 g via INTRAVENOUS
  Filled 2020-04-18: qty 100

## 2020-04-18 MED ORDER — ACETAMINOPHEN 10 MG/ML IV SOLN
INTRAVENOUS | Status: DC | PRN
  Administered 2020-04-18: 1000 mg via INTRAVENOUS

## 2020-04-18 MED ORDER — MIDAZOLAM HCL 2 MG/2ML IJ SOLN
INTRAMUSCULAR | Status: AC
  Filled 2020-04-18: qty 2

## 2020-04-18 MED ORDER — PHENYLEPHRINE HCL (PRESSORS) 10 MG/ML IV SOLN
INTRAVENOUS | Status: AC
  Filled 2020-04-18: qty 1

## 2020-04-18 MED ORDER — OXYCODONE HCL 5 MG PO TABS
5.0000 mg | ORAL_TABLET | Freq: Once | ORAL | Status: AC
  Administered 2020-04-18: 5 mg via ORAL

## 2020-04-18 MED ORDER — PROPOFOL 10 MG/ML IV BOLUS
INTRAVENOUS | Status: AC
  Filled 2020-04-18: qty 20

## 2020-04-18 MED ORDER — LIDOCAINE HCL (CARDIAC) PF 100 MG/5ML IV SOSY
PREFILLED_SYRINGE | INTRAVENOUS | Status: DC | PRN
  Administered 2020-04-18: 100 mg via INTRAVENOUS

## 2020-04-18 MED ORDER — FENTANYL CITRATE (PF) 100 MCG/2ML IJ SOLN
INTRAMUSCULAR | Status: DC | PRN
  Administered 2020-04-18: 25 ug via INTRAVENOUS
  Administered 2020-04-18 (×3): 50 ug via INTRAVENOUS

## 2020-04-18 MED ORDER — CHLORHEXIDINE GLUCONATE 0.12 % MT SOLN
15.0000 mL | Freq: Once | OROMUCOSAL | Status: AC
  Administered 2020-04-18: 15 mL via OROMUCOSAL

## 2020-04-18 MED ORDER — MIDAZOLAM HCL 5 MG/5ML IJ SOLN
INTRAMUSCULAR | Status: DC | PRN
  Administered 2020-04-18: 2 mg via INTRAVENOUS

## 2020-04-18 MED ORDER — OXYCODONE HCL 5 MG PO TABS
ORAL_TABLET | ORAL | Status: AC
  Filled 2020-04-18: qty 1

## 2020-04-18 MED ORDER — PHENYLEPHRINE 40 MCG/ML (10ML) SYRINGE FOR IV PUSH (FOR BLOOD PRESSURE SUPPORT)
PREFILLED_SYRINGE | INTRAVENOUS | Status: DC | PRN
  Administered 2020-04-18 (×2): 120 ug via INTRAVENOUS
  Administered 2020-04-18: 80 ug via INTRAVENOUS

## 2020-04-18 MED ORDER — ORAL CARE MOUTH RINSE: 15.0000 mL | Freq: Once | OROMUCOSAL | Status: AC

## 2020-04-18 MED ORDER — 0.9 % SODIUM CHLORIDE (POUR BTL) OPTIME
TOPICAL | Status: DC | PRN
  Administered 2020-04-18: 1000 mL

## 2020-04-18 MED ORDER — LIDOCAINE 2% (20 MG/ML) 5 ML SYRINGE
INTRAMUSCULAR | Status: AC
  Filled 2020-04-18: qty 5

## 2020-04-18 MED ORDER — FENTANYL CITRATE (PF) 250 MCG/5ML IJ SOLN
INTRAMUSCULAR | Status: AC
  Filled 2020-04-18: qty 5

## 2020-04-18 MED ORDER — DEXAMETHASONE SODIUM PHOSPHATE 4 MG/ML IJ SOLN
INTRAMUSCULAR | Status: DC | PRN
  Administered 2020-04-18: 5 mg via INTRAVENOUS

## 2020-04-18 MED ORDER — PROMETHAZINE HCL 25 MG/ML IJ SOLN: 6.2500 mg | INTRAMUSCULAR | Status: DC | PRN

## 2020-04-18 MED ORDER — OXYCODONE HCL 5 MG/5ML PO SOLN
ORAL | Status: AC
  Filled 2020-04-18: qty 5

## 2020-04-18 MED ORDER — OXYBUTYNIN CHLORIDE 5 MG PO TABS: 5.0000 mg | ORAL_TABLET | Freq: Three times a day (TID) | ORAL | 1 refills | Status: DC | PRN

## 2020-04-18 MED ORDER — ACETAMINOPHEN 10 MG/ML IV SOLN
INTRAVENOUS | Status: AC
  Filled 2020-04-18: qty 100

## 2020-04-18 MED ORDER — ONDANSETRON HCL 4 MG/2ML IJ SOLN
INTRAMUSCULAR | Status: DC | PRN
  Administered 2020-04-18: 4 mg via INTRAVENOUS

## 2020-04-18 MED ORDER — SODIUM CHLORIDE 0.9 % IR SOLN
Status: DC | PRN
  Administered 2020-04-18: 6000 mL

## 2020-04-18 MED ORDER — FENTANYL CITRATE (PF) 100 MCG/2ML IJ SOLN: 25.0000 ug | INTRAMUSCULAR | Status: DC | PRN

## 2020-04-18 SURGICAL SUPPLY — 26 items
BAG URO CATCHER STRL LF (MISCELLANEOUS) ×3 IMPLANT
CATH INTERMIT  6FR 70CM (CATHETERS) ×3 IMPLANT
CLOTH BEACON ORANGE TIMEOUT ST (SAFETY) ×3 IMPLANT
COVER SURGICAL LIGHT HANDLE (MISCELLANEOUS) ×3 IMPLANT
COVER WAND RF STERILE (DRAPES) IMPLANT
EXTRACTOR STONE NITINOL NGAGE (UROLOGICAL SUPPLIES) ×3 IMPLANT
GLOVE BIOGEL M 8.0 STRL (GLOVE) ×3 IMPLANT
GOWN STRL REUS W/TWL XL LVL3 (GOWN DISPOSABLE) ×3 IMPLANT
GUIDEWIRE ANG ZIPWIRE 038X150 (WIRE) IMPLANT
GUIDEWIRE STR DUAL SENSOR (WIRE) ×6 IMPLANT
IV NS 1000ML (IV SOLUTION) ×3
IV NS 1000ML BAXH (IV SOLUTION) ×1 IMPLANT
KIT TURNOVER KIT A (KITS) IMPLANT
LASER FIB FLEXIVA PULSE ID 365 (Laser) IMPLANT
MANIFOLD NEPTUNE II (INSTRUMENTS) ×3 IMPLANT
PACK CYSTO (CUSTOM PROCEDURE TRAY) ×3 IMPLANT
PENCIL SMOKE EVACUATOR (MISCELLANEOUS) IMPLANT
SHEATH URETERAL 12FRX28CM (UROLOGICAL SUPPLIES) IMPLANT
SHEATH URETERAL 12FRX35CM (MISCELLANEOUS) ×3 IMPLANT
SHEATH URETERAL 12FRX55CM (UROLOGICAL SUPPLIES) IMPLANT
STENT URET 6FRX24 CONTOUR (STENTS) ×3 IMPLANT
TRACTIP FLEXIVA PULS ID 200XHI (Laser) ×1 IMPLANT
TRACTIP FLEXIVA PULSE ID 200 (Laser) ×3
TUBING CONNECTING 10 (TUBING) ×2 IMPLANT
TUBING CONNECTING 10' (TUBING) ×1
TUBING UROLOGY SET (TUBING) ×3 IMPLANT

## 2020-04-18 NOTE — Anesthesia Postprocedure Evaluation (Signed)
Anesthesia Post Note  Patient: Montoya Watkin. [place]  Procedure(s) Performed: CYSTOSCOPY WITH RETROGRADE PYELOGRAM, URETEROSCOPY , STONE EXTRACTION AND STENT PLACEMENT (Left ) HOLMIUM LASER LITHOTRIPSY (Left )     Patient location during evaluation: PACU Anesthesia Type: General Level of consciousness: sedated Pain management: pain level controlled Vital Signs Assessment: post-procedure vital signs reviewed and stable Respiratory status: spontaneous breathing and respiratory function stable Cardiovascular status: stable Postop Assessment: no apparent nausea or vomiting Anesthetic complications: no   No complications documented.  Last Vitals:  Vitals:   04/18/20 1210 04/18/20 1315  BP: 110/72 117/78  Pulse: 76 74  Resp: 16 16  Temp:  36.8 C  SpO2: 95% 96%    Last Pain:  Vitals:   04/18/20 1315  TempSrc:   PainSc: 5                  Taras Rask DANIEL

## 2020-04-18 NOTE — Transfer of Care (Signed)
Immediate Anesthesia Transfer of Care Note  Patient: Michele Hall. [place]  Procedure(s) Performed: CYSTOSCOPY WITH RETROGRADE PYELOGRAM, URETEROSCOPY , STONE EXTRACTION AND STENT PLACEMENT (Left ) HOLMIUM LASER LITHOTRIPSY (Left )  Patient Location: PACU  Anesthesia Type:General  Level of Consciousness: awake, alert  and oriented  Airway & Oxygen Therapy: Patient Spontanous Breathing and Patient connected to face mask  Post-op Assessment: Report given to RN and Post -op Vital signs reviewed and stable  Post vital signs: Reviewed and stable  Last Vitals:  Vitals Value Taken Time  BP 109/70 04/18/20 1126  Temp    Pulse 82 04/18/20 1129  Resp 17 04/18/20 1129  SpO2 97 % 04/18/20 1129  Vitals shown include unvalidated device data.  Last Pain:  Vitals:   04/18/20 0916  TempSrc:   PainSc: 0-No pain      Patients Stated Pain Goal: 4 (10/62/69 4854)  Complications: No complications documented.

## 2020-04-18 NOTE — Op Note (Signed)
Preoperative diagnosis: Left mid ureteral left renal calculi  Postoperative diagnosis: Left distal ureteral stone, left proximal ureteral stone, left renal stones  Principal procedure: Cystoscopy, left retrograde ureteropyelogram, fluoroscopic interpretation, left ureteroscopy (semirigid and flexible), holmium laser lithotripsy and extraction of left distal ureteral stone, holmium laser lithotripsy of left proximal ureteral stone with partial removal (some stones became extra ureteral and were not removed) holmium laser lithotripsy of left renal calculi/dusting, placement of 6 French by 26 4 cm contour double-J stent without tether  Surgeon: Tamma Brigandi  Anesthesia: General with LMA  Drains: Above-mentioned stent  Estimated blood loss: Less than 10 mL  Complications: Slight perforation of left proximal ureter with extravasation of stones, they remained extra ureteral  Indications: 54 year old female presenting recently with significant left flank pain as well as left mid ureteral and left renal calculi.  Due to the patient's symptoms she presents at this time for ureteroscopic management of these, as they were multifocal and not candidates for shockwave lithotripsy.  I explained the procedure of ureteroscopy with the patient.  Risks and complications have been discussed as well including the risk of infection, bleeding, anesthetic complications, the need for stent, ureteral injury, second step/stage at a later time.  She understands these and desires to proceed.  Findings: Urothelium of the bladder was normal.  Ureteral orifice ease were normally configured and placed.  Retrograde study of the left ureter revealed a large obstructing stone at the left distal ureter approximately 1 to 2 cm above the UVJ.  There was significant hydronephrosis above this.  Additionally, there was an obstructing large proximal ureteral stone with significant pyelocaliectasis.  Ureteroscopically both proximal and distal  ureteral stones were quite impacted, especially the proximal ureteral stone.  There were no urothelial lesions but there was significant urothelial inflammation from the stones.  Renal pelvis and calyces were blown out, there were 2-3 small stones in the upper pole calyces.  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  Description of procedure: The patient was properly identified and marked in the holding area.  She was taken to the operating room where general anesthetic was administered with the LMA.  She was placed in the dorsolithotomy position.  Genitalia and perineum were prepped and draped, proper timeout was performed.  Cystoscope was advanced to the bladder, left retrograde ureteropyelogram was performed using 6 [place] open-ended catheter.  It was difficult to negotiate a sensor tip guidewire around the distal stone, however this was eventually accomplished.  I then advanced the open-ended catheter over the guidewire once into the proximal ureter, as the proximal ureteral stone was also obstructing and it took some time to get the guidewire past the stone.  It was then advanced into the upper pole calyceal system.  Adequate positioning was confirmed by passing the open-ended catheter over top of the guidewire, remove the guidewire and seeing a hydronephrotic drip.  The guidewire was replaced, the open-ended catheter was removed.  Ureteral orifice was dilated first with the inner core and then the entire 12/14 ureteral access catheter.  I then removed the cystoscope and a 6 French dual-lumen semirigid ureteroscope was advanced into the left distal ureter where the stone was seen.  Obvious urothelial changes from the impacted stone were noted.  Using the 200 m fiber in the laser energy set at 0.5 and 30 Hz, the stone was fragmented into multiple fragments which were then extracted using the engage basket.  Tiny sand-like fragments were left to pass on their own.  Once the distal stone  was managed, I removed  the ureteroscope and using the ureteral access catheter placed a safety wire.  The 6 French dual-lumen flexible ureteroscope was then passed through the access catheter up to the proximal stone.  This was quite impacted.  The same energy settings were used to fragment the stone into larger fragments.  During this procedure, there was obvious perforation of the ureter posteriorly.  The larger fragments which were still intraureteral were extracted.  I felt it judicious to leave the extra ureteral fragments alone, as with the ureter healing, these should not be clinically significant.  The scope was advanced into the pyelocalyceal system.  Upper pole stones, approximately 3 to 4 mm in size, were dusted with the laser energy.  Fragments were not removed.  Thorough inspection of the pyelocalyceal system was performed, no significant stone burden was seen although several Randall's plaques were noted.  At this point, the scope was removed.  I then placed the cystoscope and rinse the small stones from the bladder out, these were saved for specimen.

## 2020-04-18 NOTE — Interval H&P Note (Signed)
History and Physical Interval Note:  04/18/2020 9:34 AM  Michele Hall  has presented today for surgery, with the diagnosis of LEFT URETERAL / RENAL STONES.  The various methods of treatment have been discussed with the patient and family. After consideration of risks, benefits and other options for treatment, the patient has consented to  Procedure(s) with comments: CYSTOSCOPY WITH RETROGRADE PYELOGRAM, URETEROSCOPY , STONE EXTRACTION AND STENT PLACEMENT (Left) - 43 MINS HOLMIUM LASER APPLICATION (Left) as a surgical intervention.  The patient's history has been reviewed, patient examined, no change in status, stable for surgery.  I have reviewed the patient's chart and labs.  Questions were answered to the patient's satisfaction.     [name] Jarone Ostergaard

## 2020-04-18 NOTE — H&P (Signed)
H&P  Chief Complaint: Left ureteral and renal calculi  History of Present Illness: Michele Hall. Michele Hall is a 54 y.o. year old female who recently presented with left ureteral stone, symptomatic.  She had associated left hydroureteronephrosis.  She additionally has a small amount of stones in the left kidney.  We will proceed with cystoscopy, left retrograde, ureteroscopy, laser and extraction of left ureteral and renal stones.  Past Medical History:  Diagnosis Date  . Anal fissure - posterior 2015  . Anemia   . Gallstones   . GERD (gastroesophageal reflux disease)   . History of kidney stones   . Hypertension   . IBS   . Morbid obesity (Michele Hall)     Past Surgical History:  Procedure Laterality Date  . CHOLECYSTECTOMY    . DILITATION & CURRETTAGE/HYSTROSCOPY WITH HYDROTHERMAL ABLATION N/A 09/13/2012   Procedure: DILATATION & CURETTAGE/HYSTEROSCOPY WITH HYDROTHERMAL ABLATION;  Surgeon: Osborne Oman, MD;  Location: [facility];  Service: Gynecology;  Laterality: N/A;  . TUBAL LIGATION      Home Medications:  Medications Prior to Admission  Medication Sig Dispense Refill  . cyanocobalamin 1000 MCG tablet Take 1,000 mcg by mouth daily.    . fexofenadine (ALLEGRA) 180 MG tablet TAKE 1 TABLET BY MOUTH EVERY DAY (Patient taking differently: Take 180 mg by mouth daily as needed for allergies. ) 90 tablet 3  . Probiotic Product ([employer] ADV PROBIOTIC GUMMIES PO) Take 1 tablet by mouth in the morning.    Marland Kitchen spironolactone (ALDACTONE) 50 MG tablet TAKE 1 TABLET BY MOUTH EVERY DAY (Patient taking differently: Take 50 mg by mouth daily. ) 90 tablet 0  . tamsulosin (FLOMAX) 0.4 MG CAPS capsule Take 1 capsule (0.4 mg total) by mouth daily. 30 capsule 0  . hydrOXYzine (VISTARIL) 25 MG capsule Take 25-50 mg by mouth at bedtime. (Patient not taking: Reported on 04/12/2020)      Allergies:  Allergies  Allergen Reactions  . Requip [Ropinirole Hcl] Other (See Comments)    Headache, Feels "sick"    Family History   Problem Relation Age of Onset  . Stroke Mother   . Kidney disease Father   . Coronary artery disease Father   . Aneurysm Father        AAA  . Hypertension Father   . Seizures Sister   . Heart attack Maternal Grandfather   . Cancer Sister        ovarian/uterus  . Tongue cancer Sister   . Colon cancer Neg Hx   . Stomach cancer Neg Hx   . Urticaria Neg Hx   . Immunodeficiency Neg Hx   . Eczema Neg Hx   . Atopy Neg Hx   . Asthma Neg Hx   . Angioedema Neg Hx   . Allergic rhinitis Neg Hx     Social History:  reports that she quit smoking about 18 years ago. She has never used smokeless tobacco. She reports that she does not drink alcohol and does not use drugs.  ROS: A complete review of systems was performed.  All systems are negative except for pertinent findings as noted.  Physical Exam:  Vital signs in last 24 hours: Temp:  [98.2 F (36.8 C)] 98.2 F (36.8 C) (11/11 0915) Pulse Rate:  [91] 91 (11/11 0915) Resp:  [18] 18 (11/11 0915) BP: (119)/(84) 119/84 (11/11 0915) SpO2:  [96 %] 96 % (11/11 0915) Weight:  [115.9 kg] 115.9 kg (11/11 0916) General:  Alert and oriented, No acute distress HEENT: Normocephalic,  atraumatic Neck: No JVD or lymphadenopathy Cardiovascular: Regular rate  Lungs: Normal inspiratory/expiratory excursion Abdomen: Soft, nontender, nondistended, no abdominal masses Back: No CVA tenderness Extremities: No edema Neurologic: Grossly intact  Laboratory Data:  No results found for this or any previous visit (from the past 24 hour(s)). Recent Results (from the past 240 hour(s))  Urine culture     Status: None   Collection Time: 04/10/20  6:06 PM   Specimen: Urine, Random  Result Value Ref Range Status   Specimen Description URINE, RANDOM  Final   Special Requests NONE  Final   Culture   Final    NO GROWTH Performed at [facility], [address] [place], [place] [address]    Report Status 04/11/2020 FINAL  Final  SARS CORONAVIRUS 2  (TAT 6-24 HRS) Nasopharyngeal Nasopharyngeal Swab     Status: None   Collection Time: 04/16/20 11:07 AM   Specimen: Nasopharyngeal Swab  Result Value Ref Range Status   SARS Coronavirus 2 NEGATIVE NEGATIVE Final    Comment: (NOTE) SARS-CoV-2 target nucleic acids are NOT DETECTED.  The SARS-CoV-2 RNA is generally detectable in upper and lower respiratory specimens during the acute phase of infection. Negative results do not preclude SARS-CoV-2 infection, do not rule out co-infections with other pathogens, and should not be used as the sole basis for treatment or other patient management decisions. Negative results must be combined with clinical observations, patient history, and epidemiological information. The expected result is Negative.  Fact Sheet for Patients: SugarRoll.be  Fact Sheet for Healthcare Providers: https://www.woods-mathews.com/  This test is not yet approved or cleared by the [place] FDA and  has been authorized for detection and/or diagnosis of SARS-CoV-2 by FDA under an Emergency Use Authorization (EUA). This EUA will remain  in effect (meaning this test can be used) for the duration of the COVID-19 declaration under Se ction 564(b)(1) of the Act, 21 U.S.C. section 360bbb-3(b)(1), unless the authorization is terminated or revoked sooner.  Performed at [facility], [place] [address], [place], [place] [address]    Creatinine: No results for input(s): CREATININE in the last 168 hours.  Radiologic Imaging: No results found.  Impression/Assessment:  Left ureteral and renal stones  Plan:  Cystoscopy, left retrograde, ureteroscopy, laser and extraction of stones.  I discussed the procedure as well as risks and complications as well as the need for stent with the patient.  She understands and desires to proceed.  [name] Trayon Krantz 04/18/2020, 9:25 AM  [name]. Chanler Mendonca MD

## 2020-04-18 NOTE — Anesthesia Procedure Notes (Signed)
Procedure Name: LMA Insertion Performed by: Rosaland Lao, CRNA Pre-anesthesia Checklist: Patient identified, Emergency Drugs available, Suction available and Patient being monitored Patient Re-evaluated:Patient Re-evaluated prior to induction Oxygen Delivery Method: Circle system utilized Preoxygenation: Pre-oxygenation with 100% oxygen Induction Type: IV induction LMA: LMA inserted LMA Size: 4.0 Number of attempts: 1 Placement Confirmation: positive ETCO2 and breath sounds checked- equal and bilateral Tube secured with: Tape Dental Injury: Teeth and Oropharynx as per pre-operative assessment

## 2020-04-18 NOTE — Anesthesia Preprocedure Evaluation (Addendum)
Anesthesia Evaluation  Patient identified by MRN, date of birth, ID band Patient awake    Reviewed: Allergy & Precautions, NPO status , Patient's Chart, lab work & pertinent test results  Airway Mallampati: III  TM Distance: >3 FB Neck ROM: Full    Dental no notable dental hx. (+) Dental Advisory Given   Pulmonary former smoker,    Pulmonary exam normal        Cardiovascular hypertension, Normal cardiovascular exam     Neuro/Psych negative neurological ROS  negative psych ROS   GI/Hepatic Neg liver ROS, GERD  ,  Endo/Other  negative endocrine ROS  Renal/GU negative Renal ROS     Musculoskeletal negative musculoskeletal ROS (+)   Abdominal   Peds  Hematology negative hematology ROS (+)   Anesthesia Other Findings   Reproductive/Obstetrics                            Anesthesia Physical Anesthesia Plan  ASA: III  Anesthesia Plan: General   Post-op Pain Management:    Induction: Intravenous  PONV Risk Score and Plan: 4 or greater and Ondansetron, Dexamethasone, Midazolam and Scopolamine patch - Pre-op  Airway Management Planned: LMA  Additional Equipment: None  Intra-op Plan:   Post-operative Plan: Extubation in OR  Informed Consent: I have reviewed the patients History and Physical, chart, labs and discussed the procedure including the risks, benefits and alternatives for the proposed anesthesia with the patient or authorized representative who has indicated his/her understanding and acceptance.     Dental advisory given  Plan Discussed with: CRNA and Anesthesiologist  Anesthesia Plan Comments:        Anesthesia Quick Evaluation

## 2020-04-18 NOTE — Discharge Instructions (Signed)

## 2020-04-19 ENCOUNTER — Encounter (HOSPITAL_COMMUNITY): Payer: Self-pay | Admitting: Urology

## 2020-05-02 ENCOUNTER — Other Ambulatory Visit: Payer: Self-pay | Admitting: Family Medicine

## 2020-06-06 ENCOUNTER — Other Ambulatory Visit: Payer: Self-pay | Admitting: Family Medicine

## 2020-12-12 ENCOUNTER — Encounter: Payer: Self-pay | Admitting: Family Medicine

## 2020-12-12 ENCOUNTER — Ambulatory Visit: Payer: BC Managed Care – PPO | Admitting: Family Medicine

## 2020-12-12 ENCOUNTER — Other Ambulatory Visit: Payer: Self-pay

## 2020-12-12 VITALS — BP 114/68 | HR 95 | Temp 97.9°F | Ht 66.0 in | Wt 226.0 lb

## 2020-12-12 DIAGNOSIS — Z87442 Personal history of urinary calculi: Secondary | ICD-10-CM | POA: Diagnosis not present

## 2020-12-12 DIAGNOSIS — N3001 Acute cystitis with hematuria: Secondary | ICD-10-CM | POA: Insufficient documentation

## 2020-12-12 DIAGNOSIS — N898 Other specified noninflammatory disorders of vagina: Secondary | ICD-10-CM | POA: Diagnosis not present

## 2020-12-12 DIAGNOSIS — R3915 Urgency of urination: Secondary | ICD-10-CM | POA: Diagnosis not present

## 2020-12-12 LAB — POCT UA - MICROSCOPIC ONLY

## 2020-12-12 LAB — POCT URINALYSIS DIP (MANUAL ENTRY)
Bilirubin, UA: NEGATIVE
Glucose, UA: NEGATIVE mg/dL
Ketones, POC UA: NEGATIVE mg/dL
Nitrite, UA: NEGATIVE
Spec Grav, UA: 1.01 (ref 1.010–1.025)
Urobilinogen, UA: 0.2 E.U./dL
pH, UA: 6 (ref 5.0–8.0)

## 2020-12-12 MED ORDER — SULFAMETHOXAZOLE-TRIMETHOPRIM 800-160 MG PO TABS: 1.0000 | ORAL_TABLET | Freq: Two times a day (BID) | ORAL | 0 refills | Status: DC

## 2020-12-12 NOTE — Assessment & Plan Note (Signed)
Send for culture.  UA and micro are concerning for UTI.  Will start course of Bactrim BID x 3 days.

## 2020-12-12 NOTE — Progress Notes (Signed)
Patient ID: Michele Hall. [place], female    DOB: 1966/02/21, 55 y.o.   MRN: [id number]  This visit was conducted in person.  BP 114/68   Pulse 95   Temp 97.9 F (36.6 C) (Temporal)   Ht 5\' 6"  (1.676 m)   Wt 226 lb (102.5 kg)   SpO2 99%   BMI 36.48 kg/m    CC:  Chief Complaint  Patient presents with   urine urgency    Subjective:   HPI: Michele Hall. Lang[place] is a 55 y.o. female presenting on 12/12/2020 for urine urgency   She has noted urinary urgency  occ urinary incontinence. Ongoing x several weeks.   No dysuria,  occ increase in frequency.  Always has some intermittent flank and abd pain.  Had blood in urine once last week.. thinks she had a stone.  She is avoiding caffeine.   Vaginal discharge for several weeks.. yellow thick discharge, no change in odor. Occ vaginal itching, vaginal irritated at time.  No changes in soaps.   Has history of kidney stones. 04/2021   Left CYSTOSCOPY WITH RETROGRADE PYELOGRAM, URETEROSCOPY , STONE EXTRACTION AND STENT PLACEMENT with Franchot Gallo, MD  Relevant past medical, surgical, family and social history reviewed and updated as indicated. Interim medical history since our last visit reviewed. Allergies and medications reviewed and updated. Outpatient Medications Prior to Visit  Medication Sig Dispense Refill   cephALEXin (KEFLEX) 500 MG capsule Take 1 capsule (500 mg total) by mouth 2 (two) times daily. 10 capsule 0   cyanocobalamin 1000 MCG tablet Take 1,000 mcg by mouth daily.     fexofenadine (ALLEGRA) 180 MG tablet TAKE 1 TABLET BY MOUTH EVERY DAY (Patient taking differently: Take 180 mg by mouth daily as needed for allergies.) 90 tablet 3   hydrOXYzine (VISTARIL) 25 MG capsule Take 25-50 mg by mouth at bedtime.     montelukast (SINGULAIR) 10 MG tablet TAKE 1 TABLET BY MOUTH EVERYDAY AT BEDTIME 90 tablet 0   oxybutynin (DITROPAN) 5 MG tablet Take 1 tablet (5 mg total) by mouth every 8 (eight) hours as needed for bladder spasms. 30  tablet 1   Probiotic Product ([employer]S ADV PROBIOTIC GUMMIES PO) Take 1 tablet by mouth in the morning.     spironolactone (ALDACTONE) 50 MG tablet TAKE 1 TABLET BY MOUTH EVERY DAY 90 tablet 3   No facility-administered medications prior to visit.     Per HPI unless specifically indicated in ROS section below Review of Systems  Constitutional:  Negative for fatigue and fever.  HENT:  Negative for congestion.   Eyes:  Negative for pain.  Respiratory:  Negative for cough and shortness of breath.   Cardiovascular:  Negative for chest pain, palpitations and leg swelling.  Gastrointestinal:  Negative for abdominal pain.  Genitourinary:  Negative for dysuria and vaginal bleeding.  Musculoskeletal:  Negative for back pain.  Neurological:  Negative for syncope, light-headedness and headaches.  Psychiatric/Behavioral:  Negative for dysphoric mood.   Objective:  BP 114/68   Pulse 95   Temp 97.9 F (36.6 C) (Temporal)   Ht 5\' 6"  (1.676 m)   Wt 226 lb (102.5 kg)   SpO2 99%   BMI 36.48 kg/m   Wt Readings from Last 3 Encounters:  12/12/20 226 lb (102.5 kg)  04/18/20 255 lb 8 oz (115.9 kg)  04/16/20 255 lb 8 oz (115.9 kg)      Physical Exam Exam conducted with a chaperone present.  Constitutional:  General: She is not in acute distress.    Appearance: Normal appearance. She is well-developed. She is not ill-appearing or toxic-appearing.  HENT:     Head: Normocephalic.     Right Ear: Hearing, tympanic membrane, ear canal and external ear normal. Tympanic membrane is not erythematous, retracted or bulging.     Left Ear: Hearing, tympanic membrane, ear canal and external ear normal. Tympanic membrane is not erythematous, retracted or bulging.     Nose: No mucosal edema or rhinorrhea.     Right Sinus: No maxillary sinus tenderness or frontal sinus tenderness.     Left Sinus: No maxillary sinus tenderness or frontal sinus tenderness.     Mouth/Throat:     Pharynx: Uvula midline.  Eyes:      General: Lids are normal. Lids are everted, no foreign bodies appreciated.     Conjunctiva/sclera: Conjunctivae normal.     Pupils: Pupils are equal, round, and reactive to light.  Neck:     Thyroid: No thyroid mass or thyromegaly.     Vascular: No carotid bruit.     Trachea: Trachea normal.  Cardiovascular:     Rate and Rhythm: Normal rate and regular rhythm.     Pulses: Normal pulses.     Heart sounds: Normal heart sounds, S1 normal and S2 normal. No murmur heard.   No friction rub. No gallop.  Pulmonary:     Effort: Pulmonary effort is normal. No tachypnea or respiratory distress.     Breath sounds: Normal breath sounds. No decreased breath sounds, wheezing, rhonchi or rales.  Abdominal:     General: Bowel sounds are normal.     Palpations: Abdomen is soft.     Tenderness: There is no abdominal tenderness.  Genitourinary:    Exam position: Lithotomy position.     Pubic Area: No rash.      Labia:        Right: No rash.        Left: No rash.      Comments:  Very minimal vaginal discharge white Musculoskeletal:     Cervical back: Normal range of motion and neck supple.  Skin:    General: Skin is warm and dry.     Findings: No rash.  Neurological:     Mental Status: She is alert.  Psychiatric:        Mood and Affect: Mood is not anxious or depressed.        Speech: Speech normal.        Behavior: Behavior normal. Behavior is cooperative.        Thought Content: Thought content normal.        Judgment: Judgment normal.      Results for orders placed or performed in visit on 12/12/20  POCT urinalysis dipstick  Result Value Ref Range   Color, UA yellow yellow   Clarity, UA cloudy (A) clear   Glucose, UA negative negative mg/dL   Bilirubin, UA negative negative   Ketones, POC UA negative negative mg/dL   Spec Grav, UA 1.010 1.010 - 1.025   Blood, UA trace-intact (A) negative   pH, UA 6.0 5.0 - 8.0   Protein Ur, POC trace (A) negative mg/dL   Urobilinogen, UA 0.2 0.2  or 1.0 E.U./dL   Nitrite, UA Negative Negative   Leukocytes, UA Large (3+) (A) Negative    This visit occurred during the SARS-CoV-2 public health emergency.  Safety protocols were in place, including screening questions prior to the visit, additional  usage of staff PPE, and extensive cleaning of exam room while observing appropriate contact time as indicated for disinfecting solutions.   COVID 19 screen:  No recent travel or known exposure to COVID19 The patient denies respiratory symptoms of COVID 19 at this time. The importance of social distancing was discussed today.   Assessment and Plan Problem List Items Addressed This Visit     Acute cystitis with hematuria     Send for culture.  UA and micro are concerning for UTI.  Will start course of Bactrim BID x 3 days.       Relevant Orders   POCT UA - Microscopic Only   History of nephrolithiasis   Vaginal discharge    Send out for wet prep.  No clear sign of post menopausal atrophy       Relevant Orders   WET PREP BY MOLECULAR PROBE   Other Visit Diagnoses     Urgency of urination    -  Primary   Relevant Orders   POCT urinalysis dipstick (Completed)   Urine Culture          Eliezer Lofts, MD

## 2020-12-12 NOTE — Patient Instructions (Signed)
Start antibiotics today for urinary infection. Increase water.  We will call with vaginal test as well as urine culture results.

## 2020-12-12 NOTE — Assessment & Plan Note (Signed)
Send out for wet prep.  No clear sign of post menopausal atrophy

## 2020-12-13 ENCOUNTER — Other Ambulatory Visit: Payer: Self-pay | Admitting: Family Medicine

## 2020-12-13 LAB — URINE CULTURE
MICRO NUMBER:: 12092016
Result:: NO GROWTH
SPECIMEN QUALITY:: ADEQUATE

## 2020-12-13 LAB — WET PREP BY MOLECULAR PROBE
Candida species: NOT DETECTED
MICRO NUMBER:: 12092044
SPECIMEN QUALITY:: ADEQUATE
Trichomonas vaginosis: NOT DETECTED

## 2020-12-13 MED ORDER — METRONIDAZOLE 0.75 % VA GEL: 1.0000 | Freq: Every day | VAGINAL | 0 refills | Status: AC

## 2020-12-27 ENCOUNTER — Other Ambulatory Visit: Payer: Self-pay | Admitting: Family Medicine

## 2020-12-27 MED ORDER — METRONIDAZOLE 500 MG PO TABS: 500.0000 mg | ORAL_TABLET | Freq: Two times a day (BID) | ORAL | 0 refills | Status: DC

## 2020-12-27 NOTE — Progress Notes (Signed)
Sent in metronidazole given report by husband at [facility] today.. symptoms partially but not complete improved.

## 2020-12-30 ENCOUNTER — Other Ambulatory Visit: Payer: Self-pay | Admitting: Family Medicine

## 2021-01-15 ENCOUNTER — Other Ambulatory Visit (HOSPITAL_COMMUNITY)
Admission: RE | Admit: 2021-01-15 | Discharge: 2021-01-15 | Disposition: A | Discharge status: Home / Self Care | Payer: BC Managed Care – PPO | Source: Ambulatory Visit | Attending: Family Medicine | Admitting: Family Medicine

## 2021-01-15 ENCOUNTER — Ambulatory Visit (INDEPENDENT_AMBULATORY_CARE_PROVIDER_SITE_OTHER): Payer: BC Managed Care – PPO | Admitting: Family Medicine

## 2021-01-15 ENCOUNTER — Other Ambulatory Visit: Payer: Self-pay

## 2021-01-15 ENCOUNTER — Encounter: Payer: Self-pay | Admitting: Family Medicine

## 2021-01-15 VITALS — BP 125/88 | HR 80 | Ht 66.0 in | Wt 264.4 lb

## 2021-01-15 DIAGNOSIS — N898 Other specified noninflammatory disorders of vagina: Secondary | ICD-10-CM | POA: Diagnosis present

## 2021-01-15 DIAGNOSIS — Z8041 Family history of malignant neoplasm of ovary: Secondary | ICD-10-CM

## 2021-01-15 DIAGNOSIS — Z01419 Encounter for gynecological examination (general) (routine) without abnormal findings: Secondary | ICD-10-CM

## 2021-01-15 DIAGNOSIS — Z780 Asymptomatic menopausal state: Secondary | ICD-10-CM | POA: Diagnosis not present

## 2021-01-15 NOTE — Progress Notes (Signed)
   GYNECOLOGY ANNUAL PREVENTATIVE CARE ENCOUNTER NOTE  Subjective:   Michele Hall is a 55 y.o. [id number] female here for a routine annual gynecologic exam.  Current complaints: none currently. Had BV recently and reports more vaginal sx recently. Reports she used metro gel for BV.  Reports perimenopausal/postmenopausal sx (irritability, insomnia, hot flushes). S/P ablation and no cycle since 2014.   Denies abnormal vaginal bleeding, discharge, pelvic pain, problems with intercourse or other gynecologic concerns.    Gynecologic History No LMP recorded. Patient has had an ablation. Contraception: BTL Last Pap: 2019. Results were: normal Last mammogram: 2015. Results were: normal  The following portions of the patient's history were reviewed and updated as appropriate: allergies, current medications, past family history, past medical history, past social history, past surgical history and problem list.  Review of Systems Pertinent items are noted in HPI.   Objective:  BP 125/88   Pulse 80   Ht '5\' 6"'$  (1.676 m)   Wt 264 lb 6.4 oz (119.9 kg)   BMI 42.68 kg/m  CONSTITUTIONAL: Well-developed, well-nourished female in no acute distress.  HENT:  Normocephalic, atraumatic, External right and left ear normal. Oropharynx is clear and moist EYES:  No scleral icterus.  NECK: Normal range of motion, supple, no masses.  Normal thyroid.  SKIN: Skin is warm and dry. No rash noted. Not diaphoretic. No erythema. No pallor. NEUROLOGIC: Alert and oriented to person, place, and time. Normal reflexes, muscle tone coordination. No cranial nerve deficit noted. PSYCHIATRIC: Normal mood and affect. Normal behavior. Normal judgment and thought content. CARDIOVASCULAR: Normal heart rate noted, regular rhythm. 2+ distal pulses. RESPIRATORY: Effort and breath sounds normal, no problems with respiration noted. BREASTS: Symmetric in size. No masses, skin changes, nipple drainage, or lymphadenopathy. ABDOMEN: Obese.  Soft,  no distention noted.  No tenderness, rebound or guarding.  PELVIC: Normal appearing external genitalia; normal appearing vaginal mucosa and cervix.  No abnormal discharge noted.  Pap smear obtained.  Normal uterine size, no other palpable masses, no uterine or adnexal tenderness. MUSCULOSKELETAL: Normal range of motion.    Assessment and Plan:  1) Annual gynecologic examination with pap smear:  Will follow up results of pap smear and manage accordingly. Declined STI screen also ordered today.  Routine preventative health maintenance measures emphasized.  1. Well woman exam with routine gynecological exam - MM 3D SCREEN BREAST BILATERAL; Future - Cytology - PAP - Comprehensive metabolic panel - CBC - TSH - Lipid panel - Cervicovaginal ancillary only  2. Postmenopausal - Follicle stimulating hormone - LH  3. Family history of ovarian cancer - CA 125  4. Vaginal irritation - Cervicovaginal ancillary only  Please refer to After Visit Summary for other counseling recommendations.   Return in about 1 year (around 01/15/2022) for Yearly wellness exam.  [name], MD, MPH, ABFM Attending [facility]

## 2021-01-16 LAB — COMPREHENSIVE METABOLIC PANEL
ALT: 34 IU/L — ABNORMAL HIGH (ref 0–32)
AST: 30 IU/L (ref 0–40)
Albumin/Globulin Ratio: 1.5 (ref 1.2–2.2)
Albumin: 4.4 g/dL (ref 3.8–4.9)
Alkaline Phosphatase: 63 IU/L (ref 44–121)
BUN/Creatinine Ratio: 16 (ref 9–23)
BUN: 13 mg/dL (ref 6–24)
Bilirubin Total: 0.5 mg/dL (ref 0.0–1.2)
CO2: 21 mmol/L (ref 20–29)
Calcium: 9.2 mg/dL (ref 8.7–10.2)
Chloride: 105 mmol/L (ref 96–106)
Creatinine, Ser: 0.8 mg/dL (ref 0.57–1.00)
Globulin, Total: 2.9 g/dL (ref 1.5–4.5)
Glucose: 79 mg/dL (ref 65–99)
Potassium: 4.4 mmol/L (ref 3.5–5.2)
Sodium: 141 mmol/L (ref 134–144)
Total Protein: 7.3 g/dL (ref 6.0–8.5)
eGFR: 88 mL/min/{1.73_m2} (ref >59)

## 2021-01-16 LAB — FOLLICLE STIMULATING HORMONE: FSH: 27.8 m[IU]/mL

## 2021-01-16 LAB — LIPID PANEL
Chol/HDL Ratio: 4.2 ratio (ref 0.0–4.4)
Cholesterol, Total: 171 mg/dL (ref 100–199)
HDL: 41 mg/dL (ref >39)
LDL Chol Calc (NIH): 100 mg/dL — ABNORMAL HIGH (ref 0–99)
Triglycerides: 173 mg/dL — ABNORMAL HIGH (ref 0–149)
VLDL Cholesterol Cal: 30 mg/dL (ref 5–40)

## 2021-01-16 LAB — CBC
Hematocrit: 45.6 % (ref 34.0–46.6)
Hemoglobin: 15.2 g/dL (ref 11.1–15.9)
MCH: 31.7 pg (ref 26.6–33.0)
MCHC: 33.3 g/dL (ref 31.5–35.7)
MCV: 95 fL (ref 79–97)
Platelets: 259 10*3/uL (ref 150–450)
RBC: 4.79 x10E6/uL (ref 3.77–5.28)
RDW: 12.4 % (ref 11.7–15.4)
WBC: 5.9 10*3/uL (ref 3.4–10.8)

## 2021-01-16 LAB — LUTEINIZING HORMONE: LH: 27.3 m[IU]/mL

## 2021-01-16 LAB — TSH: TSH: 2.59 u[IU]/mL (ref 0.450–4.500)

## 2021-01-16 LAB — CA 125: Cancer Antigen (CA) 125: 4.7 U/mL (ref 0.0–38.1)

## 2021-01-17 LAB — CYTOLOGY - PAP
Comment: NEGATIVE
Diagnosis: NEGATIVE
High risk HPV: NEGATIVE

## 2021-01-17 LAB — CERVICOVAGINAL ANCILLARY ONLY
Bacterial Vaginitis (gardnerella): POSITIVE — AB
Candida Glabrata: NEGATIVE
Candida Vaginitis: NEGATIVE
Comment: NEGATIVE
Comment: NEGATIVE
Comment: NEGATIVE

## 2021-01-20 ENCOUNTER — Other Ambulatory Visit: Payer: Self-pay

## 2021-01-20 ENCOUNTER — Ambulatory Visit
Admission: RE | Admit: 2021-01-20 | Discharge: 2021-01-20 | Disposition: A | Discharge status: Home / Self Care | Payer: BC Managed Care – PPO | Source: Ambulatory Visit | Attending: Family Medicine | Admitting: Family Medicine

## 2021-01-20 DIAGNOSIS — Z01419 Encounter for gynecological examination (general) (routine) without abnormal findings: Secondary | ICD-10-CM

## 2021-01-20 IMAGING — MG MM DIGITAL SCREENING BILAT W/ TOMO AND CAD
6 of 10 series · 6 of 30 positions shown · non-contrast
Comparison: Previous exam(s).

CLINICAL DATA: Screening.

EXAM:
DIGITAL SCREENING BILATERAL MAMMOGRAM WITH TOMOSYNTHESIS AND CAD
TECHNIQUE: Bilateral screening digital craniocaudal and mediolateral oblique
mammograms were obtained. Bilateral screening digital breast
tomosynthesis was performed. The images were evaluated with
computer-aided detection.

[R CC synth-2D]
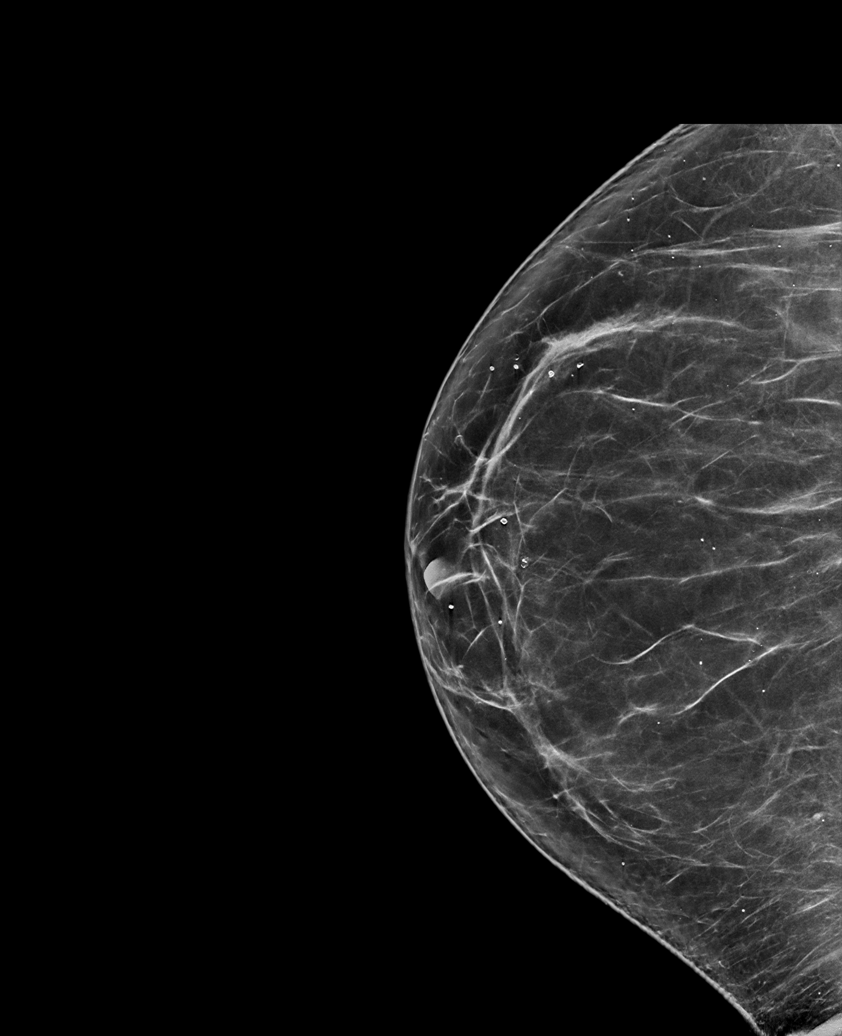

[L CC synth-2D]
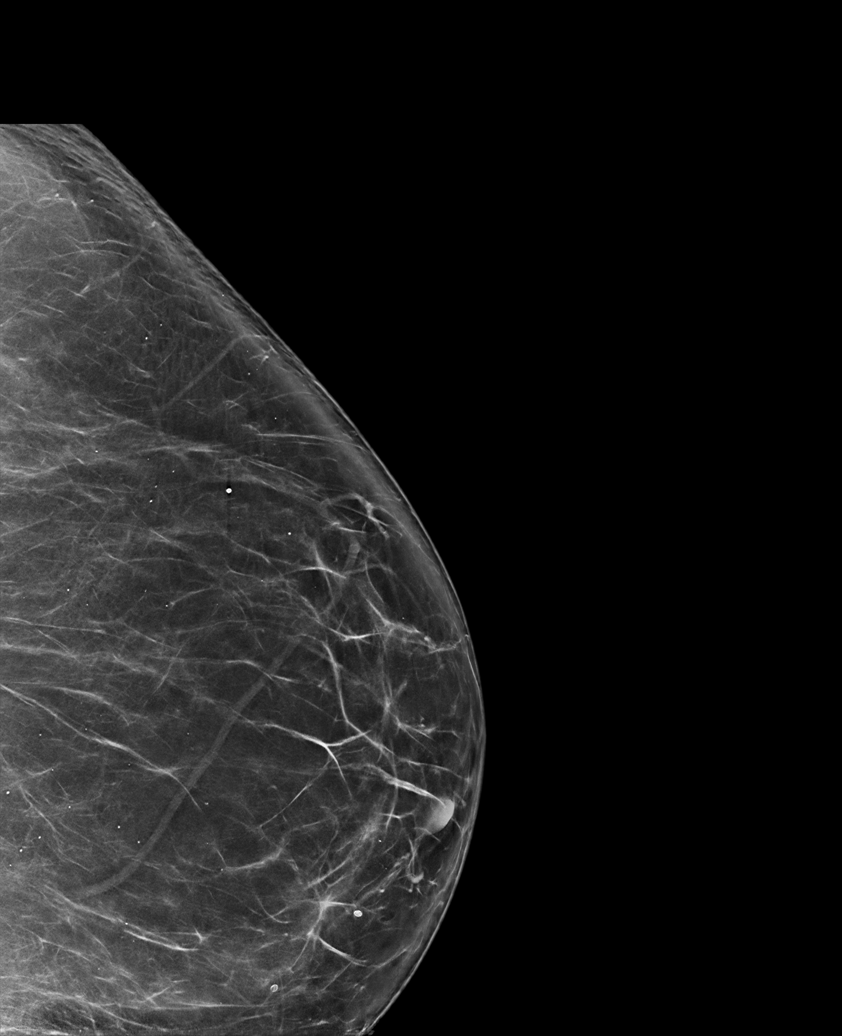

[L MLO synth-2D]
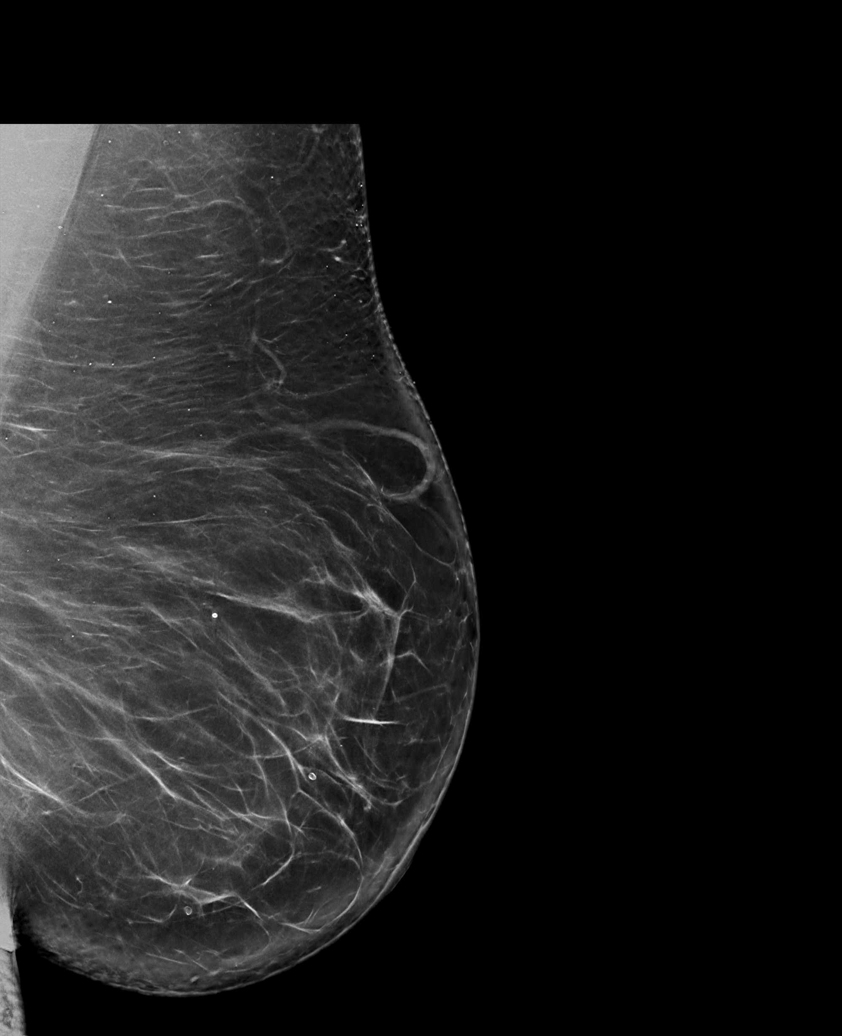

[R CV synth-2D]
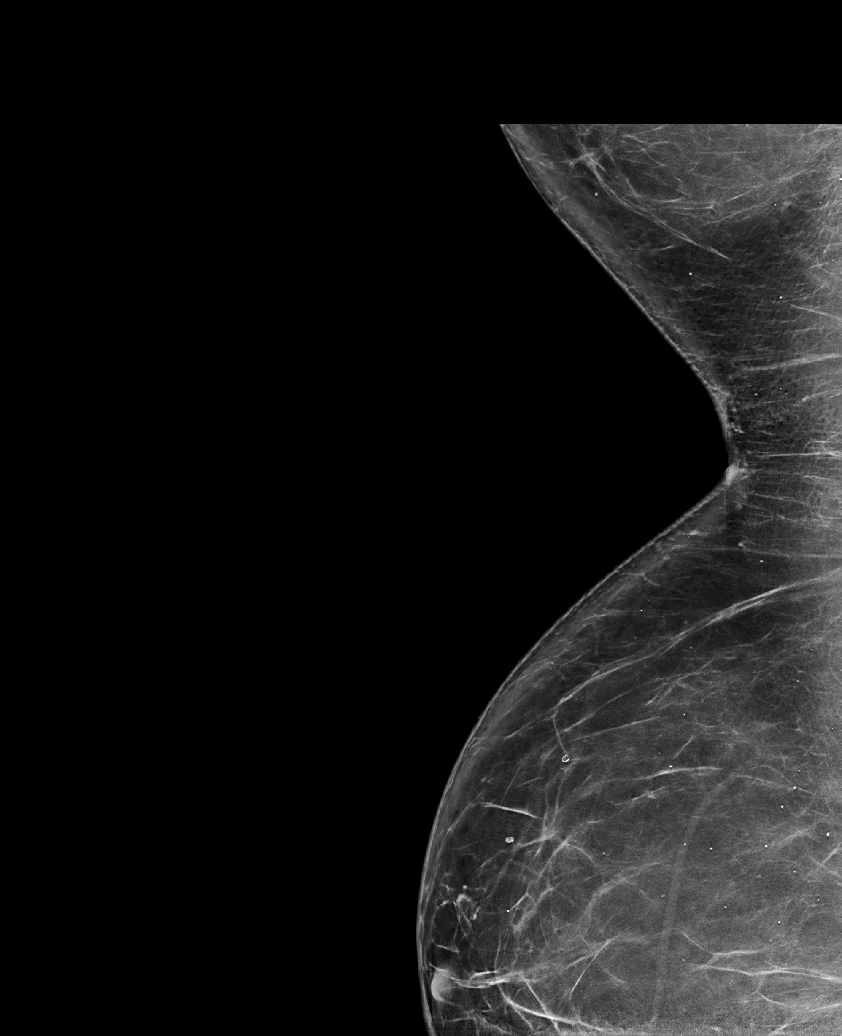

[R MLO synth-2D]
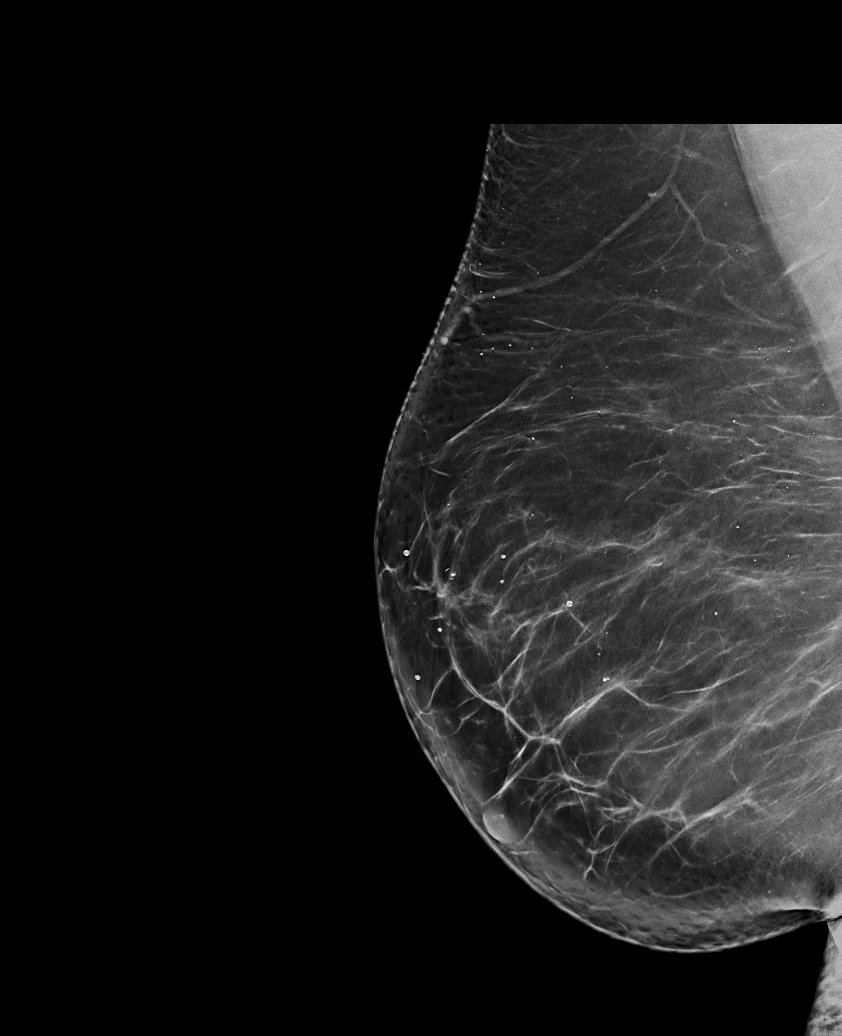

[L MLO tomo · tomo slice 53/106.0]
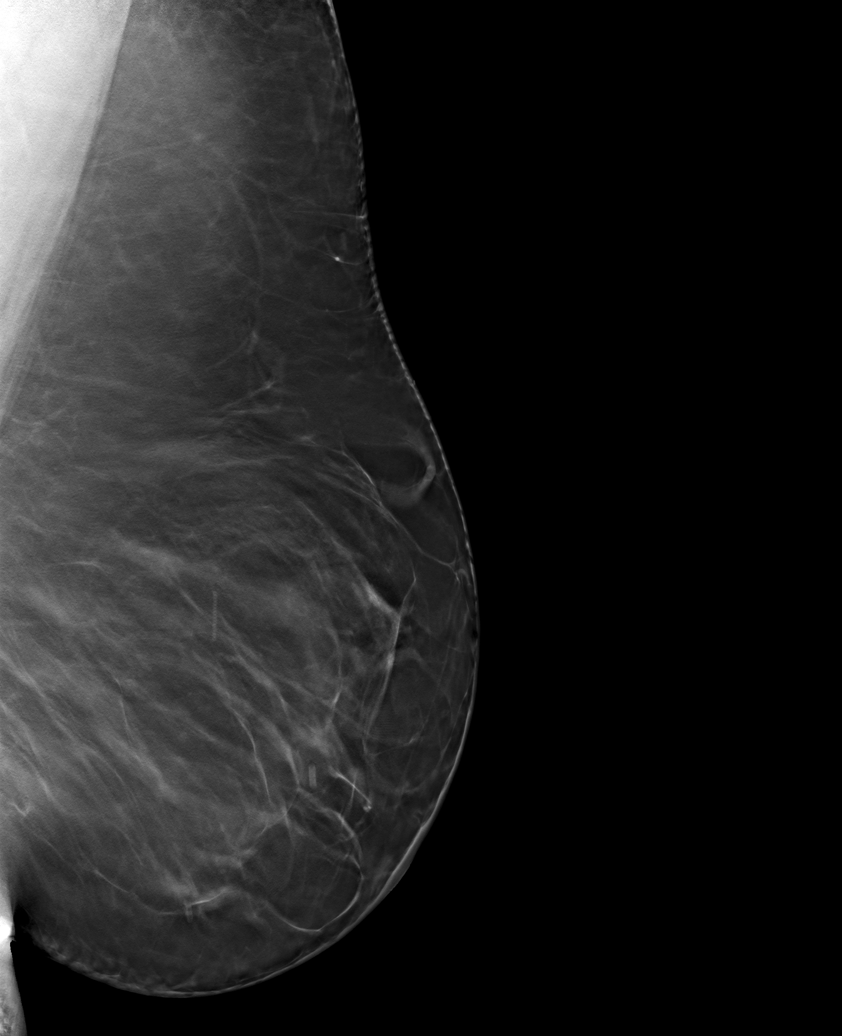

[6 of 30 positions shown; findings below may reference images not displayed]

ACR Breast Density Category b: There are scattered areas of
fibroglandular density.
FINDINGS: There are no findings suspicious for malignancy.
IMPRESSION: No mammographic evidence of malignancy. A result letter of this
screening mammogram will be mailed directly to the patient.

RECOMMENDATION:
Screening mammogram in one year. (Code:[BY])

BI-RADS CATEGORY  1: Negative.

## 2021-01-21 ENCOUNTER — Telehealth: Payer: Self-pay

## 2021-01-21 MED ORDER — TINIDAZOLE 500 MG PO TABS: 2.0000 g | ORAL_TABLET | Freq: Every day | ORAL | 0 refills | Status: AC

## 2021-01-21 MED ORDER — VENLAFAXINE HCL ER 37.5 MG PO CP24: 37.5000 mg | ORAL_CAPSULE | Freq: Every day | ORAL | 11 refills | Status: DC

## 2021-01-21 NOTE — Telephone Encounter (Signed)
Spoke to pt. Pt given results and recommendations per Dr Ernestina Patches. Pt verbalized understanidng. Pt does state having vaginal discharge, irritation and odor. Pt states just had BV last month. Pt asking to try something new for treatment. Pt states wants new Rx, but will still wait out a couple of days before taking Rx.  Rx Tindamax sent to pharmacy on file per protocol.   Pt also asking about medication that she can take for insomnia with menopause symptoms that Dr Ernestina Patches had discussed at [place]. Reviewed notes from OV, nothing noted. Will send message to Dr Ernestina Patches to advise. Pt agreeable to plan of care. Pt requested if no answer, ok to leave VM.   Colletta Maryland, RN

## 2021-01-21 NOTE — Telephone Encounter (Signed)
-----   Message from [name], MD sent at 01/20/2021  1:22 PM EDT ----- BV present-- call patient and ask about symptoms. If present, treat per standing for BV

## 2021-02-14 ENCOUNTER — Other Ambulatory Visit: Payer: Self-pay | Admitting: Family Medicine

## 2021-04-11 ENCOUNTER — Telehealth: Payer: Self-pay | Admitting: Family Medicine

## 2021-04-11 NOTE — Telephone Encounter (Signed)
Please schedule CPE with Dr. Diona Browner.

## 2021-04-11 NOTE — Telephone Encounter (Signed)
Called Michele Hall and got her scheduled for 12/2 @240 

## 2021-05-09 ENCOUNTER — Other Ambulatory Visit: Payer: Self-pay | Admitting: Urology

## 2021-05-09 ENCOUNTER — Encounter: Payer: BC Managed Care – PPO | Admitting: Family Medicine

## 2021-05-12 ENCOUNTER — Other Ambulatory Visit (HOSPITAL_COMMUNITY): Payer: Self-pay | Admitting: Urology

## 2021-05-12 DIAGNOSIS — N200 Calculus of kidney: Secondary | ICD-10-CM

## 2021-05-15 ENCOUNTER — Encounter: Payer: Self-pay | Admitting: Family Medicine

## 2021-05-15 ENCOUNTER — Ambulatory Visit (INDEPENDENT_AMBULATORY_CARE_PROVIDER_SITE_OTHER): Payer: BC Managed Care – PPO | Admitting: Family Medicine

## 2021-05-15 ENCOUNTER — Other Ambulatory Visit: Payer: Self-pay

## 2021-05-15 VITALS — BP 100/62 | HR 87 | Temp 98.2°F | Ht 66.0 in | Wt 263.5 lb

## 2021-05-15 DIAGNOSIS — E7800 Pure hypercholesterolemia, unspecified: Secondary | ICD-10-CM | POA: Diagnosis not present

## 2021-05-15 DIAGNOSIS — I10 Essential (primary) hypertension: Secondary | ICD-10-CM | POA: Diagnosis not present

## 2021-05-15 DIAGNOSIS — Z0000 Encounter for general adult medical examination without abnormal findings: Secondary | ICD-10-CM

## 2021-05-15 NOTE — Patient Instructions (Signed)
Work on exercise, weight loss, healthy eating habits.

## 2021-05-15 NOTE — Progress Notes (Signed)
Patient ID: Michele Hall. Triplett, female    DOB: [birth year], 55 y.o.   MRN: [id number]  This visit was conducted in person.  BP 100/62   Pulse 87   Temp 98.2 F (36.8 C) (Temporal)   Ht 5' 6" (1.676 m)   Wt 263 lb 8 oz (119.5 kg)   SpO2 98%   BMI 42.53 kg/m    CC:  Chief Complaint  Patient presents with   Annual Exam    Subjective:   HPI: Michele Hall is a 55 y.o. female presenting on 05/15/2021 for Annual Exam  Hypertension:   She is on aldactone .. good control.  BP Readings from Last 3 Encounters:  05/15/21 100/62  01/15/21 125/88  12/12/20 114/68  Using medication without problems or lightheadedness:  none Chest pain with exertion: none Edema:occ Short of breath:none Average home BPs: Other issues:  Reviewed labs form 01/2021 GYN OV. Lab Results  Component Value Date   CHOL 171 01/15/2021   HDL 41 01/15/2021   LDLCALC 100 (H) 01/15/2021   LDLDIRECT 102.0 04/02/2020   TRIG 173 (H) 01/15/2021   CHOLHDL 4.2 01/15/2021   The 10-year ASCVD risk score (Arnett DK, et al., 2019) is: 1.7%   Values used to calculate the score:     Age: 51 years     Sex: Female     Is Non-Hispanic African American: No     Diabetic: No     Tobacco smoker: No     Systolic Blood Pressure: 539 mmHg     Is BP treated: Yes     HDL Cholesterol: 41 mg/dL     Total Cholesterol: 171 mg/dL   Has 18 mm stone on right.. Dr. Eulogio Ditch planning stent and removal in early 2023.      Relevant past medical, surgical, family and social history reviewed and updated as indicated. Interim medical history since our last visit reviewed. Allergies and medications reviewed and updated. Outpatient Medications Prior to Visit  Medication Sig Dispense Refill   aspirin 81 MG chewable tablet Chew by mouth daily. Pt takes half of tablet     fexofenadine (ALLEGRA) 180 MG tablet TAKE 1 TABLET BY MOUTH EVERY DAY 90 tablet 0   Probiotic Product ([employer] ADV PROBIOTIC GUMMIES PO) Take 1 tablet by mouth in the  morning.     spironolactone (ALDACTONE) 50 MG tablet TAKE 1 TABLET BY MOUTH EVERY DAY 90 tablet 3   hydrOXYzine (VISTARIL) 25 MG capsule Take 25-50 mg by mouth at bedtime. (Patient not taking: Reported on 01/15/2021)     oxybutynin (DITROPAN) 5 MG tablet Take 1 tablet (5 mg total) by mouth every 8 (eight) hours as needed for bladder spasms. (Patient not taking: Reported on 01/15/2021) 30 tablet 1   venlafaxine XR (EFFEXOR XR) 37.5 MG 24 hr capsule Take 1 capsule (37.5 mg total) by mouth daily with breakfast. 30 capsule 11   No facility-administered medications prior to visit.     Per HPI unless specifically indicated in ROS section below Review of Systems  Constitutional:  Negative for fatigue and fever.  HENT:  Negative for congestion.   Eyes:  Negative for pain.  Respiratory:  Negative for cough and shortness of breath.   Cardiovascular:  Negative for chest pain, palpitations and leg swelling.  Gastrointestinal:  Negative for abdominal pain.  Genitourinary:  Negative for dysuria and vaginal bleeding.  Musculoskeletal:  Negative for back pain.  Neurological:  Negative for syncope, light-headedness and headaches.  Psychiatric/Behavioral:  Negative for dysphoric mood.   Objective:  BP 100/62   Pulse 87   Temp 98.2 F (36.8 C) (Temporal)   Ht 5' 6" (1.676 m)   Wt 263 lb 8 oz (119.5 kg)   SpO2 98%   BMI 42.53 kg/m   Wt Readings from Last 3 Encounters:  05/15/21 263 lb 8 oz (119.5 kg)  01/15/21 264 lb 6.4 oz (119.9 kg)  12/12/20 226 lb (102.5 kg)      Physical Exam Constitutional:      General: She is not in acute distress.    Appearance: Normal appearance. She is well-developed. She is obese. She is not ill-appearing or toxic-appearing.  HENT:     Head: Normocephalic.     Right Ear: Hearing, tympanic membrane, ear canal and external ear normal. Tympanic membrane is not erythematous, retracted or bulging.     Left Ear: Hearing, tympanic membrane, ear canal and external ear  normal. Tympanic membrane is not erythematous, retracted or bulging.     Nose: No mucosal edema or rhinorrhea.     Right Sinus: No maxillary sinus tenderness or frontal sinus tenderness.     Left Sinus: No maxillary sinus tenderness or frontal sinus tenderness.     Mouth/Throat:     Pharynx: Uvula midline.  Eyes:     General: Lids are normal. Lids are everted, no foreign bodies appreciated.     Conjunctiva/sclera: Conjunctivae normal.     Pupils: Pupils are equal, round, and reactive to light.  Neck:     Thyroid: No thyroid mass or thyromegaly.     Vascular: No carotid bruit.     Trachea: Trachea normal.  Cardiovascular:     Rate and Rhythm: Normal rate and regular rhythm.     Pulses: Normal pulses.     Heart sounds: Normal heart sounds, S1 normal and S2 normal. No murmur heard.   No friction rub. No gallop.  Pulmonary:     Effort: Pulmonary effort is normal. No tachypnea or respiratory distress.     Breath sounds: Normal breath sounds. No decreased breath sounds, wheezing, rhonchi or rales.  Abdominal:     General: Bowel sounds are normal.     Palpations: Abdomen is soft.     Tenderness: There is no abdominal tenderness.  Musculoskeletal:     Cervical back: Normal range of motion and neck supple.  Skin:    General: Skin is warm and dry.     Findings: No rash.  Neurological:     Mental Status: She is alert.  Psychiatric:        Mood and Affect: Mood is not anxious or depressed.        Speech: Speech normal.        Behavior: Behavior normal. Behavior is cooperative.        Thought Content: Thought content normal.        Judgment: Judgment normal.      Results for orders placed or performed in visit on 77/93/90  Follicle stimulating hormone  Result Value Ref Range   FSH 27.8 mIU/mL  LH  Result Value Ref Range   LH 27.3 mIU/mL  Comprehensive metabolic panel  Result Value Ref Range   Glucose 79 65 - 99 mg/dL   BUN 13 6 - 24 mg/dL   Creatinine, Ser 0.80 0.57 - 1.00  mg/dL   eGFR 88 >59 mL/min/1.73   BUN/Creatinine Ratio 16 9 - 23   Sodium 141 134 - 144 mmol/L   Potassium 4.4  3.5 - 5.2 mmol/L   Chloride 105 96 - 106 mmol/L   CO2 21 20 - 29 mmol/L   Calcium 9.2 8.7 - 10.2 mg/dL   Total Protein 7.3 6.0 - 8.5 g/dL   Albumin 4.4 3.8 - 4.9 g/dL   Globulin, Total 2.9 1.5 - 4.5 g/dL   Albumin/Globulin Ratio 1.5 1.2 - 2.2   Bilirubin Total 0.5 0.0 - 1.2 mg/dL   Alkaline Phosphatase 63 44 - 121 IU/L   AST 30 0 - 40 IU/L   ALT 34 (H) 0 - 32 IU/L  CBC  Result Value Ref Range   WBC 5.9 3.4 - 10.8 x10E3/uL   RBC 4.79 3.77 - 5.28 x10E6/uL   Hemoglobin 15.2 11.1 - 15.9 g/dL   Hematocrit 45.6 34.0 - 46.6 %   MCV 95 79 - 97 fL   MCH 31.7 26.6 - 33.0 pg   MCHC 33.3 31.5 - 35.7 g/dL   RDW 12.4 11.7 - 15.4 %   Platelets 259 150 - 450 x10E3/uL  TSH  Result Value Ref Range   TSH 2.590 0.450 - 4.500 uIU/mL  Lipid panel  Result Value Ref Range   Cholesterol, Total 171 100 - 199 mg/dL   Triglycerides 173 (H) 0 - 149 mg/dL   HDL 41 >39 mg/dL   VLDL Cholesterol Cal 30 5 - 40 mg/dL   LDL Chol Calc (NIH) 100 (H) 0 - 99 mg/dL   Chol/HDL Ratio 4.2 0.0 - 4.4 ratio  CA 125  Result Value Ref Range   Cancer Antigen (CA) 125 4.7 0.0 - 38.1 U/mL  Cytology - PAP  Result Value Ref Range   High risk HPV Negative    Adequacy      Satisfactory for evaluation; transformation zone component PRESENT.   Diagnosis      - Negative for intraepithelial lesion or malignancy (NILM)   Comment Normal Reference Range HPV - Negative   Cervicovaginal ancillary only  Result Value Ref Range   Bacterial Vaginitis (gardnerella) Positive (A)    Candida Vaginitis Negative    Candida Glabrata Negative    Comment Normal Reference Range Candida Species - Negative    Comment      Normal Reference Range Bacterial Vaginosis - Negative   Comment Normal Reference Range Candida Galbrata - Negative     This visit occurred during the SARS-CoV-2 public health emergency.  Safety protocols were  in place, including screening questions prior to the visit, additional usage of staff PPE, and extensive cleaning of exam room while observing appropriate contact time as indicated for disinfecting solutions.   COVID 19 screen:  No recent travel or known exposure to COVID19 The patient denies respiratory symptoms of COVID 19 at this time. The importance of social distancing was discussed today.   Assessment and Plan   The patient's preventative maintenance and recommended screening tests for an annual wellness exam were reviewed in full today. Brought up to date unless services declined.  Counselled on the importance of diet, exercise, and its role in overall health and mortality. The patient's FH and SH was reviewed, including their home life, tobacco status, and drug and alcohol status.   Vaccines: Uptodate withTdap, refused flu Consider shingrix.  Discussed COVID19 vaccine side effects and benefits. Strongly encouraged the patient to get the vaccine. Questions answered. Pap/DVE:  per GYN 01/2021 Mammo: 01/2021 Bone Density: not indicated Colon:  GI Dr. Carlean Purl 09/2013 nml colonoscopy repeat in 2025 Smoking Status: former smoker QUIT 2003  HIV screen:  refused  No ETOH  Problem List Items Addressed This Visit     Essential hypertension, benign    Stable, chronic.  Continue current medication.   Aldactone  50 mg daily       Hyperlipidemia    The 10-year ASCVD risk score (Arnett DK, et al., 2019) is: 1.7%   Values used to calculate the score:     Age: 15 years     Sex: Female     Is Non-Hispanic African American: No     Diabetic: No     Tobacco smoker: No     Systolic Blood Pressure: 789 mmHg     Is BP treated: Yes     HDL Cholesterol: 41 mg/dL     Total Cholesterol: 171 mg/dL       Routine general medical examination at a health care facility - Primary      Eliezer Lofts, MD

## 2021-05-15 NOTE — Assessment & Plan Note (Signed)
The 10-year ASCVD risk score (Arnett DK, et al., 2019) is: 1.7%   Values used to calculate the score:     Age: 55 years     Sex: Female     Is Non-Hispanic African American: No     Diabetic: No     Tobacco smoker: No     Systolic Blood Pressure: 374 mmHg     Is BP treated: Yes     HDL Cholesterol: 41 mg/dL     Total Cholesterol: 171 mg/dL

## 2021-05-15 NOTE — Assessment & Plan Note (Signed)
Stable, chronic.  Continue current medication.   Aldactone  50 mg daily

## 2021-05-30 ENCOUNTER — Other Ambulatory Visit: Payer: Self-pay | Admitting: Family Medicine

## 2021-07-08 ENCOUNTER — Emergency Department (HOSPITAL_COMMUNITY): Payer: BC Managed Care – PPO

## 2021-07-08 ENCOUNTER — Ambulatory Visit (INDEPENDENT_AMBULATORY_CARE_PROVIDER_SITE_OTHER): Payer: BC Managed Care – PPO | Admitting: Family

## 2021-07-08 ENCOUNTER — Other Ambulatory Visit: Payer: Self-pay

## 2021-07-08 ENCOUNTER — Encounter: Payer: Self-pay | Admitting: Family

## 2021-07-08 ENCOUNTER — Emergency Department (HOSPITAL_COMMUNITY)
Admission: EM | Admit: 2021-07-08 | Discharge: 2021-07-09 | Disposition: A | Discharge status: Home / Self Care | Payer: BC Managed Care – PPO | Attending: Emergency Medicine | Admitting: Emergency Medicine

## 2021-07-08 ENCOUNTER — Encounter (HOSPITAL_COMMUNITY): Payer: Self-pay

## 2021-07-08 VITALS — BP 104/76 | HR 107 | Temp 97.3°F | Ht 66.0 in | Wt 263.0 lb

## 2021-07-08 DIAGNOSIS — M48061 Spinal stenosis, lumbar region without neurogenic claudication: Secondary | ICD-10-CM | POA: Insufficient documentation

## 2021-07-08 DIAGNOSIS — R531 Weakness: Secondary | ICD-10-CM | POA: Insufficient documentation

## 2021-07-08 DIAGNOSIS — Z7982 Long term (current) use of aspirin: Secondary | ICD-10-CM | POA: Diagnosis not present

## 2021-07-08 DIAGNOSIS — R1031 Right lower quadrant pain: Secondary | ICD-10-CM | POA: Insufficient documentation

## 2021-07-08 DIAGNOSIS — R102 Pelvic and perineal pain: Secondary | ICD-10-CM

## 2021-07-08 DIAGNOSIS — M79604 Pain in right leg: Secondary | ICD-10-CM

## 2021-07-08 LAB — COMPREHENSIVE METABOLIC PANEL
ALT: 35 U/L (ref 0–44)
AST: 29 U/L (ref 15–41)
Albumin: 4.2 g/dL (ref 3.5–5.0)
Alkaline Phosphatase: 60 U/L (ref 38–126)
Anion gap: 9 (ref 5–15)
BUN: 14 mg/dL (ref 6–20)
CO2: 22 mmol/L (ref 22–32)
Calcium: 8.8 mg/dL — ABNORMAL LOW (ref 8.9–10.3)
Chloride: 108 mmol/L (ref 98–111)
Creatinine, Ser: 0.7 mg/dL (ref 0.44–1.00)
GFR, Estimated: >60 mL/min (ref >60)
Glucose, Bld: 83 mg/dL (ref 70–99)
Potassium: 3.9 mmol/L (ref 3.5–5.1)
Sodium: 139 mmol/L (ref 135–145)
Total Bilirubin: 0.5 mg/dL (ref 0.3–1.2)
Total Protein: 7.9 g/dL (ref 6.5–8.1)

## 2021-07-08 LAB — CBC WITH DIFFERENTIAL/PLATELET
Abs Immature Granulocytes: 0.03 10*3/uL (ref 0.00–0.07)
Basophils Absolute: 0 10*3/uL (ref 0.0–0.1)
Basophils Relative: 1 %
Eosinophils Absolute: 0.2 10*3/uL (ref 0.0–0.5)
Eosinophils Relative: 3 %
HCT: 43.7 % (ref 36.0–46.0)
Hemoglobin: 15.3 g/dL — ABNORMAL HIGH (ref 12.0–15.0)
Immature Granulocytes: 0 %
Lymphocytes Relative: 32 %
Lymphs Abs: 2.5 10*3/uL (ref 0.7–4.0)
MCH: 32.5 pg (ref 26.0–34.0)
MCHC: 35 g/dL (ref 30.0–36.0)
MCV: 92.8 fL (ref 80.0–100.0)
Monocytes Absolute: 1 10*3/uL (ref 0.1–1.0)
Monocytes Relative: 12 %
Neutro Abs: 4.1 10*3/uL (ref 1.7–7.7)
Neutrophils Relative %: 52 %
Platelets: 208 10*3/uL (ref 150–400)
RBC: 4.71 MIL/uL (ref 3.87–5.11)
RDW: 12.3 % (ref 11.5–15.5)
WBC: 8 10*3/uL (ref 4.0–10.5)
nRBC: 0 % (ref 0.0–0.2)

## 2021-07-08 LAB — URINALYSIS, ROUTINE W REFLEX MICROSCOPIC
Bilirubin Urine: NEGATIVE
Glucose, UA: NEGATIVE mg/dL
Ketones, ur: NEGATIVE mg/dL
Nitrite: NEGATIVE
Protein, ur: NEGATIVE mg/dL
Specific Gravity, Urine: >1.046 — ABNORMAL HIGH (ref 1.005–1.030)
WBC, UA: >50 WBC/hpf — ABNORMAL HIGH (ref 0–5)
pH: 6 (ref 5.0–8.0)

## 2021-07-08 LAB — LIPASE, BLOOD: Lipase: 48 U/L (ref 11–51)

## 2021-07-08 IMAGING — MR MR HEAD W/O CM
11 series · 48 of 48 positions shown · non-contrast
Comparison: None.

CLINICAL DATA: Neuro deficit, stroke suspected

EXAM:
MRI HEAD WITHOUT CONTRAST
TECHNIQUE: Multiplanar, multiecho pulse sequences of the brain and surrounding
structures were obtained without intravenous contrast.

[Series 5: dwi_tracew · axial · 3.0mm · 1.08mm/px · z∈[+7,+152]mm · 8 of 102 slices shown]
[im 1/102]
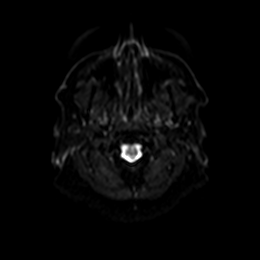
[im 15/102]
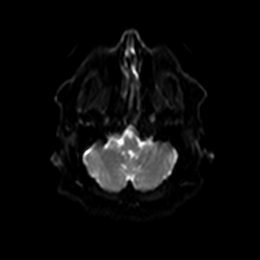
[im 29/102]
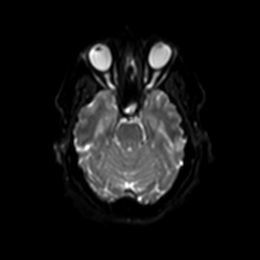
[im 44/102]
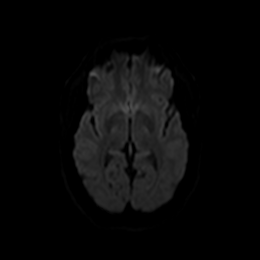
[im 58/102]
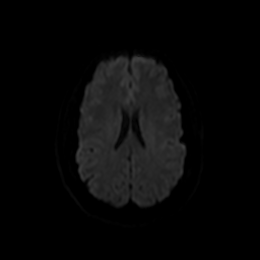
[im 73/102]
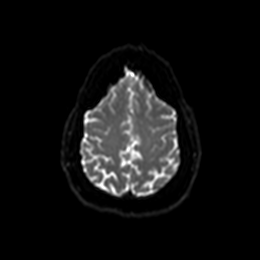
[im 87/102]
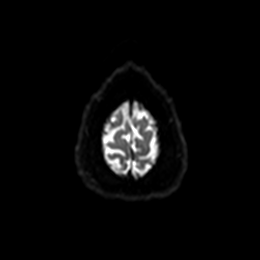
[im 102/102]
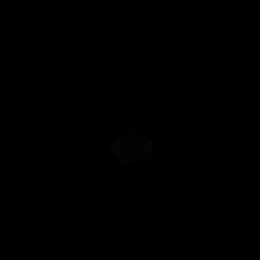

[Series 6: dwi_adc · axial · 3.0mm · 1.08mm/px · z∈[+7,+152]mm · 4 of 51 slices shown]
[im 1/51]
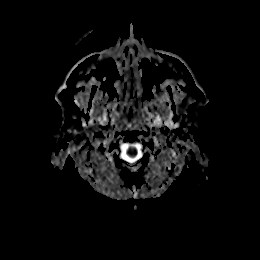
[im 17/51]
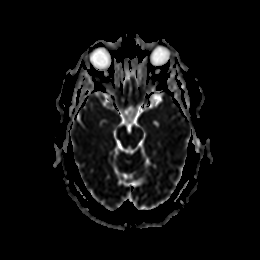
[im 34/51]
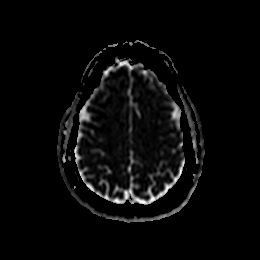
[im 51/51]
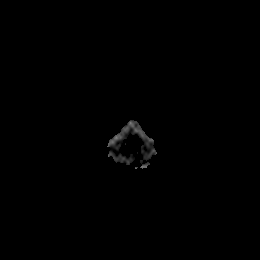

[Series 7: T2 · sagittal · 5.0mm · 0.47mm/px · 2 of 24 slices shown (1 of 2)]
[im 1/24]
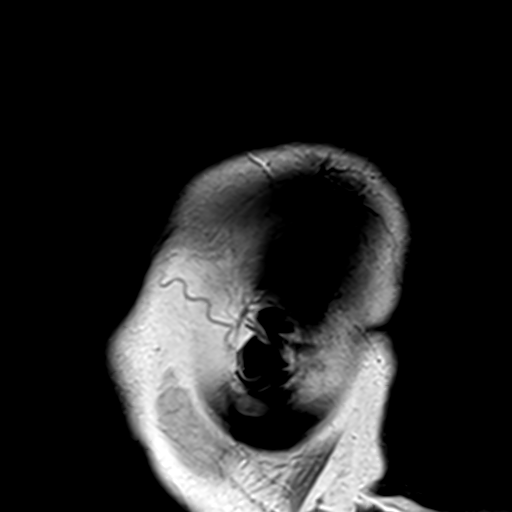
[im 24/24]
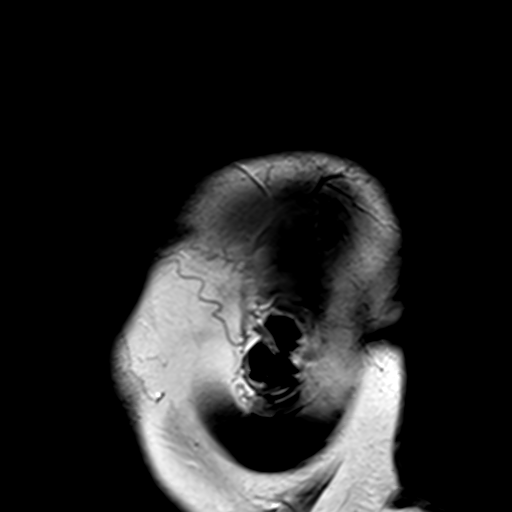

[Series 8: T2 · axial · 5.0mm · 0.45mm/px · z∈[+1,+146]mm · 2 of 24 slices shown (2 of 2)]
[im 1/24]
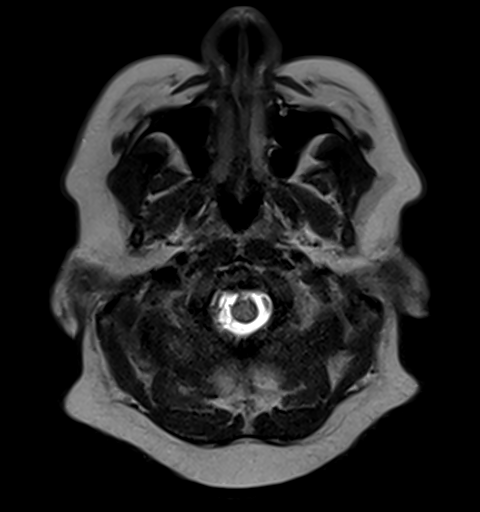
[im 24/24]
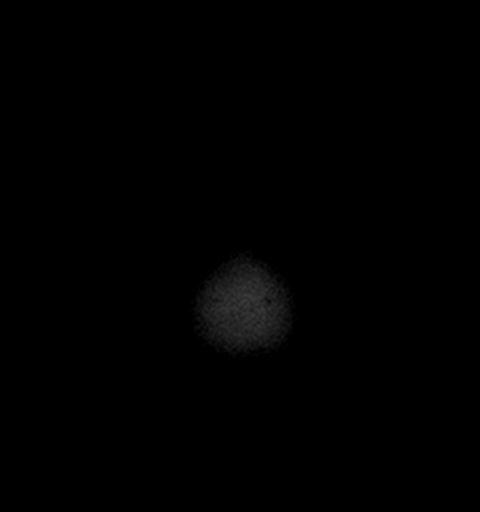

[Series 9: GRE · axial · 3.0mm · 0.45mm/px · z∈[-0,+144]mm · 4 of 51 slices shown]
[im 1/51]
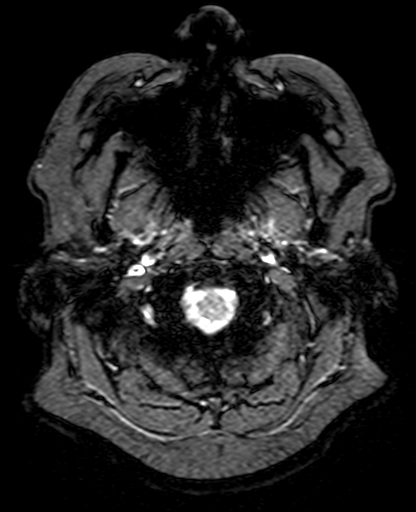
[im 17/51]
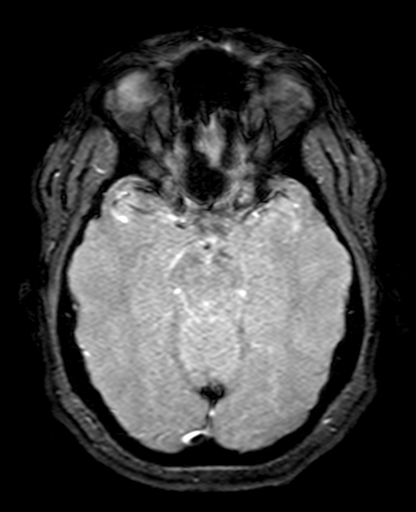
[im 34/51]
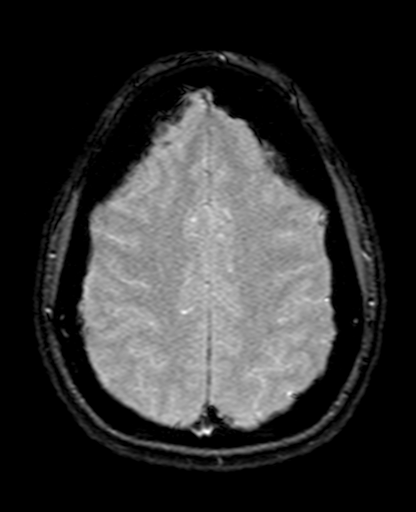
[im 51/51]
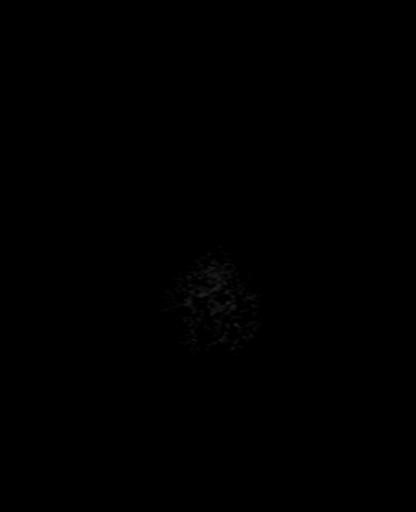

[Series 10: FLAIR · axial · 3.0mm · 0.90mm/px · z∈[+0,+145]mm · 4 of 51 slices shown]
[im 1/51]
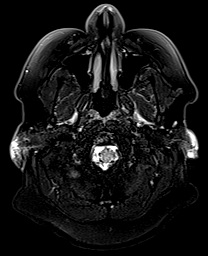
[im 17/51]
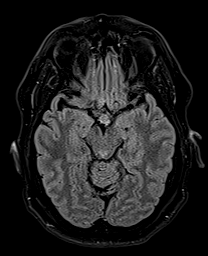
[im 34/51]
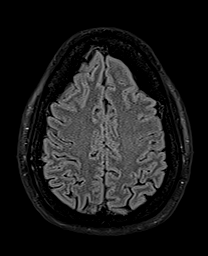
[im 51/51]
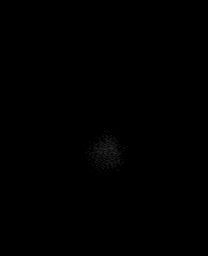

[Series 11: T1 · axial · 3.0mm · 0.45mm/px · z∈[-1,+144]mm · 4 of 51 slices shown (1 of 2)]
[im 1/51]
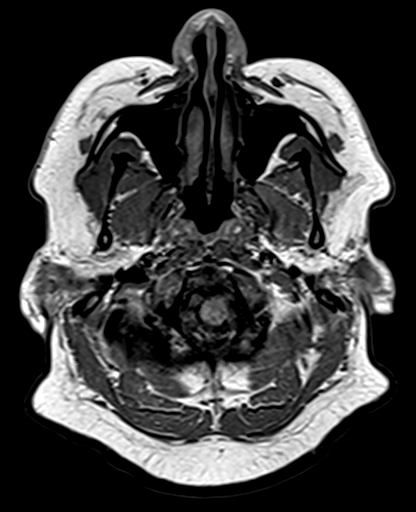
[im 17/51]
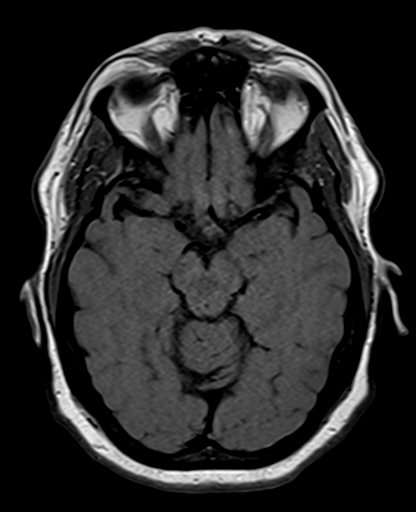
[im 34/51]
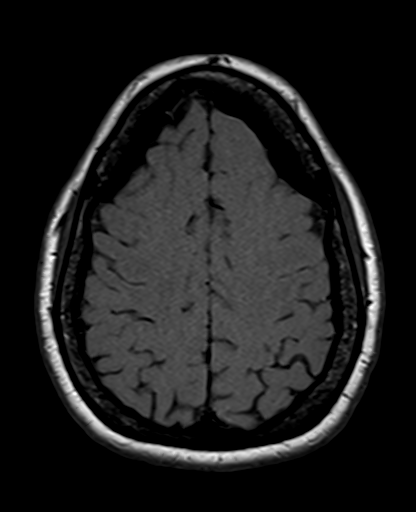
[im 51/51]
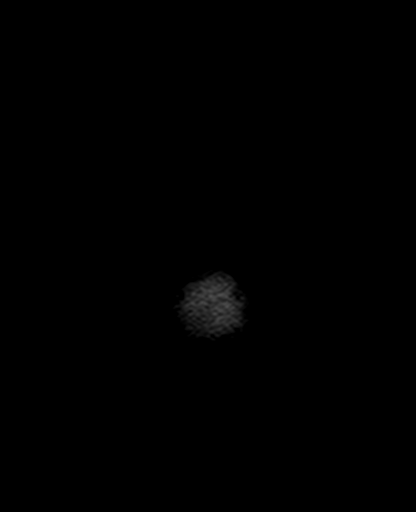

[Series 12: DWI · coronal · 5.0mm · 1.31mm/px · 5 of 64 slices shown (1 of 2)]
[im 1/64]
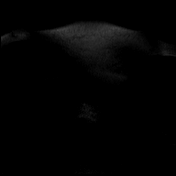
[im 16/64]
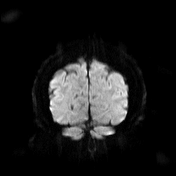
[im 32/64]
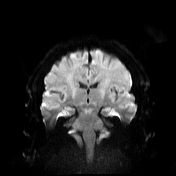
[im 48/64]
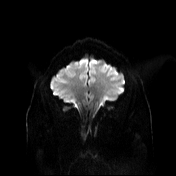
[im 64/64]
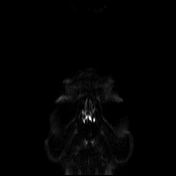

[Series 13: DWI · coronal · 5.0mm · 1.31mm/px · 2 of 31 slices shown (2 of 2)]
[im 1/31]
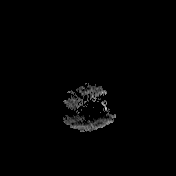
[im 31/31]
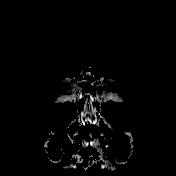

[Series 14: T2 post-contrast · coronal · 5.0mm · 0.86mm/px · 2 of 32 slices shown]
[im 1/32]
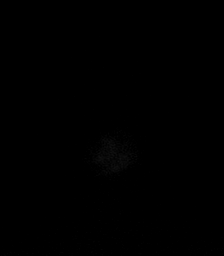
[im 32/32]
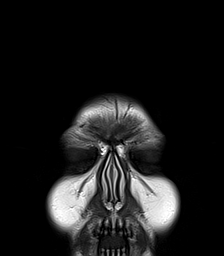

[Series 15: T1 · axial · 1.0mm · 0.94mm/px · z∈[+3,+141]mm · 11 of 144 slices shown (2 of 2)]
[im 1/144]
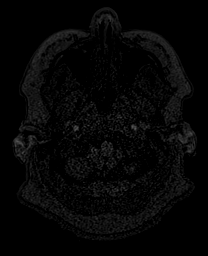
[im 15/144]
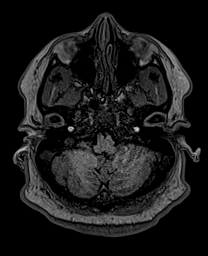
[im 29/144]
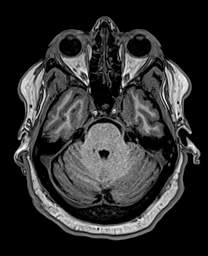
[im 43/144]
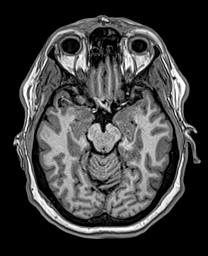
[im 58/144]
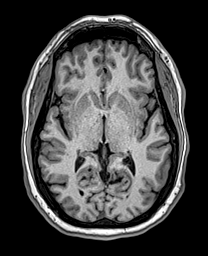
[im 72/144]
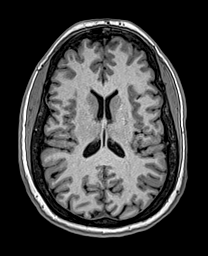
[im 86/144]
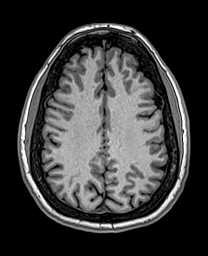
[im 101/144]
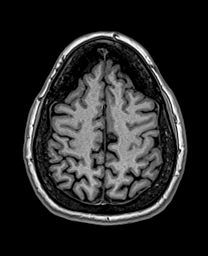
[im 115/144]
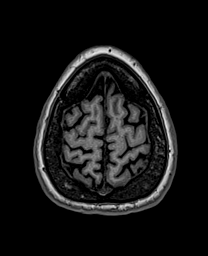
[im 129/144]
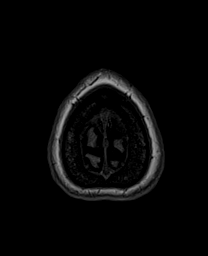
[im 144/144]
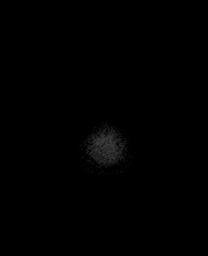

[48 of 48 positions shown; findings below may reference images not displayed]

FINDINGS: Brain: No restricted diffusion to suggest acute or subacute infarct.
No acute hemorrhage, mass, mass effect, or midline shift. No foci of
hemosiderin deposition to suggest remote hemorrhage. No
hydrocephalus or extra-axial collection.

Vascular: Normal flow voids.

Skull and upper cervical spine: Normal marrow signal.

Sinuses/Orbits: Negative.

Other: The mastoids are well aerated.
IMPRESSION: No acute intracranial process.

## 2021-07-08 IMAGING — CT CT ABD-PELV W/ CM
2 of 5 series · 15 of 46 positions shown, 17 images · IV contrast (agent unspecified)
Comparison: CT abdomen and pelvis [DATE]

CLINICAL DATA: Right lower quadrant pain. Known 18 mm stone.
Positive Rovsing's sign.

EXAM:
CT ABDOMEN AND PELVIS WITH CONTRAST
TECHNIQUE: Multidetector CT imaging of the abdomen and pelvis was performed
using the standard protocol following bolus administration of
intravenous contrast.

[Series 2: axial st · axial · 0.93mm/px · z∈[-574,-79]mm · 12 of 119 slices shown, 14 images]
[im 10/119  soft-tissue]
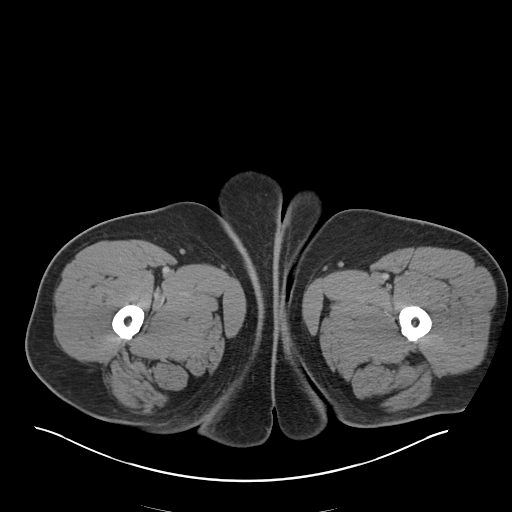
[im 10/119  bone]
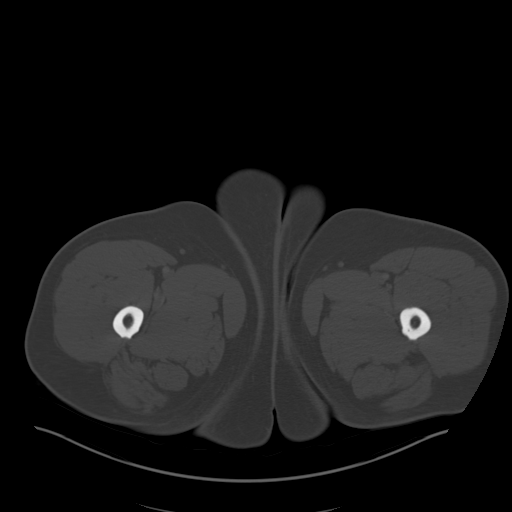
[im 19/119  soft-tissue]
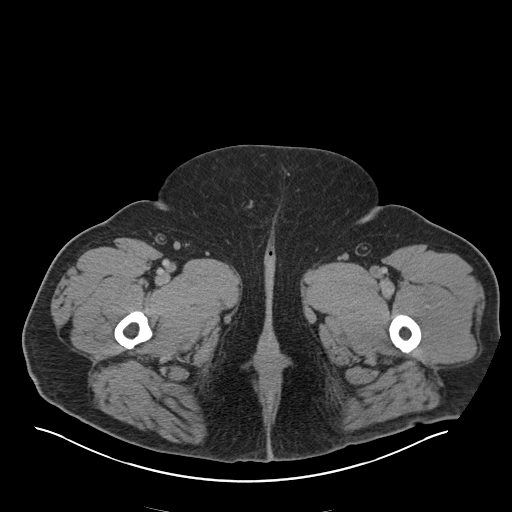
[im 28/119  soft-tissue]
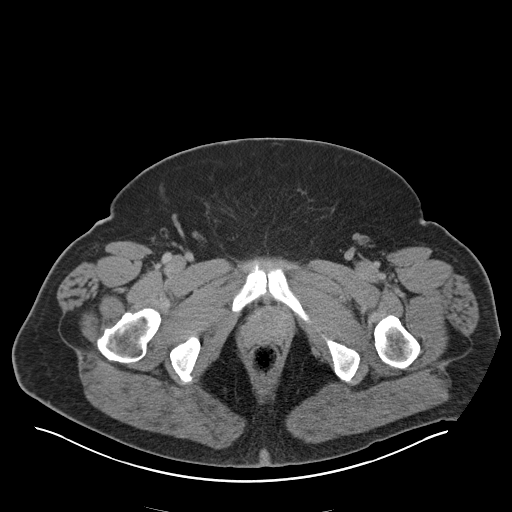
[im 37/119  soft-tissue]
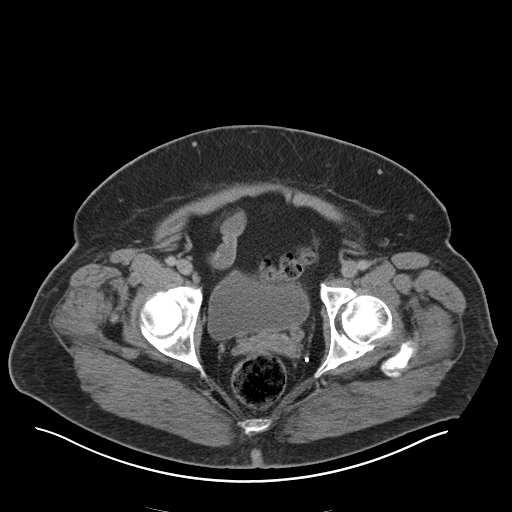
[im 46/119  soft-tissue]
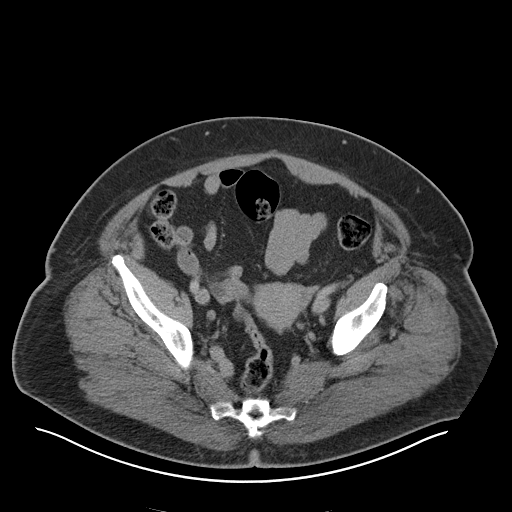
[im 55/119  soft-tissue]
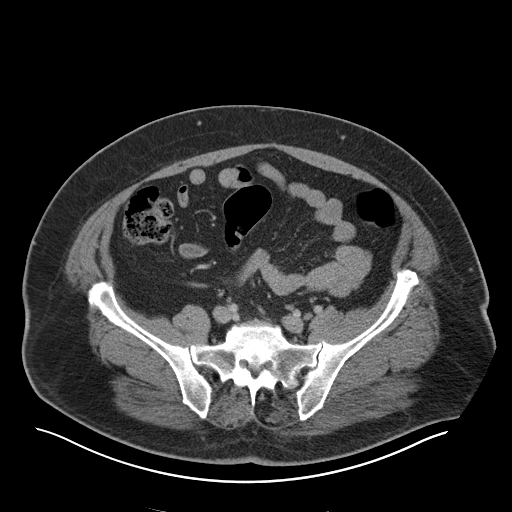
[im 64/119  soft-tissue]
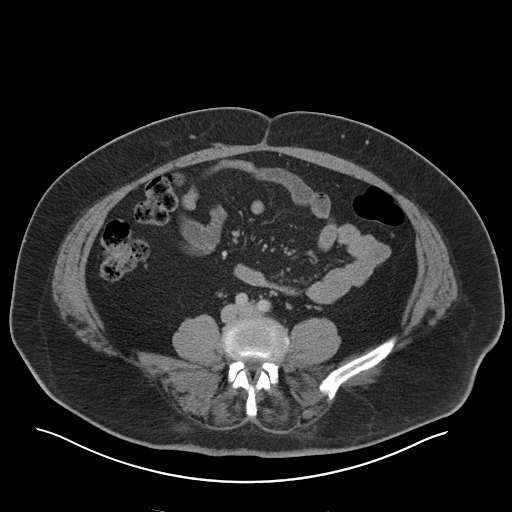
[im 73/119  soft-tissue]
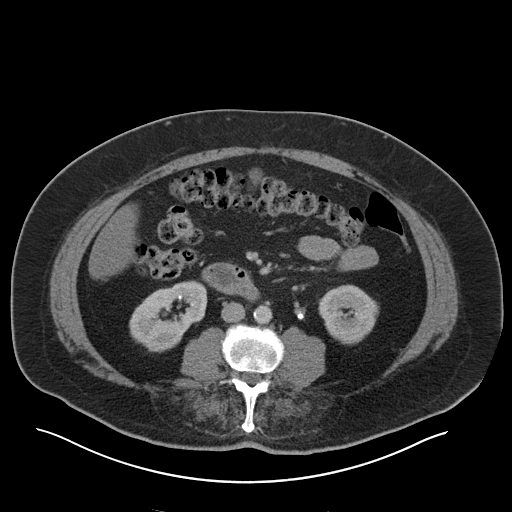
[im 82/119  soft-tissue]
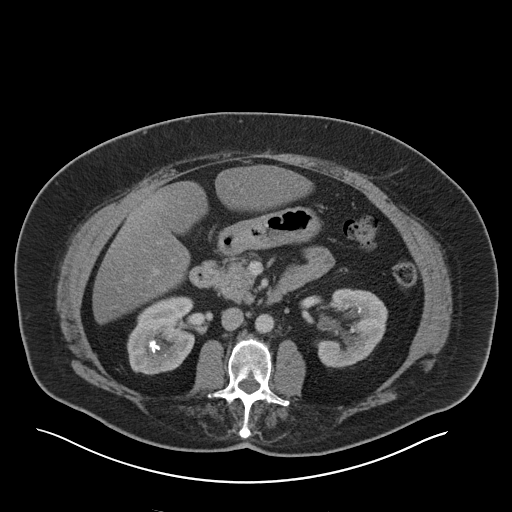
[im 82/119  bone]
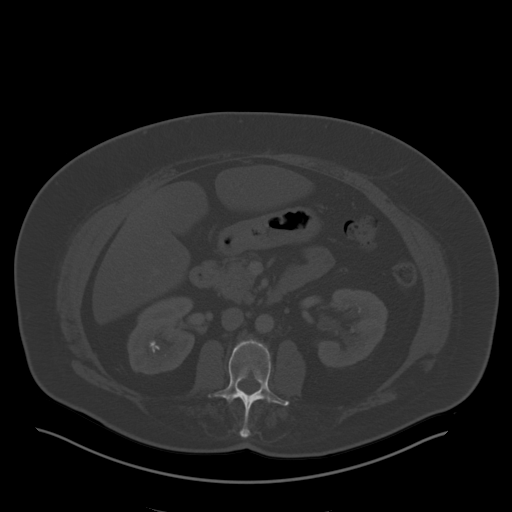
[im 91/119  soft-tissue]
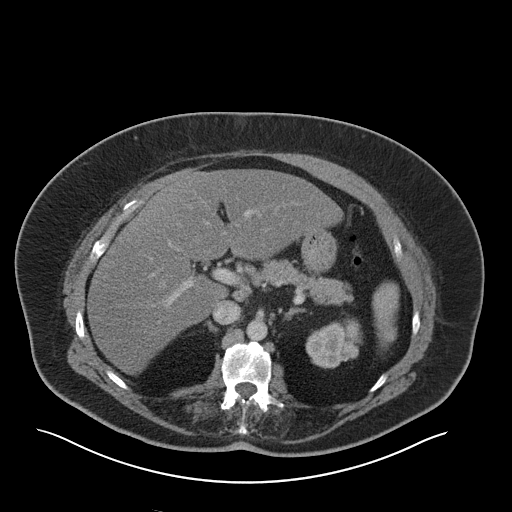
[im 100/119  soft-tissue]
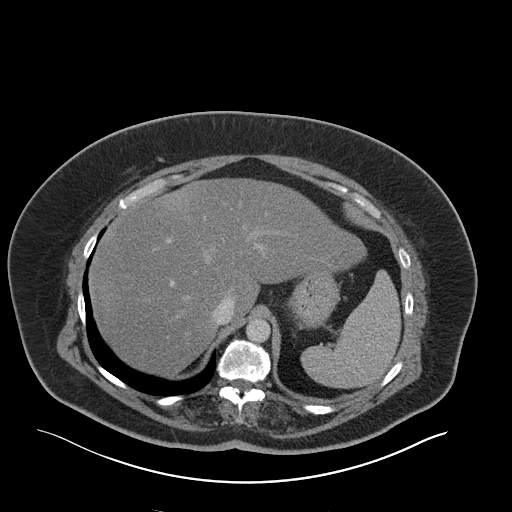
[im 109/119  soft-tissue]
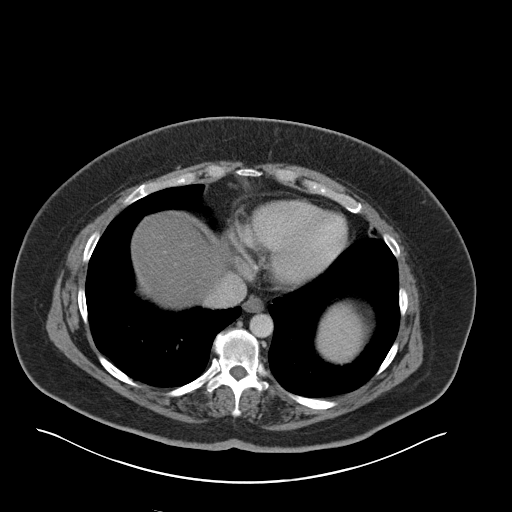

[Series 4: coronal st · coronal · 0.99mm/px · 3 of 160 slices shown]
[im 54/160  soft-tissue]
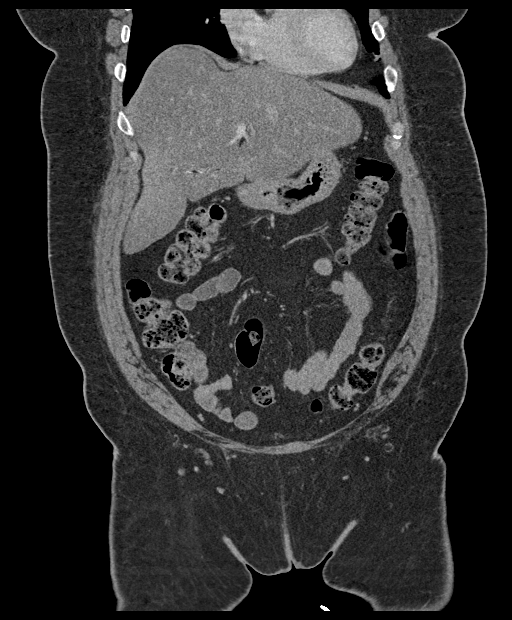
[im 71/160  soft-tissue]
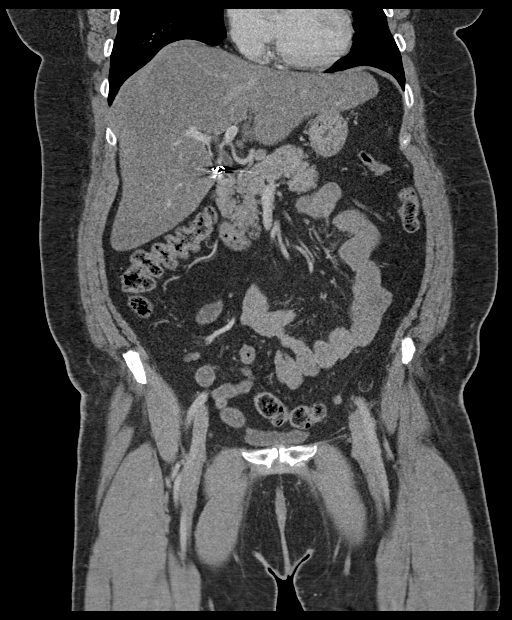
[im 89/160  soft-tissue]
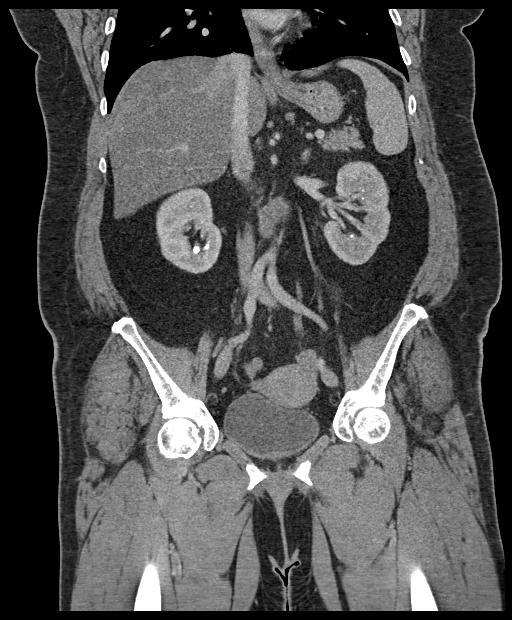

[15 of 46 positions shown; findings below may reference images not displayed]

RADIATION DOSE REDUCTION: This exam was performed according to the
departmental dose-optimization program which includes automated
exposure control, adjustment of the mA and/or kV according to
patient size and/or use of iterative reconstruction technique.

CONTRAST:  100mL OMNIPAQUE IOHEXOL 300 MG/ML  SOLN
FINDINGS: Lower chest: Mild curvilinear subsegmental atelectasis and/or
scarring within the lingula and left lower lobe greater than the
middle lobe, similar to prior [DATE] CT.

Hepatobiliary: Smooth liver contours. Diffuse decreased density is
seen throughout the liver suggesting fatty infiltration. No focal
liver lesion is seen. Status post cholecystectomy. No intrahepatic
or extrahepatic biliary ductal dilatation.

Pancreas: No mass or inflammatory fat stranding. No pancreatic
ductal dilatation is seen.

Spleen: A small splenule is again seen just inferior to the spleen.

Adrenals/Urinary Tract: Adrenal glands are unremarkable. The kidneys
enhance uniformly and are symmetric in size without hydronephrosis.
19 mm right renal midpole stone is not significantly changed from
[DATE]. Left upper pole 3 mm stone (axial image 31. Left lower
pole 4 mm stone (axial image 46). Additional smaller punctate left
renal stones. There is again a stone measuring up to 5 mm in
transverse dimension and 7 mm in craniocaudal dimension within the
posterior aspect of the left ureteropelvic junction with mild
upstream pelvicaliectasis, likely partially obstructing. No new
stone is seen within the left ureter. No right hydronephrosis. No
focal urinary bladder wall thickening.

Stomach/Bowel: Moderate stool is seen throughout the colon. No focal
bowel wall thickening is seen the terminal ileum is unremarkable.
The appendix is normal in appearance (axial series 2 images 66
through 69). No dilated loops of bowel to indicate bowel
obstruction.

Vascular/Lymphatic: No abdominal aortic aneurysm.

Reproductive: The uterus is present and unremarkable. No adnexal
mass is seen.

Other: Small fat containing umbilical hernia.No abdominopelvic
ascites. No pneumoperitoneum.

Musculoskeletal: Mild multilevel degenerative disc changes of the
visualized thoracolumbar spine.
IMPRESSION: :
IMPRESSION: 1. Normal appendix.
2. Redemonstration of bilateral renal stones measuring up to 19 mm
on the right and 4 mm on the left. There is again a stone within the
posterior aspect of the left ureteropelvic junction dimension,
measuring up to 5 mm in transverse dimension and 7 mm in
craniocaudal appearing to be partially obstructing, with mild
upstream pelvocaliectasis. This is unchanged from prior.
3.  Moderate stool throughout the colon.
4. Fatty liver.

## 2021-07-08 IMAGING — MR MR LUMBAR SPINE W/O CM
5 series · 31 of 48 positions shown · non-contrast
Comparison: No prior MRI, correlation is made with same day CT
abdomen pelvis and CT abdomen pelvis [DATE]

CLINICAL DATA: Low back pain, cauda equina syndrome suspected

EXAM:
MRI LUMBAR SPINE WITHOUT CONTRAST
TECHNIQUE: Multiplanar, multisequence MR imaging of the lumbar spine was
performed. No intravenous contrast was administered.

[Series 5: T1 · sagittal · 4.0mm · 0.81mm/px · 6 of 17 slices shown (1 of 2)]
[im 1/17]
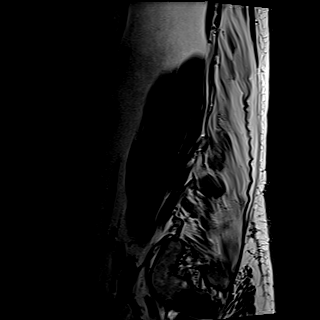
[im 4/17]
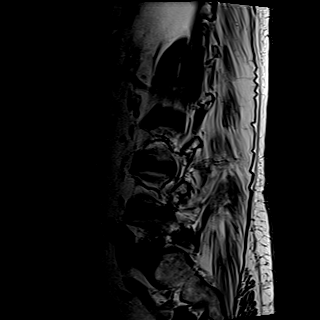
[im 7/17]
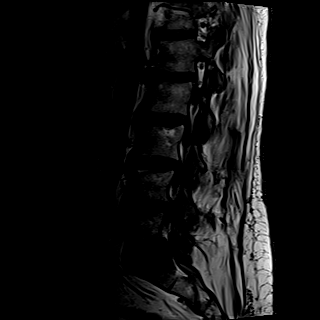
[im 10/17]
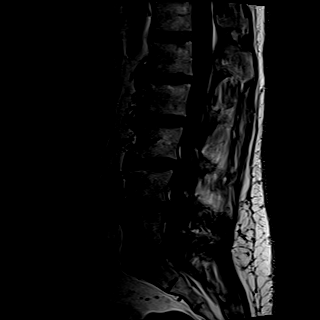
[im 13/17]
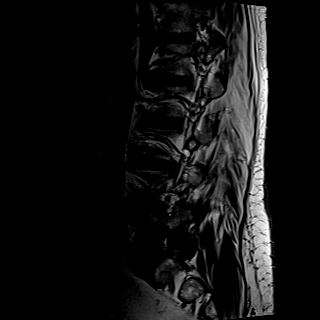
[im 17/17]
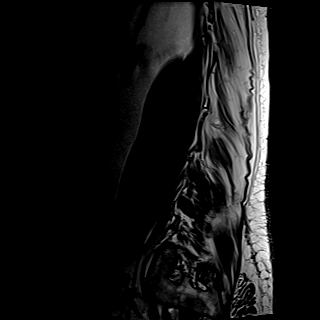

[Series 6: T2 · sagittal · 4.0mm · 1.02mm/px · 6 of 17 slices shown (1 of 2)]
[im 1/17]
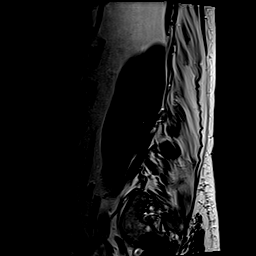
[im 4/17]
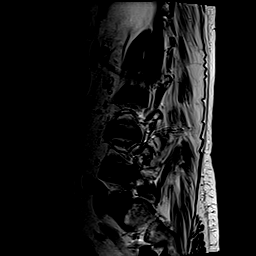
[im 7/17]
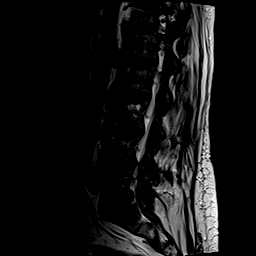
[im 10/17]
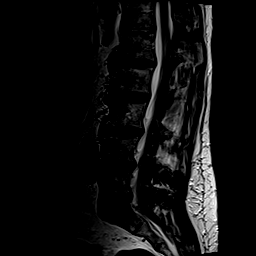
[im 13/17]
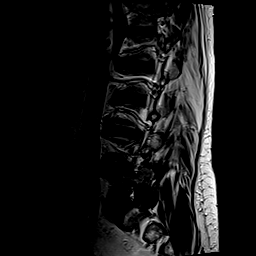
[im 17/17]
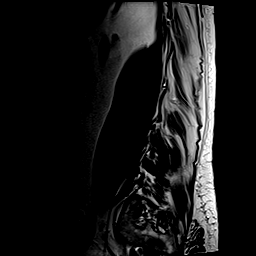

[Series 7: STIR · sagittal · 4.0mm · 0.51mm/px · 1 of 17 slices shown]
[im 1/17]
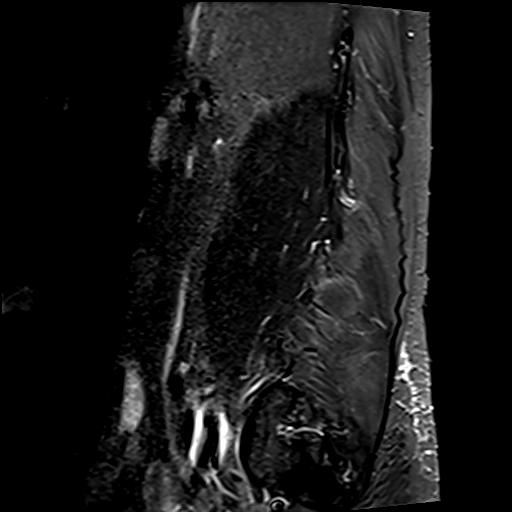

[Series 8: T2 · axial · 4.0mm · 0.62mm/px · z∈[+38,+272]mm · 9 of 43 slices shown (2 of 2)]
[im 1/43]
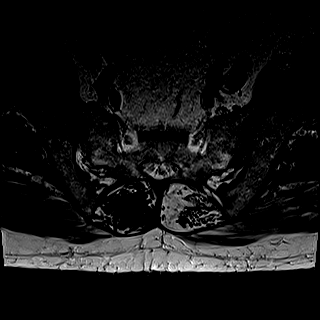
[im 7/43]
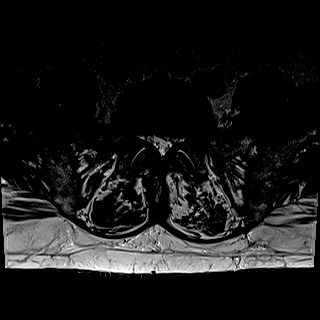
[im 13/43]
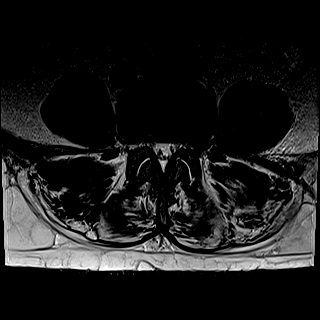
[im 19/43]
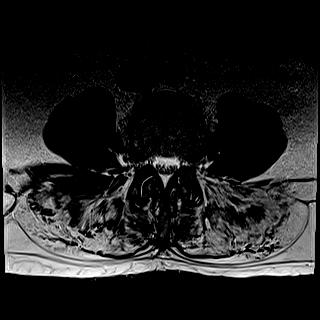
[im 22/43]
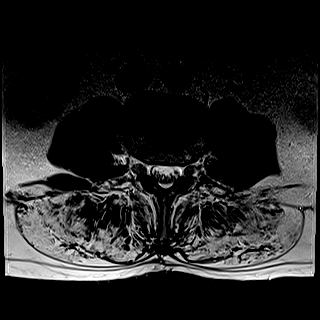
[im 25/43]
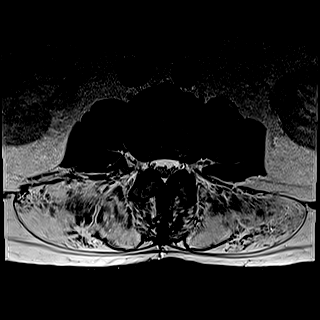
[im 31/43]
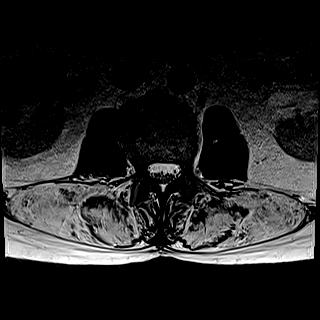
[im 37/43]
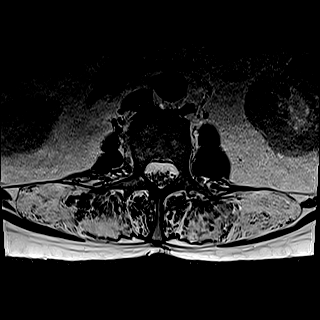
[im 43/43]
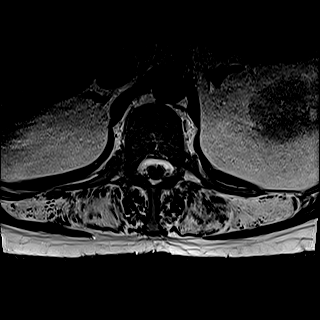

[Series 9: T1 · axial · 4.0mm · 0.39mm/px · z∈[+38,+272]mm · 9 of 43 slices shown (2 of 2)]
[im 1/43]
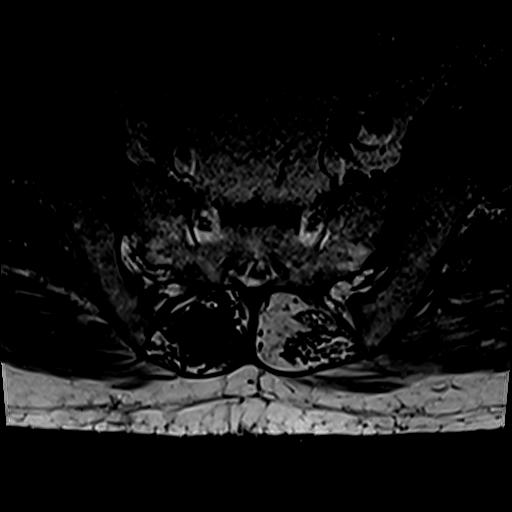
[im 7/43]
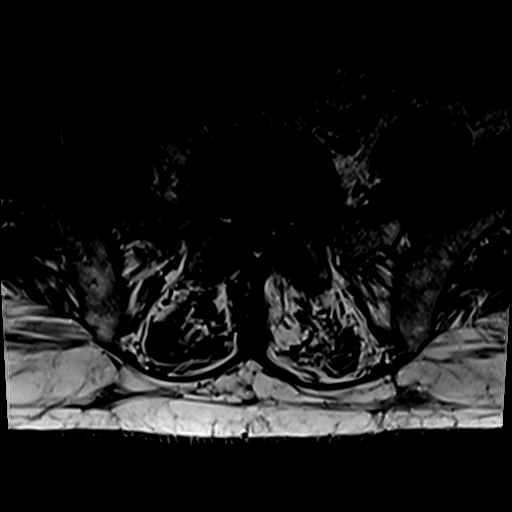
[im 13/43]
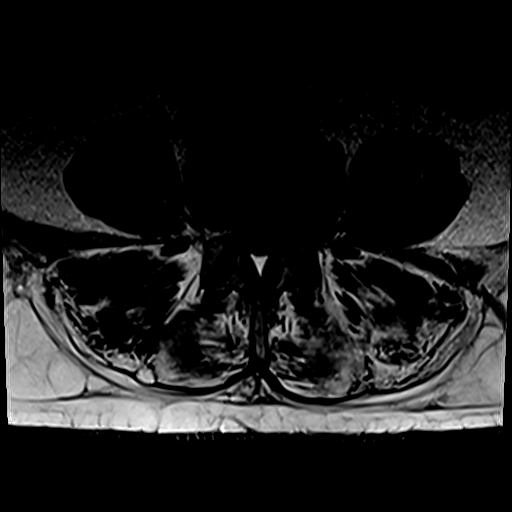
[im 19/43]
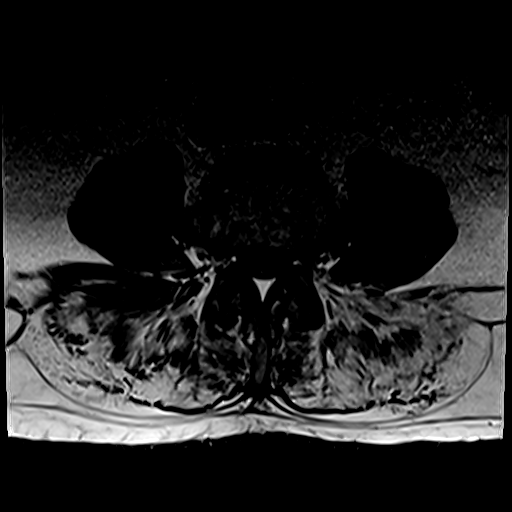
[im 22/43]
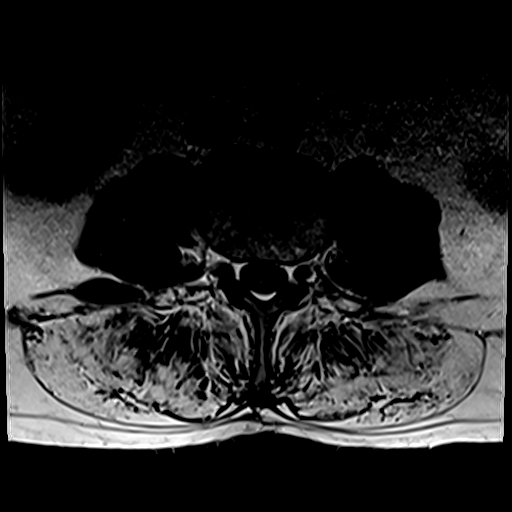
[im 25/43]
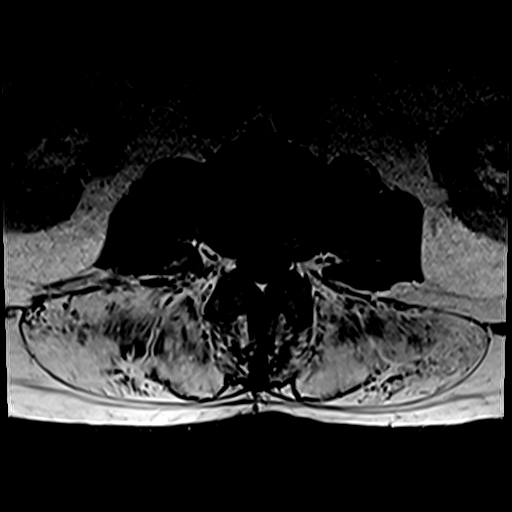
[im 31/43]
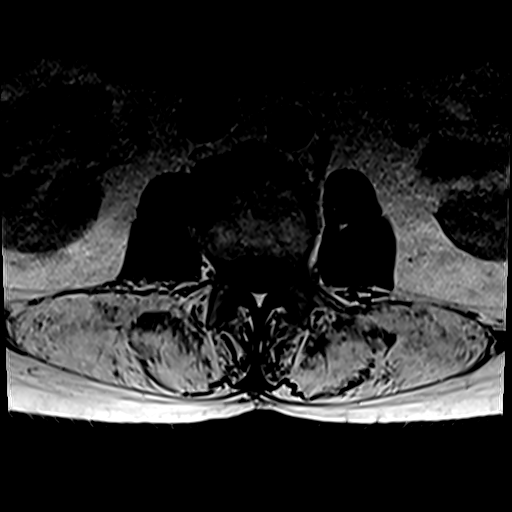
[im 37/43]
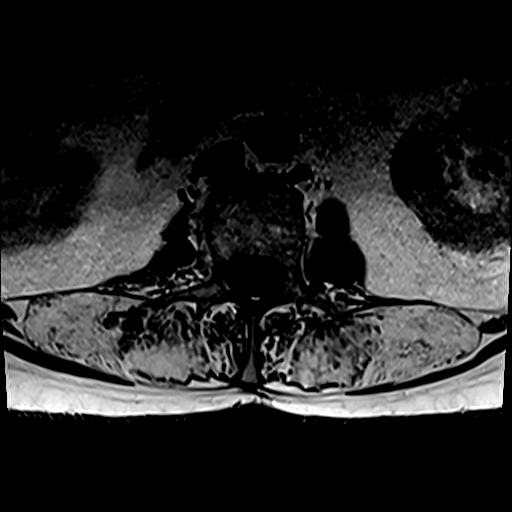
[im 43/43]
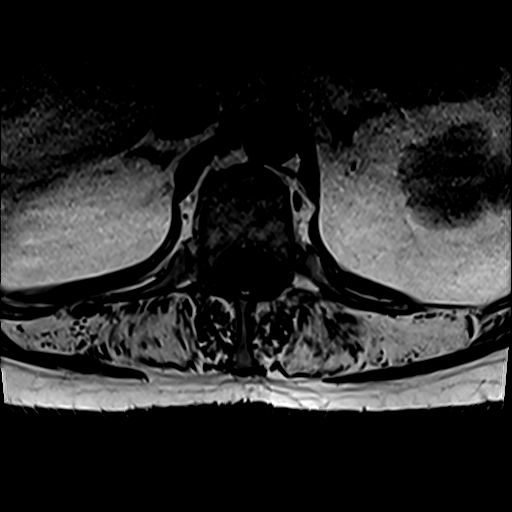

[31 of 48 positions shown; findings below may reference images not displayed]

FINDINGS: Segmentation:  Standard.

Alignment: S shaped curvature of the thoracolumbar spine, with mild
levocurvature of the lumbar spine. Straightening of the normal
cervical lordosis. 5 mm retrolisthesis L5 on S1, which appears
unchanged compared to the [SB] prior CT.

Vertebrae:  No acute fracture or suspicious osseous abnormality.

Conus medullaris and cauda equina: Conus extends to the L1 level.
Conus and cauda equina appear normal.

Paraspinal and other soft tissues: Fatty atrophy of the majority of
the imaged paraspinous muscles, left-greater-than-right. Normal
iliopsoas muscle bulk

Disc levels:

T12-L1: No significant disc bulge. Mild facet arthropathy. No spinal
canal stenosis or neural foraminal narrowing.

L1-L2: Disc desiccation and mild disc bulge. Mild facet arthropathy.
No spinal canal stenosis or neural foraminal narrowing.

L2-L3: Minimal disc bulge. Mild facet arthropathy. No spinal canal
stenosis or neural foraminal narrowing.

L3-L4: Mild disc bulge. Moderate facet arthropathy. No spinal canal
stenosis. Mild bilateral neural foraminal narrowing.

L4-L5: Mild disc bulge. Moderate facet arthropathy. Narrowing of the
bilateral lateral recesses. No spinal canal stenosis. Moderate
left-greater-than-right neural foraminal narrowing.

L5-S1: Retrolisthesis with mild disc bulge with central annular
fissure. Mild-to-moderate facet arthropathy. Narrowing of the
lateral recesses. No spinal canal stenosis. Severe left and moderate
to severe right neural foraminal narrowing. Mild enlargement of the
left extraforaminal L5 nerve, with increased signal on T2 weighted
sequences (series 8, image 43), compared to the right L5 nerve.
IMPRESSION: 1. No evidence of cauda equina syndrome. No significant spinal canal
stenosis in the lumbar spine.
2. L5-S1 severe left and moderate to severe right neural foraminal
narrowing. Narrowing of the lateral recesses at this level could
affect the descending S1 nerve roots. In addition, mild enlargement
of the extraforaminal left L5 nerve, with increased T2 hyperintense
signal may indicate compressive myelopathy.
3. L4-L5 moderate left-greater-than-right neural foraminal
narrowing. Narrowing of the lateral recesses at this level could
affect the descending L5 nerve roots.
4. L3-L4 mild bilateral neural foraminal narrowing.

## 2021-07-08 IMAGING — US US PELVIS COMPLETE TRANSABD/TRANSVAG W DUPLEX AND/OR DOPPLER
1 series · 15 of 25 positions shown · non-contrast
Comparison: [DATE].

CLINICAL DATA: Pelvic pain for 2 days.

EXAM:
TRANSABDOMINAL AND TRANSVAGINAL ULTRASOUND OF PELVIS
DOPPLER ULTRASOUND OF OVARIES
TECHNIQUE: Both transabdominal and transvaginal ultrasound examinations of the
pelvis were performed. Transabdominal technique was performed for
global imaging of the pelvis including uterus, ovaries, adnexal
regions, and pelvic cul-de-sac.
It was necessary to proceed with endovaginal exam following the
transabdominal exam to visualize the anatomy. Color and duplex
Doppler ultrasound was utilized to evaluate blood flow to the
ovaries.

[Series 1: us transvaginal non-ob mc & wl · 73 acquisitions, 15 frames shown]
[im 1/73]
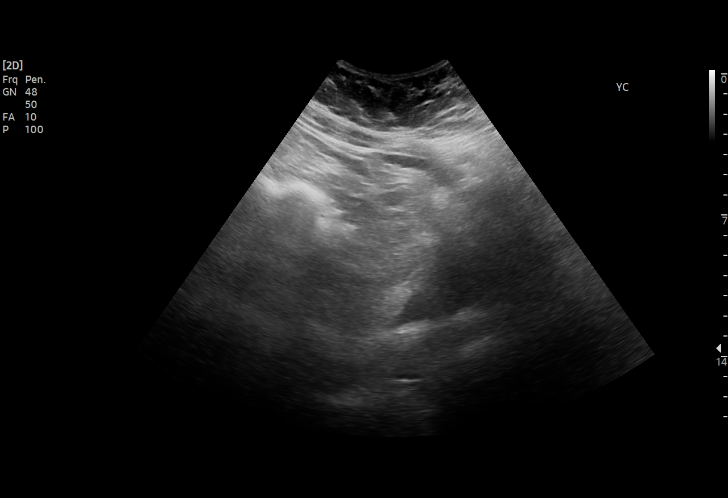
[im 7/73]
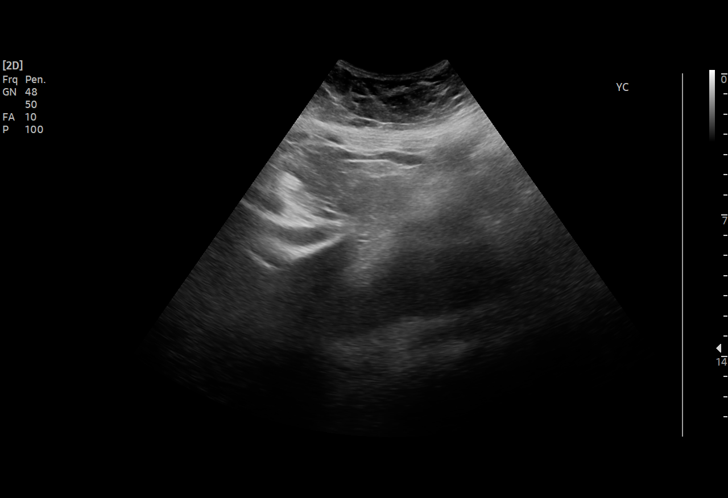
[im 13/73]
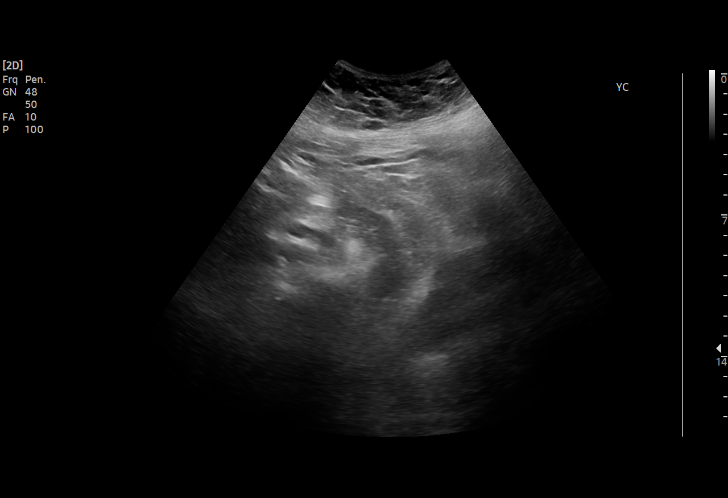
[im 16/73]
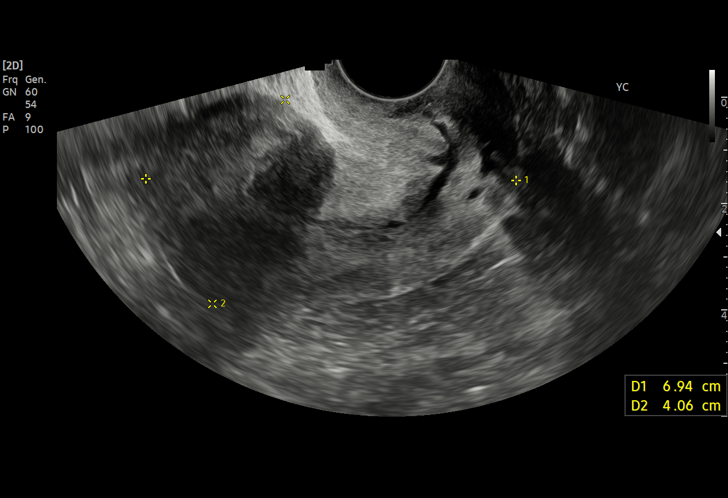
[im 22/73]
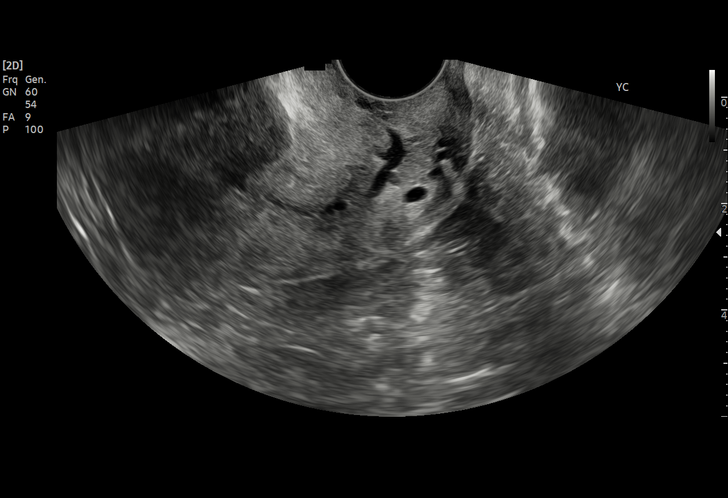
[im 28/73]
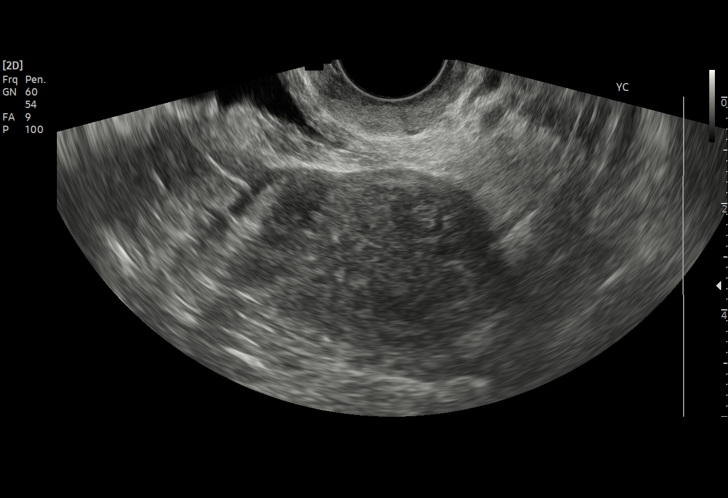
[im 31/73]
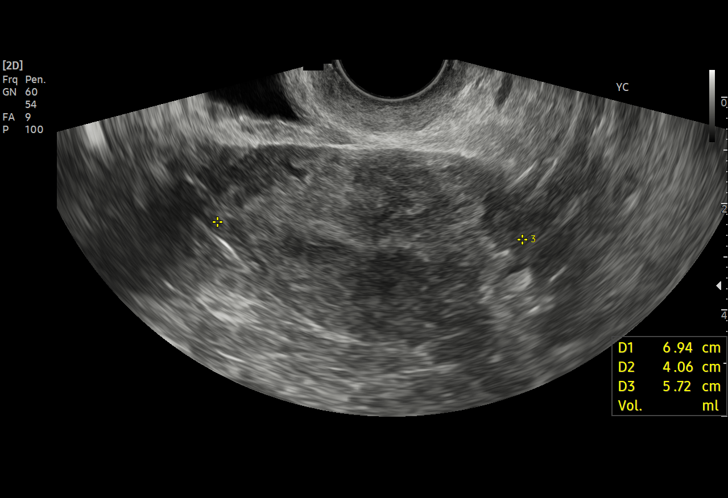
[im 37/73]
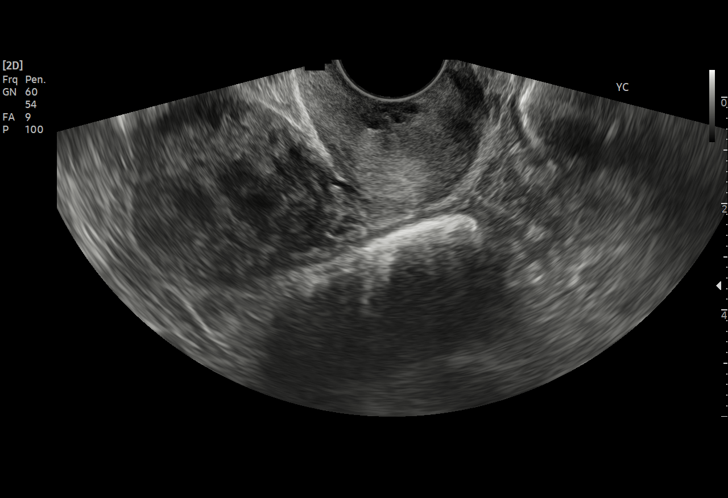
[im 43/73]
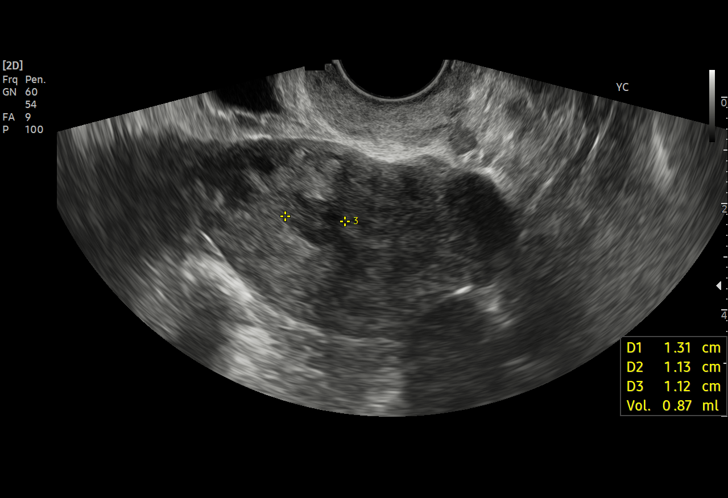
[im 46/73]
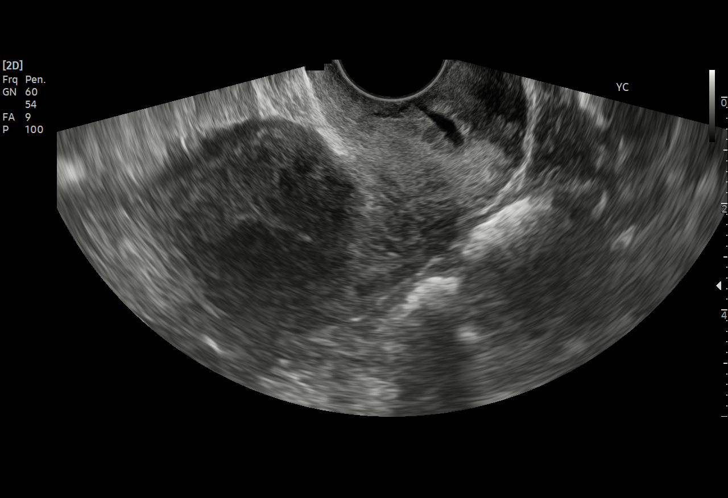
[im 52/73]
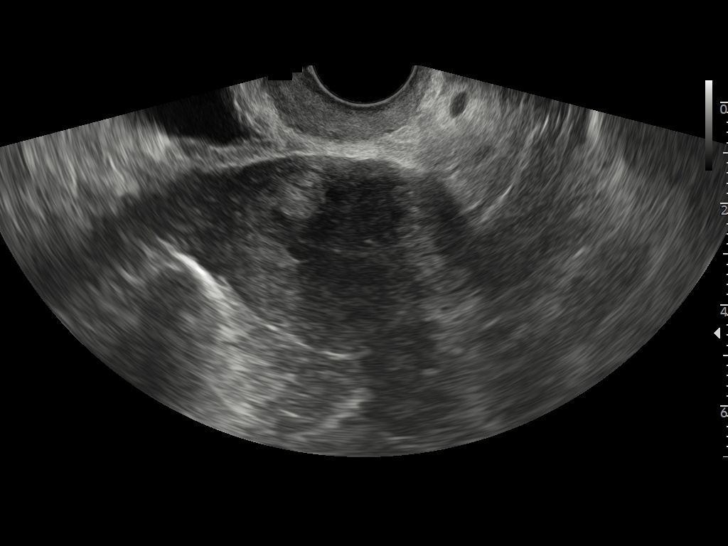
[im 58/73]
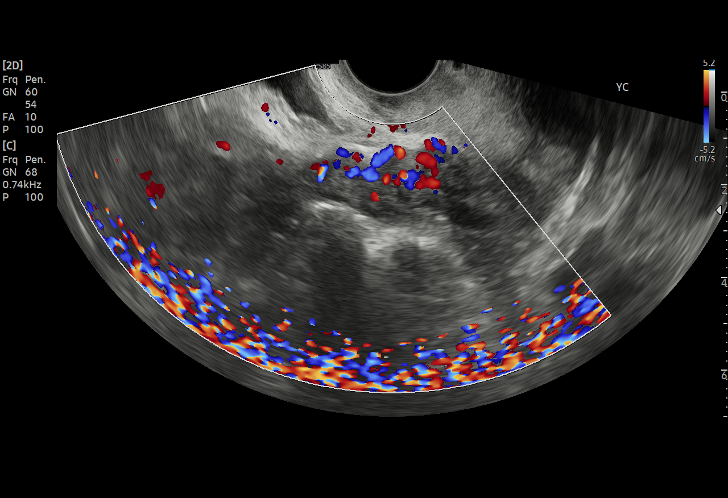
[im 61/73]
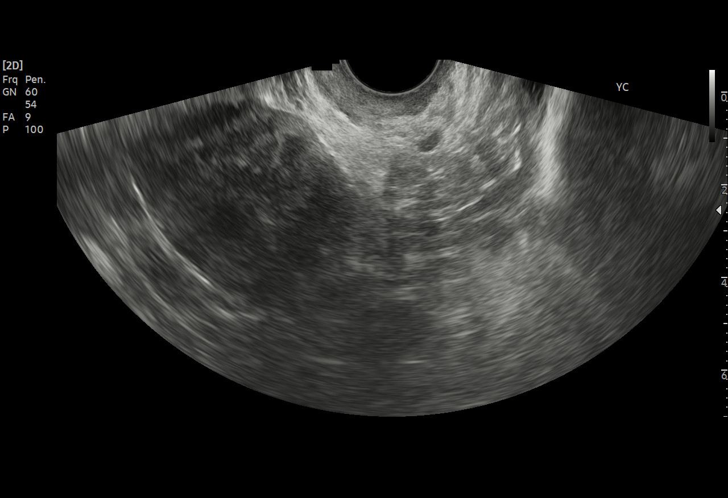
[im 67/73]
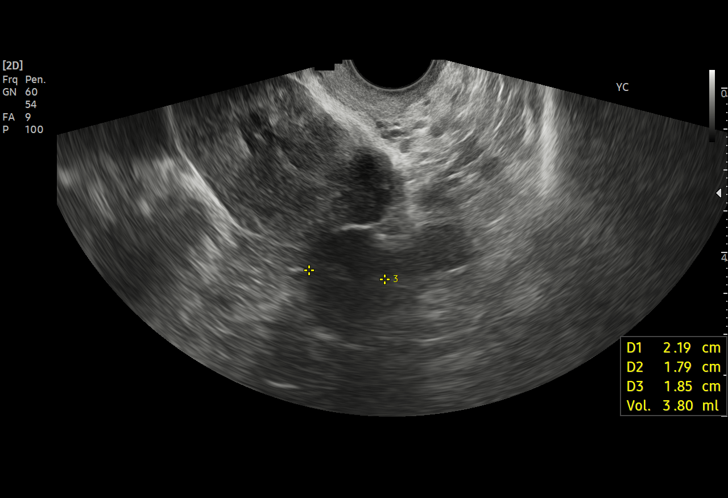
[im 73/73]
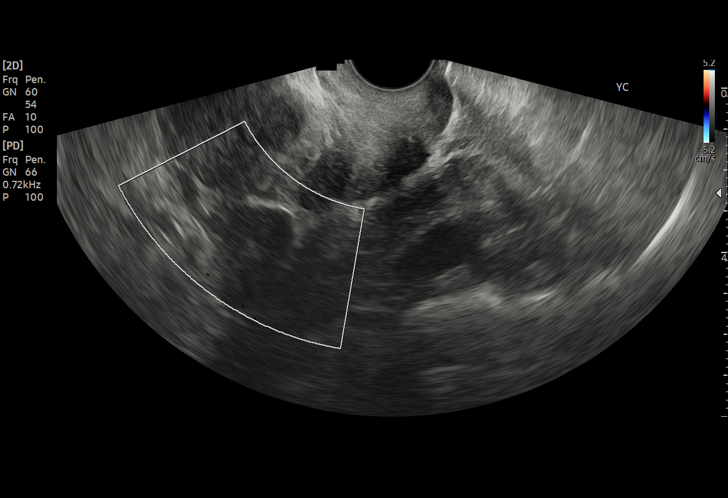

[15 of 25 positions shown; findings below may reference images not displayed]

FINDINGS: Uterus

Measurements: 6.9 x 4.1 x 5.7 cm = volume: 84.5 mL. Fibroids are
noted at the uterine fundus measuring 1.3 x 1.1 x 1.1 cm and 1.5 x
1.3 x 1.6 cm.

Endometrium

Thickness: 3.6 mm.  No focal abnormality visualized.

Right ovary

Not visualized on transabdominal or transvaginal imaging.

Left ovary

Measurements: 2.2 x 1.8 x 1.9 cm = volume: 1.8 mL. Not well seen due
to patient's body habitus

Pulsed Doppler evaluation of both ovaries is limited due to
nonvisualization of the right ovary and limited evaluation of the
left ovary due to patient's body habitus.

Other findings

No abnormal free fluid.
IMPRESSION: 1. Limited evaluation due to patient's body habitus. The right ovary
is not visualized on exam and there is suboptimal evaluation flow in
the left ovary. The need for repeat evaluation should be determined
clinically.
2. Uterine fibroids.

## 2021-07-08 MED ORDER — ONDANSETRON HCL 4 MG/2ML IJ SOLN
4.0000 mg | Freq: Once | INTRAMUSCULAR | Status: AC
Start: 2021-07-08 — End: 2021-07-08
  Administered 2021-07-08: 4 mg via INTRAVENOUS

## 2021-07-08 MED ORDER — SODIUM CHLORIDE 0.9 % IV SOLN
1.0000 g | Freq: Once | INTRAVENOUS | Status: AC
  Administered 2021-07-09: 1 g via INTRAVENOUS
  Filled 2021-07-08: qty 10

## 2021-07-08 MED ORDER — KETOROLAC TROMETHAMINE 15 MG/ML IJ SOLN
15.0000 mg | Freq: Once | INTRAMUSCULAR | Status: AC
  Administered 2021-07-08: 15 mg via INTRAVENOUS
  Filled 2021-07-08: qty 1

## 2021-07-08 MED ORDER — LACTATED RINGERS IV BOLUS
1000.0000 mL | Freq: Once | INTRAVENOUS | Status: AC
Start: 2021-07-08 — End: 2021-07-08
  Administered 2021-07-08: 1000 mL via INTRAVENOUS

## 2021-07-08 MED ORDER — ONDANSETRON HCL 4 MG/2ML IJ SOLN
INTRAMUSCULAR | Status: AC
  Filled 2021-07-08: qty 2

## 2021-07-08 MED ORDER — MORPHINE SULFATE (PF) 4 MG/ML IV SOLN
4.0000 mg | Freq: Once | INTRAVENOUS | Status: AC
  Administered 2021-07-08: 4 mg via INTRAVENOUS
  Filled 2021-07-08: qty 1

## 2021-07-08 MED ORDER — IOHEXOL 300 MG/ML  SOLN
100.0000 mL | Freq: Once | INTRAMUSCULAR | Status: AC | PRN
  Administered 2021-07-08: 100 mL via INTRAVENOUS

## 2021-07-08 NOTE — Progress Notes (Signed)
Established Patient Office Visit  Subjective:  Patient ID: Michele Hall. [address], female    DOB: 03-12-1966  Age: 56 y.o. MRN: [id number]  CC:  Chief Complaint  Patient presents with   Groin Pain    HPI Michele Hall is here today with concerns.   Right groin pain that seems to be worsening in severity.  Notices that walking or moving around she feels weakness in her right leg and it will occasionally give out on her. Sunday (three days ago) in her right groin she started feeling terrible pain making it hard for her to even sleep. She had trouble lying down and was very uncomfortable. Took one advil with no real relief. Yesterday am took four advils with no relief either. This am when she woke up she was hopeful it would be better, and again took four advil, with no improvement. She states its actually worse today and actually feeling soreness in her right lateral thigh. She denies any known trauma or injury. The pain is pretty moderate to severe when it occurs, can be interrmittent, when it comes sharp 10/10 pain.   She states sometimes she feels like something is 'bulging' but she can not palpate any mass.  She is concerned because she really is trouble with her right leg, hard to even lift up at times. Bending to pick up leg aggravates her right groin as well.   No vaginal discharge, no urinary frequency,  not having trouble peeing. Does state she has a surgery pending for an 18 mm left kidney stone, going to have a stent placed on 07/24/21. She does see urology Dr. Diona Fanti.   Prior to leaving pt also stated she has not had a bowel movement in the last 3-4 days which is abnormal for her.  Past Medical History:  Diagnosis Date   Anal fissure - posterior 2015   Anemia    Gallstones    GERD (gastroesophageal reflux disease)    History of kidney stones    Hypertension    IBS    Morbid obesity (HCC)     Past Surgical History:  Procedure Laterality Date   CHOLECYSTECTOMY      CYSTOSCOPY WITH RETROGRADE PYELOGRAM, URETEROSCOPY AND STENT PLACEMENT Left 04/18/2020   Procedure: CYSTOSCOPY WITH RETROGRADE PYELOGRAM, URETEROSCOPY , STONE EXTRACTION AND STENT PLACEMENT;  Surgeon: Franchot Gallo, MD;  Location: [facility];  Service: Urology;  Laterality: Left;  75 MINS   DILITATION & CURRETTAGE/HYSTROSCOPY WITH HYDROTHERMAL ABLATION N/A 09/13/2012   Procedure: DILATATION & CURETTAGE/HYSTEROSCOPY WITH HYDROTHERMAL ABLATION;  Surgeon: Osborne Oman, MD;  Location: [facility];  Service: Gynecology;  Laterality: N/A;   HOLMIUM LASER APPLICATION Left 50/27/7412   Procedure: HOLMIUM LASER LITHOTRIPSY;  Surgeon: Franchot Gallo, MD;  Location: [facility];  Service: Urology;  Laterality: Left;   TUBAL LIGATION      Family History  Problem Relation Age of Onset   Stroke Mother    Kidney disease Father    Coronary artery disease Father    Aneurysm Father        AAA   Hypertension Father    Seizures Sister    Heart attack Maternal Grandfather    Cancer Sister        ovarian/uterus   Tongue cancer Sister    Colon cancer Neg Hx    Stomach cancer Neg Hx    Urticaria Neg Hx    Immunodeficiency Neg Hx    Eczema Neg Hx    Atopy Neg Hx  Asthma Neg Hx    Angioedema Neg Hx    Allergic rhinitis Neg Hx     Social History   Socioeconomic History   Marital status: Married    Spouse name: Not on file   Number of children: 2   Years of education: Not on file   Highest education level: Not on file  Occupational History   Occupation: house cleaner/ former bus driver    Employer: UNEMPLOYED  Tobacco Use   Smoking status: Former    Types: Cigarettes    Quit date: 02/09/2002    Years since quitting: 19.4   Smokeless tobacco: Never  Vaping Use   Vaping Use: Never used  Substance and Sexual Activity   Alcohol use: No   Drug use: No   Sexual activity: Yes    Partners: Male    Birth control/protection: Surgical  Other Topics Concern   Not on file  Social History Narrative    Married, 2 sons   Essentially no caffeine         Social Determinants of Health   Financial Resource Strain: Not on file  Food Insecurity: Not on file  Transportation Needs: Not on file  Physical Activity: Not on file  Stress: Not on file  Social Connections: Not on file  Intimate Partner Violence: Not on file    Outpatient Medications Prior to Visit  Medication Sig Dispense Refill   aspirin 81 MG chewable tablet Chew by mouth daily. Pt takes half of tablet     fexofenadine (ALLEGRA) 180 MG tablet TAKE 1 TABLET BY MOUTH EVERY DAY 90 tablet 0   Probiotic Product ([employer] ADV PROBIOTIC GUMMIES PO) Take 1 tablet by mouth in the morning.     spironolactone (ALDACTONE) 50 MG tablet TAKE 1 TABLET BY MOUTH EVERY DAY 90 tablet 3   No facility-administered medications prior to visit.    Allergies  Allergen Reactions   Requip [Ropinirole Hcl] Other (See Comments)    Headache, Feels "sick"    ROS Review of Systems  Constitutional:  Negative for chills and fever.  Cardiovascular:  Negative for chest pain and palpitations.  Gastrointestinal:  Positive for abdominal pain and constipation (hasn't had bowel movement in about four days). Negative for abdominal distention, diarrhea, nausea and vomiting.  Genitourinary:  Positive for decreased urine volume, dysuria, frequency and pelvic pain. Negative for difficulty urinating and urgency. Vaginal bleeding: pt states doesn't go that often but able to void. Musculoskeletal:  Positive for arthralgias.  Neurological:  Positive for weakness (right hip/ right leg).     Objective:    Physical Exam Constitutional:      General: She is in acute distress (in pain).     Appearance: Normal appearance. She is obese. She is not ill-appearing, toxic-appearing or diaphoretic.  HENT:     Head: Normocephalic.  Cardiovascular:     Rate and Rhythm: Normal rate and regular rhythm.  Pulmonary:     Effort: Pulmonary effort is normal.     Breath sounds:  Normal breath sounds.  Abdominal:     General: Bowel sounds are decreased.     Palpations: Abdomen is soft. There is no mass.     Tenderness: There is abdominal tenderness (severe tenderness rlq) in the right lower quadrant, suprapubic area and left lower quadrant. There is guarding and rebound (rlq). Positive signs include McBurney's sign and psoas sign.     Hernia: No hernia is present. There is no hernia in the right inguinal area.  Skin:  General: Skin is warm.  Neurological:     General: No focal deficit present.     Mental Status: She is alert and oriented to person, place, and time.  Psychiatric:        Mood and Affect: Mood normal.        Behavior: Behavior normal.        Thought Content: Thought content normal.        Judgment: Judgment normal.    BP 104/76    Pulse (!) 107    Temp (!) 97.3 F (36.3 C) (Temporal)    Ht _0  (1.676 m)    Wt 263 lb (119.3 kg)    SpO2 97%    BMI 42.45 kg/m  Wt Readings from Last 3 Encounters:  07/08/21 263 lb (119.3 kg)  05/15/21 263 lb 8 oz (119.5 kg)  01/15/21 264 lb 6.4 oz (119.9 kg)     Health Maintenance Due  Topic Date Due   COVID-19 Vaccine (1) Never done    There are no preventive care reminders to display for this patient.  Lab Results  Component Value Date   TSH 2.590 01/15/2021   Lab Results  Component Value Date   WBC 5.9 01/15/2021   HGB 15.2 01/15/2021   HCT 45.6 01/15/2021   MCV 95 01/15/2021   PLT 259 01/15/2021   Lab Results  Component Value Date   NA 141 01/15/2021   K 4.4 01/15/2021   CO2 21 01/15/2021   GLUCOSE 79 01/15/2021   BUN 13 01/15/2021   CREATININE 0.80 01/15/2021   BILITOT 0.5 01/15/2021   ALKPHOS 63 01/15/2021   AST 30 01/15/2021   ALT 34 (H) 01/15/2021   PROT 7.3 01/15/2021   ALBUMIN 4.4 01/15/2021   CALCIUM 9.2 01/15/2021   ANIONGAP 12 04/10/2020   EGFR 88 01/15/2021   GFR 89.60 04/02/2020   No results found for: HGBA1C    Assessment & Plan:   Problem List Items Addressed  This Visit       Other   Abdominal pain, RLQ (right lower quadrant) - Primary    With very little palpation on patient exam to rlq abd pt was in severe pain-= concern for acute abdomen and sent patient to the emergency room.  Patient was stable upon leaving however in pain, pt states will go to [facility].  Would like to rule out appendicitis and or kidney stone obstruction (as she does have a right kidney stone that is 18 mm per her report.).        No orders of the defined types were placed in this encounter.   Follow-up: No follow-ups on file.    Eugenia Pancoast, FNP

## 2021-07-08 NOTE — ED Triage Notes (Signed)
Pt presents with c/o abdominal pain in the RLQ area radiating into her right groin area. Pt reports she is having surgery to remove multiple kidney stones on 2/16. Pt reports that over the last couple of days, the pain has gotten much worse. Pt was seen at her PCP today and was told to come here to rule out blood clots, appendicitis, or other serious complications.

## 2021-07-08 NOTE — ED Notes (Signed)
Patient transported to MRI 

## 2021-07-08 NOTE — Assessment & Plan Note (Addendum)
With very little palpation on patient exam to rlq abd pt was in severe pain-= concern for acute abdomen and sent patient to the emergency room.  Patient was stable upon leaving however in pain, pt states will go to [facility].  Would like to rule out appendicitis and or kidney stone obstruction (as she does have a right kidney stone that is 18 mm per her report.). did advise pt the importance of going straight to the ER as if anything were to rupture/and or be obstructed it could be life threatening.

## 2021-07-08 NOTE — ED Notes (Signed)
Pt still in mri at this time

## 2021-07-08 NOTE — ED Provider Notes (Signed)
[facility] DEPT Provider Note   CSN: [id number] Arrival date & time: 07/08/21  1357     History  Chief Complaint  Patient presents with   Abdominal Pain    Michele Hall is a 56 y.o. female.   Abdominal Pain  56 year old female with a history of known nephrolithiasis who presents to the emergency department with concern for right lower quadrant abdominal pain and right groin pain.  She is primarily concerned that she has a DVT.  She states that she is having surgery with urology to remove multiple kidney stones on 2/16.  Over the past couple of days, she endorses worsening abdominal pain in this area.  She endorses a heaviness in her leg without acute neurologic deficit.  On chart review, the patient was seen by her PCP.  Per PCP notes, "notices that walking move around she feels weakness in the right leg and will occasionally give out on her.  Sunday (3 days ago) in her right groin she started feeling terrible pain making it hard to even sleep.  She had trouble lying down was very uncomfortable."  She endorses sharp 10 out of 10 pain with associated bulging in her right groin region.  She denies any dysuria, urinary frequency.  She was sent to the emergency department to evaluate for possible appendicitis or other acute intra-abdominal emergency given her severe right lower quadrant tenderness to palpation on exam with her PCP.  On additional chart review, the patient has a known 18 mm stone on the right scheduled for [facility] with [facility] on 2/16. She denies any swelling unilaterally in her RLE. She denies any calf pain.  Home Medications Prior to Admission medications   Medication Sig Start Date End Date Taking? Authorizing Provider  cephALEXin (KEFLEX) 500 MG capsule Take 1 capsule (500 mg total) by mouth 3 (three) times daily for 7 days. 07/09/21 07/16/21 Yes Maudie Flakes, MD  aspirin 81 MG chewable tablet Chew by mouth daily. Pt takes half of  tablet    [provider]  fexofenadine (ALLEGRA) 180 MG tablet TAKE 1 TABLET BY MOUTH EVERY DAY 07/10/21   Bedsole, Amy E, MD  Probiotic Product ([employer] ADV PROBIOTIC GUMMIES PO) Take 1 tablet by mouth in the morning.    [provider]  spironolactone (ALDACTONE) 50 MG tablet TAKE 1 TABLET BY MOUTH EVERY DAY 05/30/21   Jinny Sanders, MD      Allergies    Requip [ropinirole hcl]    Review of Systems   Review of Systems  Gastrointestinal:  Positive for abdominal pain.  Genitourinary:  Positive for pelvic pain.  All other systems reviewed and are negative.  Physical Exam Updated Vital Signs BP 120/71 (BP Location: Right Arm)    Pulse 80    Temp 98.2 F (36.8 C) (Oral)    Resp 18    Ht 5\' 7"  (1.702 m)    Wt 103 kg    SpO2 98%    BMI 35.56 kg/m  Physical Exam Vitals and nursing note reviewed.  Constitutional:      General: She is not in acute distress.    Appearance: She is well-developed.  HENT:     Head: Normocephalic and atraumatic.  Eyes:     Conjunctiva/sclera: Conjunctivae normal.     Pupils: Pupils are equal, round, and reactive to light.  Cardiovascular:     Rate and Rhythm: Normal rate and regular rhythm.     Heart sounds: No murmur  heard. Pulmonary:     Effort: Pulmonary effort is normal. No respiratory distress.     Breath sounds: Normal breath sounds.  Abdominal:     General: There is no distension.     Palpations: Abdomen is soft.     Tenderness: There is abdominal tenderness in the right lower quadrant. There is no right CVA tenderness, left CVA tenderness or guarding.     Hernia: No hernia is present.  Musculoskeletal:        General: No swelling, deformity or signs of injury.     Cervical back: Neck supple.  Skin:    General: Skin is warm and dry.     Capillary Refill: Capillary refill takes less than 2 seconds.     Findings: No lesion or rash.  Neurological:     General: No focal deficit present.     Mental Status: She is alert and  oriented to person, place, and time.     Comments: MENTAL STATUS EXAM:    Orientation: Alert and oriented to person, place and time.  Memory: Cooperative, follows commands well.  Language: Speech is clear and language is normal.   CRANIAL NERVES:    CN 2 (Optic): Visual fields intact to confrontation.  CN 3,4,6 (EOM): Pupils equal and reactive to light. Full extraocular eye movement without nystagmus.  CN 5 (Trigeminal): Facial sensation is normal, no weakness of masticatory muscles.  CN 7 (Facial): No facial weakness or asymmetry.  CN 8 (Auditory): Auditory acuity grossly normal.  CN 9,10 (Glossophar): The uvula is midline, the palate elevates symmetrically.  CN 11 (spinal access): Normal sternocleidomastoid and trapezius strength.  CN 12 (Hypoglossal): The tongue is midline. No atrophy or fasciculations.Marland Kitchen   MOTOR:  Muscle Strength: 5/5/RUE, 5/5LUE, 4/5RLE, 5/5LLE.   COORDINATION:   Intact finger-to-nose, no tremor.   SENSATION:   Intact to light touch all four extremities.  Some focal weakness in the right lower extremity.  Unclear if this is pain related versus true weakness.    Psychiatric:        Mood and Affect: Mood normal.    ED Results / Procedures / Treatments   Labs (all labs ordered are listed, but only abnormal results are displayed) Labs Reviewed  URINE CULTURE - Abnormal; Notable for the following components:      Result Value   Culture MULTIPLE SPECIES PRESENT, SUGGEST RECOLLECTION (*)    All other components within normal limits  COMPREHENSIVE METABOLIC PANEL - Abnormal; Notable for the following components:   Calcium 8.8 (*)    All other components within normal limits  URINALYSIS, ROUTINE W REFLEX MICROSCOPIC - Abnormal; Notable for the following components:   Color, Urine STRAW (*)    Specific Gravity, Urine >1.046 (*)    Hgb urine dipstick SMALL (*)    Leukocytes,Ua LARGE (*)    WBC, UA >50 (*)    Bacteria, UA RARE (*)    All other components within  normal limits  CBC WITH DIFFERENTIAL/PLATELET - Abnormal; Notable for the following components:   Hemoglobin 15.3 (*)    All other components within normal limits  LIPASE, BLOOD    EKG None  Radiology MR BRAIN WO CONTRAST  Result Date: 07/09/2021 CLINICAL DATA:  Neuro deficit, stroke suspected EXAM: MRI HEAD WITHOUT CONTRAST TECHNIQUE: Multiplanar, multiecho pulse sequences of the brain and surrounding structures were obtained without intravenous contrast. COMPARISON:  None. FINDINGS: Brain: No restricted diffusion to suggest acute or subacute infarct. No acute hemorrhage, mass, mass effect, or  midline shift. No foci of hemosiderin deposition to suggest remote hemorrhage. No hydrocephalus or extra-axial collection. Vascular: Normal flow voids. Skull and upper cervical spine: Normal marrow signal. Sinuses/Orbits: Negative. Other: The mastoids are well aerated. IMPRESSION: No acute intracranial process. Electronically Signed   By: Merilyn Baba M.D.   On: 07/09/2021 00:42   MR LUMBAR SPINE WO CONTRAST  Result Date: 07/08/2021 CLINICAL DATA:  Low back pain, cauda equina syndrome suspected EXAM: MRI LUMBAR SPINE WITHOUT CONTRAST TECHNIQUE: Multiplanar, multisequence MR imaging of the lumbar spine was performed. No intravenous contrast was administered. COMPARISON:  No prior MRI, correlation is made with same day CT abdomen pelvis and CT abdomen pelvis 04/10/2020 FINDINGS: Segmentation:  Standard. Alignment: S shaped curvature of the thoracolumbar spine, with mild levocurvature of the lumbar spine. Straightening of the normal cervical lordosis. 5 mm retrolisthesis L5 on S1, which appears unchanged compared to the 2021 prior CT. Vertebrae:  No acute fracture or suspicious osseous abnormality. Conus medullaris and cauda equina: Conus extends to the L1 level. Conus and cauda equina appear normal. Paraspinal and other soft tissues: Fatty atrophy of the majority of the imaged paraspinous muscles,  left-greater-than-right. Normal iliopsoas muscle bulk Disc levels: T12-L1: No significant disc bulge. Mild facet arthropathy. No spinal canal stenosis or neural foraminal narrowing. L1-L2: Disc desiccation and mild disc bulge. Mild facet arthropathy. No spinal canal stenosis or neural foraminal narrowing. L2-L3: Minimal disc bulge. Mild facet arthropathy. No spinal canal stenosis or neural foraminal narrowing. L3-L4: Mild disc bulge. Moderate facet arthropathy. No spinal canal stenosis. Mild bilateral neural foraminal narrowing. L4-L5: Mild disc bulge. Moderate facet arthropathy. Narrowing of the bilateral lateral recesses. No spinal canal stenosis. Moderate left-greater-than-right neural foraminal narrowing. L5-S1: Retrolisthesis with mild disc bulge with central annular fissure. Mild-to-moderate facet arthropathy. Narrowing of the lateral recesses. No spinal canal stenosis. Severe left and moderate to severe right neural foraminal narrowing. Mild enlargement of the left extraforaminal L5 nerve, with increased signal on T2 weighted sequences (series 8, image 43), compared to the right L5 nerve. IMPRESSION: 1. No evidence of cauda equina syndrome. No significant spinal canal stenosis in the lumbar spine. 2. L5-S1 severe left and moderate to severe right neural foraminal narrowing. Narrowing of the lateral recesses at this level could affect the descending S1 nerve roots. In addition, mild enlargement of the extraforaminal left L5 nerve, with increased T2 hyperintense signal may indicate compressive myelopathy. 3. L4-L5 moderate left-greater-than-right neural foraminal narrowing. Narrowing of the lateral recesses at this level could affect the descending L5 nerve roots. 4. L3-L4 mild bilateral neural foraminal narrowing. Electronically Signed   By: Merilyn Baba M.D.   On: 07/08/2021 22:25   CT ABDOMEN PELVIS W CONTRAST  Result Date: 07/08/2021 CLINICAL DATA:  Right lower quadrant pain. Known 18 mm stone.  Positive Rovsing's sign. EXAM: CT ABDOMEN AND PELVIS WITH CONTRAST TECHNIQUE: Multidetector CT imaging of the abdomen and pelvis was performed using the standard protocol following bolus administration of intravenous contrast. RADIATION DOSE REDUCTION: This exam was performed according to the departmental dose-optimization program which includes automated exposure control, adjustment of the mA and/or kV according to patient size and/or use of iterative reconstruction technique. CONTRAST:  145mL OMNIPAQUE IOHEXOL 300 MG/ML  SOLN COMPARISON:  CT abdomen and pelvis 06/26/2021 FINDINGS: Lower chest: Mild curvilinear subsegmental atelectasis and/or scarring within the lingula and left lower lobe greater than the middle lobe, similar to prior 06/26/2021 CT. Hepatobiliary: Smooth liver contours. Diffuse decreased density is seen throughout the liver suggesting fatty  infiltration. No focal liver lesion is seen. Status post cholecystectomy. No intrahepatic or extrahepatic biliary ductal dilatation. Pancreas: No mass or inflammatory fat stranding. No pancreatic ductal dilatation is seen. Spleen: A small splenule is again seen just inferior to the spleen. Adrenals/Urinary Tract: Adrenal glands are unremarkable. The kidneys enhance uniformly and are symmetric in size without hydronephrosis. 19 mm right renal midpole stone is not significantly changed from 06/26/2021. Left upper pole 3 mm stone (axial image 31. Left lower pole 4 mm stone (axial image 46). Additional smaller punctate left renal stones. There is again a stone measuring up to 5 mm in transverse dimension and 7 mm in craniocaudal dimension within the posterior aspect of the left ureteropelvic junction with mild upstream pelvicaliectasis, likely partially obstructing. No new stone is seen within the left ureter. No right hydronephrosis. No focal urinary bladder wall thickening. Stomach/Bowel: Moderate stool is seen throughout the colon. No focal bowel wall thickening  is seen the terminal ileum is unremarkable. The appendix is normal in appearance (axial series 2 images 66 through 69). No dilated loops of bowel to indicate bowel obstruction. Vascular/Lymphatic: No abdominal aortic aneurysm. Reproductive: The uterus is present and unremarkable. No adnexal mass is seen. Other: Small fat containing umbilical hernia.No abdominopelvic ascites. No pneumoperitoneum. Musculoskeletal: Mild multilevel degenerative disc changes of the visualized thoracolumbar spine. IMPRESSION:: IMPRESSION: 1. Normal appendix. 2. Redemonstration of bilateral renal stones measuring up to 19 mm on the right and 4 mm on the left. There is again a stone within the posterior aspect of the left ureteropelvic junction dimension, measuring up to 5 mm in transverse dimension and 7 mm in craniocaudal appearing to be partially obstructing, with mild upstream pelvocaliectasis. This is unchanged from prior. 3.  Moderate stool throughout the colon. 4. Fatty liver. Electronically Signed   By: Yvonne Kendall M.D.   On: 07/08/2021 18:48   US PELVIC COMPLETE W TRANSVAGINAL AND TORSION R/O  Result Date: 07/08/2021 CLINICAL DATA:  Pelvic pain for 2 days. EXAM: TRANSABDOMINAL AND TRANSVAGINAL ULTRASOUND OF PELVIS DOPPLER ULTRASOUND OF OVARIES TECHNIQUE: Both transabdominal and transvaginal ultrasound examinations of the pelvis were performed. Transabdominal technique was performed for global imaging of the pelvis including uterus, ovaries, adnexal regions, and pelvic cul-de-sac. It was necessary to proceed with endovaginal exam following the transabdominal exam to visualize the anatomy. Color and duplex Doppler ultrasound was utilized to evaluate blood flow to the ovaries. COMPARISON:  08/03/2012. FINDINGS: Uterus Measurements: 6.9 x 4.1 x 5.7 cm = volume: 84.5 mL. Fibroids are noted at the uterine fundus measuring 1.3 x 1.1 x 1.1 cm and 1.5 x 1.3 x 1.6 cm. Endometrium Thickness: 3.6 mm.  No focal abnormality visualized.  Right ovary Not visualized on transabdominal or transvaginal imaging. Left ovary Measurements: 2.2 x 1.8 x 1.9 cm = volume: 1.8 mL. Not well seen due to patient's body habitus Pulsed Doppler evaluation of both ovaries is limited due to nonvisualization of the right ovary and limited evaluation of the left ovary due to patient's body habitus. Other findings No abnormal free fluid. IMPRESSION: 1. Limited evaluation due to patient's body habitus. The right ovary is not visualized on exam and there is suboptimal evaluation flow in the left ovary. The need for repeat evaluation should be determined clinically. 2. Uterine fibroids. Electronically Signed   By: Brett Fairy M.D.   On: 07/08/2021 20:55   LE VENOUS  Result Date: 07/09/2021  Lower Venous DVT Study Patient Name:  Michele Hall  Date of Exam:  07/09/2021 Medical Rec #: [id number]       Accession #:    [id number] Date of Birth: [birth year]       Patient Gender: F Patient Age:   21 years Exam Location:  [facility] Procedure:      VAS [place] LOWER EXTREMITY VENOUS (DVT) Referring Phys: Gerlene Fee --------------------------------------------------------------------------------  Indications: Right groin pain.  Comparison Study: No prior study Performing Technologist: Maudry Mayhew MHA, RDMS, RVT, RDCS  Examination Guidelines: A complete evaluation includes B-mode imaging, spectral Doppler, color Doppler, and power Doppler as needed of all accessible portions of each vessel. Bilateral testing is considered an integral part of a complete examination. Limited examinations for reoccurring indications may be performed as noted. The reflux portion of the exam is performed with the patient in reverse Trendelenburg.  +---------+---------------+---------+-----------+----------+--------------+  RIGHT     Compressibility Phasicity Spontaneity Properties Thrombus Aging  +---------+---------------+---------+-----------+----------+--------------+  CFV       Full             Yes       Yes                                    +---------+---------------+---------+-----------+----------+--------------+  SFJ       Full                                                             +---------+---------------+---------+-----------+----------+--------------+  FV Prox   Full                                                             +---------+---------------+---------+-----------+----------+--------------+  FV Mid    Full                                                             +---------+---------------+---------+-----------+----------+--------------+  FV Distal Full                                                             +---------+---------------+---------+-----------+----------+--------------+  PFV       Full                                                             +---------+---------------+---------+-----------+----------+--------------+  POP       Full            Yes       Yes                                    +---------+---------------+---------+-----------+----------+--------------+  PTV       Full                                                             +---------+---------------+---------+-----------+----------+--------------+  PERO      Full                                                             +---------+---------------+---------+-----------+----------+--------------+   +----+---------------+---------+-----------+----------+--------------+  LEFT Compressibility Phasicity Spontaneity Properties Thrombus Aging  +----+---------------+---------+-----------+----------+--------------+  CFV  Full            Yes       Yes                                    +----+---------------+---------+-----------+----------+--------------+     Summary: RIGHT: - There is no evidence of deep vein thrombosis in the lower extremity.  - No cystic structure found in the popliteal fossa.  LEFT: - No evidence of common femoral vein obstruction.  *See table(s) above for measurements and  observations. Electronically signed by Jamelle Haring on 07/09/2021 at 3:54:00 PM.    Final     Procedures Procedures    Medications Ordered in ED Medications  iohexol (OMNIPAQUE) 300 MG/ML solution 100 mL (100 mLs Intravenous Contrast Given 07/08/21 1814)  morphine 4 MG/ML injection 4 mg (4 mg Intravenous Given 07/08/21 2002)  ketorolac (TORADOL) 15 MG/ML injection 15 mg (15 mg Intravenous Given 07/08/21 2003)  lactated ringers bolus 1,000 mL (0 mLs Intravenous Stopped 07/08/21 2050)  ondansetron (ZOFRAN) injection 4 mg (4 mg Intravenous Given 07/08/21 2006)  cefTRIAXone (ROCEPHIN) 1 g in sodium chloride 0.9 % 100 mL IVPB (0 g Intravenous Stopped 07/09/21 0210)    ED Course/ Medical Decision Making/ A&P                           Medical Decision Making Amount and/or Complexity of Data Reviewed Labs: ordered. Radiology: ordered. ECG/medicine tests: ordered.  Risk Prescription drug management.   56 year old female with a history of known nephrolithiasis who presents to the emergency department with concern for right lower quadrant abdominal pain and right groin pain.  She is primarily concerned that she has a DVT.  She states that she is having surgery with urology to remove multiple kidney stones on 2/16.  Over the past couple of days, she endorses worsening abdominal pain in this area.  She endorses a heaviness in her leg without acute neurologic deficit.  On chart review, the patient was seen by her PCP.  Per PCP notes, "notices that walking move around she feels weakness in the right leg and will occasionally give out on her.  Sunday (3 days ago) in her right groin she started feeling terrible pain making it hard to even sleep.  She had trouble lying down was very uncomfortable."  She endorses sharp 10 out of 10 pain with associated bulging in her right groin region.  She denies any dysuria, urinary frequency.  She was sent  to the emergency department to evaluate for possible appendicitis or  other acute intra-abdominal emergency given her severe right lower quadrant tenderness to palpation on exam with her PCP.  On additional chart review, the patient has a known 18 mm stone on the right scheduled for removal with [facility] on 2/16. She denies any swelling unilaterally in her RLE. She denies any calf pain.  On arrival, the patient was afebrile, mildly tachycardic to 103, mildly hypertensive BP 145/98, not tachypneic, saturating 96% on room air.  She presents with abdominal pain/right groin pain.  No evidence for cellulitis on exam.  No clear palpable hernia.  Some weakness of the right lower extremity present which has been there for several days.  Patient denies any recent falls or trauma.  She endorses pain that radiates from her back to her groin.  She is concerned for DVT primarily.  Her weakness has made it somewhat difficulty to ambulate.  On exam, she does have mild weakness in the right lower extremity on hip flexion.  She also endorses some urinary symptoms of discomfort while urinating.  She endorses no urinary or fecal incontinence but did state that within the past week she did have to run to the bathroom once in order to urinate because she felt urgency.  Differential diagnosis includes DVT, hernia, UTI, pyelonephritis, appendicitis, SBO, radicular pain from her lower lumbar spine, less likely fracture, low concern for cauda equina.   MRI imaging of the brain and lumbar spine was ordered to evaluate for possible cauda equina or stroke given the patient's right lower extremity weakness, reported urinary symptoms earlier this week.  No evidence for stroke on MRI imaging, no evidence for acute cauda equina on MRI of the spine.    Full results are as below:   IMPRESSION:  1. No evidence of cauda equina syndrome. No significant spinal canal  stenosis in the lumbar spine.  2. L5-S1 severe left and moderate to severe right neural foraminal  narrowing. Narrowing of the lateral  recesses at this level could  affect the descending S1 nerve roots. In addition, mild enlargement  of the extraforaminal left L5 nerve, with increased T2 hyperintense  signal may indicate compressive myelopathy.  3. L4-L5 moderate left-greater-than-right neural foraminal  narrowing. Narrowing of the lateral recesses at this level could  affect the descending L5 nerve roots.  4. L3-L4 mild bilateral neural foraminal narrowing.    The patient's for medial stenosis is present on the right which could be causing her pain and weakness.  Her MRI of the brain was negative for acute stroke.  IMPRESSION::  IMPRESSION:  1. Normal appendix.  2. Redemonstration of bilateral renal stones measuring up to 19 mm  on the right and 4 mm on the left. There is again a stone within the  posterior aspect of the left ureteropelvic junction dimension,  measuring up to 5 mm in transverse dimension and 7 mm in  craniocaudal appearing to be partially obstructing, with mild  upstream pelvocaliectasis. This is unchanged from prior.  3.  Moderate stool throughout the colon.  4. Fatty liver.    Of note, the patient has upcoming procedures with urology regarding her known renal stones.  She is scheduled for percutaneous nephrolithotomy.  The patient has no evidence for sepsis at this time.  Her urinalysis did show evidence of urinary tract infection.  With her urinary urgency earlier this week and known stones, will treat.  I did speak with on-call [facility] who recommended  treatment of the patient's urine with Keflex.  Unfortunately, the patient was unable to obtain DVT ultrasound overnight tonight due to lack of ultrasound capability.  Plan at time of signout to discussed with the patient options for staying overnight in the ED for ultrasound in the morning.  Results of the patient's imaging was communicated with the patient.  Additional plan at time of signout to ambulate the patient for discharge.  Signout given  to Dr. Sedonia Small at [phone].   Final Clinical Impression(s) / ED Diagnoses Final diagnoses:  Pelvic pain  Weakness  Degenerative lumbar spinal stenosis  Right leg pain    Rx / DC Orders ED Discharge Orders          Ordered    cephALEXin (KEFLEX) 500 MG capsule  3 times daily        07/09/21 0058    VAS [place] LOWER EXTREMITY VENOUS (DVT)  Status:  Canceled        07/09/21 0100    LE VENOUS        07/09/21 0101              Regan Lemming, MD 07/10/21 1639

## 2021-07-08 NOTE — ED Provider Triage Note (Addendum)
Emergency Medicine Provider Triage Evaluation Note  Michele Hall. Draeger , a 56 y.o. female  was evaluated in triage.  Pt complains of RLQ pain waxes and wanes  that radiates into groin for the past 2 days. The patient has a significant h/o kidney stones and is known with [facility], Dr. Billy Fischer. Known 12mm on the right scheduled removal in a few weeks. Denies any dysuria or hematuria. Reports right leg heaviness as well.   Review of Systems  Positive: Right leg "heaviness", abdominal pain  Negative: Nausea, vomiting, leg swelling, chest pain, SOB  Physical Exam  BP (!) 145/98 (BP Location: Left Arm)    Pulse (!) 103    Temp 98.8 F (37.1 C) (Oral)    Resp 18    SpO2 96%  Gen:   Awake, no distress   Resp:  Normal effort  MSK:   Moves extremities without difficulty Other:  Mild abdominal tenderness to the RLQ with positive Rovsing's. Palpable pulses DP and PT. Equal sensation. Compartments soft. I do not appreciate any swelling to the RLE in comparison to the LLE.   Medical Decision Making  Medically screening exam initiated at 2:38 PM.  Appropriate orders placed.  Michele Hall was informed that the remainder of the evaluation will be completed by another provider, this initial triage assessment does not replace that evaluation, and the importance of remaining in the ED until their evaluation is complete.  Basic labs ordered. CT abdomen ordered.   Sherrell Puller, PA-C 07/08/21 1447    Sherrell Puller, PA-C 07/08/21 1450

## 2021-07-09 ENCOUNTER — Ambulatory Visit (HOSPITAL_BASED_OUTPATIENT_CLINIC_OR_DEPARTMENT_OTHER)
Admission: RE | Admit: 2021-07-09 | Discharge: 2021-07-09 | Disposition: A | Discharge status: Home / Self Care | Payer: BC Managed Care – PPO | Source: Ambulatory Visit | Attending: Emergency Medicine | Admitting: Emergency Medicine

## 2021-07-09 DIAGNOSIS — R1031 Right lower quadrant pain: Secondary | ICD-10-CM | POA: Insufficient documentation

## 2021-07-09 DIAGNOSIS — M79604 Pain in right leg: Secondary | ICD-10-CM | POA: Diagnosis not present

## 2021-07-09 MED ORDER — CEPHALEXIN 500 MG PO CAPS: 500.0000 mg | ORAL_CAPSULE | Freq: Three times a day (TID) | ORAL | 0 refills | Status: AC

## 2021-07-09 NOTE — Discharge Instructions (Addendum)
You were evaluated in the Emergency Department and after careful evaluation, we did not find any emergent condition requiring admission or further testing in the hospital.  Your exam/testing today was overall reassuring.  Your pain may be related to degenerative disc disease of your back.  Could also be a muscular pain or injury.  The other consideration is a blood clot.  Please return to the emergency department for an ultrasound.  Recommend close follow-up with your primary care doctor.  Please return to the Emergency Department if you experience any worsening of your condition.  Thank you for allowing [place] to be a part of your care.

## 2021-07-09 NOTE — ED Provider Notes (Signed)
°  Provider Note MRN:  [id number]  Arrival date & time: 07/09/21    ED Course and Medical Decision Making  Assumed care from Dr. Armandina Gemma at shift change.  Pelvic/inguinal/upper leg pain, differential including DVT but also stroke given the possibility of weakness on exam.  Awaiting MRI, D-dimer.  MRI brain is normal.  MR lumbar spine is showing some degenerative changes that could be causing patient's pain.  We will treat for UTI.  DVT is a lingering possibility, we discussed options and patient prefers returning for ultrasound rather than waiting longer for D-dimer.  Appropriate for discharge.  Procedures  Final Clinical Impressions(s) / ED Diagnoses     ICD-10-CM   1. Pelvic pain  R10.2 US PELVIC COMPLETE W TRANSVAGINAL AND TORSION R/O    US PELVIC COMPLETE W TRANSVAGINAL AND TORSION R/O      ED Discharge Orders          Ordered    cephALEXin (KEFLEX) 500 MG capsule  3 times daily        07/09/21 0058    VAS [place] LOWER EXTREMITY VENOUS (DVT)  Status:  Canceled        07/09/21 0100    LE VENOUS        07/09/21 0101              Discharge Instructions      You were evaluated in the Emergency Department and after careful evaluation, we did not find any emergent condition requiring admission or further testing in the hospital.  Your exam/testing today was overall reassuring.  Your pain may be related to degenerative disc disease of your back.  Could also be a muscular pain or injury.  The other consideration is a blood clot.  Please return to the emergency department for an ultrasound.  Recommend close follow-up with your primary care doctor.  Please return to the Emergency Department if you experience any worsening of your condition.  Thank you for allowing [place] to be a part of your care.       Barth Kirks. Sedonia Small, [place] [email or web address]    Maudie Flakes, MD 07/09/21 [id number]

## 2021-07-09 NOTE — Progress Notes (Signed)
Right lower extremity venous duplex completed. Refer to "CV Proc" under chart review to view preliminary results.  07/09/2021 11:12 AM Kelby Aline., MHA, RVT, RDCS, RDMS

## 2021-07-10 ENCOUNTER — Other Ambulatory Visit: Payer: Self-pay | Admitting: Family Medicine

## 2021-07-10 ENCOUNTER — Encounter: Payer: Self-pay | Admitting: Family Medicine

## 2021-07-10 LAB — URINE CULTURE

## 2021-07-15 ENCOUNTER — Encounter (HOSPITAL_COMMUNITY)
Admission: RE | Admit: 2021-07-15 | Discharge: 2021-07-15 | Disposition: A | Discharge status: Home / Self Care | Payer: BC Managed Care – PPO | Source: Ambulatory Visit | Attending: Urology | Admitting: Urology

## 2021-07-15 ENCOUNTER — Other Ambulatory Visit: Payer: Self-pay

## 2021-07-15 DIAGNOSIS — Z01818 Encounter for other preprocedural examination: Secondary | ICD-10-CM | POA: Diagnosis not present

## 2021-07-15 DIAGNOSIS — Z20822 Contact with and (suspected) exposure to covid-19: Secondary | ICD-10-CM | POA: Insufficient documentation

## 2021-07-15 NOTE — Patient Instructions (Addendum)
DUE TO COVID-19 ONLY ONE VISITOR IS ALLOWED TO COME WITH YOU AND STAY IN THE WAITING ROOM ONLY DURING PRE OP AND PROCEDURE DAY OF SURGERY IF YOU ARE GOING HOME AFTER SURGERY. IF YOU ARE SPENDING THE NIGHT 2 PEOPLE MAY VISIT WITH YOU IN YOUR PRIVATE ROOM AFTER SURGERY UNTIL VISITING  HOURS ARE OVER AT 8:00 PM AND 1  VISITOR  MAY  SPEND THE NIGHT.   YOU NEED TO HAVE A COVID 19 TEST ON__2/14/23_____ @__9 :00_____, THIS TEST MUST BE DONE BEFORE SURGERY,                 Michele Hall     Your procedure is scheduled on: 07/24/21   Report to [facility] Main  Entrance   Report to admitting at 7:30 AM      Call this number if you have problems the morning of surgery [phone]    Remember: Do not eat food or drink :After Midnight the night before your surgery,      BRUSH YOUR TEETH MORNING OF SURGERY AND RINSE YOUR MOUTH OUT, NO CHEWING GUM CANDY OR MINTS.     Take these medicines the morning of surgery with A SIP OF WATER: none  Stop taking ___________on __________as instructed by _____________.  Stop taking ____________as directed by your Surgeon/Cardiologist.  Contact your Surgeon/Cardiologist for instructions on Anticoagulant Therapy prior to surgery.                                  You may not have any metal on your body including hair pins and              piercings  Do not wear jewelry, make-up, lotions, powders or perfumes, deodorant             Do not wear nail polish on your fingernails.  Do not shave  48 hours prior to surgery.               Do not bring valuables to the hospital. [place].  Contacts, dentures or bridgework may not be worn into surgery.  Leave suitcase in the car. After surgery it may be brought to your room.               [facility] - Preparing for Surgery Before surgery, you can play an important role.  Because skin is not sterile, your skin needs to be as free of germs as possible.   You can reduce the number of germs on your skin by washing with CHG (chlorahexidine gluconate) soap before surgery.  CHG is an antiseptic cleaner which kills germs and bonds with the skin to continue killing germs even after washing. Please DO NOT use if you have an allergy to CHG or antibacterial soaps.  If your skin becomes reddened/irritated stop using the CHG and inform your nurse when you arrive at Short Stay. Do not shave (including legs and underarms) for at least 48 hours prior to the first CHG shower.   Please follow these instructions carefully:  1.  Shower with CHG Soap the night before surgery and the  morning of Surgery.  2.  If you choose to wash your hair, wash your hair first as usual with your  normal  shampoo.  3.  After you shampoo, rinse your  hair and body thoroughly to remove the  shampoo.                            4.  Use CHG as you would any other liquid soap.  You can apply chg directly  to the skin and wash                       Gently with a scrungie or clean washcloth.  5.  Apply the CHG Soap to your body ONLY FROM THE NECK DOWN.   Do not use on face/ open                           Wound or open sores. Avoid contact with eyes, ears mouth and genitals (private parts).                       Wash face,  Genitals (private parts) with your normal soap.             6.  Wash thoroughly, paying special attention to the area where your surgery  will be performed.  7.  Thoroughly rinse your body with warm water from the neck down.  8.  DO NOT shower/wash with your normal soap after using and rinsing off  the CHG Soap.                9.  Pat yourself dry with a clean towel.            10.  Wear clean pajamas.            11.  Place clean sheets on your bed the night of your first shower and do not  sleep with pets. Day of Surgery : Do not apply any lotions/deodorants the morning of surgery.  Please wear clean clothes to the hospital/surgery center.  FAILURE TO FOLLOW THESE  INSTRUCTIONS MAY RESULT IN THE CANCELLATION OF YOUR SURGERY   ________________________________________________________________________

## 2021-07-16 ENCOUNTER — Encounter (HOSPITAL_COMMUNITY): Payer: Self-pay

## 2021-07-16 ENCOUNTER — Encounter (HOSPITAL_COMMUNITY)
Admission: RE | Admit: 2021-07-16 | Discharge: 2021-07-16 | Disposition: A | Discharge status: Home / Self Care | Payer: BC Managed Care – PPO | Source: Ambulatory Visit | Attending: Urology | Admitting: Urology

## 2021-07-16 ENCOUNTER — Other Ambulatory Visit: Payer: Self-pay

## 2021-07-16 DIAGNOSIS — Z01818 Encounter for other preprocedural examination: Secondary | ICD-10-CM

## 2021-07-16 NOTE — Progress Notes (Signed)
COVID test.- 07/22/21 at 9:00 am  Bowel prep reminder:no  PCP - Dr. Lucilla Lame Cardiologist - none  Chest x-ray - no EKG - 07/15/21-chart Stress Test - no ECHO - no Cardiac Cath - no Pacemaker/ICD device last checked:NA  Sleep Study - no CPAP -   Fasting Blood Sugar - NA Checks Blood Sugar _____ times a day  Blood Thinner Instructions:ASA 81/ Dr. Diona Browner Aspirin Instructions:none received. She takes ASA and NSAIDs for back pain. I told her to talk to her Surgeon and ask him. Last Dose:  Anesthesia review: yes  Patient denies shortness of breath, fever, cough and chest pain at PAT appointment Pt had HR 120-113 at the PAT visit. BP 126/87. She said that her HR has been up 108-113. She has been in pain and has a BMI of 40.7. I made Anesthesia PA aware and EKG was done. I told her to call Dr. Diona Browner if she is still concerned about her HR.  Patient verbalized understanding of instructions that were given to them at the PAT appointment. Patient was also instructed that they will need to review over the PAT instructions again at home before surgery. I read the instructions with her over the phone.

## 2021-07-17 ENCOUNTER — Other Ambulatory Visit: Payer: Self-pay | Admitting: Urology

## 2021-07-22 ENCOUNTER — Encounter (HOSPITAL_COMMUNITY)
Admission: RE | Admit: 2021-07-22 | Discharge: 2021-07-22 | Disposition: A | Discharge status: Home / Self Care | Payer: BC Managed Care – PPO | Source: Ambulatory Visit | Attending: Urology | Admitting: Urology

## 2021-07-22 ENCOUNTER — Other Ambulatory Visit: Payer: Self-pay

## 2021-07-22 DIAGNOSIS — Z20822 Contact with and (suspected) exposure to covid-19: Secondary | ICD-10-CM | POA: Insufficient documentation

## 2021-07-22 DIAGNOSIS — Z01812 Encounter for preprocedural laboratory examination: Secondary | ICD-10-CM | POA: Diagnosis present

## 2021-07-22 DIAGNOSIS — Z01818 Encounter for other preprocedural examination: Secondary | ICD-10-CM

## 2021-07-22 LAB — SARS CORONAVIRUS 2 (TAT 6-24 HRS): SARS Coronavirus 2: NEGATIVE

## 2021-07-23 ENCOUNTER — Other Ambulatory Visit: Payer: Self-pay | Admitting: Internal Medicine

## 2021-07-24 ENCOUNTER — Encounter (HOSPITAL_COMMUNITY)
Admission: RE | Disposition: A | Discharge status: Home / Self Care | Payer: Self-pay | Source: Home / Self Care | Attending: Urology

## 2021-07-24 ENCOUNTER — Encounter (HOSPITAL_COMMUNITY): Payer: Self-pay | Admitting: Urology

## 2021-07-24 ENCOUNTER — Other Ambulatory Visit: Payer: Self-pay

## 2021-07-24 ENCOUNTER — Ambulatory Visit (HOSPITAL_COMMUNITY): Payer: BC Managed Care – PPO | Admitting: Anesthesiology

## 2021-07-24 ENCOUNTER — Ambulatory Visit (HOSPITAL_COMMUNITY): Payer: BC Managed Care – PPO

## 2021-07-24 ENCOUNTER — Ambulatory Visit (HOSPITAL_COMMUNITY): Payer: BC Managed Care – PPO | Admitting: Physician Assistant

## 2021-07-24 ENCOUNTER — Ambulatory Visit (HOSPITAL_COMMUNITY)
Admission: RE | Admit: 2021-07-24 | Discharge: 2021-07-24 | Disposition: A | Discharge status: Home / Self Care | Payer: BC Managed Care – PPO | Source: Ambulatory Visit | Attending: Urology | Admitting: Urology

## 2021-07-24 ENCOUNTER — Observation Stay (HOSPITAL_COMMUNITY)
Admission: RE | Admit: 2021-07-24 | Discharge: 2021-07-25 | Disposition: A | Discharge status: Home / Self Care | Payer: BC Managed Care – PPO | Attending: Urology | Admitting: Urology

## 2021-07-24 DIAGNOSIS — Z79899 Other long term (current) drug therapy: Secondary | ICD-10-CM | POA: Diagnosis not present

## 2021-07-24 DIAGNOSIS — Z7982 Long term (current) use of aspirin: Secondary | ICD-10-CM | POA: Insufficient documentation

## 2021-07-24 DIAGNOSIS — N200 Calculus of kidney: Secondary | ICD-10-CM | POA: Insufficient documentation

## 2021-07-24 DIAGNOSIS — N202 Calculus of kidney with calculus of ureter: Principal | ICD-10-CM | POA: Insufficient documentation

## 2021-07-24 DIAGNOSIS — Z87891 Personal history of nicotine dependence: Secondary | ICD-10-CM | POA: Diagnosis not present

## 2021-07-24 DIAGNOSIS — I10 Essential (primary) hypertension: Secondary | ICD-10-CM | POA: Insufficient documentation

## 2021-07-24 DIAGNOSIS — N132 Hydronephrosis with renal and ureteral calculous obstruction: Secondary | ICD-10-CM

## 2021-07-24 DIAGNOSIS — Z01818 Encounter for other preprocedural examination: Secondary | ICD-10-CM

## 2021-07-24 DIAGNOSIS — Z6841 Body Mass Index (BMI) 40.0 and over, adult: Secondary | ICD-10-CM | POA: Insufficient documentation

## 2021-07-24 HISTORY — PX: CYSTOSCOPY/URETEROSCOPY/HOLMIUM LASER/STENT PLACEMENT: SHX6546

## 2021-07-24 HISTORY — PX: HOLMIUM LASER APPLICATION: SHX5852

## 2021-07-24 HISTORY — PX: IR URETERAL STENT RIGHT NEW ACCESS W/O SEP NEPHROSTOMY CATH: IMG6076

## 2021-07-24 HISTORY — PX: IR US GUIDANCE: IMG2393

## 2021-07-24 HISTORY — PX: NEPHROLITHOTOMY: SHX5134

## 2021-07-24 LAB — BASIC METABOLIC PANEL
Anion gap: 8 (ref 5–15)
BUN: 18 mg/dL (ref 6–20)
CO2: 23 mmol/L (ref 22–32)
Calcium: 9 mg/dL (ref 8.9–10.3)
Chloride: 109 mmol/L (ref 98–111)
Creatinine, Ser: 0.68 mg/dL (ref 0.44–1.00)
GFR, Estimated: >60 mL/min (ref >60)
Glucose, Bld: 97 mg/dL (ref 70–99)
Potassium: 3.6 mmol/L (ref 3.5–5.1)
Sodium: 140 mmol/L (ref 135–145)

## 2021-07-24 LAB — CBC
HCT: 40.7 % (ref 36.0–46.0)
HCT: 32.4 % — ABNORMAL LOW (ref 36.0–46.0)
Hemoglobin: 14.5 g/dL (ref 12.0–15.0)
Hemoglobin: 11.1 g/dL — ABNORMAL LOW (ref 12.0–15.0)
MCH: 33 pg (ref 26.0–34.0)
MCH: 33.4 pg (ref 26.0–34.0)
MCHC: 35.6 g/dL (ref 30.0–36.0)
MCHC: 34.3 g/dL (ref 30.0–36.0)
MCV: 92.5 fL (ref 80.0–100.0)
MCV: 97.6 fL (ref 80.0–100.0)
Platelets: 231 10*3/uL (ref 150–400)
Platelets: 234 10*3/uL (ref 150–400)
RBC: 4.4 MIL/uL (ref 3.87–5.11)
RBC: 3.32 MIL/uL — ABNORMAL LOW (ref 3.87–5.11)
RDW: 12.3 % (ref 11.5–15.5)
RDW: 12.7 % (ref 11.5–15.5)
WBC: 6.8 10*3/uL (ref 4.0–10.5)
WBC: 14.7 10*3/uL — ABNORMAL HIGH (ref 4.0–10.5)
nRBC: 0 % (ref 0.0–0.2)
nRBC: 0 % (ref 0.0–0.2)

## 2021-07-24 LAB — TYPE AND SCREEN
ABO/RH(D): O POS
Antibody Screen: NEGATIVE

## 2021-07-24 LAB — PROTIME-INR
INR: 1 (ref 0.8–1.2)
Prothrombin Time: 13.6 seconds (ref 11.4–15.2)

## 2021-07-24 LAB — ABO/RH: ABO/RH(D): O POS

## 2021-07-24 IMAGING — XA IR URETURAL STENT RIGHT NEW ACCESS W/O SEP NEPHROSTOMY CATH
2 series · 9 of 9 positions shown · non-contrast
Comparison: CT of the abdomen and pelvis-[DATE]

INDICATION: Renal stones, access for right percutaneous nephrolithotomy.

EXAM:
1. FLUOROSCOPIC GUIDANCE FOR PUNCTURE OF THE right SIDED RENAL
COLLECTING SYSTEM.
2. FLUOROSCOPIC GUIDED PLACEMENT OF A right SIDED NEPHROURETERAL
CATHETER.
3. Intravenous pyelogram.

[Series 1: ir uretural stent right new access w/o sep nephros · 1 of 1 slices shown]
[im 1/1]
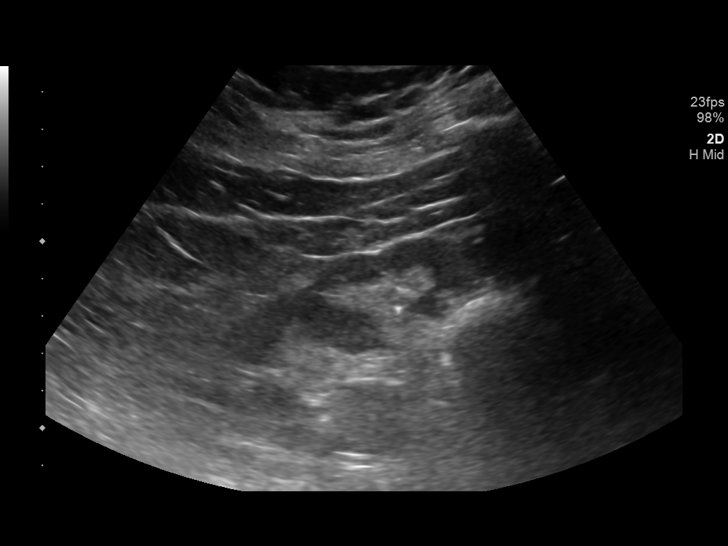

[Series 1: processed: ir ureteral stent right new a · non-contrast · 2 acquisitions, 8 frames shown]
[im 1/2]
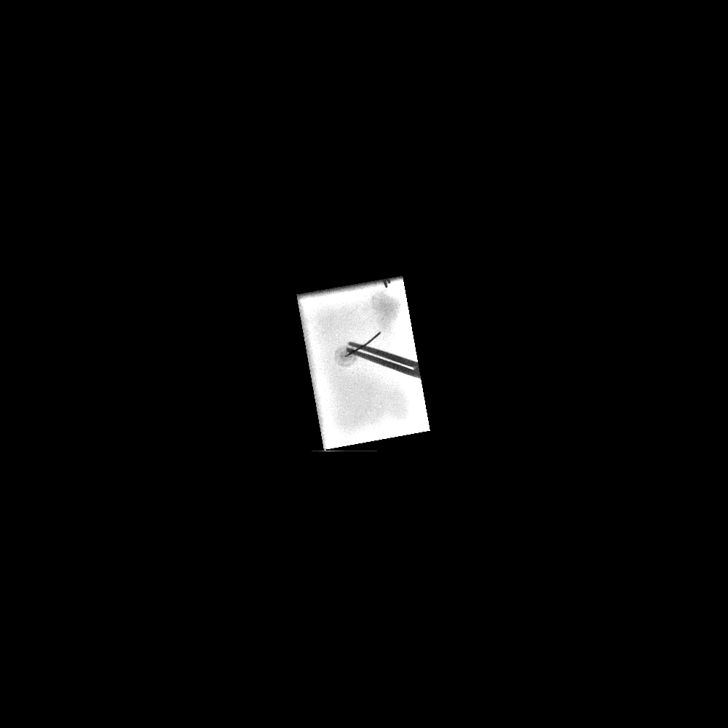
[im 1/2]
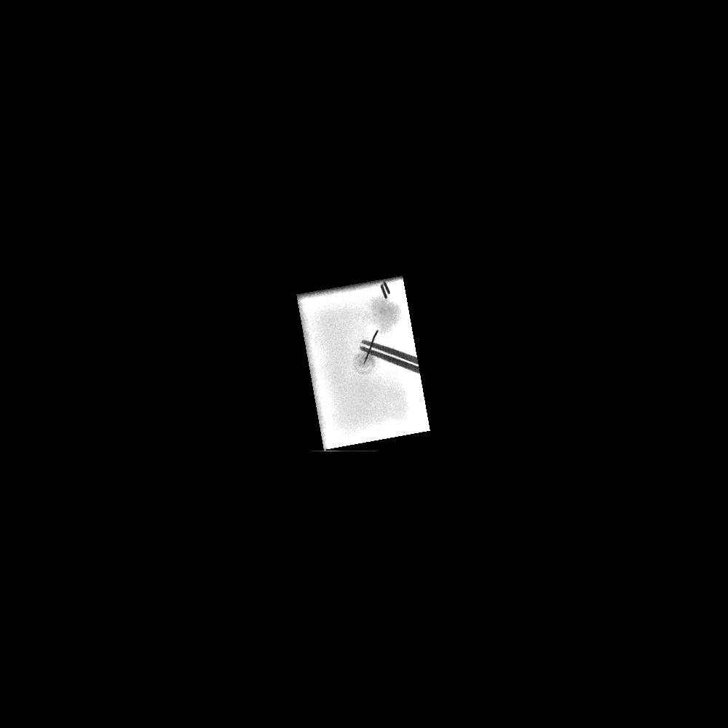
[im 1/2]
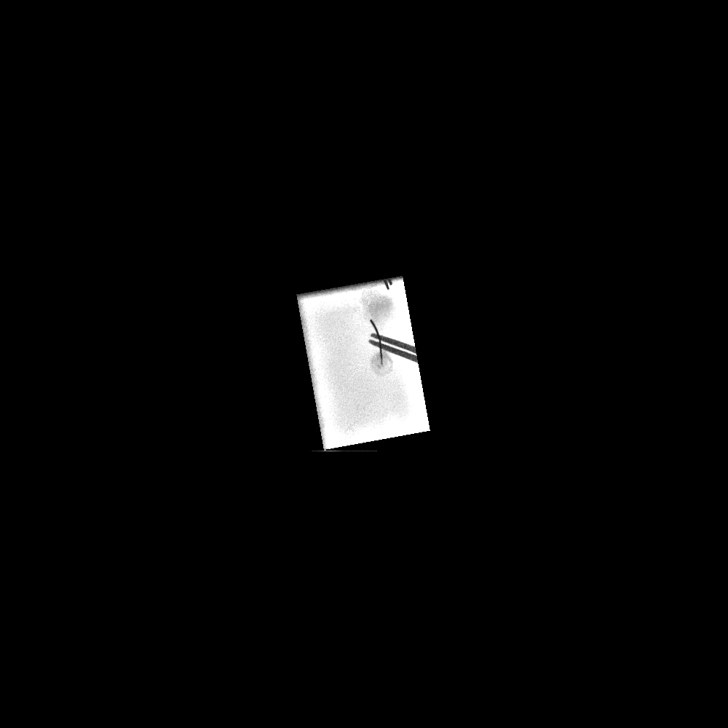
[im 1/2]
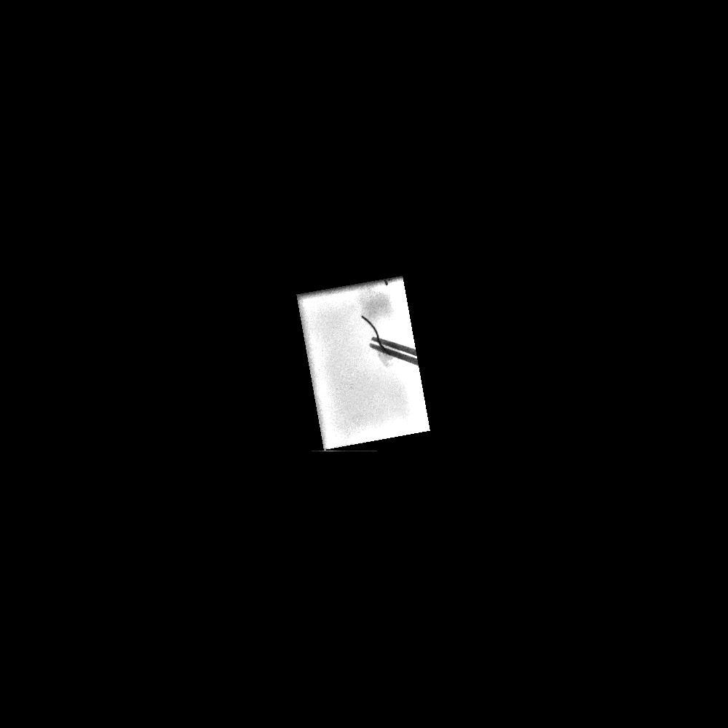
[im 2/2]
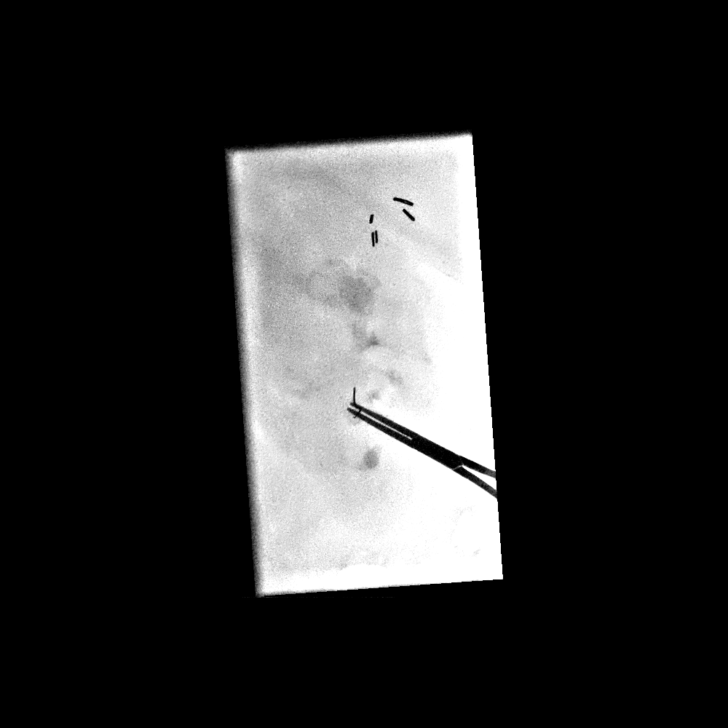
[im 2/2]
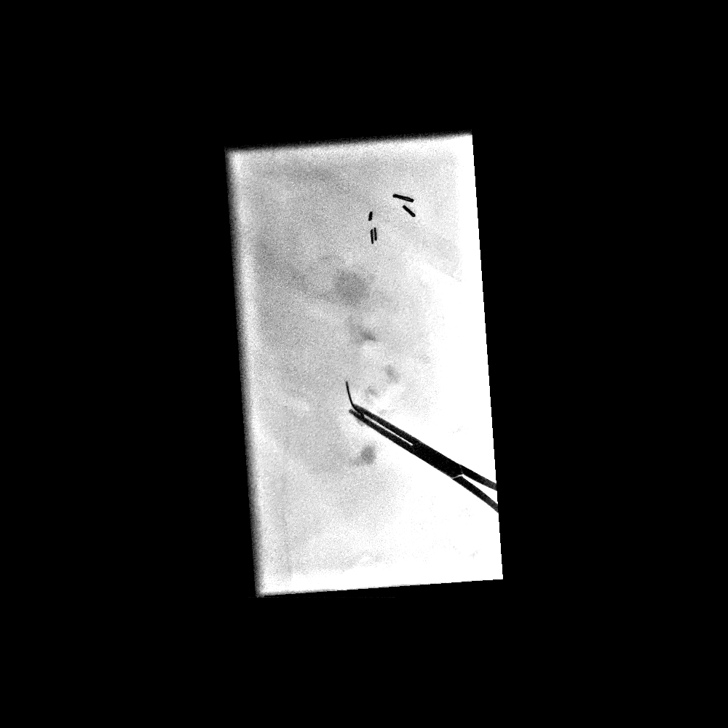
[im 2/2]
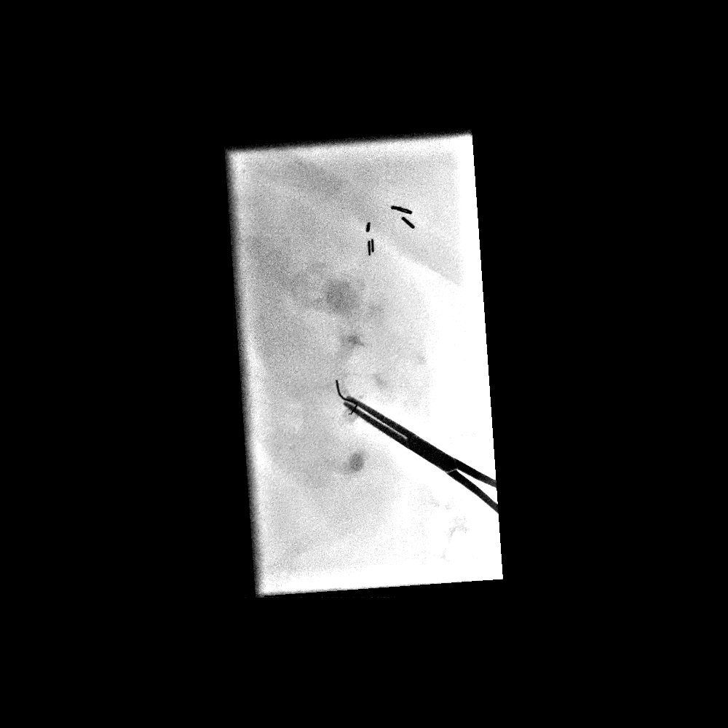
[im 2/2]
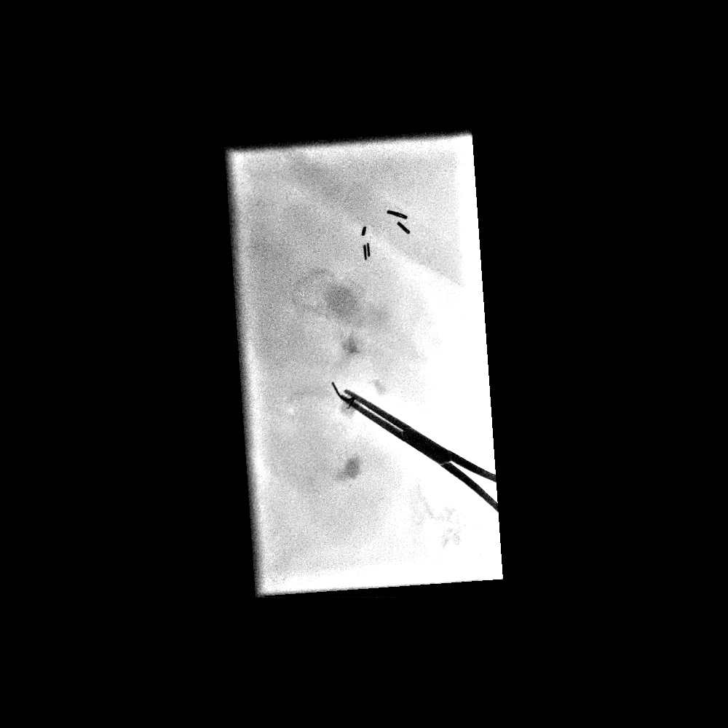

[9 of 9 positions shown; findings below may reference images not displayed]

MEDICATIONS:
Rocephin 2 gm IV; The antibiotic was administered in an appropriate
time frame prior to skin puncture.

ANESTHESIA/SEDATION:
Moderate (conscious) sedation was employed during this procedure. A
total of Versed 6 mg and Fentanyl 350 mcg was administered
intravenously.

Moderate Sedation Time: 115 minutes. The patient's level of
consciousness and vital signs were monitored continuously by
radiology nursing throughout the procedure under my direct
supervision.

CONTRAST:  Seventy mL Omnipaque 300-Administered into the renal
collecting system. 80 mL Omnipaque 300 administered intravenously.

FLUOROSCOPY TIME:  minutes  seconds ( mGy)

COMPLICATIONS:
None immediate.

PROCEDURE:
Informed written consent was obtained from the patient after a
discussion of the risks, benefits, and alternatives to treatment.
The right flank region was prepped with chlorhexidine in a sterile
fashion, and a sterile drape was applied covering the operative
field. A sterile gown and sterile gloves were used for the
procedure. A timeout was performed prior to the initiation of the
procedure.

A pre procedural spot fluoroscopic image was obtained of the upper
abdomen. Ultrasound scanning performed of the kidney was negative
for significant hydronephrosis. As such, the stone within right
upper pole was targeted fluoroscopically with a 22 gauge Chiba
needle. Access to the collecting system was unable to be obtained
with advancement of a Nitrex wire into the collecting system due to
extensive stone burden in the peripheral aspect of the calyx.

Given inability to visualize calices or lower pole nephrolithiasis
sonographically, intravenous pyelogram was performed. Intravenous
contrast was injected in approximately 4 minutes after injection
there was faint opacification of the collecting system. The central
collecting system was then punctured under direct fluoroscopic
guidance with the 21 gauge Chiba needle. Gentle hand injection of
contrast demonstrated needle tip position within the collecting
system. A Nitrex wire was inserted and advanced to the proximal
ureter. The needle was exchanged for the inner 3 French catheter
from an Accustick set. A combination of contrast and air was
injected into the collecting system for better visualization. A
small calyx adjacent to the lower pole renal calculus was opacified
and targeted. Despite advancing the needle to the calyx, the Nitrex
wire was unable to be advanced into the collecting system.

Therefore, an additional peripheral calyx in the mid lower pole was
targeted and access with a separate 21 gauge Chiba needle under
direct fluoroscopic guidance. There was immediate E flux of blood
tinged urine. A Nitrex wire was inserted and directed into the
proximal ureter. An Accustick set was utilized to dilate the tract
and was subsequently exchanged for a Kumpe catheter over a Bentson
wire. The Kumpe catheter was advanced down the ureter and into the
urinary bladder.

Postprocedural spot radiographs were obtained in various obliquities
and the catheter was sutured to the skin. The catheter was capped
and a dressing was placed.

The patient tolerated the procedure well without immediate post
procedural complication.
FINDINGS: Pre procedural spot radiographic images demonstrates upper and lower
pole radiopaque renal calculi.

Ultrasound scanning was negative for significant hydronephrosis and
as such, the stone was targeted fluoroscopically allowing access to
the collecting system.

Difficult to access due to stone burden in the upper and lower
poles, unable to accept access needles. Intravenous pyelogram was
performed for puncture of the renal pelvis in order to opacify the
collecting system. A posterior, inferomedial pole calyx was targeted
and accessed, allowing placement of a Kumpe catheter through the
calyx with tip advanced into the urinary bladder.
IMPRESSION: Successful fluoroscopic guided placement of a right sided 5 French
Kumpe catheter to the level of the urinary bladder to be utilized
during impending nephrolithotomy procedure.

## 2021-07-24 SURGERY — NEPHROLITHOTOMY PERCUTANEOUS
Anesthesia: General | Site: Flank | Laterality: Right

## 2021-07-24 MED ORDER — PHENYLEPHRINE 40 MCG/ML (10ML) SYRINGE FOR IV PUSH (FOR BLOOD PRESSURE SUPPORT)
PREFILLED_SYRINGE | INTRAVENOUS | Status: DC | PRN
  Administered 2021-07-24 (×2): 80 ug via INTRAVENOUS

## 2021-07-24 MED ORDER — SODIUM CHLORIDE 0.9 % IV SOLN
2.0000 g | Freq: Once | INTRAVENOUS | Status: AC
  Administered 2021-07-24: 2 g via INTRAVENOUS
  Filled 2021-07-24 (×2): qty 20

## 2021-07-24 MED ORDER — FENTANYL CITRATE (PF) 100 MCG/2ML IJ SOLN
INTRAMUSCULAR | Status: AC
  Filled 2021-07-24: qty 2

## 2021-07-24 MED ORDER — FENTANYL CITRATE (PF) 100 MCG/2ML IJ SOLN
INTRAMUSCULAR | Status: DC | PRN
  Administered 2021-07-24: 75 ug via INTRAVENOUS
  Administered 2021-07-24: 25 ug via INTRAVENOUS

## 2021-07-24 MED ORDER — LIDOCAINE HCL 1 % IJ SOLN
INTRAMUSCULAR | Status: AC
  Filled 2021-07-24: qty 20

## 2021-07-24 MED ORDER — ROCURONIUM BROMIDE 10 MG/ML (PF) SYRINGE
PREFILLED_SYRINGE | INTRAVENOUS | Status: DC | PRN
  Administered 2021-07-24: 40 mg via INTRAVENOUS
  Administered 2021-07-24: 60 mg via INTRAVENOUS

## 2021-07-24 MED ORDER — PROPOFOL 10 MG/ML IV BOLUS
INTRAVENOUS | Status: DC | PRN
Start: 2021-07-24 — End: 2021-07-24
  Administered 2021-07-24: 150 mg via INTRAVENOUS

## 2021-07-24 MED ORDER — LORATADINE 10 MG PO TABS
10.0000 mg | ORAL_TABLET | Freq: Every day | ORAL | Status: DC
Start: 2021-07-24 — End: 2021-07-25
  Administered 2021-07-24 – 2021-07-25 (×2): 10 mg via ORAL
  Filled 2021-07-24 (×2): qty 1

## 2021-07-24 MED ORDER — SODIUM CHLORIDE 0.9 % IV SOLN: 2.0000 g | INTRAVENOUS | Status: DC

## 2021-07-24 MED ORDER — ALBUMIN HUMAN 5 % IV SOLN: INTRAVENOUS | Status: DC | PRN

## 2021-07-24 MED ORDER — SULFAMETHOXAZOLE-TRIMETHOPRIM 800-160 MG PO TABS
1.0000 | ORAL_TABLET | Freq: Two times a day (BID) | ORAL | Status: DC
  Administered 2021-07-24 – 2021-07-25 (×2): 1 via ORAL
  Filled 2021-07-24 (×2): qty 1

## 2021-07-24 MED ORDER — SODIUM CHLORIDE 0.9% FLUSH: 5.0000 mL | Freq: Three times a day (TID) | INTRAVENOUS | Status: DC

## 2021-07-24 MED ORDER — MIDAZOLAM HCL 2 MG/2ML IJ SOLN
INTRAMUSCULAR | Status: AC | PRN
  Administered 2021-07-24: 1 mg via INTRAVENOUS

## 2021-07-24 MED ORDER — SODIUM CHLORIDE 0.9 % IR SOLN
Status: DC | PRN
  Administered 2021-07-24 (×6): 3000 mL

## 2021-07-24 MED ORDER — LIDOCAINE HCL (PF) 1 % IJ SOLN
INTRAMUSCULAR | Status: AC | PRN
  Administered 2021-07-24: 5 mL via INTRADERMAL

## 2021-07-24 MED ORDER — FENTANYL CITRATE (PF) 100 MCG/2ML IJ SOLN
INTRAMUSCULAR | Status: DC
Start: 2021-07-24 — End: 2021-07-24
  Filled 2021-07-24: qty 4

## 2021-07-24 MED ORDER — PROMETHAZINE HCL 25 MG/ML IJ SOLN
INTRAMUSCULAR | Status: AC
  Filled 2021-07-24: qty 1

## 2021-07-24 MED ORDER — IOHEXOL 300 MG/ML  SOLN
INTRAMUSCULAR | Status: AC | PRN
  Administered 2021-07-24: 50 mL via INTRAVENOUS

## 2021-07-24 MED ORDER — FENTANYL CITRATE (PF) 100 MCG/2ML IJ SOLN
INTRAMUSCULAR | Status: AC | PRN
  Administered 2021-07-24: 50 ug via INTRAVENOUS

## 2021-07-24 MED ORDER — ORAL CARE MOUTH RINSE: 15.0000 mL | Freq: Once | OROMUCOSAL | Status: AC

## 2021-07-24 MED ORDER — SENNA 8.6 MG PO TABS
1.0000 | ORAL_TABLET | Freq: Two times a day (BID) | ORAL | Status: DC
  Administered 2021-07-24 – 2021-07-25 (×2): 8.6 mg via ORAL
  Filled 2021-07-24 (×2): qty 1

## 2021-07-24 MED ORDER — SUGAMMADEX SODIUM 500 MG/5ML IV SOLN
INTRAVENOUS | Status: DC | PRN
  Administered 2021-07-24: 200 mg via INTRAVENOUS

## 2021-07-24 MED ORDER — LIDOCAINE 2% (20 MG/ML) 5 ML SYRINGE
INTRAMUSCULAR | Status: DC | PRN
  Administered 2021-07-24: 80 mg via INTRAVENOUS

## 2021-07-24 MED ORDER — ONDANSETRON HCL 4 MG/2ML IJ SOLN
INTRAMUSCULAR | Status: AC
  Filled 2021-07-24: qty 2

## 2021-07-24 MED ORDER — MIDAZOLAM HCL 2 MG/2ML IJ SOLN
INTRAMUSCULAR | Status: AC
  Filled 2021-07-24: qty 2

## 2021-07-24 MED ORDER — ALBUMIN HUMAN 5 % IV SOLN
INTRAVENOUS | Status: AC
  Filled 2021-07-24: qty 250

## 2021-07-24 MED ORDER — PROMETHAZINE HCL 25 MG/ML IJ SOLN
6.2500 mg | INTRAMUSCULAR | Status: DC | PRN
  Administered 2021-07-24: 6.25 mg via INTRAVENOUS

## 2021-07-24 MED ORDER — OXYCODONE HCL 5 MG PO TABS
5.0000 mg | ORAL_TABLET | ORAL | Status: DC | PRN
  Administered 2021-07-24 – 2021-07-25 (×4): 5 mg via ORAL
  Filled 2021-07-24 (×4): qty 1

## 2021-07-24 MED ORDER — ONDANSETRON HCL 4 MG/2ML IJ SOLN: 4.0000 mg | INTRAMUSCULAR | Status: DC | PRN

## 2021-07-24 MED ORDER — IOHEXOL 300 MG/ML  SOLN
INTRAMUSCULAR | Status: AC | PRN
Start: 2021-07-24 — End: 2021-07-24
  Administered 2021-07-24: 50 mL via URETHRAL

## 2021-07-24 MED ORDER — CEFAZOLIN SODIUM-DEXTROSE 2-4 GM/100ML-% IV SOLN
2.0000 g | INTRAVENOUS | Status: AC
  Administered 2021-07-24: 2 g via INTRAVENOUS
  Filled 2021-07-24: qty 100

## 2021-07-24 MED ORDER — DEXAMETHASONE SODIUM PHOSPHATE 10 MG/ML IJ SOLN
INTRAMUSCULAR | Status: DC | PRN
  Administered 2021-07-24: 10 mg via INTRAVENOUS

## 2021-07-24 MED ORDER — SODIUM CHLORIDE 0.45 % IV SOLN: INTRAVENOUS | Status: DC

## 2021-07-24 MED ORDER — SUGAMMADEX SODIUM 500 MG/5ML IV SOLN
INTRAVENOUS | Status: AC
  Filled 2021-07-24: qty 5

## 2021-07-24 MED ORDER — FENTANYL CITRATE (PF) 100 MCG/2ML IJ SOLN
INTRAMUSCULAR | Status: AC | PRN
  Administered 2021-07-24: 25 ug via INTRAVENOUS

## 2021-07-24 MED ORDER — LACTATED RINGERS IV SOLN
INTRAVENOUS | Status: DC
Start: 2021-07-24 — End: 2021-07-24

## 2021-07-24 MED ORDER — FENTANYL CITRATE PF 50 MCG/ML IJ SOSY: 25.0000 ug | PREFILLED_SYRINGE | INTRAMUSCULAR | Status: DC | PRN

## 2021-07-24 MED ORDER — SODIUM CHLORIDE 0.9 % IV SOLN
INTRAVENOUS | Status: DC
Start: 2021-07-24 — End: 2021-07-24

## 2021-07-24 MED ORDER — HYDROMORPHONE HCL 1 MG/ML IJ SOLN
0.5000 mg | INTRAMUSCULAR | Status: DC | PRN
  Administered 2021-07-24 – 2021-07-25 (×3): 0.5 mg via INTRAVENOUS
  Filled 2021-07-24 (×3): qty 1

## 2021-07-24 MED ORDER — LIDOCAINE HCL (PF) 1 % IJ SOLN
INTRAMUSCULAR | Status: AC | PRN
  Administered 2021-07-24 (×2): 10 mL via INTRADERMAL

## 2021-07-24 MED ORDER — CHLORHEXIDINE GLUCONATE 0.12 % MT SOLN
15.0000 mL | Freq: Once | OROMUCOSAL | Status: AC
  Administered 2021-07-24: 15 mL via OROMUCOSAL

## 2021-07-24 MED ORDER — IOHEXOL 300 MG/ML  SOLN
50.0000 mL | Freq: Once | INTRAMUSCULAR | Status: AC | PRN
  Administered 2021-07-24: 50 mL

## 2021-07-24 MED ORDER — LIDOCAINE HCL (PF) 1 % IJ SOLN
INTRAMUSCULAR | Status: AC | PRN
  Administered 2021-07-24: 10 mL

## 2021-07-24 MED ORDER — MIDAZOLAM HCL 2 MG/2ML IJ SOLN
INTRAMUSCULAR | Status: AC
  Filled 2021-07-24: qty 6

## 2021-07-24 MED ORDER — OXYBUTYNIN CHLORIDE 5 MG PO TABS: 5.0000 mg | ORAL_TABLET | Freq: Three times a day (TID) | ORAL | Status: DC | PRN

## 2021-07-24 MED ORDER — LIDOCAINE HCL (PF) 2 % IJ SOLN
INTRAMUSCULAR | Status: AC
  Filled 2021-07-24: qty 5

## 2021-07-24 MED ORDER — ONDANSETRON HCL 4 MG/2ML IJ SOLN
INTRAMUSCULAR | Status: DC | PRN
  Administered 2021-07-24: 4 mg via INTRAVENOUS

## 2021-07-24 MED ORDER — SPIRONOLACTONE 25 MG PO TABS
50.0000 mg | ORAL_TABLET | Freq: Every day | ORAL | Status: DC
Start: 2021-07-24 — End: 2021-07-25
  Administered 2021-07-24 – 2021-07-25 (×2): 50 mg via ORAL
  Filled 2021-07-24 (×2): qty 2

## 2021-07-24 MED ORDER — PHENYLEPHRINE 40 MCG/ML (10ML) SYRINGE FOR IV PUSH (FOR BLOOD PRESSURE SUPPORT)
PREFILLED_SYRINGE | INTRAVENOUS | Status: AC
  Filled 2021-07-24: qty 10

## 2021-07-24 MED ORDER — DEXAMETHASONE SODIUM PHOSPHATE 10 MG/ML IJ SOLN
INTRAMUSCULAR | Status: AC
  Filled 2021-07-24: qty 1

## 2021-07-24 MED ORDER — IOHEXOL 300 MG/ML  SOLN
INTRAMUSCULAR | Status: DC | PRN
  Administered 2021-07-24: 20 mL
  Administered 2021-07-24: 7 mL

## 2021-07-24 SURGICAL SUPPLY — 59 items
BAG COUNTER SPONGE SURGICOUNT (BAG) IMPLANT
BAG URINE DRAIN 2000ML AR STRL (UROLOGICAL SUPPLIES) IMPLANT
BAG URO CATCHER STRL LF (MISCELLANEOUS) ×4 IMPLANT
BASKET STONE NCOMPASS (UROLOGICAL SUPPLIES) ×1 IMPLANT
BASKET ZERO TIP NITINOL 2.4FR (BASKET) ×3 IMPLANT
BENZOIN TINCTURE PRP APPL 2/3 (GAUZE/BANDAGES/DRESSINGS) ×8 IMPLANT
BLADE SURG 15 STRL LF DISP TIS (BLADE) ×3 IMPLANT
BLADE SURG 15 STRL SS (BLADE) ×4
CATH FOLEY 2W COUNCIL 20FR 5CC (CATHETERS) IMPLANT
CATH FOLEY 2W COUNCIL 5CC 16FR (CATHETERS) ×1 IMPLANT
CATH ROBINSON RED A/P 20FR (CATHETERS) IMPLANT
CATH URETL OPEN END 6FR 70 (CATHETERS) IMPLANT
CATH X-FORCE N30 NEPHROSTOMY (TUBING) ×4 IMPLANT
CHLORAPREP W/TINT 26 (MISCELLANEOUS) ×4 IMPLANT
COVER SURGICAL LIGHT HANDLE (MISCELLANEOUS) ×4 IMPLANT
DRAPE C-ARM 42X120 X-RAY (DRAPES) ×4 IMPLANT
DRAPE LINGEMAN PERC (DRAPES) ×4 IMPLANT
DRAPE SURG IRRIG POUCH 19X23 (DRAPES) ×4 IMPLANT
DRSG PAD ABDOMINAL 8X10 ST (GAUZE/BANDAGES/DRESSINGS) ×2 IMPLANT
DRSG TEGADERM 8X12 (GAUZE/BANDAGES/DRESSINGS) ×8 IMPLANT
EXTRACTOR STONE 1.7FRX115CM (UROLOGICAL SUPPLIES) ×4 IMPLANT
FIBER LASER FLEXIVA 365 (UROLOGICAL SUPPLIES) ×1 IMPLANT
GAUZE SPONGE 4X4 12PLY STRL (GAUZE/BANDAGES/DRESSINGS) ×1 IMPLANT
GLOVE SURG ENC TEXT LTX SZ8 (GLOVE) ×4 IMPLANT
GOWN STRL REUS W/TWL XL LVL3 (GOWN DISPOSABLE) ×4 IMPLANT
GUIDEWIRE AMPLAZ .035X145 (WIRE) ×8 IMPLANT
GUIDEWIRE ANG ZIPWIRE 038X150 (WIRE) IMPLANT
GUIDEWIRE STR DUAL SENSOR (WIRE) ×4 IMPLANT
IV NS 1000ML (IV SOLUTION) ×4
IV NS 1000ML BAXH (IV SOLUTION) ×3 IMPLANT
KIT BASIN OR (CUSTOM PROCEDURE TRAY) ×4 IMPLANT
KIT PROBE 340X3.4XDISP GRN (MISCELLANEOUS) IMPLANT
KIT PROBE TRILOGY 3.4X340 (MISCELLANEOUS)
KIT PROBE TRILOGY 3.9X350 (MISCELLANEOUS) ×1 IMPLANT
KIT TURNOVER KIT A (KITS) IMPLANT
LASER FIB FLEXIVA PULSE ID 365 (Laser) IMPLANT
LASER FIB FLEXIVA PULSE ID 550 (Laser) IMPLANT
LASER FIB FLEXIVA PULSE ID 910 (Laser) IMPLANT
MANIFOLD NEPTUNE II (INSTRUMENTS) ×4 IMPLANT
NS IRRIG 1000ML POUR BTL (IV SOLUTION) ×4 IMPLANT
PACK CYSTO (CUSTOM PROCEDURE TRAY) ×4 IMPLANT
SHEATH NAVIGATOR HD 11/13X28 (SHEATH) IMPLANT
SHEATH NAVIGATOR HD 11/13X36 (SHEATH) IMPLANT
SHEATH NAVIGATOR HD 12/14X46 (SHEATH) IMPLANT
SHEATH PEELAWAY SET 9 (SHEATH) ×4 IMPLANT
SHEATH URETERAL 12FRX35CM (MISCELLANEOUS) ×1 IMPLANT
SPONGE T-LAP 4X18 ~~LOC~~+RFID (SPONGE) ×4 IMPLANT
STENT URET 6FRX26 CONTOUR (STENTS) ×2 IMPLANT
SUT SILK 2 0 30  PSL (SUTURE) ×8
SUT SILK 2 0 30 PSL (SUTURE) ×3 IMPLANT
SYR 10ML LL (SYRINGE) ×4 IMPLANT
SYR 20ML LL LF (SYRINGE) ×8 IMPLANT
TRACTIP FLEXIVA PULS ID 200XHI (Laser) IMPLANT
TRACTIP FLEXIVA PULSE ID 200 (Laser) ×4
TRAY FOLEY MTR SLVR 16FR STAT (SET/KITS/TRAYS/PACK) ×4 IMPLANT
TUBING CONNECTING 10 (TUBING) ×8 IMPLANT
TUBING STONE CATCHER TRILOGY (MISCELLANEOUS) ×1 IMPLANT
TUBING UROLOGY SET (TUBING) ×4 IMPLANT
WATER STERILE IRR 1000ML POUR (IV SOLUTION) ×4 IMPLANT

## 2021-07-24 NOTE — H&P (Signed)
H&P  Chief Complaint: Kidney stones  History of Present Illness: Michele Hall. Michele Hall is a 56 y.o. year old female presenting for management of bilateral calculi today.  She has a large right renal stone burden in her upper/interpolar kidney as well as lower calyceal stones on that ipsilateral kidney.  Additionally, she has been symptomatic with a left upper ureteral calculus which recently passed after initial scheduling of the percutaneous procedure.  She is aware of the percutaneous nephrolithotomy procedure, risks and complications as well as ureteroscopy on the contralateral left side.  She desires to proceed.  Past Medical History:  Diagnosis Date   Anal fissure - posterior 2015   Gallstones    GERD (gastroesophageal reflux disease)    History of kidney stones    Hypertension    Morbid obesity ([place])     Past Surgical History:  Procedure Laterality Date   CHOLECYSTECTOMY  1988   CYSTOSCOPY WITH RETROGRADE PYELOGRAM, URETEROSCOPY AND STENT PLACEMENT Left 04/18/2020   Procedure: CYSTOSCOPY WITH RETROGRADE PYELOGRAM, URETEROSCOPY , STONE EXTRACTION AND STENT PLACEMENT;  Surgeon: Franchot Gallo, MD;  Location: [facility];  Service: Urology;  Laterality: Left;  75 MINS   DILITATION & CURRETTAGE/HYSTROSCOPY WITH HYDROTHERMAL ABLATION N/A 09/13/2012   Procedure: DILATATION & CURETTAGE/HYSTEROSCOPY WITH HYDROTHERMAL ABLATION;  Surgeon: Osborne Oman, MD;  Location: [facility];  Service: Gynecology;  Laterality: N/A;   HOLMIUM LASER APPLICATION Left 16/03/9603   Procedure: HOLMIUM LASER LITHOTRIPSY;  Surgeon: Franchot Gallo, MD;  Location: [facility];  Service: Urology;  Laterality: Left;   TUBAL LIGATION  1990    Home Medications:  Medications Prior to Admission  Medication Sig Dispense Refill   aspirin 81 MG chewable tablet Chew 81 mg by mouth daily.     fexofenadine (ALLEGRA) 180 MG tablet TAKE 1 TABLET BY MOUTH EVERY DAY 90 tablet 3   Probiotic Product (DIGESTIVE ADVANTAGE) CAPS Take 1  capsule by mouth daily.     spironolactone (ALDACTONE) 50 MG tablet TAKE 1 TABLET BY MOUTH EVERY DAY 90 tablet 3    Allergies:  Allergies  Allergen Reactions   Requip [Ropinirole Hcl] Other (See Comments)    Headache, Feels "sick"    Family History  Problem Relation Age of Onset   Stroke Mother    Kidney disease Father    Coronary artery disease Father    Aneurysm Father        AAA   Hypertension Father    Seizures Sister    Heart attack Maternal Grandfather    Cancer Sister        ovarian/uterus   Tongue cancer Sister    Colon cancer Neg Hx    Stomach cancer Neg Hx    Urticaria Neg Hx    Immunodeficiency Neg Hx    Eczema Neg Hx    Atopy Neg Hx    Asthma Neg Hx    Angioedema Neg Hx    Allergic rhinitis Neg Hx     Social History:  reports that she quit smoking about 19 years ago. Her smoking use included cigarettes. She has a 1.25 pack-year smoking history. She has never used smokeless tobacco. She reports that she does not drink alcohol and does not use drugs.  ROS: A complete review of systems was performed.  All systems are negative except for pertinent findings as noted.  Physical Exam:  Vital signs in last 24 hours: Temp:  [97.7 F (36.5 C)] 97.7 F (36.5 C) (02/16 0835) Pulse Rate:  [76-108] 81 (02/16 1010)  Resp:  [12-23] 17 (02/16 1010) BP: (121-144)/(72-107) 127/91 (02/16 1010) SpO2:  [96 %-99 %] 98 % (02/16 1010) Weight:  [119.3 kg] 119.3 kg (02/16 0831) General:  Alert and oriented, No acute distress HEENT: Normocephalic, atraumatic Neck: No JVD or lymphadenopathy Cardiovascular: Regular rate  Lungs: Normal inspiratory/expiratory excursion Abdomen: Soft, nontender, nondistended, no abdominal masses Back: No CVA tenderness Extremities: No edema Neurologic: Grossly intact  I have reviewed prior pt notes  I have reviewed notes from referring/previous physicians  I have reviewed urinalysis results  I have independently reviewed prior imaging  I  have reviewed prior urine culture   Impression/Assessment:  1.  Large right renal stone burden, for percutaneous nephrolithotomy  2.  Left upper ureteral calculus with symptoms, for ureteroscopic management with holmium laser  Plan:  We will first proceed with ureteroscopic management of the left upper ureteral stone followed by percutaneous management of the right renal stone burden.  Michele Hall Vernal Hritz 07/24/2021, 10:15 AM  Michele Hall. Michele Standifer MD

## 2021-07-24 NOTE — Procedures (Signed)
Interventional Radiology Procedure Note  Procedure: Image guided right percutaneous nephrostomy access  Findings: Please refer to procedural dictation for full description. Upper pole and lower pole calyces with indwelling stone burden unable to be accessed.  Mid to lower pole access with 5 Fr angle tipped catheter, distal tip in the urinary bladder.  Complications: None immediate  Estimated Blood Loss: < 60mL  Recommendations: To OR as per Urology.   Ruthann Cancer, MD Pager: [phone]

## 2021-07-24 NOTE — Anesthesia Postprocedure Evaluation (Signed)
Anesthesia Post Note  Patient: Michele Hall  Procedure(s) Performed: NEPHROLITHOTOMY PERCUTANEOUS (Right: Flank) HOLMIUM LASER APPLICATION (Right) CYSTOSCOPY/URETEROSCOPY/HOLMIUM LASER/STENT PLACEMENT (Left)     Patient location during evaluation: PACU Anesthesia Type: General Level of consciousness: awake and alert Pain management: pain level controlled Vital Signs Assessment: post-procedure vital signs reviewed and stable Respiratory status: spontaneous breathing, nonlabored ventilation, respiratory function stable and patient connected to nasal cannula oxygen Cardiovascular status: blood pressure returned to baseline and stable Postop Assessment: no apparent nausea or vomiting Anesthetic complications: no   No notable events documented.  Last Vitals:  Vitals:   07/24/21 1730 07/24/21 1757  BP: 110/73 (!) 95/56  Pulse: 79 78  Resp: 17 18  Temp:  (!) 36.4 C  SpO2: 93% 99%    Last Pain:  Vitals:   07/24/21 1757  TempSrc: Oral  PainSc:                  [name]

## 2021-07-24 NOTE — Interval H&P Note (Signed)
History and Physical Interval Note:  07/24/2021 12:20 PM  Michele Hall  has presented today for surgery, with the diagnosis of RIGHT RENAL CALCULI LEFT UTERAL STONE.  The various methods of treatment have been discussed with the patient and family. After consideration of risks, benefits and other options for treatment, the patient has consented to  Procedure(s): NEPHROLITHOTOMY PERCUTANEOUS (Right) HOLMIUM LASER APPLICATION (Right) CYSTOSCOPY/URETEROSCOPY/HOLMIUM LASER/STENT PLACEMENT (Left) as a surgical intervention.  The patient's history has been reviewed, patient examined, no change in status, stable for surgery.  I have reviewed the patient's chart and labs.  Questions were answered to the patient's satisfaction.     [name] Artem Bunte

## 2021-07-24 NOTE — Op Note (Signed)
Operative diagnosis: Left upper ureteral calculus (6 mm), right upper and lower pole calculi, large volume (upper pole stone 22 mm in size)  Postoperative diagnosis: Same  Principal procedure: Cystoscopy, left retrograde ureteropyelogram, left ureteroscopy, extraction of left renal calculi, placement of 6 French by 26 cm contour double-J stent on the left without tether, right percutaneous nephrolithotomy-fragmentation and removal of all visible right lower pole calculi, fragmentation and partial removal of right upper pole calculi, placement of 6 French by 26 cm contour double-J stent on right without tether, fluoroscopic interpretation, antegrade nephrostogram  Surgeon: Shamyra Farias  Anesthesia: General endotracheal  Specimen: Stone fragments  Drains: 33 French council tip catheter as right nephrostomy tube  Estimated blood loss: Estimated 1000 mL  Indications: 56 year old female with history of urolithiasis.  She has right upper and lower pole renal calculi with the largest dimension being the right upper pole from 22 mm.  She recently has had a symptomatic contralateral/left upper ureteral stone with flank pain.  That stone remains in the same place.  She presents at this time for management of her left upper ureteral stone with ureteroscopic management with laser and percutaneous management of her large right renal stone burden.  I have discussed the procedure with the patient, the possibility of staged procedure, need for percutaneous access through interventional radiology, the complications including blood loss, need for transfusion, infection, renal injury, anesthetic complications, the need for stents, among others.  She understands and desires to proceed.  Findings: On cystoscopy, the bladder appeared normal.  Retrograde study of the left ureter revealed a filling defect approximately 5 cm from the ureteropelvic junction with proximal mild hydronephrosis.  No other filling defects noted.   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  There was 1 small stone in the lower pole calyx and the stone which we went after in the upper pole region into the kidney on the left.  Description of procedure: The patient was properly identified in the holding area following placement of an interpolar nephrostomy tube by Dr. Serafina Royals in interventional radiology.  She had received preoperative IV antibiotics in the radiology suite.  She was then taken to the operating room where general endotracheal anesthetic was administered.  She was placed in the dorsolithotomy position.  Genitalia and perineum were prepped, draped, proper timeout performed.  21 French panendoscope advanced into the bladder and after systematic inspection of the urothelium of the bladder the left ureteral orifice was cannulated with a 6 [place] open-ended catheter.  Retrograde study was performed using Omnipaque.  The above-mentioned findings were noted.  I then easily advanced a sensor tip guidewire by the stone and to the upper pole calyceal system where fluoroscopic inspection revealed a curl of the guidewire.  The open-ended catheter and cystoscope were removed.  The ureter was sequentially dilated first with the 11 French obturator and then the entire 11/13 medium length ureteral access catheter.  The semirigid dual-lumen ureteroscope was easily advanced up into the upper ureter.  The stone was not identified, but the old area where the stone impacted was identified.  The stone had rinsed up into the bladder.  Over the guidewire I then replaced the ureteral access catheter.  The obturator was removed.  I passed a 6 French flexible digital dual-lumen ureteroscope and inspected the entire left kidney.  There were multiple Randall's plaques noted.  A small 2 to 3 mm stone was present in the lower pole calyx.  This was grasped with the engage basket and  removed.  The larger stone was grasped.  It could not initially be brought  through the access catheter, but fragmented into a couple pieces which were then easily brought out through the access catheter.  Inspection of the pyelocalyceal system revealed no further urolithiasis/calculus disease.  At this point the scope was removed.  Over top of the indwelling guidewire, after the access catheter had been removed, a 6 [place] by 26 cm contour double-J stent was easily passed into position.  Once adequately positioned, it was deployed with removal of the guidewire with excellent proximal and distal curl seen using fluoroscopy and cystoscopy, respectively.  At this time the scope was removed.  I then passed a 65 French Foley catheter and filled the balloon with 10 cc of water.  At this point that procedure was terminated.  The patient was eventually transferred to the prone position.  All extremities and pressure points were padded appropriately.  The right flank was prepped around the indwelling Kumpe catheter used as a nephrostomy tube.  The patient was then properly dressed/draped.  A second timeout was then performed.  Through the Kumpe catheter I passed a Super Stiff Bentson wire under fluoroscopic guidance into the bladder.  Once a good curl was seen there, the Kumpe catheter was removed.  I then passed the 10 French peel-away access catheter over top of the Bentson guidewire.  The core was removed and a second guidewire passed into the bladder.  Peel-away sheath was then removed.  Incision was made along the median aspect of the guidewires.  Subcutaneous tissue dissected with hemostat.  I then passed the NephroMax balloon up to the area of the renal pelvis using fluoroscopic guidance.  Balloon filled to 20 atm of pressure for 1 minute.  I then passed the access sheath over top of the inflated balloon into the renal pelvis.  Balloon was deflated and removed.  I then passed the rigid nephroscope into the renal pelvis.  Clots were removed.  No specific stone matter was seen within the  renal pelvis.  I had detorsed the nephroscope significantly get to access the lower pole calyx where the solitary lower pole stone was noted.  Once I found the stone, I used the trilogy lithotrite to aspirate the stone using the harmonic and suction settings.  All fragments were removed, with checking being done visually and with fluoroscopy.  1 small fragment which flipped into the pelvis was extracted.  At this point, I tried getting in the upper pole calyx with the rigid nephroscope.  This was impossible.  There was a fair amount of bleeding during the whole procedure which made identification of calyces fairly difficult.  However at this point, I passed the flexible cystoscope nephroscope, and easily access the upper pole calyx where the stone material was seen.  I passed the 365 m laser fiber and fragmented the large stone into multiple smaller fragments.  Quite a few of these were grasped with the encompass basket and removed.  However, during the extensive lithotripsy of the stone, eventually was quite difficult to see due to bleeding which obscured the field.  At this point using fluoroscopy I could see that the stone was significantly fragmented, and being in the upper pole I thought that it was worthwhile at this point to back out of the procedure because of the bleeding, place nephrostomy tube, and eventually let all of the fragments pass by gravity along the eventual stent that was placed.  Once all visible fragments were  removed, I passed a 26 cm x 6 French contour double-J stent antegrade down the right ureter and into the bladder using fluoroscopic guidance.  Once adequately positioned, the guidewire was removed and excellent proximal and distal curls were seen using fluoroscopy and nephroscopy, respectively.  The safety guidewire was left in at this point in the nephrostomy sheath removed.  I then passed a 67 [place] (18 [place] council tip catheters were not available) council tip catheter and using  fluoroscopic guidance past the into the region of the renal pelvis.  Once adequately positioned, contrast was utilized to perform a nephrogram.  This revealed no real passage of contrast distally, but did light up the pyelocalyceal system showing adequate positioning of the catheter.  The balloon was filled with approximately 3 cc of contrast/water.  This showed adequate positioning of the balloon within one of the calyces.  I then easily irrigated the nephrostomy catheter.  There was easy irrigation and aspiration.  The catheter was sutured to the skin using 0 silk.  2 separate vertical mattress sutures were placed closing the incision around the catheter.  Pressure was held on the nephrostomy tube site for approximately 2 minutes.  There was no further bleeding around the catheter at this time.  Dry sterile dressing was placed.  The catheter was hooked to dependent drainage.  At this point the procedure was terminated.  The patient was awakened, extubated, taken to the PACU in stable condition having tolerated procedure well.

## 2021-07-24 NOTE — Transfer of Care (Signed)
Immediate Anesthesia Transfer of Care Note  Patient: Michele Hall. Schuyler  Procedure(s) Performed: NEPHROLITHOTOMY PERCUTANEOUS (Right: Flank) HOLMIUM LASER APPLICATION (Right) CYSTOSCOPY/URETEROSCOPY/HOLMIUM LASER/STENT PLACEMENT (Left)  Patient Location: PACU  Anesthesia Type:General  Level of Consciousness: awake  Airway & Oxygen Therapy: Patient Spontanous Breathing and Patient connected to face mask oxygen  Post-op Assessment: Report given to RN and Post -op Vital signs reviewed and stable  Post vital signs: Reviewed and stable  Last Vitals:  Vitals Value Taken Time  BP 89/57 07/24/21 1615  Temp 36.6 C 07/24/21 1612  Pulse 95 07/24/21 1617  Resp 22 07/24/21 1617  SpO2 99 % 07/24/21 1617  Vitals shown include unvalidated device data.  Last Pain:  Vitals:   07/24/21 1612  TempSrc:   PainSc: Asleep         Complications: No notable events documented.

## 2021-07-24 NOTE — Anesthesia Preprocedure Evaluation (Addendum)
Anesthesia Evaluation  Patient identified by MRN, date of birth, ID band Patient awake    Reviewed: Allergy & Precautions, NPO status , Patient's Chart, lab work & pertinent test results  History of Anesthesia Complications Negative for: history of anesthetic complications  Airway Mallampati: II  TM Distance: >3 FB Neck ROM: Full    Dental  (+) Teeth Intact, Dental Advisory Given   Pulmonary former smoker,    Pulmonary exam normal breath sounds clear to auscultation       Cardiovascular hypertension, Pt. on medications (-) anginaNormal cardiovascular exam Rhythm:Regular Rate:Normal     Neuro/Psych negative neurological ROS  negative psych ROS   GI/Hepatic Neg liver ROS, GERD  ,  Endo/Other  Morbid obesity  Renal/GU RIGHT RENAL CALCULI LEFT UTERAL STONE     Musculoskeletal negative musculoskeletal ROS (+)   Abdominal   Peds  Hematology negative hematology ROS (+)   Anesthesia Other Findings Day of surgery medications reviewed with the patient.  Reproductive/Obstetrics                            Anesthesia Physical Anesthesia Plan  ASA: 3  Anesthesia Plan: General   Post-op Pain Management: Tylenol PO (pre-op)*   Induction: Intravenous  PONV Risk Score and Plan: 3 and Midazolam, Dexamethasone and Ondansetron  Airway Management Planned: Oral ETT  Additional Equipment:   Intra-op Plan:   Post-operative Plan: Extubation in OR  Informed Consent: I have reviewed the patients History and Physical, chart, labs and discussed the procedure including the risks, benefits and alternatives for the proposed anesthesia with the patient or authorized representative who has indicated his/her understanding and acceptance.     Dental advisory given  Plan Discussed with: CRNA  Anesthesia Plan Comments:         Anesthesia Quick Evaluation

## 2021-07-24 NOTE — Discharge Instructions (Signed)
DISCHARGE INSTRUCTIONS FOR PERCUTANEOUS STONE SURGERY MEDICATIONS:  1. DO NOT RESUME YOUR IBUPROFEN, or any other medicines like aspirin, motrin, excedrin, advil, aleve, vitamin E, fish oil as these can all cause bleeding x 10 days.  2. Resume all your other meds from home - except do not take any other pain meds that you may have at home.  ACTIVITY 1. No strenuous activity, sexual activity, or lifting greater than 10 pounds for 2 weeks. 2. No driving while on narcotic pain medications 3. Drink plenty of water 4. Continue to walk at home - you can still get blood clots when you are at home, so keep active, but don't over do it. 5. May return to work in 1 week (but not heavy or strenuous activity).   BATHING You can shower.  Cover your wound with a dressing and remove the dressing immediately after the shower.  Do not submerge wound under water.   WOUND CARE Your wound will drain bloody fluid and may do so for 7-14 days. You have 2 options for dressings:  1. You may use gauze and tape to dress your wound.  If you choose this method, then change the dressing as it becomes soaked.  Change it at least once daily until it stops draining. You may switch to a Band Aid once drainage stops. 2. If drainage is copious, you may use an ostomy device.  This is a bag with an andhesive circle.  The circle has a hole in the middle of it and you cut the hole to the size needed to fit the wound.  This will collect the drainage in the bag and allow you to drain the bag as needed.   SIGNS/SYMPTOMS TO CALL: Please call us if you have a fever greater than 101.5, uncontrolled nausea/vomiting, uncontrolled pain, dizziness, unable to urinate, bloody urine, chest pain, shortness of breath, leg swelling, leg pain, redness around wound, drainage from wound, or any other concerns or questions. You can reach us at [phone].   

## 2021-07-24 NOTE — Anesthesia Procedure Notes (Signed)
Procedure Name: Intubation Date/Time: 07/24/2021 1:15 PM Performed by: Lavina Hamman, CRNA Pre-anesthesia Checklist: Patient identified, Emergency Drugs available, Suction available, Patient being monitored and Timeout performed Patient Re-evaluated:Patient Re-evaluated prior to induction Oxygen Delivery Method: Circle system utilized Preoxygenation: Pre-oxygenation with 100% oxygen Induction Type: IV induction Ventilation: Mask ventilation without difficulty and Two handed mask ventilation required Laryngoscope Size: Mac and 4 Grade View: Grade III Tube type: Oral Tube size: 7.0 mm Number of attempts: 2 Airway Equipment and Method: Stylet Placement Confirmation: ETT inserted through vocal cords under direct vision, positive ETCO2, CO2 detector and breath sounds checked- equal and bilateral Secured at: 23 cm Tube secured with: Tape Dental Injury: Teeth and Oropharynx as per pre-operative assessment  Difficulty Due To: Difficulty was anticipated, Difficult Airway- due to large tongue and Difficult Airway- due to anterior larynx Comments: ATOI, SRNA attempt but no ett pass.  Dr Gifford Shave G3 view, easy pass 1st attempt.

## 2021-07-24 NOTE — Interval H&P Note (Signed)
History and Physical Interval Note:  07/24/2021 12:19 PM  Michele Hall  has presented today for surgery, with the diagnosis of RIGHT RENAL CALCULI LEFT UTERAL STONE.  The various methods of treatment have been discussed with the patient and family. After consideration of risks, benefits and other options for treatment, the patient has consented to  Procedure(s): NEPHROLITHOTOMY PERCUTANEOUS (Right) HOLMIUM LASER APPLICATION (Right) CYSTOSCOPY/URETEROSCOPY/HOLMIUM LASER/STENT PLACEMENT (Left) as a surgical intervention.  The patient's history has been reviewed, patient examined, no change in status, stable for surgery.  I have reviewed the patient's chart and labs.  Questions were answered to the patient's satisfaction.     [name] Kenichi Cassada

## 2021-07-24 NOTE — Consult Note (Signed)
Chief Complaint: Patient was seen in consultation today for right percutaneous nephrostomy/nephroureteral catheter placement  Referring Physician(s): Dahlstedt,S  Supervising Physician: Ruthann Cancer  Patient Status: [name] - Out-pt TBA  History of Present Illness: Michele Hall. Michele Hall is a 56 y.o. female with past medical history of cholelithiasis, GERD, hypertension, obesity, and nephrolithiasis.  Recent CT reveals bilateral renal stones measuring up to 19 mm on the right and 4 mm on the left well as stone within the posterior aspect of the left UP junction dimension measuring up to 5 mm in transverse dimension and 7 mm in craniocaudal appearing to be partially obstructing with mild upstream pelvocaliectasis, unchanged from previous.  She presents today for right percutaneous nephrostomy/nephroureteral catheter placement prior to nephrolithotomy.  Past Medical History:  Diagnosis Date   Anal fissure - posterior 2015   Gallstones    GERD (gastroesophageal reflux disease)    History of kidney stones    Hypertension    Morbid obesity ([name])     Past Surgical History:  Procedure Laterality Date   CHOLECYSTECTOMY  1988   CYSTOSCOPY WITH RETROGRADE PYELOGRAM, URETEROSCOPY AND STENT PLACEMENT Left 04/18/2020   Procedure: CYSTOSCOPY WITH RETROGRADE PYELOGRAM, URETEROSCOPY , STONE EXTRACTION AND STENT PLACEMENT;  Surgeon: Franchot Gallo, MD;  Location: [facility];  Service: Urology;  Laterality: Left;  75 MINS   DILITATION & CURRETTAGE/HYSTROSCOPY WITH HYDROTHERMAL ABLATION N/A 09/13/2012   Procedure: DILATATION & CURETTAGE/HYSTEROSCOPY WITH HYDROTHERMAL ABLATION;  Surgeon: Osborne Oman, MD;  Location: [facility];  Service: Gynecology;  Laterality: N/A;   HOLMIUM LASER APPLICATION Left 40/03/2724   Procedure: HOLMIUM LASER LITHOTRIPSY;  Surgeon: Franchot Gallo, MD;  Location: [facility];  Service: Urology;  Laterality: Left;   TUBAL LIGATION  1990    Allergies: Requip [ropinirole  hcl]  Medications: Prior to Admission medications   Medication Sig Start Date End Date Taking? Authorizing Provider  aspirin 81 MG chewable tablet Chew 81 mg by mouth daily.   Yes [provider]  fexofenadine (ALLEGRA) 180 MG tablet TAKE 1 TABLET BY MOUTH EVERY DAY 07/10/21  Yes Bedsole, Amy E, MD  Probiotic Product (DIGESTIVE ADVANTAGE) CAPS Take 1 capsule by mouth daily.   Yes [provider]  spironolactone (ALDACTONE) 50 MG tablet TAKE 1 TABLET BY MOUTH EVERY DAY 05/30/21  Yes Bedsole, Amy E, MD     Family History  Problem Relation Age of Onset   Stroke Mother    Kidney disease Father    Coronary artery disease Father    Aneurysm Father        AAA   Hypertension Father    Seizures Sister    Heart attack Maternal Grandfather    Cancer Sister        ovarian/uterus   Tongue cancer Sister    Colon cancer Neg Hx    Stomach cancer Neg Hx    Urticaria Neg Hx    Immunodeficiency Neg Hx    Eczema Neg Hx    Atopy Neg Hx    Asthma Neg Hx    Angioedema Neg Hx    Allergic rhinitis Neg Hx     Social History   Socioeconomic History   Marital status: Married    Spouse name: Not on file   Number of children: 2   Years of education: Not on file   Highest education level: Not on file  Occupational History   Occupation: house cleaner/ former bus driver    Employer: UNEMPLOYED  Tobacco Use   Smoking status: Former  Packs/day: 0.25    Years: 5.00    Pack years: 1.25    Types: Cigarettes    Quit date: 02/09/2002    Years since quitting: 19.4   Smokeless tobacco: Never  Vaping Use   Vaping Use: Never used  Substance and Sexual Activity   Alcohol use: No   Drug use: No   Sexual activity: Yes    Partners: Male    Birth control/protection: Surgical  Other Topics Concern   Not on file  Social History Narrative   Married, 2 sons   Essentially no caffeine         Social Determinants of Health   Financial Resource Strain: Not on file  Food Insecurity:  Not on file  Transportation Needs: Not on file  Physical Activity: Not on file  Stress: Not on file  Social Connections: Not on file      Review of Systems denies fever, headache, chest pain, dyspnea, cough, nausea, vomiting or bleeding; does have some chronic back pain and recently some right lower quadrant discomfort.  Vital Signs: Ht 5\' 7"  (1.702 m)    Wt 263 lb (119.3 kg)    BMI 41.19 kg/m   Physical Exam awake, alert.  Chest clear to auscultation bilaterally.  Heart with sl tachycardic but regular rhythm.  Abdomen soft, positive bowel sounds, currently nontender.  No significant lower extremity edema.  Imaging: DG Abd 1 View  Result Date: 07/24/2021 CLINICAL DATA:  Left ureteral stone. EXAM: ABDOMEN - 1 VIEW COMPARISON:  CT abdomen pelvis dated July 08, 2021. FINDINGS: Unchanged right renal calculi measuring up to 1.8 cm. Unchanged multiple small left renal calculi. Unchanged 5 mm calculus near the left UPJ. Normal bowel gas pattern. Prior cholecystectomy. No acute osseous abnormality. IMPRESSION: 1. Unchanged 5 mm left UPJ calculus. 2. Unchanged bilateral nephrolithiasis. Electronically Signed   By: Titus Dubin M.D.   On: 07/24/2021 08:35   MR BRAIN WO CONTRAST  Result Date: 07/09/2021 CLINICAL DATA:  Neuro deficit, stroke suspected EXAM: MRI HEAD WITHOUT CONTRAST TECHNIQUE: Multiplanar, multiecho pulse sequences of the brain and surrounding structures were obtained without intravenous contrast. COMPARISON:  None. FINDINGS: Brain: No restricted diffusion to suggest acute or subacute infarct. No acute hemorrhage, mass, mass effect, or midline shift. No foci of hemosiderin deposition to suggest remote hemorrhage. No hydrocephalus or extra-axial collection. Vascular: Normal flow voids. Skull and upper cervical spine: Normal marrow signal. Sinuses/Orbits: Negative. Other: The mastoids are well aerated. IMPRESSION: No acute intracranial process. Electronically Signed   By: Merilyn Baba M.D.   On: 07/09/2021 00:42   MR LUMBAR SPINE WO CONTRAST  Result Date: 07/08/2021 CLINICAL DATA:  Low back pain, cauda equina syndrome suspected EXAM: MRI LUMBAR SPINE WITHOUT CONTRAST TECHNIQUE: Multiplanar, multisequence MR imaging of the lumbar spine was performed. No intravenous contrast was administered. COMPARISON:  No prior MRI, correlation is made with same day CT abdomen pelvis and CT abdomen pelvis 04/10/2020 FINDINGS: Segmentation:  Standard. Alignment: S shaped curvature of the thoracolumbar spine, with mild levocurvature of the lumbar spine. Straightening of the normal cervical lordosis. 5 mm retrolisthesis L5 on S1, which appears unchanged compared to the 2021 prior CT. Vertebrae:  No acute fracture or suspicious osseous abnormality. Conus medullaris and cauda equina: Conus extends to the L1 level. Conus and cauda equina appear normal. Paraspinal and other soft tissues: Fatty atrophy of the majority of the imaged paraspinous muscles, left-greater-than-right. Normal iliopsoas muscle bulk Disc levels: T12-L1: No significant disc bulge. Mild facet arthropathy.  No spinal canal stenosis or neural foraminal narrowing. L1-L2: Disc desiccation and mild disc bulge. Mild facet arthropathy. No spinal canal stenosis or neural foraminal narrowing. L2-L3: Minimal disc bulge. Mild facet arthropathy. No spinal canal stenosis or neural foraminal narrowing. L3-L4: Mild disc bulge. Moderate facet arthropathy. No spinal canal stenosis. Mild bilateral neural foraminal narrowing. L4-L5: Mild disc bulge. Moderate facet arthropathy. Narrowing of the bilateral lateral recesses. No spinal canal stenosis. Moderate left-greater-than-right neural foraminal narrowing. L5-S1: Retrolisthesis with mild disc bulge with central annular fissure. Mild-to-moderate facet arthropathy. Narrowing of the lateral recesses. No spinal canal stenosis. Severe left and moderate to severe right neural foraminal narrowing. Mild enlargement  of the left extraforaminal L5 nerve, with increased signal on T2 weighted sequences (series 8, image 43), compared to the right L5 nerve. IMPRESSION: 1. No evidence of cauda equina syndrome. No significant spinal canal stenosis in the lumbar spine. 2. L5-S1 severe left and moderate to severe right neural foraminal narrowing. Narrowing of the lateral recesses at this level could affect the descending S1 nerve roots. In addition, mild enlargement of the extraforaminal left L5 nerve, with increased T2 hyperintense signal may indicate compressive myelopathy. 3. L4-L5 moderate left-greater-than-right neural foraminal narrowing. Narrowing of the lateral recesses at this level could affect the descending L5 nerve roots. 4. L3-L4 mild bilateral neural foraminal narrowing. Electronically Signed   By: Merilyn Baba M.D.   On: 07/08/2021 22:25   CT ABDOMEN PELVIS W CONTRAST  Result Date: 07/08/2021 CLINICAL DATA:  Right lower quadrant pain. Known 18 mm stone. Positive Rovsing's sign. EXAM: CT ABDOMEN AND PELVIS WITH CONTRAST TECHNIQUE: Multidetector CT imaging of the abdomen and pelvis was performed using the standard protocol following bolus administration of intravenous contrast. RADIATION DOSE REDUCTION: This exam was performed according to the departmental dose-optimization program which includes automated exposure control, adjustment of the mA and/or kV according to patient size and/or use of iterative reconstruction technique. CONTRAST:  154mL OMNIPAQUE IOHEXOL 300 MG/ML  SOLN COMPARISON:  CT abdomen and pelvis 06/26/2021 FINDINGS: Lower chest: Mild curvilinear subsegmental atelectasis and/or scarring within the lingula and left lower lobe greater than the middle lobe, similar to prior 06/26/2021 CT. Hepatobiliary: Smooth liver contours. Diffuse decreased density is seen throughout the liver suggesting fatty infiltration. No focal liver lesion is seen. Status post cholecystectomy. No intrahepatic or extrahepatic  biliary ductal dilatation. Pancreas: No mass or inflammatory fat stranding. No pancreatic ductal dilatation is seen. Spleen: A small splenule is again seen just inferior to the spleen. Adrenals/Urinary Tract: Adrenal glands are unremarkable. The kidneys enhance uniformly and are symmetric in size without hydronephrosis. 19 mm right renal midpole stone is not significantly changed from 06/26/2021. Left upper pole 3 mm stone (axial image 31. Left lower pole 4 mm stone (axial image 46). Additional smaller punctate left renal stones. There is again a stone measuring up to 5 mm in transverse dimension and 7 mm in craniocaudal dimension within the posterior aspect of the left ureteropelvic junction with mild upstream pelvicaliectasis, likely partially obstructing. No new stone is seen within the left ureter. No right hydronephrosis. No focal urinary bladder wall thickening. Stomach/Bowel: Moderate stool is seen throughout the colon. No focal bowel wall thickening is seen the terminal ileum is unremarkable. The appendix is normal in appearance (axial series 2 images 66 through 69). No dilated loops of bowel to indicate bowel obstruction. Vascular/Lymphatic: No abdominal aortic aneurysm. Reproductive: The uterus is present and unremarkable. No adnexal mass is seen. Other: Small fat containing umbilical hernia.No  abdominopelvic ascites. No pneumoperitoneum. Musculoskeletal: Mild multilevel degenerative disc changes of the visualized thoracolumbar spine. IMPRESSION:: IMPRESSION: 1. Normal appendix. 2. Redemonstration of bilateral renal stones measuring up to 19 mm on the right and 4 mm on the left. There is again a stone within the posterior aspect of the left ureteropelvic junction dimension, measuring up to 5 mm in transverse dimension and 7 mm in craniocaudal appearing to be partially obstructing, with mild upstream pelvocaliectasis. This is unchanged from prior. 3.  Moderate stool throughout the colon. 4. Fatty liver.  Electronically Signed   By: Yvonne Kendall M.D.   On: 07/08/2021 18:48   US PELVIC COMPLETE W TRANSVAGINAL AND TORSION R/O  Result Date: 07/08/2021 CLINICAL DATA:  Pelvic pain for 2 days. EXAM: TRANSABDOMINAL AND TRANSVAGINAL ULTRASOUND OF PELVIS DOPPLER ULTRASOUND OF OVARIES TECHNIQUE: Both transabdominal and transvaginal ultrasound examinations of the pelvis were performed. Transabdominal technique was performed for global imaging of the pelvis including uterus, ovaries, adnexal regions, and pelvic cul-de-sac. It was necessary to proceed with endovaginal exam following the transabdominal exam to visualize the anatomy. Color and duplex Doppler ultrasound was utilized to evaluate blood flow to the ovaries. COMPARISON:  08/03/2012. FINDINGS: Uterus Measurements: 6.9 x 4.1 x 5.7 cm = volume: 84.5 mL. Fibroids are noted at the uterine fundus measuring 1.3 x 1.1 x 1.1 cm and 1.5 x 1.3 x 1.6 cm. Endometrium Thickness: 3.6 mm.  No focal abnormality visualized. Right ovary Not visualized on transabdominal or transvaginal imaging. Left ovary Measurements: 2.2 x 1.8 x 1.9 cm = volume: 1.8 mL. Not well seen due to patient's body habitus Pulsed Doppler evaluation of both ovaries is limited due to nonvisualization of the right ovary and limited evaluation of the left ovary due to patient's body habitus. Other findings No abnormal free fluid. IMPRESSION: 1. Limited evaluation due to patient's body habitus. The right ovary is not visualized on exam and there is suboptimal evaluation flow in the left ovary. The need for repeat evaluation should be determined clinically. 2. Uterine fibroids. Electronically Signed   By: Brett Fairy M.D.   On: 07/08/2021 20:55   LE VENOUS  Result Date: 07/09/2021  Lower Venous DVT Study Patient Name:  MYRTA MERCER. Lavoy  Date of Exam:   07/09/2021 Medical Rec #: [id number]       Accession #:    [id number] Date of Birth: [birth year]       Patient Gender: F Patient Age:   30 years Exam Location:  [facility] Procedure:      VAS [place] LOWER EXTREMITY VENOUS (DVT) Referring Phys: Gerlene Fee --------------------------------------------------------------------------------  Indications: Right groin pain.  Comparison Study: No prior study Performing Technologist: Maudry Mayhew MHA, RDMS, RVT, RDCS  Examination Guidelines: A complete evaluation includes B-mode imaging, spectral Doppler, color Doppler, and power Doppler as needed of all accessible portions of each vessel. Bilateral testing is considered an integral part of a complete examination. Limited examinations for reoccurring indications may be performed as noted. The reflux portion of the exam is performed with the patient in reverse Trendelenburg.  +---------+---------------+---------+-----------+----------+--------------+  RIGHT     Compressibility Phasicity Spontaneity Properties Thrombus Aging  +---------+---------------+---------+-----------+----------+--------------+  CFV       Full            Yes       Yes                                    +---------+---------------+---------+-----------+----------+--------------+  SFJ       Full                                                             +---------+---------------+---------+-----------+----------+--------------+  FV Prox   Full                                                             +---------+---------------+---------+-----------+----------+--------------+  FV Mid    Full                                                             +---------+---------------+---------+-----------+----------+--------------+  FV Distal Full                                                             +---------+---------------+---------+-----------+----------+--------------+  PFV       Full                                                             +---------+---------------+---------+-----------+----------+--------------+  POP       Full            Yes       Yes                                     +---------+---------------+---------+-----------+----------+--------------+  PTV       Full                                                             +---------+---------------+---------+-----------+----------+--------------+  PERO      Full                                                             +---------+---------------+---------+-----------+----------+--------------+   +----+---------------+---------+-----------+----------+--------------+  LEFT Compressibility Phasicity Spontaneity Properties Thrombus Aging  +----+---------------+---------+-----------+----------+--------------+  CFV  Full            Yes       Yes                                    +----+---------------+---------+-----------+----------+--------------+  Summary: RIGHT: - There is no evidence of deep vein thrombosis in the lower extremity.  - No cystic structure found in the popliteal fossa.  LEFT: - No evidence of common femoral vein obstruction.  *See table(s) above for measurements and observations. Electronically signed by Jamelle Haring on 07/09/2021 at 3:54:00 PM.    Final     Labs:  CBC: Recent Labs    01/15/21 1132 07/08/21 1507  WBC 5.9 8.0  HGB 15.2 15.3*  HCT 45.6 43.7  PLT 259 208    COAGS: No results for input(s): INR, APTT in the last 8760 hours.  BMP: Recent Labs    01/15/21 1132 07/08/21 1507  NA 141 139  K 4.4 3.9  CL 105 108  CO2 21 22  GLUCOSE 79 83  BUN 13 14  CALCIUM 9.2 8.8*  CREATININE 0.80 0.70  GFRNONAA  --  >60    LIVER FUNCTION TESTS: Recent Labs    01/15/21 1132 07/08/21 1507  BILITOT 0.5 0.5  AST 30 29  ALT 34* 35  ALKPHOS 63 60  PROT 7.3 7.9  ALBUMIN 4.4 4.2    TUMOR MARKERS: No results for input(s): AFPTM, CEA, CA199, CHROMGRNA in the last 8760 hours.  Assessment and Plan: 56 y.o. female with past medical history of cholelithiasis, GERD, hypertension, obesity, and nephrolithiasis.  Recent CT reveals bilateral renal stones measuring up to 19 mm on the right and 4  mm on the left well as stone within the posterior aspect of the left UP junction dimension measuring up to 5 mm in transverse dimension and 7 mm in craniocaudal appearing to be partially obstructing with mild upstream pelvocaliectasis, unchanged from previous.  She presents today for right percutaneous nephrostomy/nephroureteral catheter placement prior to nephrolithotomy.Risks and benefits of right PCN placement was discussed with the patient including, but not limited to, infection, bleeding, significant bleeding causing loss or decrease in renal function or damage to adjacent structures.   All of the patient's questions were answered, patient is agreeable to proceed.  Consent signed and in chart.      Thank you for this interesting consult.  I greatly enjoyed meeting Jaylene Arrowood. Lara and look forward to participating in their care.  A copy of this report was sent to the requesting provider on this date.  Electronically Signed: D. Rowe Robert, PA-C 07/24/2021, 8:49 AM   I spent a total of  25 minutes   in face to face in clinical consultation, greater than 50% of which was counseling/coordinating care for right percutaneous nephrostomy/nephroureteral catheter placement

## 2021-07-25 ENCOUNTER — Encounter (HOSPITAL_COMMUNITY): Payer: Self-pay | Admitting: Urology

## 2021-07-25 DIAGNOSIS — N202 Calculus of kidney with calculus of ureter: Secondary | ICD-10-CM | POA: Diagnosis not present

## 2021-07-25 LAB — HEMOGLOBIN AND HEMATOCRIT, BLOOD
HCT: 30.7 % — ABNORMAL LOW (ref 36.0–46.0)
Hemoglobin: 10.5 g/dL — ABNORMAL LOW (ref 12.0–15.0)

## 2021-07-25 LAB — HIV ANTIBODY (ROUTINE TESTING W REFLEX): HIV Screen 4th Generation wRfx: NONREACTIVE

## 2021-07-25 MED ORDER — OXYCODONE HCL 5 MG PO TABS: 5.0000 mg | ORAL_TABLET | Freq: Three times a day (TID) | ORAL | 0 refills | Status: DC | PRN

## 2021-07-25 MED ORDER — SULFAMETHOXAZOLE-TRIMETHOPRIM 800-160 MG PO TABS: 1.0000 | ORAL_TABLET | Freq: Two times a day (BID) | ORAL | 0 refills | Status: DC

## 2021-07-25 MED ORDER — OXYBUTYNIN CHLORIDE 5 MG PO TABS: 5.0000 mg | ORAL_TABLET | Freq: Three times a day (TID) | ORAL | 1 refills | Status: DC | PRN

## 2021-07-25 NOTE — Discharge Summary (Signed)
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   Routine general medical examination at a health care facility 01/22/2019   Allergic contact dermatitis 11/15/2018   Posterior epistaxis 08/11/2018   Cervical cancer screening 12/02/2017   Allergic rhinitis due to allergen 04/04/2014   Anal fissure - posterior 09/06/2013   History of nephrolithiasis 10/27/2010   Vitamin B 12 deficiency 07/18/2010   RESTLESS LEG SYNDROME 05/07/2010   Hyperlipidemia 10/27/2006   OBESITY 10/27/2006   Essential hypertension, benign 10/27/2006   GERD 10/27/2006   IBS 10/27/2006    Procedures performed: Procedure(s): NEPHROLITHOTOMY PERCUTANEOUS HOLMIUM LASER APPLICATION CYSTOSCOPY/URETEROSCOPY/HOLMIUM LASER/STENT PLACEMENT  History and Physical: For full details, please see admission history and physical. Briefly, Michele Hall is a 56 y.o. year old patient with bilateral urolithiasis.   Hospital Course: Patient tolerated the procedure well.  She was then transferred to the floor after an uneventful PACU stay.  Her hospital course was uncomplicated.  On POD#1 she had met discharge criteria: was eating a regular diet, was up and ambulating independently,  pain was well controlled, was voiding without a catheter, and was ready to for discharge.  Prior to discharge her nephrostomy tube was removed.   Laboratory values:  Recent Labs    07/24/21 0844 07/24/21 1644  07/25/21 0331  WBC 6.8 14.7*  --   HGB 14.5 11.1* 10.5*  HCT 40.7 32.4* 30.7*   Recent Labs    07/24/21 0844  NA 140  K 3.6  CL 109  CO2 23  GLUCOSE 97  BUN 18  CREATININE 0.68  CALCIUM 9.0   Recent Labs    07/24/21 0844  INR 1.0   No results for input(s): LABURIN in the last 72 hours. Results for orders placed or performed during the hospital encounter of 07/22/21  SARS CORONAVIRUS 2 (TAT 6-24 HRS) Nasopharyngeal Nasopharyngeal Swab     Status: None   Collection Time: 07/22/21  7:01 AM   Specimen: Nasopharyngeal Swab  Result Value Ref Range Status   SARS Coronavirus 2 NEGATIVE NEGATIVE Final    Comment: (NOTE) SARS-CoV-2 target nucleic acids are NOT DETECTED.  The SARS-CoV-2 RNA is generally detectable in upper and lower respiratory specimens during the acute phase of infection. Negative results do not preclude SARS-CoV-2 infection, do not rule out co-infections with other pathogens, and should not be used as the sole basis for treatment or other patient management decisions. Negative results must be combined with clinical observations, patient history, and epidemiological information. The expected result is Negative.  Fact Sheet for Patients: SugarRoll.be  Fact Sheet for Healthcare Providers: https://www.woods-mathews.com/  This test is not yet approved or cleared by the [place] FDA and  has been authorized for detection and/or diagnosis of SARS-CoV-2 by FDA under an Emergency Use Authorization (EUA). This EUA will remain  in effect (meaning this test can be used) for the duration of the COVID-19 declaration under Se ction 564(b)(1) of the Act, 21 U.S.C. section 360bbb-3(b)(1), unless the authorization is terminated or revoked sooner.  Performed at [facility], [place] [address], [place], [place] [address]     Disposition: Home  Discharge instruction: The patient was instructed to be ambulatory but told  to refrain from heavy lifting, strenuous activity, or driving.   Discharge medications:  Allergies as of 07/25/2021       Reactions   Requip [ropinirole Hcl] Other (See Comments)   Headache, Feels "sick"        Medication List     STOP taking these medications    aspirin 81 MG chewable tablet       TAKE these medications    Digestive Advantage Caps Take 1 capsule by mouth daily.   fexofenadine 180 MG tablet Commonly known as: ALLEGRA TAKE 1 TABLET BY MOUTH EVERY DAY   oxybutynin 5 MG tablet Commonly known as: DITROPAN Take 1 tablet (5 mg total) by mouth every 8 (eight) hours as needed for bladder spasms.   oxyCODONE 5 MG immediate release tablet Commonly known as: Roxicodone Take 1 tablet (5 mg total) by mouth every 8 (eight) hours as needed.   spironolactone 50 MG tablet Commonly known as: ALDACTONE TAKE 1 TABLET BY MOUTH EVERY DAY   sulfamethoxazole-trimethoprim 800-160 MG tablet Commonly known as: BACTRIM DS Take 1 tablet by mouth 2 (two) times daily.        Followup:   Follow-up Information     Hollace Hayward, NP Follow up.   Why: 3.2.2023 @ 10 am Contact information: [address] [place] [address] [phone]

## 2021-07-25 NOTE — Progress Notes (Signed)
Urology Inpatient Progress Report  Kidney stone [N20.0]  Procedure(s): NEPHROLITHOTOMY PERCUTANEOUS HOLMIUM LASER APPLICATION CYSTOSCOPY/URETEROSCOPY/HOLMIUM LASER/STENT PLACEMENT  1 Day Post-Op   Intv/Subj: No acute events overnight. Some cramping in bladder, pain in right flank.  Treated with both oral and IV pain meds. Tolerating regular PO intake Foley removed at 5 am  Principal Problem:   Kidney stone  Current Facility-Administered Medications  Medication Dose Route Frequency Provider Last Rate Last Admin   0.45 % sodium chloride infusion   Intravenous Continuous Franchot Gallo, MD 75 mL/hr at 07/24/21 1806 New Bag at 07/24/21 1806   HYDROmorphone (DILAUDID) injection 0.5-1 mg  0.5-1 mg Intravenous Q2H PRN Franchot Gallo, MD   0.5 mg at 07/25/21 0517   loratadine (CLARITIN) tablet 10 mg  10 mg Oral Daily Franchot Gallo, MD   10 mg at 07/24/21 1810   ondansetron (ZOFRAN) injection 4 mg  4 mg Intravenous Q4H PRN Franchot Gallo, MD       oxybutynin (DITROPAN) tablet 5 mg  5 mg Oral Q8H PRN Franchot Gallo, MD       oxyCODONE (Oxy IR/ROXICODONE) immediate release tablet 5 mg  5 mg Oral Q4H PRN Franchot Gallo, MD   5 mg at 07/24/21 2224   senna (SENOKOT) tablet 8.6 mg  1 tablet Oral BID Franchot Gallo, MD   8.6 mg at 07/24/21 2104   spironolactone (ALDACTONE) tablet 50 mg  50 mg Oral Daily Franchot Gallo, MD   50 mg at 07/24/21 1811   sulfamethoxazole-trimethoprim (BACTRIM DS) 800-160 MG per tablet 1 tablet  1 tablet Oral Q12H Franchot Gallo, MD   1 tablet at 07/24/21 2104     Objective: Vital: Vitals:   07/24/21 1757 07/24/21 2043 07/25/21 0158 07/25/21 0613  BP: (!) 95/56 126/79 112/76 113/74  Pulse: 78 81 86 86  Resp: 18 20 20 20   Temp: (!) 97.5 F (36.4 C) 97.7 F (36.5 C) 98.2 F (36.8 C) 98.7 F (37.1 C)  TempSrc: Oral Oral Oral Oral  SpO2: 99% 98% 95% 96%  Weight:      Height:       I/Os: I/O last 3 completed shifts: In:  4235 [P.O.:480; I.V.:2900; IV Piggyback:250] Out: 1500 [Urine:500; Blood:1000]  Physical Exam:  General: Patient is in no apparent distress Lungs: Normal respiratory effort, chest expands symmetrically. GI: The abdomen is soft and nontender without mass. Neph tube capped. Foley: out  Ext: lower extremities symmetric  Lab Results: Recent Labs    07/24/21 0844 07/24/21 1644 07/25/21 0331  WBC 6.8 14.7*  --   HGB 14.5 11.1* 10.5*  HCT 40.7 32.4* 30.7*   Recent Labs    07/24/21 0844  NA 140  K 3.6  CL 109  CO2 23  GLUCOSE 97  BUN 18  CREATININE 0.68  CALCIUM 9.0   Recent Labs    07/24/21 0844  INR 1.0   No results for input(s): LABURIN in the last 72 hours. Results for orders placed or performed during the hospital encounter of 07/22/21  SARS CORONAVIRUS 2 (TAT 6-24 HRS) Nasopharyngeal Nasopharyngeal Swab     Status: None   Collection Time: 07/22/21  7:01 AM   Specimen: Nasopharyngeal Swab  Result Value Ref Range Status   SARS Coronavirus 2 NEGATIVE NEGATIVE Final    Comment: (NOTE) SARS-CoV-2 target nucleic acids are NOT DETECTED.  The SARS-CoV-2 RNA is generally detectable in upper and lower respiratory specimens during the acute phase of infection. Negative results do not preclude SARS-CoV-2 infection, do not rule  out co-infections with other pathogens, and should not be used as the sole basis for treatment or other patient management decisions. Negative results must be combined with clinical observations, patient history, and epidemiological information. The expected result is Negative.  Fact Sheet for Patients: SugarRoll.be  Fact Sheet for Healthcare Providers: https://www.woods-mathews.com/  This test is not yet approved or cleared by the [place] FDA and  has been authorized for detection and/or diagnosis of SARS-CoV-2 by FDA under an Emergency Use Authorization (EUA). This EUA will remain  in effect  (meaning this test can be used) for the duration of the COVID-19 declaration under Se ction 564(b)(1) of the Act, 21 U.S.C. section 360bbb-3(b)(1), unless the authorization is terminated or revoked sooner.  Performed at [facility], [place] [address] [place], [place] [address]     Studies/Results: DG Abd 1 View  Result Date: 07/24/2021 CLINICAL DATA:  Left ureteral stone. EXAM: ABDOMEN - 1 VIEW COMPARISON:  CT abdomen pelvis dated July 08, 2021. FINDINGS: Unchanged right renal calculi measuring up to 1.8 cm. Unchanged multiple small left renal calculi. Unchanged 5 mm calculus near the left UPJ. Normal bowel gas pattern. Prior cholecystectomy. No acute osseous abnormality. IMPRESSION: 1. Unchanged 5 mm left UPJ calculus. 2. Unchanged bilateral nephrolithiasis. Electronically Signed   By: Titus Dubin M.D.   On: 07/24/2021 08:35   IR US Guidance  Result Date: 07/24/2021 INDICATION: Renal stones, access for right percutaneous nephrolithotomy. EXAM: 1. FLUOROSCOPIC GUIDANCE FOR PUNCTURE OF THE right SIDED RENAL COLLECTING SYSTEM. 2. FLUOROSCOPIC GUIDED PLACEMENT OF A right SIDED NEPHROURETERAL CATHETER. 3. Intravenous pyelogram. COMPARISON:  CT of the abdomen and pelvis-07/08/2021 MEDICATIONS: Rocephin 2 gm IV; The antibiotic was administered in an appropriate time frame prior to skin puncture. ANESTHESIA/SEDATION: Moderate (conscious) sedation was employed during this procedure. A total of Versed 6 mg and Fentanyl 350 mcg was administered intravenously. Moderate Sedation Time: 115 minutes. The patient's level of consciousness and vital signs were monitored continuously by radiology nursing throughout the procedure under my direct supervision. CONTRAST:  Seventy mL Omnipaque 300-Administered into the renal collecting system. 80 mL Omnipaque 300 administered intravenously. FLUOROSCOPY TIME:  minutes  seconds ( mGy) COMPLICATIONS: None immediate. PROCEDURE: Informed written consent was obtained  from the patient after a discussion of the risks, benefits, and alternatives to treatment. The right flank region was prepped with chlorhexidine in a sterile fashion, and a sterile drape was applied covering the operative field. A sterile gown and sterile gloves were used for the procedure. A timeout was performed prior to the initiation of the procedure. A pre procedural spot fluoroscopic image was obtained of the upper abdomen. Ultrasound scanning performed of the kidney was negative for significant hydronephrosis. As such, the stone within right upper pole was targeted fluoroscopically with a [place] needle. Access to the collecting system was unable to be obtained with advancement of a Nitrex wire into the collecting system due to extensive stone burden in the peripheral aspect of the calyx. Given inability to visualize calices or lower pole nephrolithiasis sonographically, intravenous pyelogram was performed. Intravenous contrast was injected in approximately 4 minutes after injection there was faint opacification of the collecting system. The central collecting system was then punctured under direct fluoroscopic guidance with the [place] needle. Gentle hand injection of contrast demonstrated needle tip position within the collecting system. A Nitrex wire was inserted and advanced to the proximal ureter. The needle was exchanged for the inner 3 French catheter from an [place]  set. A combination of contrast and air was injected into the collecting system for better visualization. A small calyx adjacent to the lower pole renal calculus was opacified and targeted. Despite advancing the needle to the calyx, the Nitrex wire was unable to be advanced into the collecting system. Therefore, an additional peripheral calyx in the mid lower pole was targeted and access with a separate 21 gauge Chiba needle under direct fluoroscopic guidance. There was immediate E flux of blood tinged urine. A Nitrex wire  was inserted and directed into the proximal ureter. An [place] set was utilized to dilate the tract and was subsequently exchanged for a Kumpe catheter over a [place]ents[place] wire. The Kumpe catheter was advanced down the ureter and into the urinary bladder. Postprocedural spot radiographs were obtained in various obliquities and the catheter was sutured to the skin. The catheter was capped and a dressing was placed. The patient tolerated the procedure well without immediate post procedural complication. FINDINGS: Pre procedural spot radiographic images demonstrates upper and lower pole radiopaque renal calculi. Ultrasound scanning was negative for significant hydronephrosis and as such, the stone was targeted fluoroscopically allowing access to the collecting system. Difficult to access due to stone burden in the upper and lower poles, unable to accept access needles. Intravenous pyelogram was performed for puncture of the renal pelvis in order to opacify the collecting system. A posterior, inferomedial pole calyx was targeted and accessed, allowing placement of a Kumpe catheter through the calyx with tip advanced into the urinary bladder. IMPRESSION: Successful fluoroscopic guided placement of a right sided 5 [place] Kumpe catheter to the level of the urinary bladder to be utilized during impending nephrolithotomy procedure. Ruthann Cancer, MD [facility] Radiology Electronically Signed   By: Ruthann Cancer M.D.   On: 07/24/2021 16:45   DG C-Arm 1-60 Min-No Report  Result Date: 07/24/2021 Fluoroscopy was utilized by the requesting physician.  No radiographic interpretation.   DG C-Arm 1-60 Min-No Report  Result Date: 07/24/2021 Fluoroscopy was utilized by the requesting physician.  No radiographic interpretation.   DG C-Arm 1-60 Min-No Report  Result Date: 07/24/2021 Fluoroscopy was utilized by the requesting physician.  No radiographic interpretation.   IR  URETERAL STENT RIGHT NEW ACCESS W/O SEP NEPHROSTOMY CATH  Result Date: 07/24/2021 INDICATION: Renal stones, access for right percutaneous nephrolithotomy. EXAM: 1. FLUOROSCOPIC GUIDANCE FOR PUNCTURE OF THE right SIDED RENAL COLLECTING SYSTEM. 2. FLUOROSCOPIC GUIDED PLACEMENT OF A right SIDED NEPHROURETERAL CATHETER. 3. Intravenous pyelogram. COMPARISON:  CT of the abdomen and pelvis-07/08/2021 MEDICATIONS: Rocephin 2 gm IV; The antibiotic was administered in an appropriate time frame prior to skin puncture. ANESTHESIA/SEDATION: Moderate (conscious) sedation was employed during this procedure. A total of Versed 6 mg and Fentanyl 350 mcg was administered intravenously. Moderate Sedation Time: 115 minutes. The patient's level of consciousness and vital signs were monitored continuously by radiology nursing throughout the procedure under my direct supervision. CONTRAST:  Seventy mL Omnipaque 300-Administered into the renal collecting system. 80 mL Omnipaque 300 administered intravenously. FLUOROSCOPY TIME:  minutes  seconds ( mGy) COMPLICATIONS: None immediate. PROCEDURE: Informed written consent was obtained from the patient after a discussion of the risks, benefits, and alternatives to treatment. The right flank region was prepped with chlorhexidine in a sterile fashion, and a sterile drape was applied covering the operative field. A sterile gown and sterile gloves were used for the procedure. A timeout was performed prior to the initiation of the procedure. A pre procedural spot fluoroscopic image  was obtained of the upper abdomen. Ultrasound scanning performed of the kidney was negative for significant hydronephrosis. As such, the stone within right upper pole was targeted fluoroscopically with a [place] needle. Access to the collecting system was unable to be obtained with advancement of a Nitrex wire into the collecting system due to extensive stone burden in the peripheral aspect of the calyx. Given  inability to visualize calices or lower pole nephrolithiasis sonographically, intravenous pyelogram was performed. Intravenous contrast was injected in approximately 4 minutes after injection there was faint opacification of the collecting system. The central collecting system was then punctured under direct fluoroscopic guidance with the [place] needle. Gentle hand injection of contrast demonstrated needle tip position within the collecting system. A Nitrex wire was inserted and advanced to the proximal ureter. The needle was exchanged for the inner 3 French catheter from an [place] set. A combination of contrast and air was injected into the collecting system for better visualization. A small calyx adjacent to the lower pole renal calculus was opacified and targeted. Despite advancing the needle to the calyx, the Nitrex wire was unable to be advanced into the collecting system. Therefore, an additional peripheral calyx in the mid lower pole was targeted and access with a separate 21 gauge Chiba needle under direct fluoroscopic guidance. There was immediate E flux of blood tinged urine. A Nitrex wire was inserted and directed into the proximal ureter. An Accustick set was utilized to dilate the tract and was subsequently exchanged for a Kumpe catheter over a Bentson wire. The Kumpe catheter was advanced down the ureter and into the urinary bladder. Postprocedural spot radiographs were obtained in various obliquities and the catheter was sutured to the skin. The catheter was capped and a dressing was placed. The patient tolerated the procedure well without immediate post procedural complication. FINDINGS: Pre procedural spot radiographic images demonstrates upper and lower pole radiopaque renal calculi. Ultrasound scanning was negative for significant hydronephrosis and as such, the stone was targeted fluoroscopically allowing access to the collecting system. Difficult to access due to stone burden in the  upper and lower poles, unable to accept access needles. Intravenous pyelogram was performed for puncture of the renal pelvis in order to opacify the collecting system. A posterior, inferomedial pole calyx was targeted and accessed, allowing placement of a Kumpe catheter through the calyx with tip advanced into the urinary bladder. IMPRESSION: Successful fluoroscopic guided placement of a right sided 5 [place] Kumpe catheter to the level of the urinary bladder to be utilized during impending nephrolithotomy procedure. Ruthann Cancer, MD [facility] Radiology Electronically Signed   By: Ruthann Cancer M.D.   On: 07/24/2021 16:45    Assessment: Procedure(s): NEPHROLITHOTOMY PERCUTANEOUS HOLMIUM LASER APPLICATION CYSTOSCOPY/URETEROSCOPY/HOLMIUM LASER/STENT PLACEMENT, 1 Day Post-Op  doing well.  Plan: Neph tube removed this AM. Re-eval at noon for discharge.   Louis Meckel, MD Urology 07/25/2021, 7:27 AM

## 2021-07-25 NOTE — Progress Notes (Signed)
Patient and husband, Ernisha Sorn, have been taught and explained discharge instructions. Patient has no further questions at this time. IV has been removed, site is clean, dry and intact. Right flank incision dressing from removed nephrostomy tube has been changed.  Layla Maw, RN

## 2021-07-25 NOTE — Progress Notes (Signed)
°  Transition of Care [facility]) Screening Note   Patient Details  Name: Michele Hall. Ijames Date of Birth: [birth year]   Transition of Care [facility]) CM/SW Contact:    Lennart Pall, LCSW Phone Number: 07/25/2021, 10:44 AM    Transition of Care Department [facility]) has reviewed patient and no TOC needs have been identified at this time. We will continue to monitor patient advancement through interdisciplinary progression rounds. If new patient transition needs arise, please place a TOC consult.

## 2021-07-29 ENCOUNTER — Encounter: Payer: Self-pay | Admitting: Family Medicine

## 2021-10-29 ENCOUNTER — Ambulatory Visit: Payer: BC Managed Care – PPO | Admitting: Nurse Practitioner

## 2021-10-29 ENCOUNTER — Encounter: Payer: Self-pay | Admitting: Nurse Practitioner

## 2021-10-29 VITALS — BP 114/90 | HR 104 | Temp 97.6°F | Resp 16 | Ht 66.0 in | Wt 263.0 lb

## 2021-10-29 DIAGNOSIS — J029 Acute pharyngitis, unspecified: Secondary | ICD-10-CM | POA: Diagnosis not present

## 2021-10-29 DIAGNOSIS — J020 Streptococcal pharyngitis: Secondary | ICD-10-CM | POA: Diagnosis not present

## 2021-10-29 LAB — POCT RAPID STREP A (OFFICE): Rapid Strep A Screen: POSITIVE — AB

## 2021-10-29 MED ORDER — AMOXICILLIN 500 MG PO CAPS: 500.0000 mg | ORAL_CAPSULE | Freq: Two times a day (BID) | ORAL | 0 refills | Status: DC

## 2021-10-29 NOTE — Patient Instructions (Signed)
Nice to see you today I sent in the antibiotic to your pharmacy Once you have had a full 24 hours of antibiotics and are fever free without the use of tylenol or ibuprofen. You can be around others Follow up if no improvement or symptoms worsen

## 2021-10-29 NOTE — Assessment & Plan Note (Signed)
Strep test positive in office.  We will treat patient with amoxicillin 500 mg twice daily for 10 days.  Did instruct patient she is to be on antibiotics for a full 24 hours to be fever free for 24 hours without use of Tylenol or ibuprofen before she come [place] with family and friends.

## 2021-10-29 NOTE — Progress Notes (Signed)
Acute Office Visit  Subjective:     Patient ID: DNYA HICKLE, female    DOB: June 28, 1965, 56 y.o.   MRN: [id number]  Chief Complaint  Patient presents with   Sore Throat    Started on 10/26/21, on the right side and had right ear pain, post nasal drainage, congestion in her throat, headache, runny nose. No fever. No covid test done.     Patient is in today for Sore throat  Symptoms started on Sunday morning and is getting worse States that last Thursday she went with her son and grandkids camping No covid vaccine No covid test She has taken ibuprofen at night so unsure if it helped   Review of Systems  Constitutional:  Positive for malaise/fatigue. Negative for chills and fever.  HENT:  Positive for congestion, ear pain and sinus pain (not noticed today). Negative for ear discharge.   Respiratory:  Positive for cough. Negative for shortness of breath and wheezing.   Neurological:  Negative for dizziness and headaches.       Objective:    BP 114/90   Pulse (!) 104   Temp 97.6 F (36.4 C)   Resp 16   Ht '5\' 6"'$  (1.676 m)   Wt 263 lb (119.3 kg)   SpO2 97%   BMI 42.45 kg/m    Physical Exam Vitals and nursing note reviewed.  Constitutional:      Appearance: She is well-developed. She is obese.  HENT:     Right Ear: Tympanic membrane and ear canal normal.     Left Ear: Tympanic membrane and ear canal normal.     Nose:     Right Sinus: No maxillary sinus tenderness or frontal sinus tenderness.     Left Sinus: No maxillary sinus tenderness or frontal sinus tenderness.     Mouth/Throat:     Mouth: Mucous membranes are moist.     Pharynx: Uvula midline. Posterior oropharyngeal erythema present. No oropharyngeal exudate or uvula swelling.     Tonsils: No tonsillar exudate or tonsillar abscesses.  Cardiovascular:     Rate and Rhythm: Normal rate and regular rhythm.  Pulmonary:     Effort: Pulmonary effort is normal.     Breath sounds: Normal breath sounds.   Lymphadenopathy:     Cervical: Cervical adenopathy present.  Neurological:     Mental Status: She is alert.    No results found for any visits on 10/29/21.      Assessment & Plan:   Problem List Items Addressed This Visit       Respiratory   Strep throat    Strep test positive in office.  We will treat patient with amoxicillin 500 mg twice daily for 10 days.  Did instruct patient she is to be on antibiotics for a full 24 hours to be fever free for 24 hours without use of Tylenol or ibuprofen before she come [place] with family and friends.       Relevant Medications   amoxicillin (AMOXIL) 500 MG capsule     Other   Sore throat - Primary    Strep test positive in office.  Patient continue using over-the-counter analgesics as needed and helpful.  Follow-up if no improvement       Relevant Orders   Rapid Strep A    Meds ordered this encounter  Medications   amoxicillin (AMOXIL) 500 MG capsule    Sig: Take 1 capsule (500 mg total) by mouth 2 (two) times daily for 10  days.    Dispense:  20 capsule    Refill:  0    Order Specific Question:   Supervising Provider    Answer:   TOWER, MARNE A [[id number]]    Return if symptoms worsen or fail to improve.  Romilda Garret, NP

## 2021-10-29 NOTE — Assessment & Plan Note (Signed)
Strep test positive in office.  Patient continue using over-the-counter analgesics as needed and helpful.  Follow-up if no improvement

## 2021-11-02 ENCOUNTER — Encounter: Payer: Self-pay | Admitting: Nurse Practitioner

## 2021-11-04 MED ORDER — AMOXICILLIN-POT CLAVULANATE 875-125 MG PO TABS: 1.0000 | ORAL_TABLET | Freq: Two times a day (BID) | ORAL | 0 refills | Status: AC

## 2021-11-21 ENCOUNTER — Other Ambulatory Visit: Payer: Self-pay | Admitting: Urology

## 2022-01-22 ENCOUNTER — Ambulatory Visit (HOSPITAL_COMMUNITY): Payer: BC Managed Care – PPO

## 2022-01-22 ENCOUNTER — Encounter (HOSPITAL_COMMUNITY)
Admission: RE | Disposition: A | Discharge status: Home / Self Care | Payer: Self-pay | Source: Ambulatory Visit | Attending: Urology

## 2022-01-22 ENCOUNTER — Ambulatory Visit (HOSPITAL_COMMUNITY): Payer: BC Managed Care – PPO | Admitting: Anesthesiology

## 2022-01-22 ENCOUNTER — Other Ambulatory Visit: Payer: Self-pay | Admitting: Urology

## 2022-01-22 ENCOUNTER — Encounter (HOSPITAL_COMMUNITY): Payer: Self-pay | Admitting: Urology

## 2022-01-22 ENCOUNTER — Ambulatory Visit (HOSPITAL_COMMUNITY)
Admission: RE | Admit: 2022-01-22 | Discharge: 2022-01-22 | Disposition: A | Discharge status: Home / Self Care | Payer: BC Managed Care – PPO | Source: Ambulatory Visit | Attending: Urology | Admitting: Urology

## 2022-01-22 DIAGNOSIS — Z6841 Body Mass Index (BMI) 40.0 and over, adult: Secondary | ICD-10-CM | POA: Insufficient documentation

## 2022-01-22 DIAGNOSIS — Z87891 Personal history of nicotine dependence: Secondary | ICD-10-CM | POA: Insufficient documentation

## 2022-01-22 DIAGNOSIS — N132 Hydronephrosis with renal and ureteral calculous obstruction: Secondary | ICD-10-CM | POA: Diagnosis present

## 2022-01-22 HISTORY — PX: CYSTOSCOPY WITH STENT PLACEMENT: SHX5790

## 2022-01-22 HISTORY — DX: Other complications of anesthesia, initial encounter: T88.59XA

## 2022-01-22 LAB — BASIC METABOLIC PANEL
Anion gap: 8 (ref 5–15)
BUN: 17 mg/dL (ref 6–20)
CO2: 23 mmol/L (ref 22–32)
Calcium: 8.8 mg/dL — ABNORMAL LOW (ref 8.9–10.3)
Chloride: 109 mmol/L (ref 98–111)
Creatinine, Ser: 1.02 mg/dL — ABNORMAL HIGH (ref 0.44–1.00)
GFR, Estimated: >60 mL/min (ref >60)
Glucose, Bld: 84 mg/dL (ref 70–99)
Potassium: 3.9 mmol/L (ref 3.5–5.1)
Sodium: 140 mmol/L (ref 135–145)

## 2022-01-22 LAB — POCT PREGNANCY, URINE: Preg Test, Ur: NEGATIVE

## 2022-01-22 SURGERY — CYSTOSCOPY, WITH STENT INSERTION
Anesthesia: General | Laterality: Left

## 2022-01-22 MED ORDER — MIDAZOLAM HCL 5 MG/5ML IJ SOLN
INTRAMUSCULAR | Status: DC | PRN
  Administered 2022-01-22: 2 mg via INTRAVENOUS

## 2022-01-22 MED ORDER — PROPOFOL 10 MG/ML IV BOLUS
INTRAVENOUS | Status: AC
  Filled 2022-01-22: qty 20

## 2022-01-22 MED ORDER — MIDAZOLAM HCL 2 MG/2ML IJ SOLN
INTRAMUSCULAR | Status: AC
  Filled 2022-01-22: qty 2

## 2022-01-22 MED ORDER — SODIUM CHLORIDE 0.9 % IR SOLN
Status: DC | PRN
  Administered 2022-01-22: 3000 mL

## 2022-01-22 MED ORDER — IOHEXOL 300 MG/ML  SOLN
INTRAMUSCULAR | Status: DC | PRN
  Administered 2022-01-22: 13 mL

## 2022-01-22 MED ORDER — DEXAMETHASONE SODIUM PHOSPHATE 10 MG/ML IJ SOLN
INTRAMUSCULAR | Status: DC | PRN
  Administered 2022-01-22: 10 mg via INTRAVENOUS

## 2022-01-22 MED ORDER — FENTANYL CITRATE PF 50 MCG/ML IJ SOSY: 25.0000 ug | PREFILLED_SYRINGE | INTRAMUSCULAR | Status: DC | PRN

## 2022-01-22 MED ORDER — ONDANSETRON HCL 4 MG/2ML IJ SOLN: 4.0000 mg | Freq: Once | INTRAMUSCULAR | Status: DC | PRN

## 2022-01-22 MED ORDER — PHENYLEPHRINE 80 MCG/ML (10ML) SYRINGE FOR IV PUSH (FOR BLOOD PRESSURE SUPPORT)
PREFILLED_SYRINGE | INTRAVENOUS | Status: DC | PRN
  Administered 2022-01-22 (×2): 160 ug via INTRAVENOUS

## 2022-01-22 MED ORDER — LIDOCAINE 2% (20 MG/ML) 5 ML SYRINGE
INTRAMUSCULAR | Status: DC | PRN
  Administered 2022-01-22: 100 mg via INTRAVENOUS

## 2022-01-22 MED ORDER — FENTANYL CITRATE (PF) 100 MCG/2ML IJ SOLN
INTRAMUSCULAR | Status: AC
  Filled 2022-01-22: qty 2

## 2022-01-22 MED ORDER — CEFAZOLIN SODIUM-DEXTROSE 2-4 GM/100ML-% IV SOLN: 2.0000 g | INTRAVENOUS | Status: DC

## 2022-01-22 MED ORDER — PROPOFOL 10 MG/ML IV BOLUS
INTRAVENOUS | Status: DC | PRN
  Administered 2022-01-22: 200 mg via INTRAVENOUS

## 2022-01-22 MED ORDER — FENTANYL CITRATE (PF) 100 MCG/2ML IJ SOLN
INTRAMUSCULAR | Status: DC | PRN
Start: 2022-01-22 — End: 2022-01-22
  Administered 2022-01-22: 100 ug via INTRAVENOUS

## 2022-01-22 MED ORDER — ONDANSETRON HCL 4 MG/2ML IJ SOLN
INTRAMUSCULAR | Status: DC | PRN
  Administered 2022-01-22: 4 mg via INTRAVENOUS

## 2022-01-22 MED ORDER — LACTATED RINGERS IV SOLN: INTRAVENOUS | Status: DC

## 2022-01-22 MED ORDER — CEFAZOLIN IN SODIUM CHLORIDE 3-0.9 GM/100ML-% IV SOLN
3.0000 g | INTRAVENOUS | Status: AC
  Administered 2022-01-22: 3 g via INTRAVENOUS
  Filled 2022-01-22: qty 100

## 2022-01-22 SURGICAL SUPPLY — 14 items
BAG URO CATCHER STRL LF (MISCELLANEOUS) ×1 IMPLANT
BULB IRRIG PATHFIND (MISCELLANEOUS) IMPLANT
CATH URETL OPEN END 6FR 70 (CATHETERS) ×1 IMPLANT
CLOTH BEACON ORANGE TIMEOUT ST (SAFETY) ×1 IMPLANT
GLOVE SURG LX 7.5 STRW (GLOVE) ×1
GLOVE SURG LX STRL 7.5 STRW (GLOVE) ×1 IMPLANT
GOWN STRL REUS W/ TWL XL LVL3 (GOWN DISPOSABLE) ×1 IMPLANT
GOWN STRL REUS W/TWL XL LVL3 (GOWN DISPOSABLE) ×1
GUIDEWIRE STR DUAL SENSOR (WIRE) ×1 IMPLANT
MANIFOLD NEPTUNE II (INSTRUMENTS) ×1 IMPLANT
PACK CYSTO (CUSTOM PROCEDURE TRAY) ×1 IMPLANT
SYR 20ML LL LF (SYRINGE) ×1 IMPLANT
TUBING CONNECTING 10 (TUBING) ×1 IMPLANT
TUBING UROLOGY SET (TUBING) IMPLANT

## 2022-01-22 NOTE — Anesthesia Procedure Notes (Signed)
Procedure Name: LMA Insertion Date/Time: 01/22/2022 4:00 PM  Performed by: Gean Maidens, CRNAPre-anesthesia Checklist: Patient identified, Emergency Drugs available, Suction available, Patient being monitored and Timeout performed Patient Re-evaluated:Patient Re-evaluated prior to induction Oxygen Delivery Method: Circle system utilized Preoxygenation: Pre-oxygenation with 100% oxygen Induction Type: IV induction Ventilation: Mask ventilation without difficulty LMA: LMA inserted LMA Size: 4.0 Number of attempts: 1 Placement Confirmation: positive ETCO2 and breath sounds checked- equal and bilateral Tube secured with: Tape Dental Injury: Teeth and Oropharynx as per pre-operative assessment

## 2022-01-22 NOTE — Anesthesia Preprocedure Evaluation (Signed)
Anesthesia Evaluation  Patient identified by MRN, date of birth, ID band Patient awake    Reviewed: Allergy & Precautions, NPO status , Patient's Chart, lab work & pertinent test results  Airway Mallampati: II  TM Distance: <3 FB Neck ROM: Full    Dental no notable dental hx.    Pulmonary neg pulmonary ROS, former smoker,    Pulmonary exam normal breath sounds clear to auscultation       Cardiovascular hypertension, Normal cardiovascular exam Rhythm:Regular Rate:Normal     Neuro/Psych negative neurological ROS  negative psych ROS   GI/Hepatic Neg liver ROS, GERD  ,  Endo/Other  Morbid obesity  Renal/GU negative Renal ROS  negative genitourinary   Musculoskeletal negative musculoskeletal ROS (+)   Abdominal   Peds negative pediatric ROS (+)  Hematology negative hematology ROS (+)   Anesthesia Other Findings   Reproductive/Obstetrics negative OB ROS                             Anesthesia Physical Anesthesia Plan  ASA: 3  Anesthesia Plan: General   Post-op Pain Management: Minimal or no pain anticipated   Induction: Intravenous  PONV Risk Score and Plan: 3 and Ondansetron, Dexamethasone and Treatment may vary due to age or medical condition  Airway Management Planned: LMA  Additional Equipment:   Intra-op Plan:   Post-operative Plan: Extubation in OR  Informed Consent: I have reviewed the patients History and Physical, chart, labs and discussed the procedure including the risks, benefits and alternatives for the proposed anesthesia with the patient or authorized representative who has indicated his/her understanding and acceptance.     Dental advisory given  Plan Discussed with: CRNA and Surgeon  Anesthesia Plan Comments:         Anesthesia Quick Evaluation

## 2022-01-22 NOTE — Op Note (Signed)
Preoperative diagnosis:  1.  Left proximal ureteral calculi with obstruction  Postoperative diagnosis: 1.  Same  Procedure(s): 1.  Cystoscopy, left retrograde pyelogram with intraoperative interpretation, insertion left JJ stent  Surgeon: Dr. Harold Barban  Anesthesia: General  Complications: None  EBL: Minimal  Specimens: None  Disposition of specimens: Not applicable  Intraoperative findings: 2 proximal ureteral calculi approximately 7 mm in size with high-grade obstruction.  Able to manipulate wire by the stones in place 6 French by 24 cm Percuflex plus soft contour stent.  Hydronephrotic flow of urine through and around the stent noted.  Indication: 56 year old white female presented with cute onset left-sided flank pain.  Found to have to 7 mm proximal ureteral calculi with significant hydronephrosis on CT urogram.  Continues to have severe pain presents this time to go cystoscopy and insertion of left JJ stent.  Description of procedure:  After obtaining informed consent for the patient was taken major cystoscopy suite placed under general anesthesia.  Placed in dorsolithotomy position genitalia prepped and draped in usual sterile fashion.  Proper pause and timeout was performed for site of procedure.  15 [place] the scope was advanced in the bladder without difficulty.  Bladder appeared grossly normal.  Left ureteral orifice was identified.  This was cannulated with a 5 French open tip catheter retrograde pyelogram was performed revealing filling defects in the left proximal ureter.  Contrast did go retrograde past the stone into the renal pelvis which was dilated.  Open tip catheter was manipulated up to the level of the stone I could not disengage the stone proximally.  A sensor wire was subsequently passed around the stones and into the left renal pelvis where it was coiled.  The operative catheter was removed.  A 6 French by 24 cm Percuflex plus soft Contour stent was placed  with minimal resistance going around the stone surprisingly to the left renal pelvis.  The wire was withdrawn leaving a proximal coil in the renal pelvis and a distal coil in the bladder.  There is hydronephrotic flow of urine through and around the stent noted.  Bladder was emptied the procedure terminated.  She was awakened from anesthesia and taken back to the recovery room in stable condition.  No immediate complication from the procedure.

## 2022-01-22 NOTE — Anesthesia Postprocedure Evaluation (Signed)
Anesthesia Post Note  Patient: Michele Hall  Procedure(s) Performed: CYSTOSCOPY WITH STENT PLACEMENT (Left)     Patient location during evaluation: PACU Anesthesia Type: General Level of consciousness: awake and alert Pain management: pain level controlled Vital Signs Assessment: post-procedure vital signs reviewed and stable Respiratory status: spontaneous breathing, nonlabored ventilation, respiratory function stable and patient connected to nasal cannula oxygen Cardiovascular status: blood pressure returned to baseline and stable Postop Assessment: no apparent nausea or vomiting Anesthetic complications: no   No notable events documented.  Last Vitals:  Vitals:   01/22/22 1428 01/22/22 1626  BP: (!) 170/108   Pulse: (!) 115   Resp: 17   Temp: 36.8 C 37.6 C  SpO2: 97%     Last Pain:  Vitals:   01/22/22 1645  TempSrc:   PainSc: 0-No pain                 Katurah Karapetian S

## 2022-01-22 NOTE — Interval H&P Note (Signed)
History and Physical Interval Note:  01/22/2022 3:37 PM  Michele Hall  has presented today for surgery, with the diagnosis of LEFT HYDRONEPHROSIS.  The various methods of treatment have been discussed with the patient and family. After consideration of risks, benefits and other options for treatment, the patient has consented to  Procedure(s): [place] (Left) as a surgical intervention.  The patient's history has been reviewed, patient examined, no change in status, stable for surgery.  I have reviewed the patient's chart and labs.  Questions were answered to the patient's satisfaction.     Remi [name]

## 2022-01-22 NOTE — Transfer of Care (Signed)
Immediate Anesthesia Transfer of Care Note  Patient: Michele Hall  Procedure(s) Performed: CYSTOSCOPY WITH STENT PLACEMENT (Left)  Patient Location: PACU  Anesthesia Type:General  Level of Consciousness: sedated, patient cooperative and responds to stimulation  Airway & Oxygen Therapy: Patient Spontanous Breathing and Patient connected to face mask oxygen  Post-op Assessment: Report given to RN and Post -op Vital signs reviewed and stable  Post vital signs: Reviewed and stable  Last Vitals:  Vitals Value Taken Time  BP 129/82 01/22/22 1625  Temp    Pulse 84 01/22/22 1626  Resp 17 01/22/22 1626  SpO2 97 % 01/22/22 1626  Vitals shown include unvalidated device data.  Last Pain:  Vitals:   01/22/22 1428  TempSrc: Oral         Complications: No notable events documented.

## 2022-01-22 NOTE — H&P (Signed)
2.16.2023: Underwent Lt URS/HLL/stent as well as RT PCNL. Stents removed in office 3.2.2023.   3.27.2023: She has had no issues with gross hematuria since her procedure/discharge. No fevers, no dysuria.   6.14.2023: Here for routine check. She had 24-hour urine returned about a month ago. This revealed significant hypercalciuria, increased urine sodium, hyperuricosuria, urine volume 2.1 L. She has had no flank pain or hematuria. No urinary tract infections.   01/22/22: Michele Hall is a 56 year old female who presents today with concerns of severe left-sided flank pain and discomfort. This began approximately 2 weeks ago and is associated with nausea. She has also had some dark cloudy urine. The pain has been more intense since this morning around 130 AM and she walked into the office today with significant flank discomfort. She has not had anything for pain. She denies fevers and chills. She denies gross hematuria.   She underwent a right PCNL and left ureteroscopy with holmium laser and stent about 6 months ago. Her stents were successfully removed in office.     ALLERGIES: None   MEDICATIONS: Allegra Allergy 1 PO Daily  Probiotic 1 PO Daily  Spironolactone     GU PSH: Cysto Remove Stent FB Sim - 08/07/2021, 05/14/2020 Cysto Uretero Lithotripsy, Left - 04/18/2020 PCNL, Left Percut Stone Removal >2cm - 07/24/2021 Ureteroscopic laser litho, Left - 04/18/2020     NON-GU PSH: Cholecystectomy (open)     GU PMH: Renal calculus, Still with bilateral stone disease - 11/19/2021, Status post left ureteroscopy and right percutaneous nephrolithotomy-the latter of these was abbreviated due to bleeding and lack of visualization. She has had stents removed. She still has a fair stone burden on the right with a fair amount of fragmentation., - 09/01/2021, - 08/07/2021, - 07/15/2021, Large right-sided renal calculi, currently asymptomatic, - 05/05/2021 (Stable), It does not look like she is passing any calculi but her  right renal stone burden is greater. Left kidney draining better than last ultrasound showed, - 04/18/2021, she had multiple renal and ureteral calculi removed in November 2021. Still some stones noted in the left side, most likely intraparenchymal. On the right, she has multiple stones, - 08/28/2020, - 06/05/2020 Ureteral calculus - 09/01/2021, - 08/07/2021, - 07/15/2021, - 06/26/2021, - 06/16/2021, - 06/05/2020, - 05/14/2020, Pt has L-sided ureteral stone and renal stone burden that will likely require treatment., - 04/12/2020 Hydronephrosis - 06/26/2021, - 06/16/2021 (Stable), she does have hydronephrosis. She did have prior ureteroscopy by another urologist prior to my procedure, which did involve some ureteral trauma. He is asymptomatic but this needs further evaluation, - 08/28/2020, - 06/05/2020 Urinary Tract Inf, Unspec site, Probable UTI - 04/18/2021      PMH Notes:  1898-06-08 00:00:00 - Note: Normal Routine History And Physical Adult   NON-GU PMH: No Non-GU PMH    FAMILY HISTORY: Blood In Urine - Father Kidney Stones - Father Renal Disease - Father   SOCIAL HISTORY: Marital Status: Married Preferred Language: English; Ethnicity: Not Hispanic Or Latino; Race: White Current Smoking Status: Patient has never smoked.   Tobacco Use Assessment Completed: Used Tobacco in last 30 days? Does not use smokeless tobacco. Has never drank.  Does not use drugs. Drinks 1 caffeinated drink per day. Has not had a blood transfusion.    REVIEW OF SYSTEMS:    GU Review Female:   Patient denies hard to postpone urination, burning /pain with urination, get up at night to urinate, leakage of urine, stream starts and stops, trouble starting your stream, have to  strain to urinate, and being pregnant.  Gastrointestinal (Upper):   Patient reports nausea. Patient denies vomiting and indigestion/ heartburn.  Gastrointestinal (Lower):   Patient denies diarrhea and constipation.  Constitutional:   Patient reports  fatigue. Patient denies weight loss, fever, and night sweats.  Musculoskeletal:   Patient reports back pain. Patient denies joint pain.   VITAL SIGNS:      01/22/2022 11:49 AM  Weight 260 lb / 117.93 kg  Height 66 in / 167.64 cm  BP 124/85 mmHg  Pulse 92 /min  Temperature 97.8 F / 36.5 C  BMI 42.0 kg/m   GU PHYSICAL EXAMINATION:      Notes: Left flank tenderness   MULTI-SYSTEM PHYSICAL EXAMINATION:    Constitutional: Obese. No physical deformities. Normally developed. Good grooming.   Respiratory: No labored breathing, no use of accessory muscles.   Cardiovascular: Normal temperature, normal extremity pulses, no swelling, no varicosities.  Skin: No paleness, no jaundice, no cyanosis. No lesion, no ulcer, no rash.  Neurologic / Psychiatric: Oriented to time, oriented to place, oriented to person. No depression, no anxiety, no agitation.  Gastrointestinal: No mass, no tenderness, no rigidity, non obese abdomen.     Complexity of Data:  Source Of History:  Patient  Records Review:   Previous Doctor Records, Previous Patient Records  Urine Test Review:   Urinalysis  X-Ray Review: C.T. Stone Protocol: Reviewed Films. Reviewed Report. Discussed With Patient.     01/22/22  Urinalysis  Urine Appearance Slightly Cloudy   Urine Color Yellow   Urine Glucose Neg mg/dL  Urine Bilirubin Neg mg/dL  Urine Ketones Neg mg/dL  Urine Specific Gravity 1.015   Urine Blood 3+ ery/uL  Urine pH 6.0   Urine Protein Neg mg/dL  Urine Urobilinogen 0.2 mg/dL  Urine Nitrites Neg   Urine Leukocyte Esterase 3+ leu/uL  Urine WBC/hpf 10 - 20/hpf   Urine RBC/hpf NS (Not Seen)   Urine Epithelial Cells 0 - 5/hpf   Urine Bacteria Rare (0-9/hpf)   Urine Mucous Not Present   Urine Yeast NS (Not Seen)   Urine Trichomonas Not Present   Urine Cystals NS (Not Seen)   Urine Casts NS (Not Seen)   Urine Sperm Not Present    PROCEDURES:         C.T. Urogram - [id number]      Patient confirmed No Neulasta  OnPro Device.         Urinalysis w/Scope Dipstick Dipstick Cont'd Micro  Color: Yellow Bilirubin: Neg mg/dL WBC/hpf: 10 - 20/hpf  Appearance: Slightly Cloudy Ketones: Neg mg/dL RBC/hpf: NS (Not Seen)  Specific Gravity: 1.015 Blood: 3+ ery/uL Bacteria: Rare (0-9/hpf)  pH: 6.0 Protein: Neg mg/dL Cystals: NS (Not Seen)  Glucose: Neg mg/dL Urobilinogen: 0.2 mg/dL Casts: NS (Not Seen)    Nitrites: Neg Trichomonas: Not Present    Leukocyte Esterase: 3+ leu/uL Mucous: Not Present      Epithelial Cells: 0 - 5/hpf      Yeast: NS (Not Seen)      Sperm: Not Present         Ketoralac '30mg'$  - [id number], [id number] Qty: 30 Adm. By: Pricilla Riffle  Unit: mg Lot No [id number]  Route: IM Exp. Date 07/02/2021  Freq: None Mfgr.:   Site: Left Hip         Morphine '4mg'$  - [id number], J2270 Qty: 4 Adm. By: Pricilla Riffle  Unit: mg Lot No [id number]  Route: IM Exp. Date 02/07/2023  Freq: None Mfgr.:   Site: Right Hip  Phenergan '25mg'$  - J2550, [id number] Qty: 25 Adm. By: Pricilla Riffle  Unit: mg Lot No [id number]  Route: IM Exp. Date 11/07/2022  Freq: None Mfgr.:   Site: Left Hip   ASSESSMENT:      ICD-10 Details  1 GU:   Hydronephrosis - N13.0 Left, Acute, Systemic Symptoms  2   Ureteral calculus - N20.1 Left, Acute, Systemic Symptoms   PLAN:            Medications New Meds: Ketorolac Tromethamine 10 mg tablet 1 tablet PO Q 6 H PRN For pain  #20  0 Refill(s)  Tamsulosin Hcl 0.4 mg capsule 1 capsule PO Q HS   #30  1 Refill(s)  Cephalexin 500 mg capsule 1 capsule PO Q 12 H   #14  0 Refill(s)  Ondansetron Odt 8 mg tablet,disintegrating 1 tablet PO Q 6 H PRN for nausea  #20  0 Refill(s)  Oxycodone-Acetaminophen 5 mg-325 mg tablet 1 tablet PO Q 6 H PRN for severe kidney stone pain  #20  0 Refill(s)  Pharmacy Name:  [id number] Address:  [address] [place] [address] Phone:  [phone] Fax:  [phone]           Orders X-Rays: C.T. Stone Protocol Without I.V. Contrast -  Left flank pain, post op PCNL qand stent 5 months ago  X-Ray Notes: History:  Hematuria: Yes/No  Patient to see MD after exam: Yes/No  Previous exam: CT / IVP/ US/ KUB/ None  When:  Where:  Diabetic: Yes/ No  BUN/ Creatinine:  Date of last BUN Creatinine:  Weight in pounds:  Allergy- IV Contrast: Yes/ No  Conflicting diabetic meds: Yes/ No  Diabetic Meds:  Prior Authorization #:Luberta [id number] 01/22/2022 - 02/20/2022            Schedule Procedure: 01/22/2022 at [facility] - [phone]- Morphine '4mg'$  (Ther/Proph/Diag Inj, [place]/Im) -- [id number] [facility]: [facility] 2Webb'25mg'$  (Phenergan Per 50 Mg) - J2550, [phone] [facility]: [facility] [phone]- Ketoralac '30mg'$  (Toradol Per 15 Mg) - [id number] [phone]         Document Letter(s):  Created for Patient: Clinical Summary         Notes:   Urine will be sent for precautionary culture today she was given IM morphine 4 mg, 30 mg of Toradol, and 25 mg of Phenergan in office due to significant pain and nausea. She continues to have significant pain and discomfort. CT imaging shows moderate to severe left-sided hydronephrosis with 2 obstructing ureteral stones measuring approximately 7 mm each. Options for management were discussed, and based off of her pain I advised a left ureteral stent would help manage her discomfort and hopefully keep her out of the emergency department. Her urine also has some bacteria and I plan on sending in a antibiotic for her today. Her CT scan was discussed with on-call physician, Dr. NMilford Cagewho agreed with plan of care. The patient understands that there is a potential for interventional radiology to perform a left-sided PCN if unable to place stents. The patient also understands the risks to the procedure and the risks of general anesthesia. She understands this will be a staged  procedure and ureteroscopy will be required at a later date. Finally, she understands that stents can be uncomfortable and can cause some bleeding as well  as urinary frequency. She was agreeable to this plan.   I did send in empiric cephalexin until culture results this culture will likely not be available until after the weekend. I also sent in nausea and pain medication as well as Toradol and oxycodone for stent discomfort and stone pain.  Addendum:. Reviewed CT scan discussed need for urgent stent for pain management. Also discussed possibility of left-sided nephrostomy tube if unable to get stent in place. Risk and benefits of the procedure were discussed in detail and she agrees to proceed    Signed by Harold Barban, M.D. on 01/22/22 at 2:13 PM (EDT)

## 2022-01-23 ENCOUNTER — Encounter (HOSPITAL_COMMUNITY): Payer: Self-pay | Admitting: Urology

## 2022-01-28 ENCOUNTER — Other Ambulatory Visit: Payer: Self-pay

## 2022-01-28 ENCOUNTER — Other Ambulatory Visit: Payer: Self-pay | Admitting: Urology

## 2022-01-28 ENCOUNTER — Encounter (HOSPITAL_COMMUNITY): Payer: Self-pay | Admitting: Urology

## 2022-01-28 NOTE — Progress Notes (Addendum)
For Short Stay: [place] appointment date:N/A Date of COVID positive in last 51 days:N/A  Bowel Prep reminder:N/A   For Anesthesia: PCP - Jinny Sanders, MD last office visit note by Michela Pitcher, NP 10/29/21 in epic Cardiologist - N/A  Chest x-ray - N/A EKG - 07/15/21 in epic Stress Test - N/A ECHO - N/A Cardiac Cath - N/A Pacemaker/ICD device last checked:N/A Pacemaker orders received:N/A Device Rep notified:N/A  Spinal Cord Stimulator: N/A  Sleep Study - N/A CPAP -N/A   Fasting Blood Sugar - N/A Checks Blood Sugar ___N/A__ times a day Date and result of last Hgb A1c-N/A  Blood Thinner Instructions:N/A Aspirin Instructions:N/A Last Dose:N/A  Activity level:   Able to exercise without chest pain and/or shortness of breath    Anesthesia review: N/A  Patient denies shortness of breath, fever, cough and chest pain at PAT appointment   Patient verbalized understanding of instructions that were given to them at the PAT appointment. Patient was also instructed that they will need to review over the PAT instructions again at home before surgery.

## 2022-01-29 ENCOUNTER — Ambulatory Visit (HOSPITAL_COMMUNITY): Payer: BC Managed Care – PPO | Admitting: Registered Nurse

## 2022-01-29 ENCOUNTER — Ambulatory Visit (HOSPITAL_COMMUNITY)
Admission: RE | Admit: 2022-01-29 | Discharge: 2022-01-29 | Disposition: A | Discharge status: Home / Self Care | Payer: BC Managed Care – PPO | Attending: Urology | Admitting: Urology

## 2022-01-29 ENCOUNTER — Ambulatory Visit (HOSPITAL_COMMUNITY): Payer: BC Managed Care – PPO

## 2022-01-29 ENCOUNTER — Encounter (HOSPITAL_COMMUNITY): Payer: Self-pay | Admitting: Urology

## 2022-01-29 ENCOUNTER — Encounter (HOSPITAL_COMMUNITY)
Admission: RE | Disposition: A | Discharge status: Home / Self Care | Payer: Self-pay | Source: Home / Self Care | Attending: Urology

## 2022-01-29 DIAGNOSIS — I10 Essential (primary) hypertension: Secondary | ICD-10-CM | POA: Diagnosis not present

## 2022-01-29 DIAGNOSIS — Z87891 Personal history of nicotine dependence: Secondary | ICD-10-CM | POA: Diagnosis not present

## 2022-01-29 DIAGNOSIS — Z6841 Body Mass Index (BMI) 40.0 and over, adult: Secondary | ICD-10-CM | POA: Insufficient documentation

## 2022-01-29 DIAGNOSIS — N202 Calculus of kidney with calculus of ureter: Secondary | ICD-10-CM | POA: Diagnosis present

## 2022-01-29 HISTORY — PX: HOLMIUM LASER APPLICATION: SHX5852

## 2022-01-29 HISTORY — PX: CYSTOSCOPY WITH RETROGRADE PYELOGRAM, URETEROSCOPY AND STENT PLACEMENT: SHX5789

## 2022-01-29 LAB — BASIC METABOLIC PANEL
Anion gap: 9 (ref 5–15)
BUN: 13 mg/dL (ref 6–20)
CO2: 22 mmol/L (ref 22–32)
Calcium: 9 mg/dL (ref 8.9–10.3)
Chloride: 109 mmol/L (ref 98–111)
Creatinine, Ser: 0.87 mg/dL (ref 0.44–1.00)
GFR, Estimated: >60 mL/min (ref >60)
Glucose, Bld: 107 mg/dL — ABNORMAL HIGH (ref 70–99)
Potassium: 3.7 mmol/L (ref 3.5–5.1)
Sodium: 140 mmol/L (ref 135–145)

## 2022-01-29 SURGERY — CYSTOURETEROSCOPY, WITH RETROGRADE PYELOGRAM AND STENT INSERTION
Anesthesia: General | Site: Ureter | Laterality: Bilateral

## 2022-01-29 MED ORDER — PROPOFOL 10 MG/ML IV BOLUS
INTRAVENOUS | Status: AC
  Filled 2022-01-29: qty 20

## 2022-01-29 MED ORDER — LIDOCAINE 2% (20 MG/ML) 5 ML SYRINGE
INTRAMUSCULAR | Status: DC | PRN
  Administered 2022-01-29: 80 mg via INTRAVENOUS

## 2022-01-29 MED ORDER — OXYCODONE HCL 5 MG/5ML PO SOLN: 5.0000 mg | Freq: Once | ORAL | Status: AC | PRN

## 2022-01-29 MED ORDER — DEXAMETHASONE SODIUM PHOSPHATE 10 MG/ML IJ SOLN
INTRAMUSCULAR | Status: DC | PRN
  Administered 2022-01-29: 8 mg via INTRAVENOUS

## 2022-01-29 MED ORDER — OXYCODONE HCL 5 MG PO TABS
ORAL_TABLET | ORAL | Status: AC
  Filled 2022-01-29: qty 1

## 2022-01-29 MED ORDER — LACTATED RINGERS IV SOLN: INTRAVENOUS | Status: DC

## 2022-01-29 MED ORDER — FENTANYL CITRATE (PF) 100 MCG/2ML IJ SOLN
INTRAMUSCULAR | Status: DC | PRN
  Administered 2022-01-29 (×2): 25 ug via INTRAVENOUS
  Administered 2022-01-29: 50 ug via INTRAVENOUS

## 2022-01-29 MED ORDER — EPHEDRINE 5 MG/ML INJ
INTRAVENOUS | Status: AC
  Filled 2022-01-29: qty 5

## 2022-01-29 MED ORDER — CEFAZOLIN SODIUM-DEXTROSE 2-3 GM-%(50ML) IV SOLR
INTRAVENOUS | Status: DC | PRN
  Administered 2022-01-29: 2 g via INTRAVENOUS

## 2022-01-29 MED ORDER — OXYCODONE HCL 5 MG PO TABS
5.0000 mg | ORAL_TABLET | Freq: Once | ORAL | Status: AC | PRN
  Administered 2022-01-29: 5 mg via ORAL

## 2022-01-29 MED ORDER — MIDAZOLAM HCL 2 MG/2ML IJ SOLN
INTRAMUSCULAR | Status: AC
  Filled 2022-01-29: qty 2

## 2022-01-29 MED ORDER — CHLORHEXIDINE GLUCONATE 0.12 % MT SOLN
15.0000 mL | Freq: Once | OROMUCOSAL | Status: AC
Start: 2022-01-29 — End: 2022-01-29
  Administered 2022-01-29: 15 mL via OROMUCOSAL

## 2022-01-29 MED ORDER — PHENYLEPHRINE 80 MCG/ML (10ML) SYRINGE FOR IV PUSH (FOR BLOOD PRESSURE SUPPORT)
PREFILLED_SYRINGE | INTRAVENOUS | Status: DC | PRN
  Administered 2022-01-29 (×2): 80 ug via INTRAVENOUS
  Administered 2022-01-29 (×2): 160 ug via INTRAVENOUS

## 2022-01-29 MED ORDER — DEXAMETHASONE SODIUM PHOSPHATE 10 MG/ML IJ SOLN
INTRAMUSCULAR | Status: AC
  Filled 2022-01-29: qty 1

## 2022-01-29 MED ORDER — SODIUM CHLORIDE 0.9 % IR SOLN
Status: DC | PRN
  Administered 2022-01-29 (×2): 3000 mL

## 2022-01-29 MED ORDER — FENTANYL CITRATE PF 50 MCG/ML IJ SOSY
25.0000 ug | PREFILLED_SYRINGE | INTRAMUSCULAR | Status: DC | PRN
  Administered 2022-01-29: 50 ug via INTRAVENOUS

## 2022-01-29 MED ORDER — ACETAMINOPHEN 500 MG PO TABS: 1000.0000 mg | ORAL_TABLET | Freq: Once | ORAL | Status: DC | PRN

## 2022-01-29 MED ORDER — FENTANYL CITRATE (PF) 100 MCG/2ML IJ SOLN
INTRAMUSCULAR | Status: AC
  Filled 2022-01-29: qty 2

## 2022-01-29 MED ORDER — EPHEDRINE SULFATE-NACL 50-0.9 MG/10ML-% IV SOSY
PREFILLED_SYRINGE | INTRAVENOUS | Status: DC | PRN
  Administered 2022-01-29 (×2): 5 mg via INTRAVENOUS

## 2022-01-29 MED ORDER — ACETAMINOPHEN 160 MG/5ML PO SOLN: 1000.0000 mg | Freq: Once | ORAL | Status: DC | PRN

## 2022-01-29 MED ORDER — 0.9 % SODIUM CHLORIDE (POUR BTL) OPTIME
TOPICAL | Status: DC | PRN
  Administered 2022-01-29: 1000 mL

## 2022-01-29 MED ORDER — MIDAZOLAM HCL 5 MG/5ML IJ SOLN
INTRAMUSCULAR | Status: DC | PRN
  Administered 2022-01-29: 2 mg via INTRAVENOUS

## 2022-01-29 MED ORDER — CEFAZOLIN SODIUM-DEXTROSE 2-4 GM/100ML-% IV SOLN
INTRAVENOUS | Status: AC
  Filled 2022-01-29: qty 100

## 2022-01-29 MED ORDER — ACETAMINOPHEN 10 MG/ML IV SOLN: 1000.0000 mg | Freq: Once | INTRAVENOUS | Status: DC | PRN

## 2022-01-29 MED ORDER — FENTANYL CITRATE PF 50 MCG/ML IJ SOSY
PREFILLED_SYRINGE | INTRAMUSCULAR | Status: AC
  Filled 2022-01-29: qty 1

## 2022-01-29 MED ORDER — PROPOFOL 10 MG/ML IV BOLUS
INTRAVENOUS | Status: DC | PRN
  Administered 2022-01-29: 270 mg via INTRAVENOUS

## 2022-01-29 MED ORDER — ONDANSETRON HCL 4 MG/2ML IJ SOLN
INTRAMUSCULAR | Status: DC | PRN
  Administered 2022-01-29: 4 mg via INTRAVENOUS

## 2022-01-29 MED ORDER — ARTIFICIAL TEARS OPHTHALMIC OINT
TOPICAL_OINTMENT | OPHTHALMIC | Status: AC
  Filled 2022-01-29: qty 3.5

## 2022-01-29 MED ORDER — ORAL CARE MOUTH RINSE
15.0000 mL | Freq: Once | OROMUCOSAL | Status: AC
Start: 2022-01-29 — End: 2022-01-29

## 2022-01-29 MED ORDER — PHENYLEPHRINE 80 MCG/ML (10ML) SYRINGE FOR IV PUSH (FOR BLOOD PRESSURE SUPPORT)
PREFILLED_SYRINGE | INTRAVENOUS | Status: AC
  Filled 2022-01-29: qty 10

## 2022-01-29 MED ORDER — ONDANSETRON HCL 4 MG/2ML IJ SOLN
INTRAMUSCULAR | Status: AC
  Filled 2022-01-29: qty 2

## 2022-01-29 SURGICAL SUPPLY — 24 items
BAG URO CATCHER STRL LF (MISCELLANEOUS) ×2 IMPLANT
BASKET ZERO TIP NITINOL 2.4FR (BASKET) ×2 IMPLANT
CATH URETL OPEN END 6FR 70 (CATHETERS) IMPLANT
EXTRACTOR STONE 1.7FRX115CM (UROLOGICAL SUPPLIES) ×2 IMPLANT
GLOVE SURG LX 8.0 MICRO (GLOVE) ×2
GLOVE SURG LX STRL 8.0 MICRO (GLOVE) ×2 IMPLANT
GOWN STRL REUS W/ TWL XL LVL3 (GOWN DISPOSABLE) ×2 IMPLANT
GOWN STRL REUS W/TWL XL LVL3 (GOWN DISPOSABLE) ×2
GUIDEWIRE ANG ZIPWIRE 038X150 (WIRE) IMPLANT
GUIDEWIRE STR DUAL SENSOR (WIRE) ×2 IMPLANT
IV NS 1000ML (IV SOLUTION) ×2
IV NS 1000ML BAXH (IV SOLUTION) ×2 IMPLANT
KIT TURNOVER KIT A (KITS) IMPLANT
LASER FIB FLEXIVA PULSE ID 365 (Laser) IMPLANT
MANIFOLD NEPTUNE II (INSTRUMENTS) ×2 IMPLANT
PACK CYSTO (CUSTOM PROCEDURE TRAY) ×2 IMPLANT
SHEATH NAVIGATOR HD 11/13X28 (SHEATH) IMPLANT
SHEATH NAVIGATOR HD 11/13X36 (SHEATH) IMPLANT
SHEATH NAVIGATOR HD 12/14X46 (SHEATH) IMPLANT
STENT URET 6FRX26 CONTOUR (STENTS) IMPLANT
TRACTIP FLEXIVA PULS ID 200XHI (Laser) IMPLANT
TRACTIP FLEXIVA PULSE ID 200 (Laser)
TUBING CONNECTING 10 (TUBING) ×2 IMPLANT
TUBING UROLOGY SET (TUBING) ×2 IMPLANT

## 2022-01-29 NOTE — Anesthesia Preprocedure Evaluation (Addendum)
Anesthesia Evaluation  Patient identified by MRN, date of birth, ID band Patient awake    Reviewed: Allergy & Precautions, NPO status , Patient's Chart, lab work & pertinent test results  Airway Mallampati: II  TM Distance: <3 FB Neck ROM: Full    Dental no notable dental hx.    Pulmonary neg pulmonary ROS, former smoker,    Pulmonary exam normal breath sounds clear to auscultation       Cardiovascular hypertension, Pt. on medications Normal cardiovascular exam Rhythm:Regular Rate:Normal     Neuro/Psych negative neurological ROS  negative psych ROS   GI/Hepatic Neg liver ROS, GERD  ,  Endo/Other  Morbid obesity  Renal/GU Renal diseaseLab Results      Component                Value               Date                      CREATININE               0.87                01/29/2022                Musculoskeletal negative musculoskeletal ROS (+)   Abdominal   Peds  Hematology negative hematology ROS (+)   Anesthesia Other Findings   Reproductive/Obstetrics                             Anesthesia Physical Anesthesia Plan  ASA: 3  Anesthesia Plan: General   Post-op Pain Management:    Induction: Intravenous  PONV Risk Score and Plan: 3 and Ondansetron and Dexamethasone  Airway Management Planned: LMA  Additional Equipment: None  Intra-op Plan:   Post-operative Plan: Extubation in OR  Informed Consent: I have reviewed the patients History and Physical, chart, labs and discussed the procedure including the risks, benefits and alternatives for the proposed anesthesia with the patient or authorized representative who has indicated his/her understanding and acceptance.     Dental advisory given  Plan Discussed with: CRNA  Anesthesia Plan Comments:         Anesthesia Quick Evaluation

## 2022-01-29 NOTE — Op Note (Signed)
Preoperative diagnosis: Right renal calculi, large volume, left ureteral calculus, status post stenting  Postoperative diagnosis: Same  Principal procedure: Cystoscopy, bilateral double-J stent exchanges (26 cm x 6 [place] without tether) bilateral ureteroscopy, holmium laser dusting of left upper ureteral and right renal calculi, bilateral retrograde ureteropyelogram's, fluoroscopic use for 2 hours  Surgeon: Azariya Freeman  Anesthesia: General with LMA  Complications: None  Estimated blood loss: None  Specimen: Stone fragments, urine for  Drains: None  Indication: 56 year old female with history of urolithiasis.  She had right percutaneous nephrolithotomy in 2022.  Despite this, she does have significant residual stone volume on the right.  Additionally, she was recently stented for symptomatic left upper ureteral stone.  She presents at this time for ureteroscopic management of both sides, with laser treatment with either dusting or fragmentation and extraction, with replacement of her stents.  I discussed the procedure with the patient.  I discussed other options besides ureteroscopy.  Risks and complications have been discussed including but not limited to infection, need for replacement of ste  findings: Bladder appeared normal with left ureteral stent normally positioned.  No urine the ileal abnormalities.  Retrograde studies of both ureters following left stent extraction revealed filling defect in the left proximal ureter with mild clubbing of the pyelocalyceal system on that side.  No other filling defects were noted.  On the right, ureter was normal.  There were filling defects in the upper pole calyceal system consistent with a large stone burden.  Description of procedure: Patient was properly identified in the holding area and received preoperative IV antibiotics which was taken to the operating room where general anesthetic was administered with the LMA.  She was placed in the  dorsolithotomy position.  Genitalia and perineum were prepped, draped, proper timeout performed.  Cystoscope was passed.  Brief inspection of the bladder performed.  Urine from the bladder was sent for culture.  Left stent was easily extracted.  Retrograde study with the open-ended catheter revealed the above-mentioned findings.  Following this, sensor tip guidewire was easily advanced into the upper pole calyceal system.  Scope and open-ended catheter were removed.  Ureter was sequentially dilated first with the obturator then the entire 11/13 ureteral access catheter.  This was then removed.  Guidewire left in place.  6 French dual-lumen semirigid ureteroscope was advanced up to into the ureter and the stone was identified.  Using the 365 m fiber, the stone was easily dusted using the dusting settings on the [facility] holmium laser.  No significant fragments remained.  Most of the fragments were rinsed distally into the bladder.  I then passed the scope all the way to the UPJ.  No further stones were seen.  The semirigid scope was then removed.  I then passed the ureteral access sheath, remove the core, and passed the flexible dual-lumen ureteroscope into the pyelocalyceal system.  Sequential inspection was made of the entire system.  There were multiple Randall's plaques on the calyceal papillae.  However, no significant stone burden was noted.  Scope was removed.  The guidewire was replaced and using cystoscopic assistance, a 6 [place] by 26 cm contour double-J stent was placed, adequately positioned.  Excellent proximal and distal curls were then seen using fluoroscopy and cystoscopy, respectively.  The right retrograde ureteropyelogram was then performed using the open-ended catheter and Omnipaque.  Above-mentioned findings were noted.  I then passed a sensor tip guidewire through the open-ended catheter.  I then removed the open-ended catheter and the cystoscope.  The ureter was then sequentially dilated with  a medium length ureteral access catheter, first the obturator and then the entire catheter was placed.  A safety wire was placed.  I then passed the flexible dual-lumen ureteroscope into the pyelocalyceal system.  Interpolar stones were noted and these were dusted using the [place] system.  The 2 stones were turned into dust quite easily.  I then passed the scope into the upper pole calyx.  2 large calculi were seen.  Using the same laser energy, set at dusting, the same process was used to ablate the 2 stones within the calyx.  Approximately 1 hours was spent on these 2 stones.  No significant sized fragments were remaining at this point and I felt that these small particles could easily pass distally and into the bladder along the stent.  The scope was used to inspect all further calyces.  There were 2 small stones in the lower pole calyx.  1 of these was extracted for specimen, the other dusted using the holmium laser.  I then sequentially inspected all the calyces.  No further significant stone burden was noted.  Pelvis was inspected, and again no stones seen.  At this point the scope was backed out.  The access catheter was then removed.  Over top of the safety guidewire, which was backloaded through the cystoscope, a 6 [place] by 26 cm contour double-J stent with tether removed was easily position.  Once the guidewire was removed, excellent distal and proximal curls were seen using cystoscopy and fluoroscopy, respectively.  The bladder was then drained.  The scope was removed and the procedure terminated.  The patient was then awakened, taken to the PACU in stable condition having tolerated the procedure well.

## 2022-01-29 NOTE — H&P (Signed)
H&P  Chief Complaint: Kidney stones  History of Present Illness: Michele Hall is a 56 y.o. year old female presenting for ureteroscopic management of bilateral ureteral/renal calculi.  I talked to her a couple of months ago about treating her stones nonurgently.  She deferred on this.  Because of significant pain 1 week ago, she underwent urgent stenting bilaterally.  At that time urine culture was negative for specific pathogen.  She has been on Keflex for the past 36 hours.  She has had no recent fevers.  Past Medical History:  Diagnosis Date   Anal fissure - posterior 4081   Complication of anesthesia    Pt states she heard her "oxygen dropped during procedure"   Gallstones    GERD (gastroesophageal reflux disease)    History of kidney stones    Hypertension    Morbid obesity ([place])     Past Surgical History:  Procedure Laterality Date   CHOLECYSTECTOMY  1988   CYSTOSCOPY WITH RETROGRADE PYELOGRAM, URETEROSCOPY AND STENT PLACEMENT Left 04/18/2020   Procedure: CYSTOSCOPY WITH RETROGRADE PYELOGRAM, URETEROSCOPY , STONE EXTRACTION AND STENT PLACEMENT;  Surgeon: Franchot Gallo, MD;  Location: [facility];  Service: Urology;  Laterality: Left;  75 MINS   CYSTOSCOPY WITH STENT PLACEMENT Left 01/22/2022   Procedure: CYSTOSCOPY WITH STENT PLACEMENT;  Surgeon: Remi [name], MD;  Location: [facility];  Service: Urology;  Laterality: Left;   CYSTOSCOPY/URETEROSCOPY/HOLMIUM LASER/STENT PLACEMENT Left 07/24/2021   Procedure: CYSTOSCOPY/URETEROSCOPY/HOLMIUM LASER/STENT PLACEMENT;  Surgeon: Franchot Gallo, MD;  Location: [facility];  Service: Urology;  Laterality: Left;   DILITATION & CURRETTAGE/HYSTROSCOPY WITH HYDROTHERMAL ABLATION N/A 09/13/2012   Procedure: DILATATION & CURETTAGE/HYSTEROSCOPY WITH HYDROTHERMAL ABLATION;  Surgeon: Osborne Oman, MD;  Location: [facility];  Service: Gynecology;  Laterality: N/A;   HOLMIUM LASER APPLICATION Left 44/81/8563   Procedure: HOLMIUM LASER LITHOTRIPSY;   Surgeon: Franchot Gallo, MD;  Location: [facility];  Service: Urology;  Laterality: Left;   HOLMIUM LASER APPLICATION Right 1/49/7026   Procedure: HOLMIUM LASER APPLICATION;  Surgeon: Franchot Gallo, MD;  Location: [facility];  Service: Urology;  Laterality: Right;   IR URETERAL STENT RIGHT NEW ACCESS W/O SEP NEPHROSTOMY CATH  07/24/2021   IR US GUIDANCE  07/24/2021   NEPHROLITHOTOMY Right 07/24/2021   Procedure: NEPHROLITHOTOMY PERCUTANEOUS;  Surgeon: Franchot Gallo, MD;  Location: [facility];  Service: Urology;  Laterality: Right;   TUBAL LIGATION  1990    Home Medications:  Medications Prior to Admission  Medication Sig Dispense Refill   cephALEXin (KEFLEX) 500 MG capsule Take 500 mg by mouth 2 (two) times daily.     FEXOFENADINE HCL PO Take 1 tablet by mouth daily.     Probiotic CHEW Chew 1 each by mouth daily.     spironolactone (ALDACTONE) 50 MG tablet TAKE 1 TABLET BY MOUTH EVERY DAY (Patient taking differently: Take 50 mg by mouth daily.) 90 tablet 3   ibuprofen (ADVIL) 200 MG tablet Take 400 mg by mouth 2 (two) times daily as needed for headache or mild pain.      Allergies:  Allergies  Allergen Reactions   Requip [Ropinirole Hcl] Other (See Comments)    Headache Feels "sick"    Family History  Problem Relation Age of Onset   Stroke Mother    Kidney disease Father    Coronary artery disease Father    Aneurysm Father        AAA   Hypertension Father    Seizures Sister    Heart attack Maternal  Grandfather    Cancer Sister        ovarian/uterus   Tongue cancer Sister    Colon cancer Neg Hx    Stomach cancer Neg Hx    Urticaria Neg Hx    Immunodeficiency Neg Hx    Eczema Neg Hx    Atopy Neg Hx    Asthma Neg Hx    Angioedema Neg Hx    Allergic rhinitis Neg Hx     Social History:  reports that she quit smoking about 19 years ago. Her smoking use included cigarettes. She has a 1.25 pack-year smoking history. She has never used smokeless tobacco. She reports that she  does not drink alcohol and does not use drugs.  ROS: A complete review of systems was performed.  All systems are negative except for pertinent findings as noted.  Physical Exam:  Vital signs in last 24 hours: Temp:  [98.3 F (36.8 C)] 98.3 F (36.8 C) (08/24 0803) Pulse Rate:  [88] 88 (08/24 0803) Resp:  [16] 16 (08/24 0803) BP: (128)/(86) 128/86 (08/24 0803) SpO2:  [97 %] 97 % (08/24 0803) Weight:  [117.9 kg] 117.9 kg (08/23 1314) General:  Alert and oriented, No acute distress HEENT: Normocephalic, atraumatic Neck: No JVD or lymphadenopathy Cardiovascular: Regular rate  Lungs: Normal inspiratory/expiratory excursion  Extremities: No edema Neurologic: Grossly intact  I have reviewed prior pt notes  I have reviewed urinalysis results  I have independently reviewed prior imaging  I have reviewed prior urine culture   Impression/Assessment:  Bilateral urolithiasis, obstructive, status post recent stenting  Plan:  Cystoscopy, bilateral double-J stent exchange, bilateral ureteroscopy, holmium laser treatment of bilateral calculus disease  Michele Hall Michele Hall 01/29/2022, 8:56 AM  Michele Hall. Michele Zywicki MD

## 2022-01-29 NOTE — Anesthesia Procedure Notes (Addendum)
Procedure Name: LMA Insertion Date/Time: 01/29/2022 9:15 AM  Performed by: Victoriano Lain, CRNAPre-anesthesia Checklist: Patient identified, Emergency Drugs available, Suction available, Patient being monitored and Timeout performed Patient Re-evaluated:Patient Re-evaluated prior to induction Oxygen Delivery Method: Circle system utilized Preoxygenation: Pre-oxygenation with 100% oxygen Induction Type: IV induction LMA: LMA with gastric port inserted LMA Size: 4.0 Number of attempts: 1 Placement Confirmation: positive ETCO2 and breath sounds checked- equal and bilateral Tube secured with: Tape Dental Injury: Teeth and Oropharynx as per pre-operative assessment  Comments: LMA placed with ease. Pt noted spot to inside of lower lip from her procedure a week ago. Area noted to raised and slightly inflamed. Site unchanged after LMA placement today. Lubrication applied to lips

## 2022-01-29 NOTE — Anesthesia Postprocedure Evaluation (Signed)
Anesthesia Post Note  Patient: Michele Hall  Procedure(s) Performed: CYSTOSCOPY WITH RETROGRADE PYELOGRAM, URETEROSCOPY, STONE EXTRACTION  AND STENT PLACEMENT holmium laser (Bilateral: Ureter) HOLMIUM LASER APPLICATION (Bilateral)     Patient location during evaluation: PACU Anesthesia Type: General Level of consciousness: awake and alert Pain management: pain level controlled Vital Signs Assessment: post-procedure vital signs reviewed and stable Respiratory status: spontaneous breathing, nonlabored ventilation, respiratory function stable and patient connected to nasal cannula oxygen Cardiovascular status: blood pressure returned to baseline and stable Postop Assessment: no apparent nausea or vomiting Anesthetic complications: no   No notable events documented.  Last Vitals:  Vitals:   01/29/22 1205 01/29/22 1215  BP:  131/82  Pulse: (!) 106 (!) 102  Resp: 19 16  Temp:  (!) 36.4 C  SpO2: 94% 94%    Last Pain:  Vitals:   01/29/22 1215  TempSrc:   PainSc: 2                  Treyshaun Keatts

## 2022-01-29 NOTE — Transfer of Care (Signed)
Immediate Anesthesia Transfer of Care Note  Patient: Michele Hall  Procedure(s) Performed: CYSTOSCOPY WITH RETROGRADE PYELOGRAM, URETEROSCOPY, STONE EXTRACTION  AND STENT PLACEMENT holmium laser (Bilateral: Ureter) HOLMIUM LASER APPLICATION (Bilateral)  Patient Location: PACU  Anesthesia Type:General  Level of Consciousness: awake, alert , oriented and patient cooperative  Airway & Oxygen Therapy: Patient Spontanous Breathing and Patient connected to face mask oxygen  Post-op Assessment: Report given to RN, Post -op Vital signs reviewed and stable and Patient moving all extremities  Post vital signs: Reviewed and stable  Last Vitals:  Vitals Value Taken Time  BP 110/67 01/29/22 1108  Temp    Pulse 91 01/29/22 1110  Resp 21 01/29/22 1110  SpO2 99 % 01/29/22 1110  Vitals shown include unvalidated device data.  Last Pain:  Vitals:   01/29/22 0803  TempSrc: Oral  PainSc: 3       Patients Stated Pain Goal: 0 (88/32/54 9826)  Complications: No notable events documented.

## 2022-01-29 NOTE — Discharge Instructions (Signed)

## 2022-01-30 ENCOUNTER — Encounter (HOSPITAL_COMMUNITY): Payer: Self-pay | Admitting: Urology

## 2022-01-30 LAB — URINE CULTURE: Culture: NO GROWTH

## 2022-02-11 ENCOUNTER — Telehealth: Payer: Self-pay | Admitting: Family Medicine

## 2022-02-11 ENCOUNTER — Other Ambulatory Visit: Payer: Self-pay | Admitting: Family Medicine

## 2022-02-11 NOTE — Telephone Encounter (Signed)
BP Readings from Last 3 Encounters:  01/29/22 131/82  01/22/22 125/83  10/29/21 114/90

## 2022-05-28 ENCOUNTER — Other Ambulatory Visit: Payer: Self-pay | Admitting: Family Medicine

## 2022-05-28 NOTE — Telephone Encounter (Signed)
Spoke pt, scheduled cpe for 07/01/22

## 2022-05-28 NOTE — Telephone Encounter (Signed)
Please call and schedule CPE with fasting labs prior with Dr. Bedsole. 

## 2022-06-16 ENCOUNTER — Telehealth: Payer: Self-pay | Admitting: Family Medicine

## 2022-06-16 DIAGNOSIS — E7800 Pure hypercholesterolemia, unspecified: Secondary | ICD-10-CM

## 2022-06-16 DIAGNOSIS — Z1159 Encounter for screening for other viral diseases: Secondary | ICD-10-CM

## 2022-06-16 NOTE — Telephone Encounter (Signed)
-----   Message from Velna Hatchet, RT sent at 06/09/2022 12:15 PM EST ----- Regarding: Wed 1/17 lab Patient is scheduled for cpx, please order future labs.  Thanks, Anda Kraft

## 2022-06-24 ENCOUNTER — Other Ambulatory Visit (INDEPENDENT_AMBULATORY_CARE_PROVIDER_SITE_OTHER): Payer: BC Managed Care – PPO

## 2022-06-24 DIAGNOSIS — E7800 Pure hypercholesterolemia, unspecified: Secondary | ICD-10-CM | POA: Diagnosis not present

## 2022-06-24 DIAGNOSIS — Z1159 Encounter for screening for other viral diseases: Secondary | ICD-10-CM

## 2022-06-24 LAB — COMPREHENSIVE METABOLIC PANEL
ALT: 30 U/L (ref 0–35)
AST: 27 U/L (ref 0–37)
Albumin: 4.2 g/dL (ref 3.5–5.2)
Alkaline Phosphatase: 52 U/L (ref 39–117)
BUN: 12 mg/dL (ref 6–23)
CO2: 24 mEq/L (ref 19–32)
Calcium: 8.9 mg/dL (ref 8.4–10.5)
Chloride: 106 mEq/L (ref 96–112)
Creatinine, Ser: 0.75 mg/dL (ref 0.40–1.20)
GFR: 89.62 mL/min (ref >60.00)
Glucose, Bld: 93 mg/dL (ref 70–99)
Potassium: 3.9 mEq/L (ref 3.5–5.1)
Sodium: 140 mEq/L (ref 135–145)
Total Bilirubin: 0.6 mg/dL (ref 0.2–1.2)
Total Protein: 7.5 g/dL (ref 6.0–8.3)

## 2022-06-24 LAB — LIPID PANEL
Cholesterol: 173 mg/dL (ref 0–200)
HDL: 43.4 mg/dL (ref >39.00)
LDL Cholesterol: 97 mg/dL (ref 0–99)
NonHDL: 129.29
Total CHOL/HDL Ratio: 4
Triglycerides: 160 mg/dL — ABNORMAL HIGH (ref 0.0–149.0)
VLDL: 32 mg/dL (ref 0.0–40.0)

## 2022-06-25 LAB — HEPATITIS C ANTIBODY: Hepatitis C Ab: NONREACTIVE

## 2022-06-25 NOTE — Progress Notes (Signed)
No critical labs need to be addressed urgently. We will discuss labs in detail at upcoming office visit.   

## 2022-07-01 ENCOUNTER — Ambulatory Visit (INDEPENDENT_AMBULATORY_CARE_PROVIDER_SITE_OTHER): Payer: BC Managed Care – PPO | Admitting: Family Medicine

## 2022-07-01 ENCOUNTER — Encounter: Payer: Self-pay | Admitting: Family Medicine

## 2022-07-01 VITALS — BP 132/64 | HR 98 | Temp 97.6°F | Resp 16 | Ht 66.25 in | Wt 263.5 lb

## 2022-07-01 DIAGNOSIS — Z0000 Encounter for general adult medical examination without abnormal findings: Secondary | ICD-10-CM | POA: Diagnosis not present

## 2022-07-01 DIAGNOSIS — I10 Essential (primary) hypertension: Secondary | ICD-10-CM | POA: Diagnosis not present

## 2022-07-01 DIAGNOSIS — E7800 Pure hypercholesterolemia, unspecified: Secondary | ICD-10-CM | POA: Diagnosis not present

## 2022-07-01 NOTE — Progress Notes (Signed)
Patient ID: Michele Hall, female    DOB: [birth year], 57 y.o.   MRN: [id number]  This visit was conducted in person.  BP 132/64   Pulse 98   Temp 97.6 F (36.4 C)   Resp 16   Ht 5' 6.25" (1.683 m)   Wt 263 lb 8 oz (119.5 kg)   SpO2 97%   BMI 42.21 kg/m    CC:  Chief Complaint  Patient presents with   Annual Exam    Subjective:   HPI: Michele Hall is a 57 y.o. female presenting on 07/01/2022 for Annual Exam  The patient presents for complete physical and review of chronic health problems. She also has the following acute concerns today: Has seen uro for multiple kidney stones in last year. Reviewed labs in detail.  Elevated Cholesterol:  tolerable control with diet Lab Results  Component Value Date   CHOL 173 06/24/2022   HDL 43.40 06/24/2022   LDLCALC 97 06/24/2022   LDLDIRECT 102.0 04/02/2020   TRIG 160.0 (H) 06/24/2022   CHOLHDL 4 06/24/2022  Using medications without problems: Muscle aches:  Diet compliance: poor.. just started back on keto Exercise: minimal Other complaints:  The 10-year ASCVD risk score (Arnett DK, et al., 2019) is: 3.1%   Values used to calculate the score:     Age: 37 years     Sex: Female     Is Non-Hispanic African American: No     Diabetic: No     Tobacco smoker: No     Systolic Blood Pressure: 637 mmHg     Is BP treated: Yes     HDL Cholesterol: 43.4 mg/dL     Total Cholesterol: 173 mg/dL   Hypertension:   Aldactone 5 mg daily .Marland Kitchen She is not taking regularly.  ON this as HCTZ cause hypokalemia in past. BP Readings from Last 3 Encounters:  07/01/22 132/64  01/29/22 131/82  01/22/22 125/83  Using medication without problems or lightheadedness: none Chest pain with exertion:none Edema:none Short of breath: none Average home BPs: Other issues:  Wt Readings from Last 3 Encounters:  07/01/22 263 lb 8 oz (119.5 kg)  01/28/22 260 lb (117.9 kg)  01/22/22 264 lb 12.8 oz (120.1 kg)        Relevant past medical, surgical,  family and social history reviewed and updated as indicated. Interim medical history since our last visit reviewed. Allergies and medications reviewed and updated. Outpatient Medications Prior to Visit  Medication Sig Dispense Refill   indapamide (LOZOL) 2.5 MG tablet Take 2.5 mg by mouth every morning.     tamsulosin (FLOMAX) 0.4 MG CAPS capsule Take 0.4 mg by mouth daily.     spironolactone (ALDACTONE) 50 MG tablet TAKE 1 TABLET BY MOUTH EVERY DAY 90 tablet 0   cephALEXin (KEFLEX) 500 MG capsule Take 500 mg by mouth 2 (two) times daily. (Patient not taking: Reported on 07/01/2022)     FEXOFENADINE HCL PO Take 1 tablet by mouth daily. (Patient not taking: Reported on 07/01/2022)     ibuprofen (ADVIL) 200 MG tablet Take 400 mg by mouth 2 (two) times daily as needed for headache or mild pain. (Patient not taking: Reported on 07/01/2022)     Probiotic CHEW Chew 1 each by mouth daily. (Patient not taking: Reported on 07/01/2022)     No facility-administered medications prior to visit.     Per HPI unless specifically indicated in ROS section below Review of Systems  Constitutional:  Negative for fatigue  and fever.  HENT:  Negative for congestion.   Eyes:  Negative for pain.  Respiratory:  Negative for cough and shortness of breath.   Cardiovascular:  Negative for chest pain, palpitations and leg swelling.  Gastrointestinal:  Negative for abdominal pain.  Genitourinary:  Negative for dysuria and vaginal bleeding.  Musculoskeletal:  Negative for back pain.  Neurological:  Negative for syncope, light-headedness and headaches.  Psychiatric/Behavioral:  Negative for dysphoric mood.     Objective:  BP 132/64   Pulse 98   Temp 97.6 F (36.4 C)   Resp 16   Ht 5' 6.25" (1.683 m)   Wt 263 lb 8 oz (119.5 kg)   SpO2 97%   BMI 42.21 kg/m   Wt Readings from Last 3 Encounters:  07/01/22 263 lb 8 oz (119.5 kg)  01/28/22 260 lb (117.9 kg)  01/22/22 264 lb 12.8 oz (120.1 kg)      Physical  Exam Vitals and nursing note reviewed.  Constitutional:      General: She is not in acute distress.    Appearance: Normal appearance. She is well-developed. She is not ill-appearing or toxic-appearing.  HENT:     Head: Normocephalic.     Right Ear: Hearing, tympanic membrane, ear canal and external ear normal.     Left Ear: Hearing, tympanic membrane, ear canal and external ear normal.     Nose: Nose normal.  Eyes:     General: Lids are normal. Lids are everted, no foreign bodies appreciated.     Conjunctiva/sclera: Conjunctivae normal.     Pupils: Pupils are equal, round, and reactive to light.  Neck:     Thyroid: No thyroid mass or thyromegaly.     Vascular: No carotid bruit.     Trachea: Trachea normal.  Cardiovascular:     Rate and Rhythm: Normal rate and regular rhythm.     Heart sounds: Normal heart sounds, S1 normal and S2 normal. No murmur heard.    No gallop.  Pulmonary:     Effort: Pulmonary effort is normal. No respiratory distress.     Breath sounds: Normal breath sounds. No wheezing, rhonchi or rales.  Abdominal:     General: Bowel sounds are normal. There is no distension or abdominal bruit.     Palpations: Abdomen is soft. There is no fluid wave or mass.     Tenderness: There is no abdominal tenderness. There is no guarding or rebound.     Hernia: No hernia is present.  Musculoskeletal:     Cervical back: Normal range of motion and neck supple.  Lymphadenopathy:     Cervical: No cervical adenopathy.  Skin:    General: Skin is warm and dry.     Findings: No rash.  Neurological:     Mental Status: She is alert.     Cranial Nerves: No cranial nerve deficit.     Sensory: No sensory deficit.  Psychiatric:        Mood and Affect: Mood is not anxious or depressed.        Speech: Speech normal.        Behavior: Behavior normal. Behavior is cooperative.        Judgment: Judgment normal.       Results for orders placed or performed in visit on 06/24/22   Comprehensive metabolic panel  Result Value Ref Range   Sodium 140 135 - 145 mEq/L   Potassium 3.9 3.5 - 5.1 mEq/L   Chloride 106 96 - 112 mEq/L  CO2 24 19 - 32 mEq/L   Glucose, Bld 93 70 - 99 mg/dL   BUN 12 6 - 23 mg/dL   Creatinine, Ser 0.75 0.40 - 1.20 mg/dL   Total Bilirubin 0.6 0.2 - 1.2 mg/dL   Alkaline Phosphatase 52 39 - 117 U/L   AST 27 0 - 37 U/L   ALT 30 0 - 35 U/L   Total Protein 7.5 6.0 - 8.3 g/dL   Albumin 4.2 3.5 - 5.2 g/dL   GFR 89.62 >60.00 mL/min   Calcium 8.9 8.4 - 10.5 mg/dL  Lipid panel  Result Value Ref Range   Cholesterol 173 0 - 200 mg/dL   Triglycerides 160.0 (H) 0.0 - 149.0 mg/dL   HDL 43.40 >39.00 mg/dL   VLDL 32.0 0.0 - 40.0 mg/dL   LDL Cholesterol 97 0 - 99 mg/dL   Total CHOL/HDL Ratio 4    NonHDL 129.29   Hepatitis C antibody  Result Value Ref Range   Hepatitis C Ab NON-REACTIVE NON-REACTIVE    Assessment and Plan The patient's preventative maintenance and recommended screening tests for an annual wellness exam were reviewed in full today. Brought up to date unless services declined.  Counselled on the importance of diet, exercise, and its role in overall health and mortality. The patient's FH and SH was reviewed, including their home life, tobacco status, and drug and alcohol status.   Vaccines: Uptodate withTdap, refused flu Consider shingrix.  Discussed COVID19 vaccine side effects and benefits. Strongly encouraged the patient to get the vaccine. Questions answered. Pap/DVE:  per GYN 01/2021 Mammo: 01/2021 Bone Density: not indicated Colon:  GI Dr. Carlean Purl 09/2013 nml colonoscopy repeat in 2025 Smoking Status: former smoker QUIT 2003  HIV screen:   refused  No ETOH    Routine general medical examination at a health care facility  Pure hypercholesterolemia Assessment & Plan: Chronic, tolerable control with diet  The 10-year ASCVD risk score (Arnett DK, et al., 2019) is: 3.1%   Values used to calculate the score:     Age: 66  years     Sex: Female     Is Non-Hispanic African American: No     Diabetic: No     Tobacco smoker: No     Systolic Blood Pressure: 213 mmHg     Is BP treated: Yes     HDL Cholesterol: 43.4 mg/dL     Total Cholesterol: 173 mg/dL    Essential hypertension, benign Assessment & Plan: Stable, chronic.    Now on indapamide given renal stoines... stay off spironolactone and follow BP at home   Morbid obesity ([place]) Assessment & Plan: Encouraged exercise, weight loss, healthy eating habits.       Return for anuual physical with fasting labs prior.   Eliezer Lofts, MD

## 2022-07-01 NOTE — Assessment & Plan Note (Signed)
Chronic, tolerable control with diet  The 10-year ASCVD risk score (Arnett DK, et al., 2019) is: 3.1%   Values used to calculate the score:     Age: 57 years     Sex: Female     Is Non-Hispanic African American: No     Diabetic: No     Tobacco smoker: No     Systolic Blood Pressure: 606 mmHg     Is BP treated: Yes     HDL Cholesterol: 43.4 mg/dL     Total Cholesterol: 173 mg/dL

## 2022-07-01 NOTE — Assessment & Plan Note (Signed)
Encouraged exercise, weight loss, healthy eating habits. ? ?

## 2022-07-01 NOTE — Patient Instructions (Addendum)
Stay off spironolactone.  Continue indapamide for both BP and stones.  Follow  BP at home.Marland Kitchen goal < 140/90.  Please call the location of your choice from the menu below to schedule your Mammogram and/or Bone Density appointment.    [facility]                      Phone:  [phone], [place] [address]                                                             Services: Traditional and 3D Mammogram, [place] Bone Density                 Phone: [phone] [address], [place] [address]    Service: Bone Density ONLY   *this site does NOT perform mammograms  [place]                        Phone:  [phone] [address], [place] [address]                                            Services:  3D Mammogram and [place] at [facility]   Phone:  [phone]   [place],  [address]                                            Services: 3D Mammogram and [place]  [place] at [facility])  Phone:  [phone]   [address]  [place], [place] [address]                                              Services:  3D Mammogram and Bone Density  

## 2022-07-01 NOTE — Assessment & Plan Note (Addendum)
Stable, chronic.    Now on indapamide given renal stoines... stay off spironolactone and follow BP at home

## 2022-08-15 ENCOUNTER — Other Ambulatory Visit: Payer: Self-pay | Admitting: Family Medicine

## 2022-08-23 ENCOUNTER — Other Ambulatory Visit: Payer: Self-pay | Admitting: Family Medicine

## 2022-10-13 ENCOUNTER — Encounter (HOSPITAL_COMMUNITY): Payer: Self-pay

## 2022-10-13 ENCOUNTER — Observation Stay (HOSPITAL_COMMUNITY)
Admission: EM | Admit: 2022-10-13 | Discharge: 2022-10-14 | Disposition: A | Discharge status: Home / Self Care | Payer: BC Managed Care – PPO | Attending: Urology | Admitting: Urology

## 2022-10-13 ENCOUNTER — Other Ambulatory Visit: Payer: Self-pay

## 2022-10-13 ENCOUNTER — Emergency Department (HOSPITAL_COMMUNITY): Payer: BC Managed Care – PPO

## 2022-10-13 DIAGNOSIS — Z79899 Other long term (current) drug therapy: Secondary | ICD-10-CM | POA: Insufficient documentation

## 2022-10-13 DIAGNOSIS — N202 Calculus of kidney with calculus of ureter: Secondary | ICD-10-CM | POA: Diagnosis not present

## 2022-10-13 DIAGNOSIS — Z87891 Personal history of nicotine dependence: Secondary | ICD-10-CM | POA: Insufficient documentation

## 2022-10-13 DIAGNOSIS — N201 Calculus of ureter: Secondary | ICD-10-CM | POA: Diagnosis present

## 2022-10-13 DIAGNOSIS — R109 Unspecified abdominal pain: Secondary | ICD-10-CM | POA: Diagnosis present

## 2022-10-13 DIAGNOSIS — I10 Essential (primary) hypertension: Secondary | ICD-10-CM | POA: Insufficient documentation

## 2022-10-13 DIAGNOSIS — N200 Calculus of kidney: Secondary | ICD-10-CM

## 2022-10-13 LAB — CBC WITH DIFFERENTIAL/PLATELET
Abs Immature Granulocytes: 0.03 10*3/uL (ref 0.00–0.07)
Basophils Absolute: 0.1 10*3/uL (ref 0.0–0.1)
Basophils Relative: 1 %
Eosinophils Absolute: 0.1 10*3/uL (ref 0.0–0.5)
Eosinophils Relative: 1 %
HCT: 42 % (ref 36.0–46.0)
Hemoglobin: 14.7 g/dL (ref 12.0–15.0)
Immature Granulocytes: 0 %
Lymphocytes Relative: 16 %
Lymphs Abs: 1.5 10*3/uL (ref 0.7–4.0)
MCH: 32.6 pg (ref 26.0–34.0)
MCHC: 35 g/dL (ref 30.0–36.0)
MCV: 93.1 fL (ref 80.0–100.0)
Monocytes Absolute: 1.1 10*3/uL — ABNORMAL HIGH (ref 0.1–1.0)
Monocytes Relative: 12 %
Neutro Abs: 6.4 10*3/uL (ref 1.7–7.7)
Neutrophils Relative %: 70 %
Platelets: 220 10*3/uL (ref 150–400)
RBC: 4.51 MIL/uL (ref 3.87–5.11)
RDW: 12.1 % (ref 11.5–15.5)
WBC: 9.2 10*3/uL (ref 4.0–10.5)
nRBC: 0 % (ref 0.0–0.2)

## 2022-10-13 LAB — URINALYSIS, ROUTINE W REFLEX MICROSCOPIC
Bilirubin Urine: NEGATIVE
Glucose, UA: NEGATIVE mg/dL
Ketones, ur: NEGATIVE mg/dL
Nitrite: NEGATIVE
Protein, ur: NEGATIVE mg/dL
Specific Gravity, Urine: 1.01 (ref 1.005–1.030)
pH: 6 (ref 5.0–8.0)

## 2022-10-13 LAB — COMPREHENSIVE METABOLIC PANEL
ALT: 30 U/L (ref 0–44)
AST: 26 U/L (ref 15–41)
Albumin: 4.1 g/dL (ref 3.5–5.0)
Alkaline Phosphatase: 53 U/L (ref 38–126)
Anion gap: 9 (ref 5–15)
BUN: 13 mg/dL (ref 6–20)
CO2: 23 mmol/L (ref 22–32)
Calcium: 8.9 mg/dL (ref 8.9–10.3)
Chloride: 103 mmol/L (ref 98–111)
Creatinine, Ser: 1.08 mg/dL — ABNORMAL HIGH (ref 0.44–1.00)
GFR, Estimated: >60 mL/min (ref >60)
Glucose, Bld: 105 mg/dL — ABNORMAL HIGH (ref 70–99)
Potassium: 3.8 mmol/L (ref 3.5–5.1)
Sodium: 135 mmol/L (ref 135–145)
Total Bilirubin: 0.9 mg/dL (ref 0.3–1.2)
Total Protein: 7.7 g/dL (ref 6.5–8.1)

## 2022-10-13 MED ORDER — MORPHINE SULFATE (PF) 4 MG/ML IV SOLN
4.0000 mg | Freq: Once | INTRAVENOUS | Status: AC
  Administered 2022-10-13: 4 mg via INTRAVENOUS
  Filled 2022-10-13: qty 1

## 2022-10-13 MED ORDER — ONDANSETRON HCL 4 MG/2ML IJ SOLN
4.0000 mg | Freq: Once | INTRAMUSCULAR | Status: AC
Start: 2022-10-13 — End: 2022-10-13
  Administered 2022-10-13: 4 mg via INTRAVENOUS
  Filled 2022-10-13: qty 2

## 2022-10-13 MED ORDER — FENTANYL CITRATE PF 50 MCG/ML IJ SOSY: 50.0000 ug | PREFILLED_SYRINGE | Freq: Once | INTRAMUSCULAR | Status: DC

## 2022-10-13 MED ORDER — KETOROLAC TROMETHAMINE 30 MG/ML IJ SOLN
15.0000 mg | Freq: Once | INTRAMUSCULAR | Status: AC
  Administered 2022-10-13: 15 mg via INTRAVENOUS
  Filled 2022-10-13: qty 1

## 2022-10-13 NOTE — ED Notes (Signed)
Pt back from CT

## 2022-10-13 NOTE — ED Triage Notes (Signed)
Patient has had left sided flank pain that started this morning. Took 5mg  oxycodone at 2:20pm. No burning urination. History of kidney stones.

## 2022-10-13 NOTE — ED Provider Notes (Signed)
[place] EMERGENCY DEPARTMENT AT [facility] Provider Note   CSN: [id number] Arrival date & time: 10/13/22  1748     History  Chief Complaint  Patient presents with   Flank Pain    MAKHALA PARKMAN is a 57 y.o. female.  HPI    57 year old female with history of multiple kidney stones, including several that required surgical intervention comes in with chief complaint of flank pain.  Patient indicates that she started noticing left flank pain earlier this morning.  She took her leftover oxycodone and ibuprofen, but had no relief.  Her pain has started worsening, therefore she decided to come to the emergency room.  Review of system is negative for any UTI-like symptoms, fevers, chills.  Home Medications Prior to Admission medications   Medication Sig Start Date End Date Taking? Authorizing Provider  indapamide (LOZOL) 2.5 MG tablet Take 2.5 mg by mouth every morning. 05/29/22   [provider]  tamsulosin (FLOMAX) 0.4 MG CAPS capsule Take 0.4 mg by mouth daily. 06/16/22   [provider]      Allergies    Requip [ropinirole hcl]    Review of Systems   Review of Systems  All other systems reviewed and are negative.   Physical Exam Updated Vital Signs BP 127/74   Pulse 85   Temp 97.8 F (36.6 C) (Oral)   Resp 18   Ht 5\' 6"  (1.676 m)   Wt 117.9 kg   SpO2 94%   BMI 41.97 kg/m  Physical Exam Vitals and nursing note reviewed.  Constitutional:      Appearance: She is well-developed.  HENT:     Head: Normocephalic and atraumatic.  Eyes:     Extraocular Movements: Extraocular movements intact.  Cardiovascular:     Rate and Rhythm: Normal rate.  Pulmonary:     Effort: Pulmonary effort is normal.  Musculoskeletal:     Cervical back: Normal range of motion and neck supple.  Skin:    General: Skin is dry.  Neurological:     Mental Status: She is alert and oriented to person, place, and time.     ED Results / Procedures / Treatments    Labs (all labs ordered are listed, but only abnormal results are displayed) Labs Reviewed  CBC WITH DIFFERENTIAL/PLATELET - Abnormal; Notable for the following components:      Result Value   Monocytes Absolute 1.1 (*)    All other components within normal limits  URINALYSIS, ROUTINE W REFLEX MICROSCOPIC - Abnormal; Notable for the following components:   Hgb urine dipstick SMALL (*)    Leukocytes,Ua LARGE (*)    Bacteria, UA RARE (*)    All other components within normal limits  COMPREHENSIVE METABOLIC PANEL - Abnormal; Notable for the following components:   Glucose, Bld 105 (*)    Creatinine, Ser 1.08 (*)    All other components within normal limits    EKG None  Radiology CT Renal Stone Study  Result Date: 10/13/2022 CLINICAL DATA:  Abdominal and flank pain. EXAM: CT ABDOMEN AND PELVIS WITHOUT CONTRAST TECHNIQUE: Multidetector CT imaging of the abdomen and pelvis was performed following the standard protocol without IV contrast. RADIATION DOSE REDUCTION: This exam was performed according to the departmental dose-optimization program which includes automated exposure control, adjustment of the mA and/or kV according to patient size and/or use of iterative reconstruction technique. COMPARISON:  CT abdomen and pelvis 01/22/2022 FINDINGS: Lower chest: No acute abnormality. Hepatobiliary: No focal liver abnormality is seen.  Status post cholecystectomy. No biliary dilatation. Pancreas: Unremarkable. No pancreatic ductal dilatation or surrounding inflammatory changes. Spleen: Normal in size without focal abnormality. Adrenals/Urinary Tract: There are 2 calculi in the proximal left ureter measuring 4 mm more proximally and 7 mm distally. These are unchanged in size and position. Severe left hydronephrosis is again seen. There is increased moderate left perinephric fat stranding. Punctate bilateral renal calculi are seen. There is no right-sided hydronephrosis. The adrenal glands and bladder are  within normal limits. Stomach/Bowel: Stomach is within normal limits. Appendix appears normal. No evidence of bowel wall thickening, distention, or inflammatory changes. Vascular/Lymphatic: Aortic atherosclerosis. No enlarged abdominal or pelvic lymph nodes. Reproductive: Uterus and bilateral adnexa are unremarkable. Other: Small fat containing umbilical hernia.  No ascites. Musculoskeletal: No acute or significant osseous findings. IMPRESSION: 1. 2 calculi in the proximal left ureter are unchanged in size and position. Severe left hydronephrosis is unchanged. There is increased left perinephric fat stranding. Correlate clinically for superimposed infection. 2. Punctate nonobstructing bilateral renal calculi. Aortic Atherosclerosis (ICD10-I70.0). Electronically Signed   By: Darliss Cheney M.D.   On: 10/13/2022 19:43    Procedures Procedures    Medications Ordered in ED Medications  morphine (PF) 4 MG/ML injection 4 mg (has no administration in time range)  ketorolac (TORADOL) 30 MG/ML injection 15 mg (15 mg Intravenous Given 10/13/22 1933)  ondansetron (ZOFRAN) injection 4 mg (4 mg Intravenous Given 10/13/22 1933)    ED Course/ Medical Decision Making/ A&P                             Medical Decision Making Amount and/or Complexity of Data Reviewed Radiology: ordered.  Risk Prescription drug management.   57 year old female comes in with chief complaint of flank pain. She has history of multiple kidney stones, both sides, and has required multiple surgical intervention.  Collateral history provided by patient's husband.  Have also reviewed patient's previous discharge summaries and urologic procedures.  CT renal stone ordered, it was independently interpreted.  CT scan shows left-sided hydronephrosis.  Per radiologist, there are 2 ureteral stones measuring 4 and 7 mm.  Patient received IV Toradol upfront.  On reassessment, she states that her pain only improved slightly.  She still  uncomfortable.  She does not feel comfortable going home at this time.  I have consulted urology.  Clinically she is not septic.  Final Clinical Impression(s) / ED Diagnoses Final diagnoses:  Kidney stone    Rx / DC Orders ED Discharge Orders     None         Derwood Kaplan, MD 10/13/22 2345

## 2022-10-13 NOTE — ED Notes (Signed)
Patient transported to CT 

## 2022-10-13 NOTE — ED Notes (Signed)
Urine sent with culture

## 2022-10-14 ENCOUNTER — Observation Stay (HOSPITAL_COMMUNITY): Payer: BC Managed Care – PPO | Admitting: Anesthesiology

## 2022-10-14 ENCOUNTER — Encounter (HOSPITAL_COMMUNITY)
Admission: EM | Disposition: A | Discharge status: Home / Self Care | Payer: Self-pay | Source: Home / Self Care | Attending: Emergency Medicine

## 2022-10-14 ENCOUNTER — Observation Stay (HOSPITAL_COMMUNITY): Payer: BC Managed Care – PPO

## 2022-10-14 ENCOUNTER — Other Ambulatory Visit: Payer: Self-pay | Admitting: Urology

## 2022-10-14 DIAGNOSIS — N201 Calculus of ureter: Secondary | ICD-10-CM

## 2022-10-14 HISTORY — PX: CYSTOSCOPY WITH URETEROSCOPY, STONE BASKETRY AND STENT PLACEMENT: SHX6378

## 2022-10-14 HISTORY — PX: HOLMIUM LASER APPLICATION: SHX5852

## 2022-10-14 SURGERY — CYSTOSCOPY, WITH CALCULUS MANIPULATION OR REMOVAL
Anesthesia: General | Laterality: Left

## 2022-10-14 MED ORDER — LIDOCAINE HCL URETHRAL/MUCOSAL 2 % EX GEL
CUTANEOUS | Status: AC
  Filled 2022-10-14: qty 30

## 2022-10-14 MED ORDER — DOCUSATE SODIUM 100 MG PO CAPS
100.0000 mg | ORAL_CAPSULE | Freq: Two times a day (BID) | ORAL | Status: DC
  Administered 2022-10-14: 100 mg via ORAL
  Filled 2022-10-14: qty 1

## 2022-10-14 MED ORDER — KETOROLAC TROMETHAMINE 15 MG/ML IJ SOLN
15.0000 mg | Freq: Four times a day (QID) | INTRAMUSCULAR | Status: DC
  Administered 2022-10-14: 15 mg via INTRAVENOUS
  Filled 2022-10-14: qty 1

## 2022-10-14 MED ORDER — NITROFURANTOIN MONOHYD MACRO 100 MG PO CAPS: 100.0000 mg | ORAL_CAPSULE | Freq: Every day | ORAL | 0 refills | Status: AC

## 2022-10-14 MED ORDER — ACETAMINOPHEN 325 MG PO TABS: 650.0000 mg | ORAL_TABLET | ORAL | Status: DC | PRN

## 2022-10-14 MED ORDER — SENNA 8.6 MG PO TABS
1.0000 | ORAL_TABLET | Freq: Two times a day (BID) | ORAL | Status: DC
  Filled 2022-10-14: qty 1

## 2022-10-14 MED ORDER — HYDROMORPHONE HCL 2 MG/ML IJ SOLN
INTRAMUSCULAR | Status: AC
  Filled 2022-10-14: qty 1

## 2022-10-14 MED ORDER — LIDOCAINE HCL URETHRAL/MUCOSAL 2 % EX GEL
CUTANEOUS | Status: DC | PRN
  Administered 2022-10-14: 1 via URETHRAL

## 2022-10-14 MED ORDER — SODIUM CHLORIDE (PF) 0.9 % IJ SOLN
INTRAMUSCULAR | Status: AC
  Filled 2022-10-14: qty 10

## 2022-10-14 MED ORDER — LACTATED RINGERS IV SOLN: INTRAVENOUS | Status: DC | PRN

## 2022-10-14 MED ORDER — LIDOCAINE 2% (20 MG/ML) 5 ML SYRINGE
INTRAMUSCULAR | Status: DC | PRN
  Administered 2022-10-14: 60 mg via INTRAVENOUS

## 2022-10-14 MED ORDER — ONDANSETRON HCL 4 MG/2ML IJ SOLN: 4.0000 mg | INTRAMUSCULAR | Status: DC | PRN

## 2022-10-14 MED ORDER — TAMSULOSIN HCL 0.4 MG PO CAPS
0.4000 mg | ORAL_CAPSULE | Freq: Every day | ORAL | Status: DC
  Administered 2022-10-14: 0.4 mg via ORAL
  Filled 2022-10-14: qty 1

## 2022-10-14 MED ORDER — OXYCODONE HCL 5 MG/5ML PO SOLN: 5.0000 mg | Freq: Once | ORAL | Status: DC | PRN

## 2022-10-14 MED ORDER — FENTANYL CITRATE (PF) 100 MCG/2ML IJ SOLN
INTRAMUSCULAR | Status: AC
  Filled 2022-10-14: qty 2

## 2022-10-14 MED ORDER — KETOROLAC TROMETHAMINE 30 MG/ML IJ SOLN
INTRAMUSCULAR | Status: DC | PRN
  Administered 2022-10-14: 30 mg via INTRAVENOUS

## 2022-10-14 MED ORDER — OXYCODONE HCL 5 MG PO TABS
5.0000 mg | ORAL_TABLET | ORAL | Status: DC | PRN
  Administered 2022-10-14: 5 mg via ORAL
  Filled 2022-10-14: qty 1

## 2022-10-14 MED ORDER — SODIUM CHLORIDE 0.9 % IV SOLN
1.0000 g | INTRAVENOUS | Status: DC
  Administered 2022-10-14: 1 g via INTRAVENOUS
  Filled 2022-10-14: qty 10

## 2022-10-14 MED ORDER — EPHEDRINE 5 MG/ML INJ
INTRAVENOUS | Status: AC
  Filled 2022-10-14: qty 5

## 2022-10-14 MED ORDER — TAMSULOSIN HCL 0.4 MG PO CAPS: 0.4000 mg | ORAL_CAPSULE | Freq: Every day | ORAL | 1 refills | Status: DC

## 2022-10-14 MED ORDER — MIDAZOLAM HCL 2 MG/2ML IJ SOLN
INTRAMUSCULAR | Status: DC | PRN
  Administered 2022-10-14: 2 mg via INTRAVENOUS

## 2022-10-14 MED ORDER — PROPOFOL 10 MG/ML IV BOLUS
INTRAVENOUS | Status: DC | PRN
  Administered 2022-10-14: 50 mg via INTRAVENOUS
  Administered 2022-10-14: 200 mg via INTRAVENOUS

## 2022-10-14 MED ORDER — TAMSULOSIN HCL 0.4 MG PO CAPS: 0.4000 mg | ORAL_CAPSULE | Freq: Every day | ORAL | Status: DC

## 2022-10-14 MED ORDER — OXYCODONE HCL 5 MG PO TABS: 5.0000 mg | ORAL_TABLET | Freq: Four times a day (QID) | ORAL | 0 refills | Status: DC | PRN

## 2022-10-14 MED ORDER — OXYCODONE HCL 5 MG PO TABS: 5.0000 mg | ORAL_TABLET | Freq: Once | ORAL | Status: DC | PRN

## 2022-10-14 MED ORDER — SODIUM CHLORIDE 0.9 % IR SOLN
Status: DC | PRN
  Administered 2022-10-14: 3000 mL via INTRAVESICAL

## 2022-10-14 MED ORDER — HYDROMORPHONE HCL 1 MG/ML IJ SOLN
INTRAMUSCULAR | Status: DC | PRN
  Administered 2022-10-14: .5 mg via INTRAVENOUS

## 2022-10-14 MED ORDER — ONDANSETRON HCL 4 MG/2ML IJ SOLN
INTRAMUSCULAR | Status: DC | PRN
  Administered 2022-10-14: 4 mg via INTRAVENOUS

## 2022-10-14 MED ORDER — PHENYLEPHRINE 80 MCG/ML (10ML) SYRINGE FOR IV PUSH (FOR BLOOD PRESSURE SUPPORT)
PREFILLED_SYRINGE | INTRAVENOUS | Status: DC | PRN
  Administered 2022-10-14: 120 ug via INTRAVENOUS
  Administered 2022-10-14: 160 ug via INTRAVENOUS
  Administered 2022-10-14 (×2): 120 ug via INTRAVENOUS

## 2022-10-14 MED ORDER — MIDAZOLAM HCL 2 MG/2ML IJ SOLN
INTRAMUSCULAR | Status: AC
  Filled 2022-10-14: qty 2

## 2022-10-14 MED ORDER — PHENAZOPYRIDINE HCL 200 MG PO TABS: 200.0000 mg | ORAL_TABLET | Freq: Three times a day (TID) | ORAL | 1 refills | Status: DC | PRN

## 2022-10-14 MED ORDER — SODIUM CHLORIDE 0.9 % IV SOLN: INTRAVENOUS | Status: DC

## 2022-10-14 MED ORDER — IOHEXOL 300 MG/ML  SOLN
INTRAMUSCULAR | Status: DC | PRN
  Administered 2022-10-14: 10 mL via URETHRAL

## 2022-10-14 MED ORDER — ONDANSETRON HCL 4 MG/2ML IJ SOLN: 4.0000 mg | Freq: Four times a day (QID) | INTRAMUSCULAR | Status: DC | PRN

## 2022-10-14 MED ORDER — TAMSULOSIN HCL 0.4 MG PO CAPS
0.4000 mg | ORAL_CAPSULE | ORAL | Status: AC
  Administered 2022-10-14: 0.4 mg via ORAL
  Filled 2022-10-14: qty 1

## 2022-10-14 MED ORDER — PROPOFOL 10 MG/ML IV BOLUS
INTRAVENOUS | Status: AC
  Filled 2022-10-14: qty 20

## 2022-10-14 MED ORDER — DEXAMETHASONE SODIUM PHOSPHATE 4 MG/ML IJ SOLN
INTRAMUSCULAR | Status: DC | PRN
  Administered 2022-10-14: 10 mg via INTRAVENOUS

## 2022-10-14 MED ORDER — MORPHINE SULFATE (PF) 2 MG/ML IV SOLN: 2.0000 mg | INTRAVENOUS | Status: DC | PRN

## 2022-10-14 SURGICAL SUPPLY — 33 items
BAG COUNTER SPONGE SURGICOUNT (BAG) IMPLANT
BAG SPNG CNTER NS LX DISP (BAG)
BAG URO CATCHER STRL LF (MISCELLANEOUS) ×4 IMPLANT
BASKET LASER NITINOL 1.9FR (BASKET) IMPLANT
BASKET ZERO TIP NITINOL 2.4FR (BASKET) IMPLANT
BSKT STON RTRVL 120 1.9FR (BASKET) ×2
BSKT STON RTRVL ZERO TP 2.4FR (BASKET)
CATH URETERAL DUAL LUMEN 10F (MISCELLANEOUS) IMPLANT
CATH URETL OPEN 5X70 (CATHETERS) IMPLANT
CATH URETL OPEN END 6FR 70 (CATHETERS) ×2 IMPLANT
CLOTH BEACON ORANGE TIMEOUT ST (SAFETY) ×4 IMPLANT
EXTRACTOR STONE 1.7FRX115CM (UROLOGICAL SUPPLIES) IMPLANT
GLOVE BIO SURGEON STRL SZ7.5 (GLOVE) ×2 IMPLANT
GLOVE SS BIOGEL STRL SZ 8 (GLOVE) ×2 IMPLANT
GOWN STRL REUS W/ TWL XL LVL3 (GOWN DISPOSABLE) ×4 IMPLANT
GOWN STRL REUS W/TWL XL LVL3 (GOWN DISPOSABLE) ×8
GUIDEWIRE ANG ZIPWIRE 038X150 (WIRE) IMPLANT
GUIDEWIRE STR DUAL SENSOR (WIRE) ×4 IMPLANT
GUIDEWIRE ZIPWRE .038 STRAIGHT (WIRE) IMPLANT
IV NS 1000ML (IV SOLUTION) ×2
IV NS 1000ML BAXH (IV SOLUTION) ×2 IMPLANT
KIT TURNOVER KIT A (KITS) IMPLANT
LASER FIB FLEXIVA PULSE ID 365 (Laser) IMPLANT
MANIFOLD NEPTUNE II (INSTRUMENTS) ×4 IMPLANT
PACK CYSTO (CUSTOM PROCEDURE TRAY) ×4 IMPLANT
SHEATH NAVIGATOR HD 11/13X28 (SHEATH) IMPLANT
SHEATH NAVIGATOR HD 11/13X36 (SHEATH) IMPLANT
SHEATH NAVIGATOR HD 12/14X36 (SHEATH) IMPLANT
STENT URET 6FRX26 CONTOUR (STENTS) IMPLANT
TRACTIP FLEXIVA PULS ID 200XHI (Laser) IMPLANT
TRACTIP FLEXIVA PULSE ID 200 (Laser) ×2
TUBING CONNECTING 10 (TUBING) ×4 IMPLANT
TUBING UROLOGY SET (TUBING) ×4 IMPLANT

## 2022-10-14 NOTE — Op Note (Signed)
OPERATIVE NOTE   Patient Name: Michele Hall  MRN: [id number]   Date of Procedure: 10/14/22  Preoperative diagnosis:  Left ureteral calculus  Postoperative diagnosis:  Left ureteral calculus  Procedure:  Cystoscopy Left retrograde pyelogram Left ureteroscopic laser lithotripsy Stone manipulation (fragments obtained) Insertion of left ureteral stent (9F x 26 cm, no tether) Intra-operative fluoroscopy with interpretation  Attending: Milderd Meager, MD  Anesthesia: General  Estimated blood loss:  0 ml  Fluids: Per anesthesia record  Drains: 9F x 26 cm left ureteral stent, no tether  Specimens: stone fragments (given to patient)  Antibiotics: Rocephin  1 gm IV  Findings: Normal urethra and bladder; 7 mm calculus in the mid left ureter with complete obstruction  Indications:  57 year old female with a history of nephrolithiasis presented to emergency room last night with left flank pain.  CT demonstrated 2 proximal left ureteral calculi with associated obstruction.  No fevers or chills.  She had a normal white blood cell count.  Her creatinine was slightly elevated at 1.08.  Urinalysis showed 6-10 RBCs, 21-50 WBCs, and rare bacteria.  She was admitted to the hospital for pain control.  She presents now for surgical management of the left ureteral calculi with cystoscopy, left retrograde pyelogram, possible ureteroscopy, possible laser lithotripsy, and insertion of left ureteral stent.  Risk and benefits of the procedure have been discussed in detail.  She understands wished to proceed as described.  Description of Procedure:  The patient received IV Rocephin in the emergency room.  After successful induction of a general anesthetic, the patient was placed in the dorsolithotomy position.  The patient's genital area was prepped and draped in sterile fashion.  Under direct visualization, a 22 French rigid cystoscope was passed through the urethra into the bladder.  The bladder  was inspected and noted to be free of any mucosal abnormalities.  A normal-appearing trigone was noted with a single orifice bilaterally.  No stones were seen within the bladder.  A left retrograde pyelogram was performed for evaluation of the patient's upper tract given her findings on CT scan with left ureteral calculi and obstruction.  Scout film showed a nonspecific bowel gas pattern and no obvious bony abnormalities.  An approximately 7-8 mm calcification was noted adjacent to L3 transverse process consistent with the proximal ureteral stone.  A 5 French open-ended catheter was placed into the left ureter.  Contrast was injected.  The distal and mid ureter were normal in appearance.  No contrast was seen beyond the previously noted calcification consistent with a complete obstruction from the ureteral calculus.  A sensor guidewire was carefully placed through the open-ended catheter and with gentle manipulation was able to be passed beyond the calculus.  The wire curled in the left renal pelvis.  Ureteroscopy was then performed alongside the guidewire.  The stone was visualized in the proximal left ureter.  Using the holmium laser fiber, the stone was fragmented into multiple small fragments.  There was significant ureteral edema noted at the stone location.  Once the stone was fragmented I was able to pass the ureteroscope into the proximal left ureter up to the UPJ.  Small stone fragments were noted.  The previously placed sensor guidewire was noted to be submucosal for a short segment at the prior stone location.  I placed a new sensor guidewire under direct visualization through the ureteroscope and removed the previously placed guidewire.  Contrast was injected through the ureteroscope and filled a very dilated left renal pelvis  and calyces.  A stone basket was used to remove several of the larger stone fragments.  I felt that the remaining stone fragments would be small enough to pass spontaneously  and did not feel that continued instrumentation was appropriate.  The ureter was inspected with removal of the ureteroscope and noted to be free of any injury.  A 6 French by 26 cm double-J stent was then passed over the guidewire and into the left renal pelvis.  Stent position was confirmed with fluoroscopy.  The tether was removed prior to stent placement.  The bladder was then drained and the cystoscope was removed.  Intraurethral lidocaine jelly was placed.  The patient was then extubated and taken to the post anesthesia care unit in stable condition.  Complications: None  Condition: Stable, extubated, transferred to PACU  Plan:  D/C Home today Continue left ureteral stent for 10-14 days Will arrange for outpatient follow-up for stent removal

## 2022-10-14 NOTE — Transfer of Care (Signed)
Immediate Anesthesia Transfer of Care Note  Patient: Michele Hall  Procedure(s) Performed: CYSTOSCOPY WITH LEFT URETEROSCOPY, STONE BASKETRY AND LEFTSTENT PLACEMENT,RETROGRADE (Left) HOLMIUM LASER APPLICATION  Patient Location: PACU  Anesthesia Type:General  Level of Consciousness: awake, alert , oriented, and patient cooperative  Airway & Oxygen Therapy: Patient Spontanous Breathing and Patient connected to face mask oxygen  Post-op Assessment: Report given to RN and Post -op Vital signs reviewed and stable  Post vital signs: Reviewed and stable  Last Vitals:  Vitals Value Taken Time  BP 106/62 10/14/22 1708  Temp    Pulse 84 10/14/22 1713  Resp 0 10/14/22 1713  SpO2 97 % 10/14/22 1713  Vitals shown include unvalidated device data.  Last Pain:  Vitals:   10/14/22 1211  TempSrc:   PainSc: 5       Patients Stated Pain Goal: 3 (10/14/22 1211)  Complications: No notable events documented.

## 2022-10-14 NOTE — ED Provider Notes (Signed)
623 just spoke to Dr. Delanna Ahmadi who will see the patient    Michele Blamer, MD 10/14/22 [phone]

## 2022-10-14 NOTE — Anesthesia Procedure Notes (Signed)
Procedure Name: LMA Insertion Date/Time: 10/14/2022 4:10 PM  Performed by: Ponciano Ort, CRNAPre-anesthesia Checklist: Patient identified, Emergency Drugs available, Suction available and Patient being monitored Patient Re-evaluated:Patient Re-evaluated prior to induction Oxygen Delivery Method: Circle system utilized Preoxygenation: Pre-oxygenation with 100% oxygen Induction Type: IV induction Ventilation: Mask ventilation without difficulty LMA: LMA inserted LMA Size: 4.0 Tube type: Oral Number of attempts: 1 Placement Confirmation: positive ETCO2 and breath sounds checked- equal and bilateral Tube secured with: Tape Dental Injury: Teeth and Oropharynx as per pre-operative assessment

## 2022-10-14 NOTE — ED Provider Notes (Signed)
Patient is awaiting urology.  Case d/w Dr. Delanna Ahmadi, patient will be seen between 5-6 am due to an emergency case.     Granvil Djordjevic, MD 10/14/22 4098

## 2022-10-14 NOTE — Interval H&P Note (Signed)
History and Physical Interval Note:  10/14/2022 4:05 PM  Michele Hall  has presented today for surgery, with the diagnosis of Left ureteral stone.  The various methods of treatment have been discussed with the patient and family. After consideration of risks, benefits and other options for treatment, the patient has consented to  Procedure(s): CYSTOSCOPY WITH URETEROSCOPY, STONE BASKETRY AND LEFTSTENT PLACEMENT (Left) as a surgical intervention.  The patient's history has been reviewed, patient examined, no change in status, stable for surgery.  I have reviewed the patient's chart and labs.  Questions were answered to the patient's satisfaction.     Di Kindle

## 2022-10-14 NOTE — H&P (Addendum)
Urology H&P  Physician requesting consult: Derwood Kaplan, MD  Reason for consult: nephrolithiasis  History of Present Illness: Michele Hall is a 57 y.o. female with a PMH of GERD, HTN, nephrolithiasis who presented with left flank pain and CT scan demonstrating proximal left ureteral stones. She reports a history of stones for which she has had several procedures with [facility], last August 2023. She reports she knows when she is going to be able to pass a stone spontaneously and when she needs surgery, and the pain she has been experiencing over the last month makes her strongly believe she needs surgery. She reports pain in her left flank since February that has progressively worsened over time. Yesterday she notes the pain was so severe she was unable to sleep and felt nauseous. She came to the ED for evaluation.  Patient is nontoxic, afebrile. She is voiding spontaneously.  Past Medical History:  Diagnosis Date   Anal fissure - posterior 2015   Complication of anesthesia    Pt states she heard her "oxygen dropped during procedure"   Gallstones    GERD (gastroesophageal reflux disease)    History of kidney stones    Hypertension    Morbid obesity (HCC)     Past Surgical History:  Procedure Laterality Date   CHOLECYSTECTOMY  1988   CYSTOSCOPY WITH RETROGRADE PYELOGRAM, URETEROSCOPY AND STENT PLACEMENT Left 04/18/2020   Procedure: CYSTOSCOPY WITH RETROGRADE PYELOGRAM, URETEROSCOPY , STONE EXTRACTION AND STENT PLACEMENT;  Surgeon: Marcine Matar, MD;  Location: [facility];  Service: Urology;  Laterality: Left;  75 MINS   CYSTOSCOPY WITH RETROGRADE PYELOGRAM, URETEROSCOPY AND STENT PLACEMENT Bilateral 01/29/2022   Procedure: CYSTOSCOPY WITH RETROGRADE PYELOGRAM, URETEROSCOPY, STONE EXTRACTION  AND STENT PLACEMENT holmium laser;  Surgeon: Marcine Matar, MD;  Location: [facility];  Service: Urology;  Laterality: Bilateral;   CYSTOSCOPY WITH STENT PLACEMENT Left 01/22/2022    Procedure: CYSTOSCOPY WITH STENT PLACEMENT;  Surgeon: Belva Agee, MD;  Location: [facility];  Service: Urology;  Laterality: Left;   CYSTOSCOPY/URETEROSCOPY/HOLMIUM LASER/STENT PLACEMENT Left 07/24/2021   Procedure: CYSTOSCOPY/URETEROSCOPY/HOLMIUM LASER/STENT PLACEMENT;  Surgeon: Marcine Matar, MD;  Location: [facility];  Service: Urology;  Laterality: Left;   DILITATION & CURRETTAGE/HYSTROSCOPY WITH HYDROTHERMAL ABLATION N/A 09/13/2012   Procedure: DILATATION & CURETTAGE/HYSTEROSCOPY WITH HYDROTHERMAL ABLATION;  Surgeon: Tereso Newcomer, MD;  Location: [facility];  Service: Gynecology;  Laterality: N/A;   HOLMIUM LASER APPLICATION Left 04/18/2020   Procedure: HOLMIUM LASER LITHOTRIPSY;  Surgeon: Marcine Matar, MD;  Location: [facility];  Service: Urology;  Laterality: Left;   HOLMIUM LASER APPLICATION Right 07/24/2021   Procedure: HOLMIUM LASER APPLICATION;  Surgeon: Marcine Matar, MD;  Location: [facility];  Service: Urology;  Laterality: Right;   HOLMIUM LASER APPLICATION Bilateral 01/29/2022   Procedure: HOLMIUM LASER APPLICATION;  Surgeon: Marcine Matar, MD;  Location: [facility];  Service: Urology;  Laterality: Bilateral;   IR URETERAL STENT RIGHT NEW ACCESS W/O SEP NEPHROSTOMY CATH  07/24/2021   IR US GUIDANCE  07/24/2021   NEPHROLITHOTOMY Right 07/24/2021   Procedure: NEPHROLITHOTOMY PERCUTANEOUS;  Surgeon: Marcine Matar, MD;  Location: [facility];  Service: Urology;  Laterality: Right;   TUBAL LIGATION  1990     Current Hospital Medications:  Home meds:  No current facility-administered medications on file prior to encounter.   Current Outpatient Medications on File Prior to Encounter  Medication Sig Dispense Refill   indapamide (LOZOL) 2.5 MG tablet Take 2.5 mg by mouth every morning.     tamsulosin (FLOMAX)  0.4 MG CAPS capsule Take 0.4 mg by mouth daily.       Scheduled Meds: Continuous Infusions: PRN Meds:.  Allergies:  Allergies  Allergen Reactions   Requip  [Ropinirole Hcl] Other (See Comments)    Headache Feels "sick"    Family History  Problem Relation Age of Onset   Stroke Mother    Kidney disease Father    Coronary artery disease Father    Aneurysm Father        AAA   Hypertension Father    Seizures Sister    Heart attack Maternal Grandfather    Cancer Sister        ovarian/uterus   Tongue cancer Sister    Colon cancer Neg Hx    Stomach cancer Neg Hx    Urticaria Neg Hx    Immunodeficiency Neg Hx    Eczema Neg Hx    Atopy Neg Hx    Asthma Neg Hx    Angioedema Neg Hx    Allergic rhinitis Neg Hx     Social History:  reports that she quit smoking about 20 years ago. Her smoking use included cigarettes. She has a 1.25 pack-year smoking history. She has never used smokeless tobacco. She reports that she does not drink alcohol and does not use drugs.  ROS: A complete review of systems was performed.  All systems are negative except for pertinent findings as noted.  Physical Exam:  Vital signs in last 24 hours: Temp:  [97.8 F (36.6 C)-98.4 F (36.9 C)] 97.9 F (36.6 C) (05/08 0533) Pulse Rate:  [84-96] 84 (05/08 0630) Resp:  [16-18] 18 (05/08 0630) BP: (114-142)/(49-100) 126/65 (05/08 0630) SpO2:  [94 %-99 %] 94 % (05/08 0630) Weight:  [117.9 kg] 117.9 kg (05/07 1813) Constitutional:  Alert and oriented, No acute distress Cardiovascular: Regular rate and rhythm, No JVD Respiratory: Normal respiratory effort, Lungs clear bilaterally GI: Abdomen is soft, nontender, nondistended, no abdominal masses GU: Voiding spontaneously; + L CVA tenderness Lymphatic: No lymphadenopathy Neurologic: Grossly intact, no focal deficits Psychiatric: Normal mood and affect  Laboratory Data:  Recent Labs    10/13/22 1939  WBC 9.2  HGB 14.7  HCT 42.0  PLT 220    Recent Labs    10/13/22 1939  NA 135  K 3.8  CL 103  GLUCOSE 105*  BUN 13  CALCIUM 8.9  CREATININE 1.08*     Results for orders placed or performed during the  hospital encounter of 10/13/22 (from the past 24 hour(s))  Urinalysis, Routine w reflex microscopic -Urine, Clean Catch     Status: Abnormal   Collection Time: 10/13/22  6:13 PM  Result Value Ref Range   Color, Urine YELLOW YELLOW   APPearance CLEAR CLEAR   Specific Gravity, Urine 1.010 1.005 - 1.030   pH 6.0 5.0 - 8.0   Glucose, UA NEGATIVE NEGATIVE mg/dL   Hgb urine dipstick SMALL (A) NEGATIVE   Bilirubin Urine NEGATIVE NEGATIVE   Ketones, ur NEGATIVE NEGATIVE mg/dL   Protein, ur NEGATIVE NEGATIVE mg/dL   Nitrite NEGATIVE NEGATIVE   Leukocytes,Ua LARGE (A) NEGATIVE   RBC / HPF 6-10 0 - 5 RBC/hpf   WBC, UA 21-50 0 - 5 WBC/hpf   Bacteria, UA RARE (A) NONE SEEN   Squamous Epithelial / HPF 0-5 0 - 5 /HPF   Mucus PRESENT    Hyaline Casts, UA PRESENT   CBC with Differential     Status: Abnormal   Collection Time: 10/13/22  7:39 PM  Result Value Ref Range   WBC 9.2 4.0 - 10.5 K/uL   RBC 4.51 3.87 - 5.11 MIL/uL   Hemoglobin 14.7 12.0 - 15.0 g/dL   HCT 16.1 09.6 - 04.5 %   MCV 93.1 80.0 - 100.0 fL   MCH 32.6 26.0 - 34.0 pg   MCHC 35.0 30.0 - 36.0 g/dL   RDW 40.9 81.1 - 91.4 %   Platelets 220 150 - 400 K/uL   nRBC 0.0 0.0 - 0.2 %   Neutrophils Relative % 70 %   Neutro Abs 6.4 1.7 - 7.7 K/uL   Lymphocytes Relative 16 %   Lymphs Abs 1.5 0.7 - 4.0 K/uL   Monocytes Relative 12 %   Monocytes Absolute 1.1 (H) 0.1 - 1.0 K/uL   Eosinophils Relative 1 %   Eosinophils Absolute 0.1 0.0 - 0.5 K/uL   Basophils Relative 1 %   Basophils Absolute 0.1 0.0 - 0.1 K/uL   Immature Granulocytes 0 %   Abs Immature Granulocytes 0.03 0.00 - 0.07 K/uL  Comprehensive metabolic panel     Status: Abnormal   Collection Time: 10/13/22  7:39 PM  Result Value Ref Range   Sodium 135 135 - 145 mmol/L   Potassium 3.8 3.5 - 5.1 mmol/L   Chloride 103 98 - 111 mmol/L   CO2 23 22 - 32 mmol/L   Glucose, Bld 105 (H) 70 - 99 mg/dL   BUN 13 6 - 20 mg/dL   Creatinine, Ser 7.82 (H) 0.44 - 1.00 mg/dL   Calcium 8.9  8.9 - 95.6 mg/dL   Total Protein 7.7 6.5 - 8.1 g/dL   Albumin 4.1 3.5 - 5.0 g/dL   AST 26 15 - 41 U/L   ALT 30 0 - 44 U/L   Alkaline Phosphatase 53 38 - 126 U/L   Total Bilirubin 0.9 0.3 - 1.2 mg/dL   GFR, Estimated >21 >30 mL/min   Anion gap 9 5 - 15   No results found for this or any previous visit (from the past 240 hour(s)).  Renal Function: Recent Labs    10/13/22 1939  CREATININE 1.08*   Estimated Creatinine Clearance: 76.8 mL/min (A) (by C-G formula based on SCr of 1.08 mg/dL (H)).  Radiologic Imaging: CT Renal Stone Study  Result Date: 10/13/2022 CLINICAL DATA:  Abdominal and flank pain. EXAM: CT ABDOMEN AND PELVIS WITHOUT CONTRAST TECHNIQUE: Multidetector CT imaging of the abdomen and pelvis was performed following the standard protocol without IV contrast. RADIATION DOSE REDUCTION: This exam was performed according to the departmental dose-optimization program which includes automated exposure control, adjustment of the mA and/or kV according to patient size and/or use of iterative reconstruction technique. COMPARISON:  CT abdomen and pelvis 01/22/2022 FINDINGS: Lower chest: No acute abnormality. Hepatobiliary: No focal liver abnormality is seen. Status post cholecystectomy. No biliary dilatation. Pancreas: Unremarkable. No pancreatic ductal dilatation or surrounding inflammatory changes. Spleen: Normal in size without focal abnormality. Adrenals/Urinary Tract: There are 2 calculi in the proximal left ureter measuring 4 mm more proximally and 7 mm distally. These are unchanged in size and position. Severe left hydronephrosis is again seen. There is increased moderate left perinephric fat stranding. Punctate bilateral renal calculi are seen. There is no right-sided hydronephrosis. The adrenal glands and bladder are within normal limits. Stomach/Bowel: Stomach is within normal limits. Appendix appears normal. No evidence of bowel wall thickening, distention, or inflammatory changes.  Vascular/Lymphatic: Aortic atherosclerosis. No enlarged abdominal or pelvic lymph nodes. Reproductive: Uterus and bilateral adnexa are unremarkable.  Other: Small fat containing umbilical hernia.  No ascites. Musculoskeletal: No acute or significant osseous findings. IMPRESSION: 1. 2 calculi in the proximal left ureter are unchanged in size and position. Severe left hydronephrosis is unchanged. There is increased left perinephric fat stranding. Correlate clinically for superimposed infection. 2. Punctate nonobstructing bilateral renal calculi. Aortic Atherosclerosis (ICD10-I70.0). Electronically Signed   By: Darliss Cheney M.D.   On: 10/13/2022 19:43    I independently reviewed the above imaging studies.  Impression/Recommendation: 72F with left proximal obstructing ureteral stones and intractable pain. She will be admitted to Urology for pain control and stent vs ureteroscopic stone extraction later today.  - Admit to Urology, Dr. Alvester Morin - OR for L ureteral stent vs ureteroscopic stone extraction with Dr. Alvester Morin vs on-call physican - PRN analgesia - NPO, mIVF - Strain all voided urine - Peri-op CTX - Anticipate discharge after OR if patient tolerates procedure well.  Michele Hall 10/14/2022, 8:12 AM   Chart reviewed.  Agree with above. Patient examined.  Labs and imaging studies personally reviewed.   Options for management discussed with patient including stent insertion, ureteroscopic laser lithotripsy, and shock wave lithotripsy. Plan to proceed with cystoscopy, left retrograde pyelogram, possible ureteroscopy with laser lithotripsy, and insertion of left ureteral stent today.  Risks, benefits, alternatives were discussed with the patient in detail.  Potential risks including, but not limited to, infection; bleeding;  injury to urethra, bladder, or ureter; possible need of other treatments; possible failure to remove the calculus; ureteral  stricture formation; cardiac, pulmonary,  cerebrovascular events; and anesthetic complications were discussed.  The patient understands and wishes to proceed.  Michele Meager, MD [facility] [phone]

## 2022-10-14 NOTE — Anesthesia Preprocedure Evaluation (Addendum)
Anesthesia Evaluation  Patient identified by MRN, date of birth, ID band Patient awake    Reviewed: Allergy & Precautions, H&P , NPO status , Patient's Chart, lab work & pertinent test results  Airway Mallampati: II   Neck ROM: full    Dental   Pulmonary former smoker   Pulmonary exam normal breath sounds clear to auscultation       Cardiovascular hypertension, Pt. on medications Normal cardiovascular exam Rhythm:regular Rate:Normal     Neuro/Psych negative neurological ROS  negative psych ROS   GI/Hepatic Neg liver ROS,GERD  ,,  Endo/Other    Morbid obesity  Renal/GU Renal diseasestones     Musculoskeletal negative musculoskeletal ROS (+)    Abdominal   Peds  Hematology negative hematology ROS (+)   Anesthesia Other Findings   Reproductive/Obstetrics                             Anesthesia Physical Anesthesia Plan  ASA: 3  Anesthesia Plan: General   Post-op Pain Management: Minimal or no pain anticipated   Induction: Intravenous  PONV Risk Score and Plan: 3 and Ondansetron and Dexamethasone  Airway Management Planned: LMA  Additional Equipment: None  Intra-op Plan:   Post-operative Plan: Extubation in OR  Informed Consent: I have reviewed the patients History and Physical, chart, labs and discussed the procedure including the risks, benefits and alternatives for the proposed anesthesia with the patient or authorized representative who has indicated his/her understanding and acceptance.     Dental advisory given  Plan Discussed with: CRNA, Anesthesiologist and Surgeon  Anesthesia Plan Comments:         Anesthesia Quick Evaluation

## 2022-10-14 NOTE — Progress Notes (Signed)
Updated patient and patients husband of delay in her surgery today.  Will keep the patient informed.

## 2022-10-14 NOTE — Discharge Summary (Signed)
Patient ID: Michele Hall MRN: [id number] DOB/AGE: 56-May-1967 56 y.o.  Admit date: 10/13/2022 Discharge date: 10/14/2022  Primary Care Physician:  Excell Seltzer, MD  Discharge Diagnoses:   Present on Admission:  Ureterolithiasis   Consults:  None   Discharge Medications: Allergies as of 10/14/2022       Reactions   Fentanyl Other (See Comments)   "Felt like I was going to have a heart attack"    Requip [ropinirole Hcl] Other (See Comments)   Headache Feels "sick"        Medication List     STOP taking these medications    indapamide 2.5 MG tablet Commonly known as: LOZOL   oxyCODONE-acetaminophen 5-325 MG tablet Commonly known as: PERCOCET/ROXICET       TAKE these medications    aspirin EC 81 MG tablet Take 81 mg by mouth daily. Swallow whole.   cyanocobalamin 1000 MCG tablet Commonly known as: VITAMIN B12 Take 1,000 mcg by mouth 3 (three) times a week.   nitrofurantoin (macrocrystal-monohydrate) 100 MG capsule Commonly known as: Macrobid Take 1 capsule (100 mg total) by mouth daily for 14 days.   oxyCODONE 5 MG immediate release tablet Commonly known as: Oxy IR/ROXICODONE Take 1 tablet (5 mg total) by mouth every 6 (six) hours as needed for severe pain or moderate pain. What changed: when to take this   phenazopyridine 200 MG tablet Commonly known as: Pyridium Take 1 tablet (200 mg total) by mouth 3 (three) times daily as needed for pain.   spironolactone 50 MG tablet Commonly known as: ALDACTONE Take 50 mg by mouth daily.   tamsulosin 0.4 MG Caps capsule Commonly known as: FLOMAX Take 1 capsule (0.4 mg total) by mouth daily. What changed:  when to take this reasons to take this         Significant Diagnostic Studies:  DG C-Arm 1-60 Min-No Report  Result Date: 10/14/2022 Fluoroscopy was utilized by the requesting physician.  No radiographic interpretation.   CT Renal Stone Study  Result Date: 10/13/2022 CLINICAL DATA:  Abdominal and  flank pain. EXAM: CT ABDOMEN AND PELVIS WITHOUT CONTRAST TECHNIQUE: Multidetector CT imaging of the abdomen and pelvis was performed following the standard protocol without IV contrast. RADIATION DOSE REDUCTION: This exam was performed according to the departmental dose-optimization program which includes automated exposure control, adjustment of the mA and/or kV according to patient size and/or use of iterative reconstruction technique. COMPARISON:  CT abdomen and pelvis 01/22/2022 FINDINGS: Lower chest: No acute abnormality. Hepatobiliary: No focal liver abnormality is seen. Status post cholecystectomy. No biliary dilatation. Pancreas: Unremarkable. No pancreatic ductal dilatation or surrounding inflammatory changes. Spleen: Normal in size without focal abnormality. Adrenals/Urinary Tract: There are 2 calculi in the proximal left ureter measuring 4 mm more proximally and 7 mm distally. These are unchanged in size and position. Severe left hydronephrosis is again seen. There is increased moderate left perinephric fat stranding. Punctate bilateral renal calculi are seen. There is no right-sided hydronephrosis. The adrenal glands and bladder are within normal limits. Stomach/Bowel: Stomach is within normal limits. Appendix appears normal. No evidence of bowel wall thickening, distention, or inflammatory changes. Vascular/Lymphatic: Aortic atherosclerosis. No enlarged abdominal or pelvic lymph nodes. Reproductive: Uterus and bilateral adnexa are unremarkable. Other: Small fat containing umbilical hernia.  No ascites. Musculoskeletal: No acute or significant osseous findings. IMPRESSION: 1. 2 calculi in the proximal left ureter are unchanged in size and position. Severe left hydronephrosis is unchanged. There is increased left perinephric fat stranding.  Correlate clinically for superimposed infection. 2. Punctate nonobstructing bilateral renal calculi. Aortic Atherosclerosis (ICD10-I70.0). Electronically Signed   By:  Darliss Cheney M.D.   On: 10/13/2022 19:43    Brief H and P: For complete details please refer to admission H and P, but in brief 57 year old female presenting with left flank pain and left proximal ureteral calculi with obstruction on CT imaging.  She is admitted for pain control and surgical management of the left ureteral calculi.  Hospital Course:  The patient was admitted to the urology service on 10/14/2022.  She remained n.p.o. and was treated with IV fluids and pain medication.  She was taken to the operating room on 10/14/2022 where she underwent cystoscopy, left retrograde pyelogram, left ureteroscopic laser lithotripsy, stone manipulation, and insertion of left ureteral stent.  Details of the procedure may be found in the operative note located in the patient's chart.  The patient was felt to be stable for discharge home after the procedure.  She was afebrile and hemodynamically stable.  She was tolerating a diet and her pain was well-controlled.  Day of Discharge BP 110/68 (BP Location: Left Arm)   Pulse 87   Temp 98 F (36.7 C)   Resp 15   Ht 5\' 6"  (1.676 m)   Wt 117.9 kg   SpO2 92%   BMI 41.96 kg/m   Results for orders placed or performed during the hospital encounter of 10/13/22 (from the past 24 hour(s))  CBC with Differential     Status: Abnormal   Collection Time: 10/13/22  7:39 PM  Result Value Ref Range   WBC 9.2 4.0 - 10.5 K/uL   RBC 4.51 3.87 - 5.11 MIL/uL   Hemoglobin 14.7 12.0 - 15.0 g/dL   HCT 40.9 81.1 - 91.4 %   MCV 93.1 80.0 - 100.0 fL   MCH 32.6 26.0 - 34.0 pg   MCHC 35.0 30.0 - 36.0 g/dL   RDW 78.2 95.6 - 21.3 %   Platelets 220 150 - 400 K/uL   nRBC 0.0 0.0 - 0.2 %   Neutrophils Relative % 70 %   Neutro Abs 6.4 1.7 - 7.7 K/uL   Lymphocytes Relative 16 %   Lymphs Abs 1.5 0.7 - 4.0 K/uL   Monocytes Relative 12 %   Monocytes Absolute 1.1 (H) 0.1 - 1.0 K/uL   Eosinophils Relative 1 %   Eosinophils Absolute 0.1 0.0 - 0.5 K/uL   Basophils Relative 1 %    Basophils Absolute 0.1 0.0 - 0.1 K/uL   Immature Granulocytes 0 %   Abs Immature Granulocytes 0.03 0.00 - 0.07 K/uL  Comprehensive metabolic panel     Status: Abnormal   Collection Time: 10/13/22  7:39 PM  Result Value Ref Range   Sodium 135 135 - 145 mmol/L   Potassium 3.8 3.5 - 5.1 mmol/L   Chloride 103 98 - 111 mmol/L   CO2 23 22 - 32 mmol/L   Glucose, Bld 105 (H) 70 - 99 mg/dL   BUN 13 6 - 20 mg/dL   Creatinine, Ser 0.86 (H) 0.44 - 1.00 mg/dL   Calcium 8.9 8.9 - 57.8 mg/dL   Total Protein 7.7 6.5 - 8.1 g/dL   Albumin 4.1 3.5 - 5.0 g/dL   AST 26 15 - 41 U/L   ALT 30 0 - 44 U/L   Alkaline Phosphatase 53 38 - 126 U/L   Total Bilirubin 0.9 0.3 - 1.2 mg/dL   GFR, Estimated >46 >96 mL/min   Anion  gap 9 5 - 15    Physical Exam: General: Alert and awake oriented x3 not in any acute distress. HEENT: anicteric sclera, pupils reactive to light and accommodation CVS: S1-S2 clear no murmur rubs or gallops Chest: clear to auscultation bilaterally, no wheezing rales or rhonchi Abdomen: soft nontender, nondistended, normal bowel sounds, no organomegaly Extremities: no cyanosis, clubbing or edema noted bilaterally Neuro: Cranial nerves II-XII intact, no focal neurological deficits  Disposition: Home  Diet: Regular  Activity: As tolerated   TESTS THAT NEED FOLLOW-UP   none  DISCHARGE FOLLOW-UP   Follow-up Information     Evadne Ose, Danford Bad., MD Follow up in 2 week(s).   Specialty: Urology Why: For KUB and stent removal Contact information: [address] [place] [address] [phone]                 Time spent on Discharge:   15 min  Signed: Di Kindle 10/14/2022, 7:28 PM

## 2022-10-15 ENCOUNTER — Ambulatory Visit: Payer: BC Managed Care – PPO | Admitting: Family Medicine

## 2022-10-15 ENCOUNTER — Telehealth: Payer: Self-pay

## 2022-10-15 ENCOUNTER — Encounter: Payer: Self-pay | Admitting: Family Medicine

## 2022-10-15 ENCOUNTER — Other Ambulatory Visit: Payer: Self-pay | Admitting: Family Medicine

## 2022-10-15 VITALS — BP 112/68 | HR 93 | Temp 97.6°F | Ht 66.25 in | Wt 262.0 lb

## 2022-10-15 DIAGNOSIS — N201 Calculus of ureter: Secondary | ICD-10-CM

## 2022-10-15 DIAGNOSIS — I10 Essential (primary) hypertension: Secondary | ICD-10-CM

## 2022-10-15 MED ORDER — SPIRONOLACTONE 50 MG PO TABS: 50.0000 mg | ORAL_TABLET | Freq: Every day | ORAL | 0 refills | Status: DC

## 2022-10-15 NOTE — Assessment & Plan Note (Signed)
She states that changing from spironolactone to indapamide cause hot flashes.  This is likely due to stopping the spironolactone as opposed to new side effects from indapamide.  Encouraged her to discuss with urology whether switching to indapamide and giving it more time away from the spironolactone would be important given her recurrent nephrolithiasis.  Blood pressure is low normal in office today.

## 2022-10-15 NOTE — Progress Notes (Signed)
Patient ID: Michele Hall, female    DOB: 31-May-1966, 57 y.o.   MRN: [id number]  This visit was conducted in person.  BP 112/68   Pulse 93   Temp 97.6 F (36.4 C) (Temporal)   Ht 5' 6.25" (1.683 m)   Wt 262 lb (118.8 kg)   SpO2 96%   BMI 41.97 kg/m    CC:  Chief Complaint  Patient presents with   Hospitalization Follow-up    Subjective:   HPI: Michele Hall is a 57 y.o. female presenting on 10/15/2022 for Hospitalization Follow-up  Hospital admission 10/13/2022 for kidney stone Discharged 10/14/2022 Evaluated with cystoscopy with left ureteroscopy, stone basket treatment and left stent placement. Has scheduled follow-up with Dr. Pete Glatter, urology for stent removal 10/29/22  Today she reports She is using Pyridium and oxycodone 5 mg every 6 hours as needed for pain.  She is on Flomax 0.4 mg daily She is on Macrobid  antibiotic.   Had SE to the indapamide..  or had SE coming off the spironolactone.. had hot flashes.  Relevant past medical, surgical, family and social history reviewed and updated as indicated. Interim medical history since our last visit reviewed. Allergies and medications reviewed and updated. Outpatient Medications Prior to Visit  Medication Sig Dispense Refill   aspirin EC 81 MG tablet Take 81 mg by mouth daily. Swallow whole.     cyanocobalamin (VITAMIN B12) 1000 MCG tablet Take 1,000 mcg by mouth 3 (three) times a week.     nitrofurantoin, macrocrystal-monohydrate, (MACROBID) 100 MG capsule Take 1 capsule (100 mg total) by mouth daily for 14 days. 14 capsule 0   oxyCODONE (OXY IR/ROXICODONE) 5 MG immediate release tablet Take 1 tablet (5 mg total) by mouth every 6 (six) hours as needed for severe pain or moderate pain. 15 tablet 0   tamsulosin (FLOMAX) 0.4 MG CAPS capsule Take 1 capsule (0.4 mg total) by mouth daily. 14 capsule 1   spironolactone (ALDACTONE) 50 MG tablet Take 50 mg by mouth daily.     phenazopyridine (PYRIDIUM) 200 MG tablet Take 1 tablet  (200 mg total) by mouth 3 (three) times daily as needed for pain. (Patient not taking: Reported on 10/15/2022) 20 tablet 1   No facility-administered medications prior to visit.     Per HPI unless specifically indicated in ROS section below Review of Systems  Constitutional:  Negative for fatigue and fever.  HENT:  Negative for congestion.   Eyes:  Negative for pain.  Respiratory:  Negative for cough and shortness of breath.   Cardiovascular:  Negative for chest pain, palpitations and leg swelling.  Gastrointestinal:  Negative for abdominal pain.  Genitourinary:  Negative for dysuria and vaginal bleeding.  Musculoskeletal:  Negative for back pain.  Neurological:  Negative for syncope, light-headedness and headaches.  Psychiatric/Behavioral:  Negative for dysphoric mood.    Objective:  BP 112/68   Pulse 93   Temp 97.6 F (36.4 C) (Temporal)   Ht 5' 6.25" (1.683 m)   Wt 262 lb (118.8 kg)   SpO2 96%   BMI 41.97 kg/m   Wt Readings from Last 3 Encounters:  10/15/22 262 lb (118.8 kg)  10/14/22 260 lb (117.9 kg)  07/01/22 263 lb 8 oz (119.5 kg)      Physical Exam Constitutional:      General: She is not in acute distress.    Appearance: Normal appearance. She is well-developed. She is obese. She is not ill-appearing or toxic-appearing.  HENT:  Head: Normocephalic.     Right Ear: Hearing, tympanic membrane, ear canal and external ear normal. Tympanic membrane is not erythematous, retracted or bulging.     Left Ear: Hearing, tympanic membrane, ear canal and external ear normal. Tympanic membrane is not erythematous, retracted or bulging.     Nose: No mucosal edema or rhinorrhea.     Right Sinus: No maxillary sinus tenderness or frontal sinus tenderness.     Left Sinus: No maxillary sinus tenderness or frontal sinus tenderness.     Mouth/Throat:     Pharynx: Uvula midline.  Eyes:     General: Lids are normal. Lids are everted, no foreign bodies appreciated.      Conjunctiva/sclera: Conjunctivae normal.     Pupils: Pupils are equal, round, and reactive to light.  Neck:     Thyroid: No thyroid mass or thyromegaly.     Vascular: No carotid bruit.     Trachea: Trachea normal.  Cardiovascular:     Rate and Rhythm: Normal rate and regular rhythm.     Pulses: Normal pulses.     Heart sounds: Normal heart sounds, S1 normal and S2 normal. No murmur heard.    No friction rub. No gallop.  Pulmonary:     Effort: Pulmonary effort is normal. No tachypnea or respiratory distress.     Breath sounds: Normal breath sounds. No decreased breath sounds, wheezing, rhonchi or rales.  Abdominal:     General: Bowel sounds are normal.     Palpations: Abdomen is soft.     Tenderness: There is no abdominal tenderness.  Musculoskeletal:     Cervical back: Normal range of motion and neck supple.  Skin:    General: Skin is warm and dry.     Findings: No rash.  Neurological:     Mental Status: She is alert.  Psychiatric:        Mood and Affect: Mood is not anxious or depressed.        Speech: Speech normal.        Behavior: Behavior normal. Behavior is cooperative.        Thought Content: Thought content normal.        Judgment: Judgment normal.       Results for orders placed or performed during the hospital encounter of 10/13/22  CBC with Differential  Result Value Ref Range   WBC 9.2 4.0 - 10.5 K/uL   RBC 4.51 3.87 - 5.11 MIL/uL   Hemoglobin 14.7 12.0 - 15.0 g/dL   HCT 09.8 11.9 - 14.7 %   MCV 93.1 80.0 - 100.0 fL   MCH 32.6 26.0 - 34.0 pg   MCHC 35.0 30.0 - 36.0 g/dL   RDW 82.9 56.2 - 13.0 %   Platelets 220 150 - 400 K/uL   nRBC 0.0 0.0 - 0.2 %   Neutrophils Relative % 70 %   Neutro Abs 6.4 1.7 - 7.7 K/uL   Lymphocytes Relative 16 %   Lymphs Abs 1.5 0.7 - 4.0 K/uL   Monocytes Relative 12 %   Monocytes Absolute 1.1 (H) 0.1 - 1.0 K/uL   Eosinophils Relative 1 %   Eosinophils Absolute 0.1 0.0 - 0.5 K/uL   Basophils Relative 1 %   Basophils Absolute  0.1 0.0 - 0.1 K/uL   Immature Granulocytes 0 %   Abs Immature Granulocytes 0.03 0.00 - 0.07 K/uL  Urinalysis, Routine w reflex microscopic -Urine, Clean Catch  Result Value Ref Range   Color, Urine YELLOW YELLOW  APPearance CLEAR CLEAR   Specific Gravity, Urine 1.010 1.005 - 1.030   pH 6.0 5.0 - 8.0   Glucose, UA NEGATIVE NEGATIVE mg/dL   Hgb urine dipstick SMALL (A) NEGATIVE   Bilirubin Urine NEGATIVE NEGATIVE   Ketones, ur NEGATIVE NEGATIVE mg/dL   Protein, ur NEGATIVE NEGATIVE mg/dL   Nitrite NEGATIVE NEGATIVE   Leukocytes,Ua LARGE (A) NEGATIVE   RBC / HPF 6-10 0 - 5 RBC/hpf   WBC, UA 21-50 0 - 5 WBC/hpf   Bacteria, UA RARE (A) NONE SEEN   Squamous Epithelial / HPF 0-5 0 - 5 /HPF   Mucus PRESENT    Hyaline Casts, UA PRESENT   Comprehensive metabolic panel  Result Value Ref Range   Sodium 135 135 - 145 mmol/L   Potassium 3.8 3.5 - 5.1 mmol/L   Chloride 103 98 - 111 mmol/L   CO2 23 22 - 32 mmol/L   Glucose, Bld 105 (H) 70 - 99 mg/dL   BUN 13 6 - 20 mg/dL   Creatinine, Ser 1.61 (H) 0.44 - 1.00 mg/dL   Calcium 8.9 8.9 - 09.6 mg/dL   Total Protein 7.7 6.5 - 8.1 g/dL   Albumin 4.1 3.5 - 5.0 g/dL   AST 26 15 - 41 U/L   ALT 30 0 - 44 U/L   Alkaline Phosphatase 53 38 - 126 U/L   Total Bilirubin 0.9 0.3 - 1.2 mg/dL   GFR, Estimated >04 >54 mL/min   Anion gap 9 5 - 15    Assessment and Plan  Ureterolithiasis Assessment & Plan: Pain controlled without requiring narcotic currently.  She is using Flomax.  Pyridium was too expensive so coupon for GoodRx was found by my medical assistant and provided to the patient.  She will complete antibiotics (nitrofurantoin 100 mg p.o. daily ( She will push fluids. She has upcoming follow-up with urology for stent removal.   Essential hypertension, benign Assessment & Plan: She states that changing from spironolactone to indapamide cause hot flashes.  This is likely due to stopping the spironolactone as opposed to new side effects from  indapamide.  Encouraged her to discuss with urology whether switching to indapamide and giving it more time away from the spironolactone would be important given her recurrent nephrolithiasis.  Blood pressure is low normal in office today.   Other orders -     Spironolactone; Take 1 tablet (50 mg total) by mouth daily.  Dispense: 30 tablet; Refill: 0    No follow-ups on file.   Kerby Nora, MD

## 2022-10-15 NOTE — Transitions of Care (Post Inpatient/ED Visit) (Signed)
   10/15/2022  Name: Michele Hall MRN: [id number] DOB: 02-05-1966  Today's TOC FU Call Status:    Attempted to reach the patient regarding the most recent Inpatient/ED visit.  Follow Up Plan: Additional outreach attempts will be made to reach the patient to complete the Transitions of Care (Post Inpatient/ED visit) call.   Signature   Demarius Archila, CMA  [facility] Team

## 2022-10-15 NOTE — Assessment & Plan Note (Signed)
Pain controlled without requiring narcotic currently.  She is using Flomax.  Pyridium was too expensive so coupon for GoodRx was found by my medical assistant and provided to the patient.  She will complete antibiotics (nitrofurantoin 100 mg p.o. daily ( She will push fluids. She has upcoming follow-up with urology for stent removal.

## 2022-10-16 ENCOUNTER — Encounter (HOSPITAL_COMMUNITY): Payer: Self-pay | Admitting: Urology

## 2022-10-16 NOTE — Anesthesia Postprocedure Evaluation (Signed)
Anesthesia Post Note  Patient: MILIKA SHERIDAN  Procedure(s) Performed: CYSTOSCOPY WITH LEFT URETEROSCOPY, STONE BASKETRY AND LEFTSTENT PLACEMENT,RETROGRADE (Left) HOLMIUM LASER APPLICATION     Patient location during evaluation: PACU Anesthesia Type: General Level of consciousness: awake and alert Pain management: pain level controlled Vital Signs Assessment: post-procedure vital signs reviewed and stable Respiratory status: spontaneous breathing, nonlabored ventilation, respiratory function stable and patient connected to nasal cannula oxygen Cardiovascular status: stable and blood pressure returned to baseline Anesthetic complications: no   No notable events documented.  Last Vitals:  Vitals:   10/14/22 1800 10/14/22 1807  BP: 112/62 110/68  Pulse: 88 87  Resp: 15   Temp: 36.7 C 36.7 C  SpO2: 92% 92%    Last Pain:  Vitals:   10/14/22 1807  TempSrc:   PainSc: 0-No pain   Pain Goal: Patients Stated Pain Goal: 3 (10/14/22 1211)                 Beryle Lathe

## 2022-10-29 ENCOUNTER — Ambulatory Visit (HOSPITAL_BASED_OUTPATIENT_CLINIC_OR_DEPARTMENT_OTHER)
Admission: RE | Admit: 2022-10-29 | Discharge: 2022-10-29 | Disposition: A | Discharge status: Home / Self Care | Payer: BC Managed Care – PPO | Source: Ambulatory Visit | Attending: Urology | Admitting: Urology

## 2022-10-29 ENCOUNTER — Encounter: Payer: Self-pay | Admitting: Urology

## 2022-10-29 ENCOUNTER — Ambulatory Visit (INDEPENDENT_AMBULATORY_CARE_PROVIDER_SITE_OTHER): Payer: BC Managed Care – PPO | Admitting: Urology

## 2022-10-29 VITALS — BP 124/89 | HR 123 | Ht 67.0 in | Wt 260.0 lb

## 2022-10-29 DIAGNOSIS — N201 Calculus of ureter: Secondary | ICD-10-CM | POA: Diagnosis present

## 2022-10-29 DIAGNOSIS — Z466 Encounter for fitting and adjustment of urinary device: Secondary | ICD-10-CM | POA: Diagnosis not present

## 2022-10-29 DIAGNOSIS — Z09 Encounter for follow-up examination after completed treatment for conditions other than malignant neoplasm: Secondary | ICD-10-CM

## 2022-10-29 DIAGNOSIS — Z87442 Personal history of urinary calculi: Secondary | ICD-10-CM | POA: Diagnosis not present

## 2022-10-29 LAB — URINALYSIS, ROUTINE W REFLEX MICROSCOPIC
Bilirubin, UA: NEGATIVE
Glucose, UA: NEGATIVE
Ketones, UA: NEGATIVE
Nitrite, UA: NEGATIVE
Specific Gravity, UA: 1.025 (ref 1.005–1.030)
Urobilinogen, Ur: 0.2 mg/dL (ref 0.2–1.0)
pH, UA: 7 (ref 5.0–7.5)

## 2022-10-29 LAB — MICROSCOPIC EXAMINATION
Cast Type: NONE SEEN
Casts: NONE SEEN /lpf
Crystal Type: NONE SEEN
Crystals: NONE SEEN
RBC, Urine: >30 /hpf — AB (ref 0–2)
Trichomonas, UA: NONE SEEN
Yeast, UA: NONE SEEN

## 2022-10-29 MED ORDER — CIPROFLOXACIN HCL 500 MG PO TABS
500.0000 mg | ORAL_TABLET | Freq: Once | ORAL | Status: AC
Start: 2022-10-29 — End: 2022-10-29
  Administered 2022-10-29: 500 mg via ORAL

## 2022-10-29 NOTE — Progress Notes (Signed)
Assessment: 1. Ureterolithiasis     Plan: KUB from today showed the left ureteral stent in good position.  There appeared to be 2 calcifications at the proximal aspect of the stent measuring 2-3 mm in width consistent with remaining stone fragments.  I felt these would be small enough to pass spontaneously after removal of the stent. Left ureteral removed today. Cipro 500 mg x 1 following stent removal Strain urine Return to office in 10-14 days with KUB.  Chief Complaint: Chief Complaint  Patient presents with   Cysto Stent Removal    HPI: Michele Hall is a 57 y.o. female who presents for continued evaluation of left ureteral calculus.  She presented to the emergency room on 10/13/2022 with left flank pain.  CT demonstrated 2 proximal left ureteral calculi with associated obstruction.  Her creatinine was slightly elevated 1.08.  She was admitted to the hospital for pain control.  She subsequently underwent cystoscopy, left retrograde pyelogram, left ureteroscopic laser lithotripsy with stone manipulation and insertion of a left ureteral stent.  She returns today for stent removal.  She is doing fairly well following the procedure.  She has had intermittent gross hematuria.  No flank pain.  No fevers or chills.  Portions of the above documentation were copied from a prior visit for review purposes only.  Allergies: Allergies  Allergen Reactions   Fentanyl Other (See Comments)    "Felt like I was going to have a heart attack"    Requip [Ropinirole Hcl] Other (See Comments)    Headache Feels "sick"    PMH: Past Medical History:  Diagnosis Date   Anal fissure - posterior 2015   Complication of anesthesia    Pt states she heard her "oxygen dropped during procedure"   Gallstones    GERD (gastroesophageal reflux disease)    History of kidney stones    Hypertension    Morbid obesity (HCC)     PSH: Past Surgical History:  Procedure Laterality Date   CHOLECYSTECTOMY  1988    CYSTOSCOPY WITH RETROGRADE PYELOGRAM, URETEROSCOPY AND STENT PLACEMENT Left 04/18/2020   Procedure: CYSTOSCOPY WITH RETROGRADE PYELOGRAM, URETEROSCOPY , STONE EXTRACTION AND STENT PLACEMENT;  Surgeon: Marcine Matar, MD;  Location: [facility];  Service: Urology;  Laterality: Left;  75 MINS   CYSTOSCOPY WITH RETROGRADE PYELOGRAM, URETEROSCOPY AND STENT PLACEMENT Bilateral 01/29/2022   Procedure: CYSTOSCOPY WITH RETROGRADE PYELOGRAM, URETEROSCOPY, STONE EXTRACTION  AND STENT PLACEMENT holmium laser;  Surgeon: Marcine Matar, MD;  Location: [facility];  Service: Urology;  Laterality: Bilateral;   CYSTOSCOPY WITH STENT PLACEMENT Left 01/22/2022   Procedure: CYSTOSCOPY WITH STENT PLACEMENT;  Surgeon: Belva Agee, MD;  Location: [facility];  Service: Urology;  Laterality: Left;   CYSTOSCOPY WITH URETEROSCOPY, STONE BASKETRY AND STENT PLACEMENT Left 10/14/2022   Procedure: CYSTOSCOPY WITH LEFT URETEROSCOPY, STONE BASKETRY AND LEFTSTENT PLACEMENT,RETROGRADE;  Surgeon: Milderd Meager., MD;  Location: [facility];  Service: Urology;  Laterality: Left;   CYSTOSCOPY/URETEROSCOPY/HOLMIUM LASER/STENT PLACEMENT Left 07/24/2021   Procedure: CYSTOSCOPY/URETEROSCOPY/HOLMIUM LASER/STENT PLACEMENT;  Surgeon: Marcine Matar, MD;  Location: [facility];  Service: Urology;  Laterality: Left;   DILITATION & CURRETTAGE/HYSTROSCOPY WITH HYDROTHERMAL ABLATION N/A 09/13/2012   Procedure: DILATATION & CURETTAGE/HYSTEROSCOPY WITH HYDROTHERMAL ABLATION;  Surgeon: Tereso Newcomer, MD;  Location: [facility];  Service: Gynecology;  Laterality: N/A;   HOLMIUM LASER APPLICATION Left 04/18/2020   Procedure: HOLMIUM LASER LITHOTRIPSY;  Surgeon: Marcine Matar, MD;  Location: [facility];  Service: Urology;  Laterality: Left;   HOLMIUM LASER  APPLICATION Right 07/24/2021   Procedure: HOLMIUM LASER APPLICATION;  Surgeon: Marcine Matar, MD;  Location: [facility];  Service: Urology;  Laterality: Right;   HOLMIUM LASER APPLICATION Bilateral  01/29/2022   Procedure: HOLMIUM LASER APPLICATION;  Surgeon: Marcine Matar, MD;  Location: [facility];  Service: Urology;  Laterality: Bilateral;   HOLMIUM LASER APPLICATION  10/14/2022   Procedure: HOLMIUM LASER APPLICATION;  Surgeon: Milderd Meager., MD;  Location: [facility];  Service: Urology;;   IR URETERAL STENT RIGHT NEW ACCESS W/O SEP NEPHROSTOMY CATH  07/24/2021   IR US GUIDANCE  07/24/2021   NEPHROLITHOTOMY Right 07/24/2021   Procedure: NEPHROLITHOTOMY PERCUTANEOUS;  Surgeon: Marcine Matar, MD;  Location: [facility];  Service: Urology;  Laterality: Right;   TUBAL LIGATION  1990    SH: Social History   Tobacco Use   Smoking status: Former    Packs/day: 0.25    Years: 5.00    Additional pack years: 0.00    Total pack years: 1.25    Types: Cigarettes    Quit date: 02/09/2002    Years since quitting: 20.7   Smokeless tobacco: Never  Vaping Use   Vaping Use: Never used  Substance Use Topics   Alcohol use: No   Drug use: No    ROS: Constitutional:  Negative for fever, chills, weight loss CV: Negative for chest pain, previous MI, hypertension Respiratory:  Negative for shortness of breath, wheezing, sleep apnea, frequent cough GI:  Negative for nausea, vomiting, bloody stool, GERD  PE: BP 124/89   Pulse (!) 123   Ht 5\' 7"  (1.702 m)   Wt 260 lb (117.9 kg)   BMI 40.72 kg/m  GENERAL APPEARANCE:  Well appearing, well developed, well nourished, NAD HEENT:  Atraumatic, normocephalic, oropharynx clear NECK:  Supple without lymphadenopathy or thyromegaly ABDOMEN:  Soft, non-tender, no masses EXTREMITIES:  Moves all extremities well, without clubbing, cyanosis, or edema NEUROLOGIC:  Alert and oriented x 3, normal gait, CN II-XII grossly intact MENTAL STATUS:  appropriate BACK:  Non-tender to palpation, No CVAT SKIN:  Warm, dry, and intact   Results: U/A:  0-5 WBC, >30 RBC, mod bacteria   Procedure: Flexible cystoscopy/stent removal  Pre-Operative Diagnosis:   left  ureteral calculus  Post-Operative Diagnosis:  left ureteral calculus  Anesthesia: local with lidocaine gel  Surgical Narrative:  After appropriate informed consent was obtained, the patient was prepped and draped in the usual sterile fashion in the supine position. She was correctly identified and the proper procedure delineated prior to proceeding. Sterile lidocaine gel was instilled in the urethra.  The flexible cystoscope was introduced without difficulty. The left ureteral stent was identified and removed using stent graspers. She tolerated the procedure well.  A chaperone was present throughout the procedure.

## 2022-10-30 ENCOUNTER — Telehealth: Payer: Self-pay

## 2022-10-30 NOTE — Telephone Encounter (Signed)
Pt had stent removed yesterday. She reports that she is still hurting pretty bad today in the kidney area. She is trying to flush with water. Pt reports no fever, nausea, vomit and is able to urinate.

## 2022-11-06 ENCOUNTER — Other Ambulatory Visit: Payer: Self-pay

## 2022-11-06 DIAGNOSIS — N201 Calculus of ureter: Secondary | ICD-10-CM

## 2022-11-13 ENCOUNTER — Encounter: Payer: Self-pay | Admitting: Urology

## 2022-11-13 ENCOUNTER — Telehealth (HOSPITAL_BASED_OUTPATIENT_CLINIC_OR_DEPARTMENT_OTHER): Payer: Self-pay

## 2022-11-13 ENCOUNTER — Ambulatory Visit (HOSPITAL_BASED_OUTPATIENT_CLINIC_OR_DEPARTMENT_OTHER)
Admission: RE | Admit: 2022-11-13 | Discharge: 2022-11-13 | Disposition: A | Discharge status: Home / Self Care | Payer: BC Managed Care – PPO | Source: Ambulatory Visit | Attending: Urology | Admitting: Urology

## 2022-11-13 ENCOUNTER — Ambulatory Visit (INDEPENDENT_AMBULATORY_CARE_PROVIDER_SITE_OTHER): Payer: BC Managed Care – PPO | Admitting: Urology

## 2022-11-13 VITALS — BP 138/87 | HR 109 | Ht 66.0 in | Wt 260.0 lb

## 2022-11-13 DIAGNOSIS — R8290 Unspecified abnormal findings in urine: Secondary | ICD-10-CM

## 2022-11-13 DIAGNOSIS — N201 Calculus of ureter: Secondary | ICD-10-CM

## 2022-11-13 NOTE — Progress Notes (Signed)
Assessment: 1. Ureterolithiasis   2. Abnormal urine findings    Plan: KUB from today reviewed.  The previously noted calcifications in the area of the left proximal ureter are not visualized.  I do not see any other calcifications along the expected course of the left ureter.  Small calcifications noted in the lower left renal shadow. Urine culture sent today Return to office in 1 month with renal U/S Will arrange for 24-hour urine after next visit.  Chief Complaint: Chief Complaint  Patient presents with   Nephrolithiasis    HPI: Michele Hall is a 57 y.o. female who presents for continued evaluation of left ureteral calculus.  She presented to the emergency room on 10/13/2022 with left flank pain.  CT demonstrated 2 proximal left ureteral calculi with associated obstruction.  Her creatinine was slightly elevated 1.08.  She was admitted to the hospital for pain control.  She subsequently underwent cystoscopy, left retrograde pyelogram, left ureteroscopic laser lithotripsy with stone manipulation and insertion of a left ureteral stent. KUB from 10/29/2022 showed the left ureteral stent in good position with 2 calcifications along the proximal aspect of the stent measuring 2-3 mm in width. Her stent was removed on 10/29/2022.  She returns today for follow-up.  She is not aware of passing any stone fragments but has not been straining her urine.  She has not had any significant flank pain.  She noted a slight discomfort in the left flank area yesterday.  No dysuria or gross hematuria.  Portions of the above documentation were copied from a prior visit for review purposes only.  Allergies: Allergies  Allergen Reactions   Fentanyl Other (See Comments)    "Felt like I was going to have a heart attack"    Requip [Ropinirole Hcl] Other (See Comments)    Headache Feels "sick"    PMH: Past Medical History:  Diagnosis Date   Anal fissure - posterior 2015   Complication of anesthesia     Pt states she heard her "oxygen dropped during procedure"   Gallstones    GERD (gastroesophageal reflux disease)    History of kidney stones    Hypertension    Morbid obesity (HCC)     PSH: Past Surgical History:  Procedure Laterality Date   CHOLECYSTECTOMY  1988   CYSTOSCOPY WITH RETROGRADE PYELOGRAM, URETEROSCOPY AND STENT PLACEMENT Left 04/18/2020   Procedure: CYSTOSCOPY WITH RETROGRADE PYELOGRAM, URETEROSCOPY , STONE EXTRACTION AND STENT PLACEMENT;  Surgeon: Marcine Matar, MD;  Location: [facility];  Service: Urology;  Laterality: Left;  75 MINS   CYSTOSCOPY WITH RETROGRADE PYELOGRAM, URETEROSCOPY AND STENT PLACEMENT Bilateral 01/29/2022   Procedure: CYSTOSCOPY WITH RETROGRADE PYELOGRAM, URETEROSCOPY, STONE EXTRACTION  AND STENT PLACEMENT holmium laser;  Surgeon: Marcine Matar, MD;  Location: [facility];  Service: Urology;  Laterality: Bilateral;   CYSTOSCOPY WITH STENT PLACEMENT Left 01/22/2022   Procedure: CYSTOSCOPY WITH STENT PLACEMENT;  Surgeon: Belva Agee, MD;  Location: [facility];  Service: Urology;  Laterality: Left;   CYSTOSCOPY WITH URETEROSCOPY, STONE BASKETRY AND STENT PLACEMENT Left 10/14/2022   Procedure: CYSTOSCOPY WITH LEFT URETEROSCOPY, STONE BASKETRY AND LEFTSTENT PLACEMENT,RETROGRADE;  Surgeon: Milderd Meager., MD;  Location: [facility];  Service: Urology;  Laterality: Left;   CYSTOSCOPY/URETEROSCOPY/HOLMIUM LASER/STENT PLACEMENT Left 07/24/2021   Procedure: CYSTOSCOPY/URETEROSCOPY/HOLMIUM LASER/STENT PLACEMENT;  Surgeon: Marcine Matar, MD;  Location: [facility];  Service: Urology;  Laterality: Left;   DILITATION & CURRETTAGE/HYSTROSCOPY WITH HYDROTHERMAL ABLATION N/A 09/13/2012   Procedure: DILATATION & CURETTAGE/HYSTEROSCOPY WITH HYDROTHERMAL ABLATION;  Surgeon: Tereso Newcomer, MD;  Location: [facility];  Service: Gynecology;  Laterality: N/A;   HOLMIUM LASER APPLICATION Left 04/18/2020   Procedure: HOLMIUM LASER LITHOTRIPSY;  Surgeon: Marcine Matar, MD;   Location: [facility];  Service: Urology;  Laterality: Left;   HOLMIUM LASER APPLICATION Right 07/24/2021   Procedure: HOLMIUM LASER APPLICATION;  Surgeon: Marcine Matar, MD;  Location: [facility];  Service: Urology;  Laterality: Right;   HOLMIUM LASER APPLICATION Bilateral 01/29/2022   Procedure: HOLMIUM LASER APPLICATION;  Surgeon: Marcine Matar, MD;  Location: [facility];  Service: Urology;  Laterality: Bilateral;   HOLMIUM LASER APPLICATION  10/14/2022   Procedure: HOLMIUM LASER APPLICATION;  Surgeon: Milderd Meager., MD;  Location: [facility];  Service: Urology;;   IR URETERAL STENT RIGHT NEW ACCESS W/O SEP NEPHROSTOMY CATH  07/24/2021   IR US GUIDANCE  07/24/2021   NEPHROLITHOTOMY Right 07/24/2021   Procedure: NEPHROLITHOTOMY PERCUTANEOUS;  Surgeon: Marcine Matar, MD;  Location: [facility];  Service: Urology;  Laterality: Right;   TUBAL LIGATION  1990    SH: Social History   Tobacco Use   Smoking status: Former    Packs/day: 0.25    Years: 5.00    Additional pack years: 0.00    Total pack years: 1.25    Types: Cigarettes    Quit date: 02/09/2002    Years since quitting: 20.7   Smokeless tobacco: Never  Vaping Use   Vaping Use: Never used  Substance Use Topics   Alcohol use: No   Drug use: No    ROS: Constitutional:  Negative for fever, chills, weight loss CV: Negative for chest pain, previous MI, hypertension Respiratory:  Negative for shortness of breath, wheezing, sleep apnea, frequent cough GI:  Negative for nausea, vomiting, bloody stool, GERD  PE: BP 138/87   Pulse (!) 109   Ht 5\' 6"  (1.676 m)   Wt 260 lb (117.9 kg)   BMI 41.97 kg/m  GENERAL APPEARANCE:  Well appearing, well developed, well nourished, NAD HEENT:  Atraumatic, normocephalic, oropharynx clear NECK:  Supple without lymphadenopathy or thyromegaly ABDOMEN:  Soft, non-tender, no masses EXTREMITIES:  Moves all extremities well, without clubbing, cyanosis, or edema NEUROLOGIC:  Alert and oriented x 3,  normal gait, CN II-XII grossly intact MENTAL STATUS:  appropriate BACK:  Non-tender to palpation, No CVAT SKIN:  Warm, dry, and intact   Results: U/A:  >30 WBC, 3-10 RBC, >10 epithelial cells, many bacteria

## 2022-11-16 LAB — URINALYSIS, ROUTINE W REFLEX MICROSCOPIC
Bilirubin, UA: NEGATIVE
Glucose, UA: NEGATIVE
Ketones, UA: NEGATIVE
Nitrite, UA: NEGATIVE
Protein,UA: NEGATIVE
Specific Gravity, UA: 1.015 (ref 1.005–1.030)
Urobilinogen, Ur: 0.2 mg/dL (ref 0.2–1.0)
pH, UA: 5.5 (ref 5.0–7.5)

## 2022-11-16 LAB — MICROSCOPIC EXAMINATION
Cast Type: NONE SEEN
Casts: NONE SEEN /lpf
Crystal Type: NONE SEEN
Crystals: NONE SEEN
Epithelial Cells (non renal): >10 /hpf — AB (ref 0–10)
Trichomonas, UA: NONE SEEN
WBC, UA: >30 /hpf — AB (ref 0–5)
Yeast, UA: NONE SEEN

## 2022-11-16 LAB — URINE CULTURE

## 2022-12-04 ENCOUNTER — Other Ambulatory Visit: Payer: Self-pay

## 2022-12-04 ENCOUNTER — Ambulatory Visit (HOSPITAL_BASED_OUTPATIENT_CLINIC_OR_DEPARTMENT_OTHER)
Admission: RE | Admit: 2022-12-04 | Discharge: 2022-12-04 | Disposition: A | Discharge status: Home / Self Care | Payer: BC Managed Care – PPO | Source: Ambulatory Visit | Attending: Urology | Admitting: Urology

## 2022-12-04 DIAGNOSIS — N201 Calculus of ureter: Secondary | ICD-10-CM

## 2022-12-04 LAB — URINALYSIS, ROUTINE W REFLEX MICROSCOPIC
Bilirubin, UA: NEGATIVE
Glucose, UA: NEGATIVE
Ketones, UA: NEGATIVE
Nitrite, UA: NEGATIVE
Protein,UA: NEGATIVE
Specific Gravity, UA: 1.025 (ref 1.005–1.030)
Urobilinogen, Ur: 0.2 mg/dL (ref 0.2–1.0)
pH, UA: 7 (ref 5.0–7.5)

## 2022-12-04 LAB — MICROSCOPIC EXAMINATION
Cast Type: NONE SEEN
Casts: NONE SEEN /lpf
Trichomonas, UA: NONE SEEN
Yeast, UA: NONE SEEN

## 2022-12-07 ENCOUNTER — Telehealth: Payer: Self-pay

## 2022-12-07 ENCOUNTER — Telehealth: Payer: Self-pay | Admitting: Urology

## 2022-12-07 ENCOUNTER — Encounter: Payer: Self-pay | Admitting: Urology

## 2022-12-07 LAB — URINE CULTURE

## 2022-12-07 NOTE — Telephone Encounter (Signed)
Incoming call reporting impression for Renal US performed on Friday 12/04/22. Please see impression.

## 2022-12-07 NOTE — Telephone Encounter (Signed)
Ray from [facility]  Results of Renal US. [phone]

## 2022-12-08 NOTE — Addendum Note (Signed)
Addended by: Milderd Meager on: 12/08/2022 10:00 AM   Modules accepted: Orders

## 2022-12-09 ENCOUNTER — Ambulatory Visit: Payer: BC Managed Care – PPO | Admitting: Urology

## 2022-12-09 NOTE — Progress Notes (Deleted)
Assessment: 1. Ureterolithiasis   2. Nephrolithiasis     Plan: Will arrange for 24-hour urine after next visit.  Chief Complaint: No chief complaint on file.   HPI: Michele Hall is a 57 y.o. female who presents for continued evaluation of left ureteral calculus.   She presented to the emergency room on 10/13/2022 with left flank pain.  CT demonstrated 2 proximal left ureteral calculi with associated obstruction.  Her creatinine was slightly elevated 1.08.  She was admitted to the hospital for pain control.  She subsequently underwent cystoscopy, left retrograde pyelogram, left ureteroscopic laser lithotripsy with stone manipulation and insertion of a left ureteral stent. KUB from 10/29/2022 showed the left ureteral stent in good position with 2 calcifications along the proximal aspect of the stent measuring 2-3 mm in width. Her stent was removed on 10/29/2022. She was not aware of passing any stone fragments but had not been straining her urine.  She had not had any significant flank pain at the time of her visit on 11/13/22.   KUB from 11/13/2022 showed a possible left upper ureteral calcification and bilateral renal calculi, left greater than right. Renal ultrasound from 12/04/2022 showed moderate to severe left hydronephrosis with a 1 cm stone in the proximal left ureter and a 6 mm nonobstructing calculus in the inferior right kidney.  Portions of the above documentation were copied from a prior visit for review purposes only.  Allergies: Allergies  Allergen Reactions   Fentanyl Other (See Comments)    "Felt like I was going to have a heart attack"    Requip [Ropinirole Hcl] Other (See Comments)    Headache Feels "sick"    PMH: Past Medical History:  Diagnosis Date   Anal fissure - posterior 2015   Complication of anesthesia    Pt states she heard her "oxygen dropped during procedure"   Gallstones    GERD (gastroesophageal reflux disease)    History of kidney stones     Hypertension    Morbid obesity (HCC)     PSH: Past Surgical History:  Procedure Laterality Date   CHOLECYSTECTOMY  1988   CYSTOSCOPY WITH RETROGRADE PYELOGRAM, URETEROSCOPY AND STENT PLACEMENT Left 04/18/2020   Procedure: CYSTOSCOPY WITH RETROGRADE PYELOGRAM, URETEROSCOPY , STONE EXTRACTION AND STENT PLACEMENT;  Surgeon: Marcine Matar, MD;  Location: [facility];  Service: Urology;  Laterality: Left;  75 MINS   CYSTOSCOPY WITH RETROGRADE PYELOGRAM, URETEROSCOPY AND STENT PLACEMENT Bilateral 01/29/2022   Procedure: CYSTOSCOPY WITH RETROGRADE PYELOGRAM, URETEROSCOPY, STONE EXTRACTION  AND STENT PLACEMENT holmium laser;  Surgeon: Marcine Matar, MD;  [facility]: [facility];  Service: Urology;  Laterality: Bilateral;   CYSTOSCOPY WITH STENT PLACEMENT Left 01/22/2022   Procedure: CYSTOSCOPY WITH STENT PLACEMENT;  Surgeon: Belva Agee, MD;  Location: [facility];  Service: Urology;  Laterality: Left;   CYSTOSCOPY WITH URETEROSCOPY, STONE BASKETRY AND STENT PLACEMENT Left 10/14/2022   Procedure: CYSTOSCOPY WITH LEFT URETEROSCOPY, STONE BASKETRY AND LEFTSTENT PLACEMENT,RETROGRADE;  Surgeon: Milderd Meager., MD;  [facility]: [facility];  Service: Urology;  Laterality: Left;   CYSTOSCOPY/URETEROSCOPY/HOLMIUM LASER/STENT PLACEMENT Left 07/24/2021   Procedure: CYSTOSCOPY/URETEROSCOPY/HOLMIUM LASER/STENT PLACEMENT;  Surgeon: Marcine Matar, MD;  [facility]: [facility];  Service: Urology;  Laterality: Left;   DILITATION & CURRETTAGE/HYSTROSCOPY WITH HYDROTHERMAL ABLATION N/A 09/13/2012   Procedure: DILATATION & CURETTAGE/HYSTEROSCOPY WITH HYDROTHERMAL ABLATION;  Surgeon: Tereso Newcomer, MD;  Location: [facility];  Service: Gynecology;  Laterality: N/A;   HOLMIUM LASER APPLICATION Left 04/18/2020   Procedure: HOLMIUM LASER LITHOTRIPSY;  Surgeon:  Marcine Matar, MD;  [facility];  Service: Urology;  Laterality: Left;   HOLMIUM LASER APPLICATION Right 07/24/2021   Procedure: HOLMIUM LASER APPLICATION;  Surgeon:  Marcine Matar, MD;  [facility];  Service: Urology;  Laterality: Right;   HOLMIUM LASER APPLICATION Bilateral 01/29/2022   Procedure: HOLMIUM LASER APPLICATION;  Surgeon: Marcine Matar, MD;  [facility];  Service: Urology;  Laterality: Bilateral;   HOLMIUM LASER APPLICATION  10/14/2022   Procedure: HOLMIUM LASER APPLICATION;  Surgeon: Milderd Meager., MD;  [facility];  Service: Urology;;   IR URETERAL STENT RIGHT NEW ACCESS W/O SEP NEPHROSTOMY CATH  07/24/2021   IR US GUIDANCE  07/24/2021   NEPHROLITHOTOMY Right 07/24/2021   Procedure: NEPHROLITHOTOMY PERCUTANEOUS;  Surgeon: Marcine Matar, MD;  [facility];  Service: Urology;  Laterality: Right;   TUBAL LIGATION  1990    SH: Social History   Tobacco Use   Smoking status: Former    Packs/day: 0.25    Years: 5.00    Additional pack years: 0.00    Total pack years: 1.25    Types: Cigarettes    Quit date: 02/09/2002    Years since quitting: 20.8   Smokeless tobacco: Never  Vaping Use   Vaping Use: Never used  Substance Use Topics   Alcohol use: No   Drug use: No    ROS: Constitutional:  Negative for fever, chills, weight loss CV: Negative for chest pain, previous MI, hypertension Respiratory:  Negative for shortness of breath, wheezing, sleep apnea, frequent cough GI:  Negative for nausea, vomiting, bloody stool, GERD  PE: There were no vitals taken for this visit. GENERAL APPEARANCE:  Well appearing, well developed, well nourished, NAD HEENT:  Atraumatic, normocephalic, oropharynx clear NECK:  Supple without lymphadenopathy or thyromegaly ABDOMEN:  Soft, non-tender, no masses EXTREMITIES:  Moves all extremities well, without clubbing, cyanosis, or edema NEUROLOGIC:  Alert and oriented x 3, normal gait, CN II-XII grossly intact MENTAL STATUS:  appropriate BACK:  Non-tender to palpation, No CVAT SKIN:  Warm, dry, and intact   Results: U/A:

## 2022-12-14 ENCOUNTER — Ambulatory Visit: Payer: BC Managed Care – PPO | Admitting: Urology

## 2022-12-14 NOTE — Progress Notes (Deleted)
Assessment: 1. Ureterolithiasis      Plan: Will arrange for 24-hour urine after next visit.  Chief Complaint: No chief complaint on file.   HPI: Michele Hall is a 57 y.o. female who presents for continued evaluation of left ureteral calculus.   She presented to the emergency room on 10/13/2022 with left flank pain.  CT demonstrated 2 proximal left ureteral calculi with associated obstruction.  Her creatinine was slightly elevated 1.08.  She was admitted to the hospital for pain control.  She subsequently underwent cystoscopy, left retrograde pyelogram, left ureteroscopic laser lithotripsy with stone manipulation and insertion of a left ureteral stent. KUB from 10/29/2022 showed the left ureteral stent in good position with 2 calcifications along the proximal aspect of the stent measuring 2-3 mm in width. Her stent was removed on 10/29/2022. She was not aware of passing any stone fragments but had not been straining her urine.  She had not had any significant flank pain at the time of her visit on 11/13/22.   KUB from 11/13/2022 showed a possible left upper ureteral calcification and bilateral renal calculi, left greater than right. Renal ultrasound from 12/04/2022 showed moderate to severe left hydronephrosis with a 1 cm stone in the proximal left ureter and a 6 mm nonobstructing calculus in the inferior right kidney.  Portions of the above documentation were copied from a prior visit for review purposes only.  Allergies: Allergies  Allergen Reactions   Fentanyl Other (See Comments)    "Felt like I was going to have a heart attack"    Requip [Ropinirole Hcl] Other (See Comments)    Headache Feels "sick"    PMH: Past Medical History:  Diagnosis Date   Anal fissure - posterior 2015   Complication of anesthesia    Pt states she heard her "oxygen dropped during procedure"   Gallstones    GERD (gastroesophageal reflux disease)    History of kidney stones    Hypertension    Morbid  obesity (HCC)     PSH: Past Surgical History:  Procedure Laterality Date   CHOLECYSTECTOMY  1988   CYSTOSCOPY WITH RETROGRADE PYELOGRAM, URETEROSCOPY AND STENT PLACEMENT Left 04/18/2020   Procedure: CYSTOSCOPY WITH RETROGRADE PYELOGRAM, URETEROSCOPY , STONE EXTRACTION AND STENT PLACEMENT;  Surgeon: Marcine Matar, MD;  [facility];  Service: Urology;  Laterality: Left;  75 MINS   CYSTOSCOPY WITH RETROGRADE PYELOGRAM, URETEROSCOPY AND STENT PLACEMENT Bilateral 01/29/2022   Procedure: CYSTOSCOPY WITH RETROGRADE PYELOGRAM, URETEROSCOPY, STONE EXTRACTION  AND STENT PLACEMENT holmium laser;  Surgeon: Marcine Matar, MD;  [facility];  Service: Urology;  Laterality: Bilateral;   CYSTOSCOPY WITH STENT PLACEMENT Left 01/22/2022   Procedure: CYSTOSCOPY WITH STENT PLACEMENT;  Surgeon: Belva Agee, MD;  [facility];  Service: Urology;  Laterality: Left;   CYSTOSCOPY WITH URETEROSCOPY, STONE BASKETRY AND STENT PLACEMENT Left 10/14/2022   Procedure: CYSTOSCOPY WITH LEFT URETEROSCOPY, STONE BASKETRY AND LEFTSTENT PLACEMENT,RETROGRADE;  Surgeon: Milderd Meager., MD;  [facility];  Service: Urology;  Laterality: Left;   CYSTOSCOPY/URETEROSCOPY/HOLMIUM LASER/STENT PLACEMENT Left 07/24/2021   Procedure: CYSTOSCOPY/URETEROSCOPY/HOLMIUM LASER/STENT PLACEMENT;  Surgeon: Marcine Matar, MD;  [facility];  Service: Urology;  Laterality: Left;   DILITATION & CURRETTAGE/HYSTROSCOPY WITH HYDROTHERMAL ABLATION N/A 09/13/2012   Procedure: DILATATION & CURETTAGE/HYSTEROSCOPY WITH HYDROTHERMAL ABLATION;  Surgeon: Tereso Newcomer, MD;  [facility];  Service: Gynecology;  Laterality: N/A;   HOLMIUM LASER APPLICATION Left 04/18/2020   Procedure: HOLMIUM LASER LITHOTRIPSY;  Surgeon: Marcine Matar, MD;  [facility];  Service: Urology;  Laterality: Left;   HOLMIUM LASER APPLICATION Right 07/24/2021   Procedure: HOLMIUM LASER APPLICATION;  Surgeon: Marcine Matar, MD;   [facility];  Service: Urology;  Laterality: Right;   HOLMIUM LASER APPLICATION Bilateral 01/29/2022   Procedure: HOLMIUM LASER APPLICATION;  Surgeon: Marcine Matar, MD;  [facility];  Service: Urology;  Laterality: Bilateral;   HOLMIUM LASER APPLICATION  10/14/2022   Procedure: HOLMIUM LASER APPLICATION;  Surgeon: Milderd Meager., MD;  [facility];  Service: Urology;;   IR URETERAL STENT RIGHT NEW ACCESS W/O SEP NEPHROSTOMY CATH  07/24/2021   IR US GUIDANCE  07/24/2021   NEPHROLITHOTOMY Right 07/24/2021   Procedure: NEPHROLITHOTOMY PERCUTANEOUS;  Surgeon: Marcine Matar, MD;  [facility];  Service: Urology;  Laterality: Right;   TUBAL LIGATION  1990    SH: Social History   Tobacco Use   Smoking status: Former    Packs/day: 0.25    Years: 5.00    Additional pack years: 0.00    Total pack years: 1.25    Types: Cigarettes    Quit date: 02/09/2002    Years since quitting: 20.8   Smokeless tobacco: Never  Vaping Use   Vaping Use: Never used  Substance Use Topics   Alcohol use: No   Drug use: No    ROS: Constitutional:  Negative for fever, chills, weight loss CV: Negative for chest pain, previous MI, hypertension Respiratory:  Negative for shortness of breath, wheezing, sleep apnea, frequent cough GI:  Negative for nausea, vomiting, bloody stool, GERD  PE: There were no vitals taken for this visit. GENERAL APPEARANCE:  Well appearing, well developed, well nourished, NAD HEENT:  Atraumatic, normocephalic, oropharynx clear NECK:  Supple without lymphadenopathy or thyromegaly ABDOMEN:  Soft, non-tender, no masses EXTREMITIES:  Moves all extremities well, without clubbing, cyanosis, or edema NEUROLOGIC:  Alert and oriented x 3, normal gait, CN II-XII grossly intact MENTAL STATUS:  appropriate BACK:  Non-tender to palpation, No CVAT SKIN:  Warm, dry, and intact   Results: U/A:

## 2022-12-22 ENCOUNTER — Ambulatory Visit (HOSPITAL_BASED_OUTPATIENT_CLINIC_OR_DEPARTMENT_OTHER)
Admission: RE | Admit: 2022-12-22 | Discharge: 2022-12-22 | Disposition: A | Discharge status: Home / Self Care | Payer: BC Managed Care – PPO | Source: Ambulatory Visit | Attending: Urology | Admitting: Urology

## 2022-12-22 ENCOUNTER — Ambulatory Visit: Payer: BC Managed Care – PPO | Admitting: Urology

## 2022-12-22 ENCOUNTER — Encounter: Payer: Self-pay | Admitting: Urology

## 2022-12-22 VITALS — BP 138/90 | HR 99 | Ht 66.0 in | Wt 260.0 lb

## 2022-12-22 DIAGNOSIS — N1330 Unspecified hydronephrosis: Secondary | ICD-10-CM | POA: Diagnosis not present

## 2022-12-22 DIAGNOSIS — R8290 Unspecified abnormal findings in urine: Secondary | ICD-10-CM

## 2022-12-22 DIAGNOSIS — N201 Calculus of ureter: Secondary | ICD-10-CM | POA: Diagnosis not present

## 2022-12-22 LAB — URINALYSIS, ROUTINE W REFLEX MICROSCOPIC
Bilirubin, UA: NEGATIVE
Glucose, UA: NEGATIVE
Ketones, UA: NEGATIVE
Nitrite, UA: NEGATIVE
Specific Gravity, UA: 1.025 (ref 1.005–1.030)
Urobilinogen, Ur: 0.2 mg/dL (ref 0.2–1.0)
pH, UA: 7 (ref 5.0–7.5)

## 2022-12-22 LAB — MICROSCOPIC EXAMINATION
Cast Type: NONE SEEN
Casts: NONE SEEN /lpf
Crystal Type: NONE SEEN
Crystals: NONE SEEN

## 2022-12-22 NOTE — Progress Notes (Signed)
Assessment: 1. Ureterolithiasis   2. Abnormal urine findings   3. Hydronephrosis of left kidney     Plan: I reviewed the ultrasound study from 12/04/22 showing left hydronephrosis and possible proximal ureteral calculus. I also reviewed the KUB study from today.  There appears to be a persistent calcification at the level of L3 suggestive of a possible proximal ureteral fragment.  Small calcifications are also seen in the lower left renal shadow. I reviewed the patient's prior CT studies back to 2021 and there appears to be a calculus in the left proximal ureter in similar location on each study.  I am suspicious that this could be a submucosal stone fragment.  Review of an operative note from 04/18/2020 suggest that during ureteroscopic stone manipulation ureter perforation was noted with extraluminal stone fragments identified.   It is possible that her hydronephrosis is chronic as well. Urine culture sent today BMP today Recommend further evaluation with CT A/P with and without contrast  Will call with results  Chief Complaint: Chief Complaint  Patient presents with   Nephrolithiasis    HPI: Michele Hall is a 58 y.o. female who presents for continued evaluation of left ureteral calculus.   She presented to the emergency room on 10/13/2022 with left flank pain.  CT demonstrated 2 proximal left ureteral calculi with associated obstruction.  Her creatinine was slightly elevated 1.08.  She was admitted to the hospital for pain control.  She subsequently underwent cystoscopy, left retrograde pyelogram, left ureteroscopic laser lithotripsy with stone manipulation and insertion of a left ureteral stent. KUB from 10/29/2022 showed the left ureteral stent in good position with 2 calcifications along the proximal aspect of the stent measuring 2-3 mm in width. Her stent was removed on 10/29/2022. She was not aware of passing any stone fragments but had not been straining her urine.  She had not  had any significant flank pain at the time of her visit on 11/13/22.   KUB from 11/13/2022 showed a possible left upper ureteral calcification and bilateral renal calculi, left greater than right. Renal ultrasound from 12/04/2022 showed moderate to severe left hydronephrosis with a 1 cm stone in the proximal left ureter and a 6 mm nonobstructing calculus in the inferior right kidney.  She returns today for follow-up.  She has not having any flank pain. No dysuria or gross hematuria.  She is not aware of passing any additional stones.  Portions of the above documentation were copied from a prior visit for review purposes only.  Allergies: Allergies  Allergen Reactions   Fentanyl Other (See Comments)    "Felt like I was going to have a heart attack"    Requip [Ropinirole Hcl] Other (See Comments)    Headache Feels "sick"    PMH: Past Medical History:  Diagnosis Date   Anal fissure - posterior 2015   Complication of anesthesia    Pt states she heard her "oxygen dropped during procedure"   Gallstones    GERD (gastroesophageal reflux disease)    History of kidney stones    Hypertension    Morbid obesity (HCC)     PSH: Past Surgical History:  Procedure Laterality Date   CHOLECYSTECTOMY  1988   CYSTOSCOPY WITH RETROGRADE PYELOGRAM, URETEROSCOPY AND STENT PLACEMENT Left 04/18/2020   Procedure: CYSTOSCOPY WITH RETROGRADE PYELOGRAM, URETEROSCOPY , STONE EXTRACTION AND STENT PLACEMENT;  Surgeon: Marcine Matar, MD;  [facility];  Service: Urology;  Laterality: Left;  75 MINS   CYSTOSCOPY WITH RETROGRADE PYELOGRAM,  URETEROSCOPY AND STENT PLACEMENT Bilateral 01/29/2022   Procedure: CYSTOSCOPY WITH RETROGRADE PYELOGRAM, URETEROSCOPY, STONE EXTRACTION  AND STENT PLACEMENT holmium laser;  Surgeon: Marcine Matar, MD;  [facility];  Service: Urology;  Laterality: Bilateral;   CYSTOSCOPY WITH STENT PLACEMENT Left 01/22/2022   Procedure: CYSTOSCOPY WITH STENT PLACEMENT;  Surgeon:  Belva Agee, MD;  [facility];  Service: Urology;  Laterality: Left;   CYSTOSCOPY WITH URETEROSCOPY, STONE BASKETRY AND STENT PLACEMENT Left 10/14/2022   Procedure: CYSTOSCOPY WITH LEFT URETEROSCOPY, STONE BASKETRY AND LEFTSTENT PLACEMENT,RETROGRADE;  Surgeon: Milderd Meager., MD;  [facility];  Service: Urology;  Laterality: Left;   CYSTOSCOPY/URETEROSCOPY/HOLMIUM LASER/STENT PLACEMENT Left 07/24/2021   Procedure: CYSTOSCOPY/URETEROSCOPY/HOLMIUM LASER/STENT PLACEMENT;  Surgeon: Marcine Matar, MD;  [facility];  Service: Urology;  Laterality: Left;   DILITATION & CURRETTAGE/HYSTROSCOPY WITH HYDROTHERMAL ABLATION N/A 09/13/2012   Procedure: DILATATION & CURETTAGE/HYSTEROSCOPY WITH HYDROTHERMAL ABLATION;  Surgeon: Tereso Newcomer, MD;  [facility];  Service: Gynecology;  Laterality: N/A;   HOLMIUM LASER APPLICATION Left 04/18/2020   Procedure: HOLMIUM LASER LITHOTRIPSY;  Surgeon: Marcine Matar, MD;  [facility];  Service: Urology;  Laterality: Left;   HOLMIUM LASER APPLICATION Right 07/24/2021   Procedure: HOLMIUM LASER APPLICATION;  Surgeon: Marcine Matar, MD;  [facility];  Service: Urology;  Laterality: Right;   HOLMIUM LASER APPLICATION Bilateral 01/29/2022   Procedure: HOLMIUM LASER APPLICATION;  Surgeon: Marcine Matar, MD;  [facility];  Service: Urology;  Laterality: Bilateral;   HOLMIUM LASER APPLICATION  10/14/2022   Procedure: HOLMIUM LASER APPLICATION;  Surgeon: Milderd Meager., MD;  [facility];  Service: Urology;;   IR URETERAL STENT RIGHT NEW ACCESS W/O SEP NEPHROSTOMY CATH  07/24/2021   IR US GUIDANCE  07/24/2021   NEPHROLITHOTOMY Right 07/24/2021   Procedure: NEPHROLITHOTOMY PERCUTANEOUS;  Surgeon: Marcine Matar, MD;  [facility];  Service: Urology;  Laterality: Right;   TUBAL LIGATION  1990    SH: Social History   Tobacco Use   Smoking status: Former    Current packs/day: 0.00    Average  packs/day: 0.3 packs/day for 5.0 years (1.3 ttl pk-yrs)    Types: Cigarettes    Start date: 02/09/1997    Quit date: 02/09/2002    Years since quitting: 20.8   Smokeless tobacco: Never  Vaping Use   Vaping status: Never Used  Substance Use Topics   Alcohol use: No   Drug use: No    ROS: Constitutional:  Negative for fever, chills, weight loss CV: Negative for chest pain, previous MI, hypertension Respiratory:  Negative for shortness of breath, wheezing, sleep apnea, frequent cough GI:  Negative for nausea, vomiting, bloody stool, GERD  PE: BP (!) 138/90   Pulse 99   Ht 5\' 6"  (1.676 m)   Wt 260 lb (117.9 kg)   BMI 41.97 kg/m  GENERAL APPEARANCE:  Well appearing, well developed, well nourished, NAD HEENT:  Atraumatic, normocephalic, oropharynx clear NECK:  Supple without lymphadenopathy or thyromegaly ABDOMEN:  Soft, non-tender, no masses EXTREMITIES:  Moves all extremities well, without clubbing, cyanosis, or edema NEUROLOGIC:  Alert and oriented x 3, normal gait, CN II-XII grossly intact MENTAL STATUS:  appropriate BACK:  Non-tender to palpation, No CVAT SKIN:  Warm, dry, and intact   Results: Results for orders placed or performed in visit on 12/22/22 (from the past 24 hour(s))  Urinalysis, Routine w reflex microscopic     Status: Abnormal   Collection Time: 12/22/22 10:43 AM  Result  Value Ref Range   Specific Gravity, UA 1.025 1.005 - 1.030   pH, UA 7.0 5.0 - 7.5   Color, UA Yellow Yellow   Appearance Ur Hazy (A) Clear   Leukocytes,UA 1+ (A) Negative   Protein,UA Trace (A) Negative/Trace   Glucose, UA Negative Negative   Ketones, UA Negative Negative   RBC, UA Trace (A) Negative   Bilirubin, UA Negative Negative   Urobilinogen, Ur 0.2 0.2 - 1.0 mg/dL   Nitrite, UA Negative Negative   Microscopic Examination See below:    Narrative   Performed at:  [address] [facility]  Urology [facility] [address], [place], [place]  [id number] Lab Director: Corinna Lines MT, Phone:  [phone]  Microscopic Examination     Status: Abnormal   Collection Time: 12/22/22 10:43 AM   Urine  Result Value Ref Range   WBC, UA 11-30 (A) 0 - 5 /hpf   RBC, Urine 3-10 (A) 0 - 2 /hpf   Epithelial Cells (non renal) 0-10 0 - 10 /hpf   Renal Epithel, UA Present (A) None seen /hpf   Casts None seen None seen /lpf   Cast Type None seen N/A   Crystals None seen N/A   Crystal Type None seen N/A   Mucus, UA Present (A) Not Estab.   Bacteria, UA Many (A) None seen/Few   Narrative   Performed at:  [address] [facility]  Urology [facility] [address], [place], [place]  [id number] Lab Director: Corinna Lines MT, Phone:  [phone]

## 2022-12-23 LAB — BASIC METABOLIC PANEL
BUN/Creatinine Ratio: 16 (ref 9–23)
BUN: 11 mg/dL (ref 6–24)
CO2: 20 mmol/L (ref 20–29)
Calcium: 9.3 mg/dL (ref 8.7–10.2)
Chloride: 103 mmol/L (ref 96–106)
Creatinine, Ser: 0.7 mg/dL (ref 0.57–1.00)
Glucose: 89 mg/dL (ref 70–99)
Potassium: 4.1 mmol/L (ref 3.5–5.2)
Sodium: 138 mmol/L (ref 134–144)
eGFR: 102 mL/min/{1.73_m2} (ref >59)

## 2022-12-23 NOTE — Addendum Note (Signed)
Addended by: Milderd Meager on: 12/23/2022 12:59 PM   Modules accepted: Orders

## 2022-12-24 ENCOUNTER — Encounter: Payer: Self-pay | Admitting: Urology

## 2022-12-24 ENCOUNTER — Other Ambulatory Visit: Payer: Self-pay | Admitting: Family Medicine

## 2022-12-24 LAB — URINE CULTURE

## 2022-12-24 MED ORDER — SPIRONOLACTONE 50 MG PO TABS: 50.0000 mg | ORAL_TABLET | Freq: Every day | ORAL | 1 refills | Status: DC

## 2023-03-24 ENCOUNTER — Encounter: Payer: Self-pay | Admitting: Family Medicine

## 2023-03-24 ENCOUNTER — Ambulatory Visit: Payer: BC Managed Care – PPO | Admitting: Family Medicine

## 2023-03-24 VITALS — BP 100/70 | HR 119 | Temp 97.9°F | Ht 66.25 in | Wt 268.1 lb

## 2023-03-24 DIAGNOSIS — M19019 Primary osteoarthritis, unspecified shoulder: Secondary | ICD-10-CM

## 2023-03-24 DIAGNOSIS — M7541 Impingement syndrome of right shoulder: Secondary | ICD-10-CM | POA: Diagnosis not present

## 2023-03-24 DIAGNOSIS — R000 Tachycardia, unspecified: Secondary | ICD-10-CM

## 2023-03-24 DIAGNOSIS — M7581 Other shoulder lesions, right shoulder: Secondary | ICD-10-CM | POA: Diagnosis not present

## 2023-03-24 MED ORDER — TRIAMCINOLONE ACETONIDE 40 MG/ML IJ SUSP
20.0000 mg | Freq: Once | INTRAMUSCULAR | Status: AC
Start: 2023-03-24 — End: 2023-03-24
  Administered 2023-03-24: 20 mg via INTRA_ARTICULAR

## 2023-03-24 MED ORDER — TRIAMCINOLONE ACETONIDE 40 MG/ML IJ SUSP
40.0000 mg | Freq: Once | INTRAMUSCULAR | Status: AC
Start: 2023-03-24 — End: 2023-03-24
  Administered 2023-03-24: 40 mg via INTRA_ARTICULAR

## 2023-03-24 NOTE — Progress Notes (Signed)
KENALOG-40) injection 40 mg    4. Elevated pulse rate  R00.0 EKG 12-Lead     EKG: Normal sinus rhythm. Rate 114.  Normal axis, normal R wave progression, No acute ST elevation or depression.   The patient is here for shoulder evaluation and we also found that her pulse was roughly 120.  On chart review, it has been elevated over 100 for almost 2 years at least.  She is asymptomatic.  Normal rhythm on EKG.  Discussed the case with her primary care doctor, and I am going to have the patient stop spironolactone and start metoprolol XR 25 mg  a day.  Right-sided shoulder, consistent with rotator cuff tendinopathy, also think she is having some significant AC joint pain from [facility] arthropathy.  Gave the patient a comprehensive rehab program.  Will also do an [facility] joint injection in addition to a subacromial injection today.  Aspiration/Injection Procedure Note Michele Hall [birth year] Date of procedure: 03/24/2023  Procedure: Large Joint Aspiration / Injection of Shoulder, Subacromial, R Indications: Pain  Procedure Details Verbal consent was obtained from the patient. Risks, benefits, and alternatives were explained. Patient prepped with Chloraprep and Ethyl Chloride used for anesthesia. The subacromial space was injected using the posterior approach. The patient tolerated the procedure well and had decreased pain post injection. No complications. Injection: 9 cc of Lidocaine 1% and 1 mL of Kenalog 40 mg. Needle: 22 gauge Medication: 1 mL of Kenalog 40 mg   Aspiration/Injection Procedure Note Michele Hall [birth year] Date of procedure: 03/24/2023  Procedure: Small Joint Aspiration / Injection of AC joint, R  Indications: Pain  Procedure Details Verbal consent was obtained from the patient. Risks, benefits, and alternatives have been reviewed. The patient was prepped with Chloraprep. Ethyl Chloride used for anesthesia. Under sterile conditions, 1/2 cc of Lidocaine 1% and Kenalog 20 mg directly injected into at the superior-lateral AC joint. The patient tolerated the procedure well and had decreased symptoms after injection. No complications.  Medication: 1/2 cc of Kenalog 40 mg (equaling Kenalog 20 mg)   Medication Management during today's office visit: Meds ordered this encounter  Medications   triamcinolone acetonide (KENALOG-40) injection 20 mg   triamcinolone acetonide (KENALOG-40) injection 40 mg   Medications Discontinued During This Encounter  Medication Reason   aspirin EC 81 MG tablet Completed Course    phenazopyridine (PYRIDIUM) 200 MG tablet Completed Course    Orders placed today for conditions managed today: Orders Placed This Encounter  Procedures   EKG 12-Lead    Disposition: No follow-ups on file.  [employer] speech-to-text software was used for transcription in this dictation.  Possible transcriptional errors can occur using Animal nutritionist.   Signed,  Elpidio Galea. Zara Wendt, MD   Outpatient Encounter Medications as of 03/24/2023  Medication Sig   cyanocobalamin (VITAMIN B12) 1000 MCG tablet Take 1,000 mcg by mouth 3 (three) times a week.   spironolactone (ALDACTONE) 50 MG tablet Take 1 tablet (50 mg total) by mouth daily.   tamsulosin (FLOMAX) 0.4 MG CAPS capsule Take 1 capsule (0.4 mg total) by mouth daily.   [DISCONTINUED] aspirin EC 81 MG tablet Take 81 mg by mouth daily. Swallow whole.   [DISCONTINUED] phenazopyridine (PYRIDIUM) 200 MG tablet Take 1 tablet (200 mg total) by mouth 3 (three) times daily as needed for pain.   [EXPIRED] triamcinolone acetonide (KENALOG-40) injection 20 mg    [EXPIRED] triamcinolone acetonide (KENALOG-40) injection 40 mg    No facility-administered encounter medications on file as  KENALOG-40) injection 40 mg    4. Elevated pulse rate  R00.0 EKG 12-Lead     EKG: Normal sinus rhythm. Rate 114.  Normal axis, normal R wave progression, No acute ST elevation or depression.   The patient is here for shoulder evaluation and we also found that her pulse was roughly 120.  On chart review, it has been elevated over 100 for almost 2 years at least.  She is asymptomatic.  Normal rhythm on EKG.  Discussed the case with her primary care doctor, and I am going to have the patient stop spironolactone and start metoprolol XR 25 mg  a day.  Right-sided shoulder, consistent with rotator cuff tendinopathy, also think she is having some significant AC joint pain from [facility] arthropathy.  Gave the patient a comprehensive rehab program.  Will also do an [facility] joint injection in addition to a subacromial injection today.  Aspiration/Injection Procedure Note Michele Hall [birth year] Date of procedure: 03/24/2023  Procedure: Large Joint Aspiration / Injection of Shoulder, Subacromial, R Indications: Pain  Procedure Details Verbal consent was obtained from the patient. Risks, benefits, and alternatives were explained. Patient prepped with Chloraprep and Ethyl Chloride used for anesthesia. The subacromial space was injected using the posterior approach. The patient tolerated the procedure well and had decreased pain post injection. No complications. Injection: 9 cc of Lidocaine 1% and 1 mL of Kenalog 40 mg. Needle: 22 gauge Medication: 1 mL of Kenalog 40 mg   Aspiration/Injection Procedure Note Michele Hall [birth year] Date of procedure: 03/24/2023  Procedure: Small Joint Aspiration / Injection of AC joint, R  Indications: Pain  Procedure Details Verbal consent was obtained from the patient. Risks, benefits, and alternatives have been reviewed. The patient was prepped with Chloraprep. Ethyl Chloride used for anesthesia. Under sterile conditions, 1/2 cc of Lidocaine 1% and Kenalog 20 mg directly injected into at the superior-lateral AC joint. The patient tolerated the procedure well and had decreased symptoms after injection. No complications.  Medication: 1/2 cc of Kenalog 40 mg (equaling Kenalog 20 mg)   Medication Management during today's office visit: Meds ordered this encounter  Medications   triamcinolone acetonide (KENALOG-40) injection 20 mg   triamcinolone acetonide (KENALOG-40) injection 40 mg   Medications Discontinued During This Encounter  Medication Reason   aspirin EC 81 MG tablet Completed Course    phenazopyridine (PYRIDIUM) 200 MG tablet Completed Course    Orders placed today for conditions managed today: Orders Placed This Encounter  Procedures   EKG 12-Lead    Disposition: No follow-ups on file.  [employer] speech-to-text software was used for transcription in this dictation.  Possible transcriptional errors can occur using Animal nutritionist.   Signed,  Elpidio Galea. Zara Wendt, MD   Outpatient Encounter Medications as of 03/24/2023  Medication Sig   cyanocobalamin (VITAMIN B12) 1000 MCG tablet Take 1,000 mcg by mouth 3 (three) times a week.   spironolactone (ALDACTONE) 50 MG tablet Take 1 tablet (50 mg total) by mouth daily.   tamsulosin (FLOMAX) 0.4 MG CAPS capsule Take 1 capsule (0.4 mg total) by mouth daily.   [DISCONTINUED] aspirin EC 81 MG tablet Take 81 mg by mouth daily. Swallow whole.   [DISCONTINUED] phenazopyridine (PYRIDIUM) 200 MG tablet Take 1 tablet (200 mg total) by mouth 3 (three) times daily as needed for pain.   [EXPIRED] triamcinolone acetonide (KENALOG-40) injection 20 mg    [EXPIRED] triamcinolone acetonide (KENALOG-40) injection 40 mg    No facility-administered encounter medications on file as  Aveena Bari T. Ritik Stavola, MD, CAQ Sports Medicine [facility] at [facility] [address] [place], [address]  Phone: [phone]  FAX: [phone]  BOBBIEJO ECKMAN - 57 y.o. female  MRN [id number]  Date of Birth: 22-Jan-1966  Date: 03/24/2023  PCP: Excell Seltzer, MD  Referral: Excell Seltzer, MD  Chief Complaint  Patient presents with   Shoulder Pain    Right   Subjective:   Michele Hall is a 57 y.o. very pleasant female patient with Body mass index is 42.95 kg/m. who presents with the following:  Presents today with a problem of primary right sided shoulder pain that has been present over the last 2 or 3 months.  She does not recall any kind of specific injury, fall or inciting event.  She does recall having her arms underneath her and when she reached she had some pain and she has now had pain with lifting things and abducting as well as rotational maneuvers of the shoulder.  She has tried some NSAIDs at home.  They seem to have not helped all that much.  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  Tachycardic on exam to 120.  On chart review it looks as if she has been tachycardic for well more than a year. Her last EKG from February 2023 had a rate of 114. She denies any chest pain or shortness of breath. -She is currently on spironolactone for blood pressure She was on an additional agent: Indapamide.  Does not really remember hurting it at all. Can remember laying on her arms, and her R arm was asleep.  Reached for it and she got some severe pain.  No injury or fallen.   Has been hurting a lot for a couple of months. T-shirt distribution of pain.   Intraarticular injection R AC joint injection  Tachycardia - on spironolactone  Review of Systems is noted in the HPI, as appropriate  Objective:   BP 100/70 (BP Location: Left Arm, Patient Position: Sitting, Cuff  Size: Large)   Pulse (!) 119   Temp 97.9 F (36.6 C) (Temporal)   Ht 5' 6.25" (1.683 m)   Wt 268 lb 2 oz (121.6 kg)   SpO2 96%   BMI 42.95 kg/m   GEN: No acute distress; alert,appropriate. PULM: Breathing comfortably in no respiratory distress PSYCH: Normally interactive.  CV: Regular rhythm.  Rate to 110.  No murmurs gallops or rubs.  Shoulder: R Inspection: No muscle wasting or winging Ecchymosis/edema: neg  AC joint, scapula, clavicle: Notably tender to palpation Cervical spine: NT, full ROM Spurling's: neg Abduction: full, 4+/5 Flexion: full, 5/5 IR, full, lift-off: 5/5 ER at neutral: full, 4/5 AC crossover: pos Neer: pos Hawkins: pos Drop Test: neg Jobe: pos Supraspinatus insertion: mild-mod T Bicipital groove: NT Speed's: neg Yergason's: neg Sulcus sign: neg Scapular dyskinesis: none C5-T1 intact  Neuro: Sensation intact Grip 5/5   Laboratory and Imaging Data:  Assessment and Plan:     ICD-10-CM   1. Impingement syndrome of right shoulder  M75.41 triamcinolone acetonide (KENALOG-40) injection 20 mg    triamcinolone acetonide (KENALOG-40) injection 40 mg    2. AC joint arthropathy  M19.019 triamcinolone acetonide (KENALOG-40) injection 20 mg    triamcinolone acetonide (KENALOG-40) injection 40 mg    3. Right rotator cuff tendonitis  M75.81 triamcinolone acetonide (KENALOG-40) injection 20 mg    triamcinolone acetonide (

## 2023-03-27 ENCOUNTER — Encounter: Payer: Self-pay | Admitting: Family Medicine

## 2023-03-27 MED ORDER — METOPROLOL SUCCINATE ER 25 MG PO TB24
25.0000 mg | ORAL_TABLET | Freq: Every day | ORAL | 2 refills | Status: DC
Start: 2023-03-27 — End: 2023-05-11

## 2023-03-29 ENCOUNTER — Telehealth: Payer: Self-pay | Admitting: *Deleted

## 2023-03-29 NOTE — Telephone Encounter (Signed)
Mrs. Hoelle notified as instructed by telephone.  They are currently away on vacation so she will not be able to start the Metoprolol until next week.  She will continue the spironolactone until then.  Follow up appointment scheduled with Dr. Ermalene Searing 04/20/2023 at 10:40 am.  FYI to Dr. Ermalene Searing and Dr. Patsy Lager.

## 2023-03-29 NOTE — Telephone Encounter (Signed)
-----   Message from Folsom Copland sent at 03/27/2023  4:28 PM EDT ----- Lupita Leash, can you call the patient?  Dr. Ermalene Searing and I reviewed her heart rate.  We would like for her to stop spironolactone, and I sent in a new medication metoprolol for her blood pressure instead.  Please arrange follow-up with Dr. Ermalene Searing in 3 to 4 weeks to recheck blood pressure and pulse.

## 2023-04-06 ENCOUNTER — Emergency Department (HOSPITAL_COMMUNITY): Payer: BC Managed Care – PPO | Admitting: Anesthesiology

## 2023-04-06 ENCOUNTER — Encounter (HOSPITAL_COMMUNITY)
Admission: EM | Disposition: A | Discharge status: Home / Self Care | Payer: Self-pay | Source: Home / Self Care | Attending: Emergency Medicine

## 2023-04-06 ENCOUNTER — Emergency Department (HOSPITAL_COMMUNITY): Payer: BC Managed Care – PPO

## 2023-04-06 ENCOUNTER — Encounter (HOSPITAL_COMMUNITY): Payer: Self-pay | Admitting: Emergency Medicine

## 2023-04-06 ENCOUNTER — Ambulatory Visit (HOSPITAL_COMMUNITY)
Admission: EM | Admit: 2023-04-06 | Discharge: 2023-04-06 | Disposition: A | Discharge status: Home / Self Care | Payer: BC Managed Care – PPO | Attending: Emergency Medicine | Admitting: Emergency Medicine

## 2023-04-06 ENCOUNTER — Other Ambulatory Visit: Payer: Self-pay

## 2023-04-06 DIAGNOSIS — Z79899 Other long term (current) drug therapy: Secondary | ICD-10-CM | POA: Insufficient documentation

## 2023-04-06 DIAGNOSIS — Z6841 Body Mass Index (BMI) 40.0 and over, adult: Secondary | ICD-10-CM | POA: Diagnosis not present

## 2023-04-06 DIAGNOSIS — I10 Essential (primary) hypertension: Secondary | ICD-10-CM | POA: Insufficient documentation

## 2023-04-06 DIAGNOSIS — N201 Calculus of ureter: Secondary | ICD-10-CM

## 2023-04-06 DIAGNOSIS — N132 Hydronephrosis with renal and ureteral calculous obstruction: Secondary | ICD-10-CM | POA: Insufficient documentation

## 2023-04-06 DIAGNOSIS — Z87891 Personal history of nicotine dependence: Secondary | ICD-10-CM | POA: Insufficient documentation

## 2023-04-06 HISTORY — PX: CYSTOSCOPY W/ URETERAL STENT PLACEMENT: SHX1429

## 2023-04-06 LAB — COMPREHENSIVE METABOLIC PANEL
ALT: 29 U/L (ref 0–44)
AST: 25 U/L (ref 15–41)
Albumin: 4 g/dL (ref 3.5–5.0)
Alkaline Phosphatase: 70 U/L (ref 38–126)
Anion gap: 11 (ref 5–15)
BUN: 27 mg/dL — ABNORMAL HIGH (ref 6–20)
CO2: 21 mmol/L — ABNORMAL LOW (ref 22–32)
Calcium: 8.7 mg/dL — ABNORMAL LOW (ref 8.9–10.3)
Chloride: 103 mmol/L (ref 98–111)
Creatinine, Ser: 1.46 mg/dL — ABNORMAL HIGH (ref 0.44–1.00)
GFR, Estimated: 42 mL/min — ABNORMAL LOW (ref >60)
Glucose, Bld: 114 mg/dL — ABNORMAL HIGH (ref 70–99)
Potassium: 3.9 mmol/L (ref 3.5–5.1)
Sodium: 135 mmol/L (ref 135–145)
Total Bilirubin: 0.7 mg/dL (ref 0.3–1.2)
Total Protein: 7.5 g/dL (ref 6.5–8.1)

## 2023-04-06 LAB — URINALYSIS, ROUTINE W REFLEX MICROSCOPIC
Bilirubin Urine: NEGATIVE
Glucose, UA: NEGATIVE mg/dL
Ketones, ur: NEGATIVE mg/dL
Nitrite: NEGATIVE
Protein, ur: NEGATIVE mg/dL
Specific Gravity, Urine: 1.013 (ref 1.005–1.030)
pH: 6 (ref 5.0–8.0)

## 2023-04-06 LAB — CBC WITH DIFFERENTIAL/PLATELET
Abs Immature Granulocytes: 0.06 10*3/uL (ref 0.00–0.07)
Basophils Absolute: 0 10*3/uL (ref 0.0–0.1)
Basophils Relative: 0 %
Eosinophils Absolute: 0.2 10*3/uL (ref 0.0–0.5)
Eosinophils Relative: 1 %
HCT: 42.5 % (ref 36.0–46.0)
Hemoglobin: 14.7 g/dL (ref 12.0–15.0)
Immature Granulocytes: 1 %
Lymphocytes Relative: 15 %
Lymphs Abs: 1.6 10*3/uL (ref 0.7–4.0)
MCH: 32.7 pg (ref 26.0–34.0)
MCHC: 34.6 g/dL (ref 30.0–36.0)
MCV: 94.7 fL (ref 80.0–100.0)
Monocytes Absolute: 1.2 10*3/uL — ABNORMAL HIGH (ref 0.1–1.0)
Monocytes Relative: 12 %
Neutro Abs: 7.5 10*3/uL (ref 1.7–7.7)
Neutrophils Relative %: 71 %
Platelets: 211 10*3/uL (ref 150–400)
RBC: 4.49 MIL/uL (ref 3.87–5.11)
RDW: 12.1 % (ref 11.5–15.5)
WBC: 10.6 10*3/uL — ABNORMAL HIGH (ref 4.0–10.5)
nRBC: 0 % (ref 0.0–0.2)

## 2023-04-06 SURGERY — CYSTOSCOPY, WITH RETROGRADE PYELOGRAM AND URETERAL STENT INSERTION
Anesthesia: General | Site: Ureter | Laterality: Left

## 2023-04-06 MED ORDER — FENTANYL CITRATE (PF) 100 MCG/2ML IJ SOLN
INTRAMUSCULAR | Status: DC | PRN
  Administered 2023-04-06 (×2): 50 ug via INTRAVENOUS

## 2023-04-06 MED ORDER — CHLORHEXIDINE GLUCONATE 0.12 % MT SOLN
15.0000 mL | Freq: Once | OROMUCOSAL | Status: AC
  Administered 2023-04-06: 15 mL via OROMUCOSAL

## 2023-04-06 MED ORDER — FENTANYL CITRATE (PF) 100 MCG/2ML IJ SOLN
INTRAMUSCULAR | Status: AC
  Filled 2023-04-06: qty 2

## 2023-04-06 MED ORDER — HYDROMORPHONE HCL 1 MG/ML IJ SOLN
0.5000 mg | INTRAMUSCULAR | Status: DC | PRN
  Administered 2023-04-06 (×2): 0.5 mg via INTRAVENOUS
  Filled 2023-04-06 (×2): qty 1

## 2023-04-06 MED ORDER — PHENYLEPHRINE 80 MCG/ML (10ML) SYRINGE FOR IV PUSH (FOR BLOOD PRESSURE SUPPORT)
PREFILLED_SYRINGE | INTRAVENOUS | Status: AC
  Filled 2023-04-06: qty 50

## 2023-04-06 MED ORDER — ONDANSETRON HCL 4 MG/2ML IJ SOLN: 4.0000 mg | Freq: Once | INTRAMUSCULAR | Status: DC | PRN

## 2023-04-06 MED ORDER — LACTATED RINGERS IV SOLN: Freq: Once | INTRAVENOUS | Status: AC

## 2023-04-06 MED ORDER — HYDROMORPHONE HCL 1 MG/ML IJ SOLN: 0.2500 mg | INTRAMUSCULAR | Status: DC | PRN

## 2023-04-06 MED ORDER — PHENYLEPHRINE HCL (PRESSORS) 10 MG/ML IV SOLN
INTRAVENOUS | Status: DC | PRN
  Administered 2023-04-06: 120 ug via INTRAVENOUS
  Administered 2023-04-06: 80 ug via INTRAVENOUS

## 2023-04-06 MED ORDER — CEFAZOLIN SODIUM-DEXTROSE 2-4 GM/100ML-% IV SOLN
2.0000 g | Freq: Once | INTRAVENOUS | Status: AC
  Administered 2023-04-06: 2 g via INTRAVENOUS
  Filled 2023-04-06: qty 100

## 2023-04-06 MED ORDER — ACETAMINOPHEN 10 MG/ML IV SOLN: 1000.0000 mg | Freq: Once | INTRAVENOUS | Status: DC | PRN

## 2023-04-06 MED ORDER — ONDANSETRON HCL 4 MG/2ML IJ SOLN
INTRAMUSCULAR | Status: AC
  Filled 2023-04-06: qty 10

## 2023-04-06 MED ORDER — PROPOFOL 10 MG/ML IV BOLUS
INTRAVENOUS | Status: DC | PRN
  Administered 2023-04-06: 200 mg via INTRAVENOUS

## 2023-04-06 MED ORDER — KETOROLAC TROMETHAMINE 15 MG/ML IJ SOLN
15.0000 mg | Freq: Once | INTRAMUSCULAR | Status: AC
  Administered 2023-04-06: 15 mg via INTRAVENOUS
  Filled 2023-04-06: qty 1

## 2023-04-06 MED ORDER — MIDAZOLAM HCL 2 MG/2ML IJ SOLN
INTRAMUSCULAR | Status: AC
  Filled 2023-04-06: qty 2

## 2023-04-06 MED ORDER — ONDANSETRON HCL 4 MG/2ML IJ SOLN
INTRAMUSCULAR | Status: DC | PRN
  Administered 2023-04-06: 4 mg via INTRAVENOUS

## 2023-04-06 MED ORDER — LACTATED RINGERS IV SOLN: INTRAVENOUS | Status: DC | PRN

## 2023-04-06 MED ORDER — ONDANSETRON HCL 4 MG/2ML IJ SOLN
4.0000 mg | Freq: Once | INTRAMUSCULAR | Status: AC
  Administered 2023-04-06: 4 mg via INTRAVENOUS
  Filled 2023-04-06: qty 2

## 2023-04-06 MED ORDER — HYDROCODONE-ACETAMINOPHEN 5-325 MG PO TABS: 1.0000 | ORAL_TABLET | ORAL | 0 refills | Status: DC | PRN

## 2023-04-06 MED ORDER — SODIUM CHLORIDE 0.9 % IR SOLN
Status: DC | PRN
  Administered 2023-04-06: 3000 mL

## 2023-04-06 MED ORDER — LIDOCAINE HCL (PF) 2 % IJ SOLN
INTRAMUSCULAR | Status: DC | PRN
  Administered 2023-04-06: 100 mg via INTRADERMAL

## 2023-04-06 MED ORDER — DEXAMETHASONE SODIUM PHOSPHATE 4 MG/ML IJ SOLN
INTRAMUSCULAR | Status: DC | PRN
  Administered 2023-04-06: 5 mg via INTRAVENOUS

## 2023-04-06 MED ORDER — IOHEXOL 300 MG/ML  SOLN
INTRAMUSCULAR | Status: DC | PRN
  Administered 2023-04-06: 10 mL

## 2023-04-06 MED ORDER — DEXAMETHASONE SODIUM PHOSPHATE 10 MG/ML IJ SOLN
INTRAMUSCULAR | Status: AC
  Filled 2023-04-06: qty 2

## 2023-04-06 MED ORDER — EPHEDRINE 5 MG/ML INJ
INTRAVENOUS | Status: AC
Start: 2023-04-06 — End: ?
  Filled 2023-04-06: qty 20

## 2023-04-06 MED ORDER — MIDAZOLAM HCL 5 MG/5ML IJ SOLN
INTRAMUSCULAR | Status: DC | PRN
  Administered 2023-04-06: 2 mg via INTRAVENOUS

## 2023-04-06 SURGICAL SUPPLY — 13 items
BAG URO CATCHER STRL LF (MISCELLANEOUS) ×1 IMPLANT
CATH URETL OPEN END 6FR 70 (CATHETERS) ×1 IMPLANT
CLOTH BEACON ORANGE TIMEOUT ST (SAFETY) ×1 IMPLANT
GLOVE BIO SURGEON STRL SZ7.5 (GLOVE) ×1 IMPLANT
GOWN STRL REUS W/ TWL XL LVL3 (GOWN DISPOSABLE) ×1 IMPLANT
GOWN STRL REUS W/TWL XL LVL3 (GOWN DISPOSABLE) ×1
GUIDEWIRE STR DUAL SENSOR (WIRE) ×1 IMPLANT
KIT TURNOVER KIT A (KITS) IMPLANT
MANIFOLD NEPTUNE II (INSTRUMENTS) ×1 IMPLANT
PACK CYSTO (CUSTOM PROCEDURE TRAY) ×1 IMPLANT
PAD PREP 24X48 CUFFED NSTRL (MISCELLANEOUS) ×1 IMPLANT
TUBING CONNECTING 10 (TUBING) ×1 IMPLANT
TUBING UROLOGY SET (TUBING) IMPLANT

## 2023-04-06 NOTE — ED Provider Notes (Signed)
  Physical Exam  BP 125/81   Pulse 92   Temp 97.7 F (36.5 C) (Oral)   Resp (!) 25   Ht 5\' 6"  (1.676 m)   Wt 121.6 kg   SpO2 96%   BMI 43.26 kg/m   Physical Exam  Procedures  Procedures  ED Course / MDM    Medical Decision Making Amount and/or Complexity of Data Reviewed Labs: ordered. Radiology: ordered.  Risk Prescription drug management. Decision regarding hospitalization.   Patient had come in with chief complaint of flank pain. Pt has 6 mm stone on CT scan and AKI. Creatinine is more than doubled.  Patient does not have nausea and vomiting.  She has required instrumentation in the past because of her kidney stone.  I consulted Dr. Alvester Morin with urology.  APP is seeing the patient and advised admission to the OR.       Derwood Kaplan, MD 04/06/23 [phone]

## 2023-04-06 NOTE — Anesthesia Procedure Notes (Signed)
Procedure Name: LMA Insertion Date/Time: 04/06/2023 6:21 PM  Performed by: Nathen May, CRNAPre-anesthesia Checklist: Patient identified, Emergency Drugs available, Suction available and Patient being monitored Patient Re-evaluated:Patient Re-evaluated prior to induction Oxygen Delivery Method: Circle System Utilized Preoxygenation: Pre-oxygenation with 100% oxygen Induction Type: IV induction Ventilation: Mask ventilation without difficulty LMA: LMA inserted LMA Size: 4.0 Number of attempts: 1 Airway Equipment and Method: Bite block Placement Confirmation: positive ETCO2 Tube secured with: Tape Dental Injury: Teeth and Oropharynx as per pre-operative assessment

## 2023-04-06 NOTE — Transfer of Care (Signed)
Immediate Anesthesia Transfer of Care Note  Patient: Michele Hall  Procedure(s) Performed: CYSTOSCOPY WITH RETROGRADE PYELOGRAM/URETERAL STENT PLACEMENT (Left: Ureter)  Patient Location: PACU  Anesthesia Type:General  Level of Consciousness: awake, alert , oriented, and drowsy  Airway & Oxygen Therapy: Patient Spontanous Breathing and Patient connected to nasal cannula oxygen  Post-op Assessment: Report given to RN and Post -op Vital signs reviewed and stable  Post vital signs: Reviewed and stable  Last Vitals:  Vitals Value Taken Time  BP 111/64 04/06/23 1843  Temp    Pulse 83 04/06/23 1844  Resp 3 04/06/23 1844  SpO2 94 % 04/06/23 1844  Vitals shown include unfiled device data.  Last Pain:  Vitals:   04/06/23 1644  TempSrc: Oral  PainSc:          Complications: No notable events documented.

## 2023-04-06 NOTE — Anesthesia Postprocedure Evaluation (Signed)
Anesthesia Post Note  Patient: Michele Hall  Procedure(s) Performed: CYSTOSCOPY WITH RETROGRADE PYELOGRAM/URETERAL STENT PLACEMENT (Left: Ureter)     Patient location during evaluation: PACU Anesthesia Type: General Level of consciousness: awake and sedated Pain management: pain level controlled Vital Signs Assessment: post-procedure vital signs reviewed and stable Respiratory status: spontaneous breathing Cardiovascular status: stable Postop Assessment: no apparent nausea or vomiting Anesthetic complications: no  No notable events documented.  Last Vitals:  Vitals:   04/06/23 1845 04/06/23 1900  BP: 109/68 105/65  Pulse: 83 80  Resp: 15 16  Temp: 36.8 C   SpO2: 94% 97%    Last Pain:  Vitals:   04/06/23 1845  TempSrc:   PainSc: Asleep                 [name]

## 2023-04-06 NOTE — ED Triage Notes (Signed)
  Patient comes left flank pain from suspected kidney stone.  Patient states she gets them often.  Endorses nausea with no emesis.  Took flomax, oxycodone, with minimal relief.  Pain 9/10, sharp.

## 2023-04-06 NOTE — Consult Note (Signed)
Urology Consult Note   Requesting Attending Physician:  Derwood Kaplan, MD Service Providing Consult: Urology  Consulting Attending: Dr. Alvester Morin   Reason for Consult:  ureteral stone  HPI: Michele Hall is seen in consultation for reasons noted above at the request of Derwood Kaplan, MD.  ------------------ Assessment:  57 y.o. female with left ureteral stones  Recommendations: #Ureteral stone  6mm obstructing distal stone and 5 mm nonobstructing stone both in left ureter. Trend labs.  Serum creatinine around 0.7, elevated to 1.46 today.  Encourage hydration. Urinalysis unremarkable, normothermic, no symptoms of systemic infectious process. Patient per her own admission, has low oral intake and is probably chronically dehydrated.  She was on spironolactone previously, was noted to be tachycardic, and is slated to be started on metoprolol.  On assessment today she is in normal sinus rhythm, slightly high to be at rest but still within normal range. Blood pressure within range.  I think it is most likely that her tachycardia is secondary to dehydration.  This is also likely to accelerate her stone development.  Once she has recovered and renal function has returned to baseline, would consider ACE/ARB before starting metoprolol or future diuretics.  To the OR for cystoscopy, left retrograde pyelogram, and left ureteral stent placement.  Tentatively around 5 PM.  Case and plan discussed with Dr. Alvester Morin, Pt, RN, ED attending  Past Medical History: Past Medical History:  Diagnosis Date   Anal fissure - posterior 2015   Complication of anesthesia    Pt states she heard her "oxygen dropped during procedure"   Gallstones    GERD (gastroesophageal reflux disease)    History of kidney stones    Hypertension    Morbid obesity (HCC)     Past Surgical History:  Past Surgical History:  Procedure Laterality Date   CHOLECYSTECTOMY  1988   CYSTOSCOPY WITH RETROGRADE PYELOGRAM, URETEROSCOPY  AND STENT PLACEMENT Left 04/18/2020   Procedure: CYSTOSCOPY WITH RETROGRADE PYELOGRAM, URETEROSCOPY , STONE EXTRACTION AND STENT PLACEMENT;  Surgeon: Marcine Matar, MD;  Location: [facility];  Service: Urology;  Laterality: Left;  75 MINS   CYSTOSCOPY WITH RETROGRADE PYELOGRAM, URETEROSCOPY AND STENT PLACEMENT Bilateral 01/29/2022   Procedure: CYSTOSCOPY WITH RETROGRADE PYELOGRAM, URETEROSCOPY, STONE EXTRACTION  AND STENT PLACEMENT holmium laser;  Surgeon: Marcine Matar, MD;  Location: [facility];  Service: Urology;  Laterality: Bilateral;   CYSTOSCOPY WITH STENT PLACEMENT Left 01/22/2022   Procedure: CYSTOSCOPY WITH STENT PLACEMENT;  Surgeon: Belva Agee, MD;  Location: [facility];  Service: Urology;  Laterality: Left;   CYSTOSCOPY WITH URETEROSCOPY, STONE BASKETRY AND STENT PLACEMENT Left 10/14/2022   Procedure: CYSTOSCOPY WITH LEFT URETEROSCOPY, STONE BASKETRY AND LEFTSTENT PLACEMENT,RETROGRADE;  Surgeon: Milderd Meager., MD;  Location: [facility];  Service: Urology;  Laterality: Left;   CYSTOSCOPY/URETEROSCOPY/HOLMIUM LASER/STENT PLACEMENT Left 07/24/2021   Procedure: CYSTOSCOPY/URETEROSCOPY/HOLMIUM LASER/STENT PLACEMENT;  Surgeon: Marcine Matar, MD;  Location: [facility];  Service: Urology;  Laterality: Left;   DILITATION & CURRETTAGE/HYSTROSCOPY WITH HYDROTHERMAL ABLATION N/A 09/13/2012   Procedure: DILATATION & CURETTAGE/HYSTEROSCOPY WITH HYDROTHERMAL ABLATION;  Surgeon: Tereso Newcomer, MD;  Location: [facility];  Service: Gynecology;  Laterality: N/A;   HOLMIUM LASER APPLICATION Left 04/18/2020   Procedure: HOLMIUM LASER LITHOTRIPSY;  Surgeon: Marcine Matar, MD;  Location: [facility];  Service: Urology;  Laterality: Left;   HOLMIUM LASER APPLICATION Right 07/24/2021   Procedure: HOLMIUM LASER APPLICATION;  Surgeon: Marcine Matar, MD;  Location: [facility];  Service: Urology;  Laterality: Right;   HOLMIUM LASER APPLICATION  Bilateral 01/29/2022   Procedure: HOLMIUM LASER APPLICATION;  Surgeon:  Marcine Matar, MD;  Location: [facility];  Service: Urology;  Laterality: Bilateral;   HOLMIUM LASER APPLICATION  10/14/2022   Procedure: HOLMIUM LASER APPLICATION;  Surgeon: Milderd Meager., MD;  Location: [facility];  Service: Urology;;   IR URETERAL STENT RIGHT NEW ACCESS W/O SEP NEPHROSTOMY CATH  07/24/2021   IR US GUIDANCE  07/24/2021   NEPHROLITHOTOMY Right 07/24/2021   Procedure: NEPHROLITHOTOMY PERCUTANEOUS;  Surgeon: Marcine Matar, MD;  Location: [facility];  Service: Urology;  Laterality: Right;   TUBAL LIGATION  1990    Medication: Current Facility-Administered Medications  Medication Dose Route Frequency Provider Last Rate Last Admin   HYDROmorphone (DILAUDID) injection 0.5 mg  0.5 mg Intravenous Q2H PRN Cardama, Amadeo Garnet, MD   0.5 mg at 04/06/23 1035   Current Outpatient Medications  Medication Sig Dispense Refill   cyanocobalamin (VITAMIN B12) 1000 MCG tablet Take 1,000 mcg by mouth 3 (three) times a week.     metoprolol succinate (TOPROL-XL) 25 MG 24 hr tablet Take 1 tablet (25 mg total) by mouth daily. 30 tablet 2   spironolactone (ALDACTONE) 50 MG tablet Take 1 tablet (50 mg total) by mouth daily. 90 tablet 1   tamsulosin (FLOMAX) 0.4 MG CAPS capsule Take 1 capsule (0.4 mg total) by mouth daily. 14 capsule 1    Allergies: Allergies  Allergen Reactions   Fentanyl Other (See Comments)    "Felt like I was going to have a heart attack"    Requip [Ropinirole Hcl] Other (See Comments)    Headache Feels "sick"    Social History: Social History   Tobacco Use   Smoking status: Former    Current packs/day: 0.00    Average packs/day: 0.3 packs/day for 5.0 years (1.3 ttl pk-yrs)    Types: Cigarettes    Start date: 02/09/1997    Quit date: 02/09/2002    Years since quitting: 21.1   Smokeless tobacco: Never  Vaping Use   Vaping status: Never Used  Substance Use Topics   Alcohol use: No   Drug use: No    Family History Family History  Problem Relation Age of  Onset   Stroke Mother    Kidney disease Father    Coronary artery disease Father    Aneurysm Father        AAA   Hypertension Father    Seizures Sister    Heart attack Maternal Grandfather    Cancer Sister        ovarian/uterus   Tongue cancer Sister    Colon cancer Neg Hx    Stomach cancer Neg Hx    Urticaria Neg Hx    Immunodeficiency Neg Hx    Eczema Neg Hx    Atopy Neg Hx    Asthma Neg Hx    Angioedema Neg Hx    Allergic rhinitis Neg Hx     Review of Systems  Genitourinary:  Positive for flank pain.     Objective   Vital signs in last 24 hours: BP 125/81   Pulse 92   Temp 97.7 F (36.5 C) (Oral)   Resp (!) 25   Ht 5\' 6"  (1.676 m)   Wt 121.6 kg   SpO2 96%   BMI 43.26 kg/m   Physical Exam General: NAD, A&O, resting, appropriate HEENT: [place]/AT Pulmonary: Normal work of breathing Cardiovascular: RRR, no cyanosis Abdomen: Soft, NTTP, nondistended MSK: No CVA tenderness Neuro: Appropriate, no focal neurological deficits  Most Recent Labs: Lab Results  Component Value Date   WBC 10.6 (H) 04/06/2023   HGB 14.7 04/06/2023   HCT 42.5 04/06/2023   PLT 211 04/06/2023    Lab Results  Component Value Date   NA 135 04/06/2023   K 3.9 04/06/2023   CL 103 04/06/2023   CO2 21 (L) 04/06/2023   BUN 27 (H) 04/06/2023   CREATININE 1.46 (H) 04/06/2023   CALCIUM 8.7 (L) 04/06/2023   MG 1.8 03/02/2017   PHOS 3.1 08/05/2007    Lab Results  Component Value Date   INR 1.0 07/24/2021     Urine Culture: @[id number] (laburin,org,[id number],[id number])@   IMAGING: CT Renal Stone Study  Result Date: 04/06/2023 CLINICAL DATA:  Left flank pain from suspected kidney stone EXAM: CT ABDOMEN AND PELVIS WITHOUT CONTRAST TECHNIQUE: Multidetector CT imaging of the abdomen and pelvis was performed following the standard protocol without IV contrast. RADIATION DOSE REDUCTION: This exam was performed according to the departmental dose-optimization program which includes automated  exposure control, adjustment of the mA and/or kV according to patient size and/or use of iterative reconstruction technique. COMPARISON:  12/04/2022 abdominal ultrasound. FINDINGS: Lower chest:  No contributory findings. Hepatobiliary: Hepatic steatosis.Cholecystectomy. Pancreas: Unremarkable. Spleen: Unremarkable. Adrenals/Urinary Tract: Negative adrenals. Marked left hydroureteronephrosis related to a distal ureteral stone measuring 6 mm on coronal reformats. Just proximal is a smaller and more faint calcification. In the proximal ureter is a 5 mm stone that is nonobstructive and layering. Tiny calculi scattered along the collecting system of the left kidney. Numerous right renal calculi measuring up to 3-4 mm and unchanged from prior. Left perinephric stranding, presumably obstructive. Unremarkable bladder. Stomach/Bowel: No obstruction. No appendicitis or other visible bowel inflammation. Vascular/Lymphatic: No acute vascular abnormality. No mass or adenopathy. Reproductive:No pathologic findings. Other: No ascites or pneumoperitoneum. Musculoskeletal: No acute abnormalities. IMPRESSION: 1. Obstructing 6 mm stone in the distal left ureter. Nonobstructing 5 mm stone in the upper left ureter. 2. Numerous bilateral renal calculi. 3. Hepatic steatosis. Electronically Signed   By: Tiburcio Pea M.D.   On: 04/06/2023 07:35    ------  Elmon Kirschner, NP Pager: [phone]   Please contact the urology consult pager with any further questions/concerns.

## 2023-04-06 NOTE — ED Provider Notes (Signed)
[place] EMERGENCY DEPARTMENT AT [facility] Provider Note  CSN: [id number] Arrival date & time: 04/06/23 4098  Chief Complaint(s) Nephrolithiasis  HPI Michele Hall is a 57 y.o. female    The history is provided by the patient.  Flank Pain This is a recurrent problem. The current episode started 6 to 12 hours ago. The problem occurs constantly. The problem has been gradually worsening. Pertinent negatives include no chest pain, no headaches and no shortness of breath. Nothing aggravates the symptoms. Nothing relieves the symptoms.   Took oxy and flomax w/o relief.  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 Authorizing Provider  cyanocobalamin (VITAMIN B12) 1000 MCG tablet Take 1,000 mcg by mouth 3 (three) times a week.    [provider]  metoprolol succinate (TOPROL-XL) 25 MG 24 hr tablet Take 1 tablet (25  mg total) by mouth daily. 03/27/23   Copland, Karleen Hampshire, MD  spironolactone (ALDACTONE) 50 MG tablet Take 1 tablet (50 mg total) by mouth daily. 12/24/22   Bedsole, Amy E, MD  tamsulosin (FLOMAX) 0.4 MG CAPS capsule Take 1 capsule (0.4 mg total) by mouth daily. 10/14/22   Stoneking, Danford Bad., MD                                                                                                                                    Allergies Fentanyl and Requip [ropinirole hcl]  Review of Systems Review of Systems  Respiratory:  Negative for shortness of breath.   Cardiovascular:  Negative for chest pain.  Genitourinary:  Positive for flank pain.  Neurological:  Negative for headaches.   As noted in HPI  Physical Exam Vital Signs  I have reviewed the triage vital signs BP (!) 145/83   Pulse 99   Temp 97.6 F (36.4 C) (Oral)   Resp  17   Ht 5\' 6"  (1.676 m)   Wt 121.6 kg   SpO2 96%   BMI 43.26 kg/m   Physical Exam Vitals reviewed.  Constitutional:      General: She is not in acute distress.    Appearance: She is well-developed. She is obese. She is not diaphoretic.  HENT:     Head: Normocephalic and atraumatic.     Right Ear: External ear normal.     Left Ear: External ear normal.     Nose: Nose normal.  Eyes:     General: No scleral icterus.    Conjunctiva/sclera: Conjunctivae normal.  Neck:     Trachea: Phonation normal.  Cardiovascular:     Rate and Rhythm: Normal rate and regular rhythm.  Pulmonary:     Effort: Pulmonary effort is normal. No respiratory distress.     Breath sounds: No stridor.  Abdominal:     General: There is no distension.     Tenderness: There is no abdominal tenderness.  Musculoskeletal:        General: Normal range of motion.     Cervical back: Normal range of motion.  Neurological:     Mental Status: She is alert and oriented to person, place, and time.  Psychiatric:        Behavior: Behavior normal.     ED Results and Treatments Labs (all  labs ordered are listed, but only abnormal results are displayed) Labs Reviewed  URINALYSIS, ROUTINE W REFLEX MICROSCOPIC - Abnormal; Notable for the following components:      Result Value   APPearance HAZY (*)    Hgb urine dipstick SMALL (*)    Leukocytes,Ua LARGE (*)    Bacteria, UA RARE (*)    All other components within normal limits  CBC WITH DIFFERENTIAL/PLATELET - Abnormal; Notable for the following components:   WBC 10.6 (*)    Monocytes Absolute 1.2 (*)    All other components within normal limits  COMPREHENSIVE METABOLIC PANEL - Abnormal; Notable for the following components:   CO2 21 (*)    Glucose, Bld 114 (*)    BUN 27 (*)    Creatinine, Ser 1.46 (*)    Calcium 8.7 (*)    GFR, Estimated 42 (*)    All other components within normal limits                                                                                                                         EKG  EKG Interpretation Date/Time:    Ventricular Rate:    PR Interval:    QRS Duration:    QT Interval:    QTC Calculation:   R Axis:      Text Interpretation:         Radiology CT Renal Stone Study  Result Date: 04/06/2023 CLINICAL DATA:  Left flank pain from suspected kidney stone EXAM: CT ABDOMEN AND PELVIS WITHOUT CONTRAST TECHNIQUE: Multidetector CT imaging of the abdomen and pelvis was  performed following the standard protocol without IV contrast. RADIATION DOSE REDUCTION: This exam was performed according to the departmental dose-optimization program which includes automated exposure control, adjustment of the mA and/or kV according to patient size and/or use of iterative reconstruction technique. COMPARISON:  12/04/2022 abdominal ultrasound. FINDINGS: Lower chest:  No contributory findings. Hepatobiliary: Hepatic steatosis.Cholecystectomy. Pancreas: Unremarkable. Spleen: Unremarkable. Adrenals/Urinary Tract: Negative adrenals. Marked left hydroureteronephrosis related to a distal ureteral stone  measuring 6 mm on coronal reformats. Just proximal is a smaller and more faint calcification. In the proximal ureter is a 5 mm stone that is nonobstructive and layering. Tiny calculi scattered along the collecting system of the left kidney. Numerous right renal calculi measuring up to 3-4 mm and unchanged from prior. Left perinephric stranding, presumably obstructive. Unremarkable bladder. Stomach/Bowel: No obstruction. No appendicitis or other visible bowel inflammation. Vascular/Lymphatic: No acute vascular abnormality. No mass or adenopathy. Reproductive:No pathologic findings. Other: No ascites or pneumoperitoneum. Musculoskeletal: No acute abnormalities. IMPRESSION: 1. Obstructing 6 mm stone in the distal left ureter. Nonobstructing 5 mm stone in the upper left ureter. 2. Numerous bilateral renal calculi. 3. Hepatic steatosis. Electronically Signed   By: Tiburcio Pea M.D.   On: 04/06/2023 07:35    Medications Ordered in ED Medications  HYDROmorphone (DILAUDID) injection 0.5 mg (has no administration in time range)  ketorolac (TORADOL) 15 MG/ML injection 15 mg (15 mg Intravenous Given 04/06/23 0632)  ondansetron (ZOFRAN) injection 4 mg (4 mg Intravenous Given 04/06/23 0630)   Procedures Procedures  (including critical care time) Medical Decision Making / ED Course   Medical Decision Making Amount and/or Complexity of Data Reviewed Labs: ordered. Decision-making details documented in ED Course. Radiology: ordered and independent interpretation performed. Decision-making details documented in ED Course.  Risk Prescription drug management.    Flank pain consistent with renal colic. UA w/o infection Leukocytosis and AKI noted. CT confirmed obstructing 6mm obstructing distal left ureter stone and 5mm nonobstructing mid left ureter.  Pain improved with Toradol. Urology consult  Patient care turned over to oncoming provider. Patient case and results discussed in detail; please see  their note for further ED managment.        Final Clinical Impression(s) / ED Diagnoses Final diagnoses:  Ureterolithiasis    This chart was dictated using voice recognition software.  Despite best efforts to proofread,  errors can occur which can change the documentation meaning.    Nira Conn, MD 04/06/23 [phone]

## 2023-04-06 NOTE — Discharge Instructions (Signed)

## 2023-04-06 NOTE — Anesthesia Preprocedure Evaluation (Signed)
Anesthesia Evaluation  Patient identified by MRN, date of birth, ID band Patient awake    Reviewed: Allergy & Precautions, H&P , NPO status , Patient's Chart, lab work & pertinent test results, reviewed documented beta blocker date and time   Airway Mallampati: II   Neck ROM: full    Dental   Pulmonary former smoker   Pulmonary exam normal breath sounds clear to auscultation       Cardiovascular hypertension, Pt. on home beta blockers Normal cardiovascular exam Rhythm:regular Rate:Normal     Neuro/Psych negative neurological ROS  negative psych ROS   GI/Hepatic Neg liver ROS,GERD  ,,  Endo/Other    Morbid obesity  Renal/GU Renal diseasestones  negative genitourinary   Musculoskeletal negative musculoskeletal ROS (+)    Abdominal  (+) + obese  Peds  Hematology negative hematology ROS (+)   Anesthesia Other Findings   Reproductive/Obstetrics                             Anesthesia Physical Anesthesia Plan  ASA: 3  Anesthesia Plan: General   Post-op Pain Management: Minimal or no pain anticipated   Induction: Intravenous  PONV Risk Score and Plan: 3 and Ondansetron and Dexamethasone  Airway Management Planned: LMA  Additional Equipment: None  Intra-op Plan:   Post-operative Plan: Extubation in OR  Informed Consent: I have reviewed the patients History and Physical, chart, labs and discussed the procedure including the risks, benefits and alternatives for the proposed anesthesia with the patient or authorized representative who has indicated his/her understanding and acceptance.     Dental advisory given  Plan Discussed with: CRNA  Anesthesia Plan Comments:         Anesthesia Quick Evaluation

## 2023-04-06 NOTE — Op Note (Signed)
Operative Note  Preoperative diagnosis:  1.  Left ureteral calculus  Post operative diagnosis: 1.  Left ureteral calculus  Procedure(s): 1.  Cystoscopy with left retrograde pyelogram and left ureteral stent placement  Surgeon: Modena Slater, MD  Assistants: None  Anesthesia: General  Complications: None immediate  EBL: Minimal  Specimens: 1.  None  Drains/Catheters: 1.  6 X 26 double-J ureteral stent  Intraoperative findings: 1.  Normal urethra and bladder 2.  Left retrograde pyelogram revealed a filling defect at the level of the stone with upstream hydroureteronephrosis  Indication: 57 year old female with a obstructing left ureteral calculus presents for urgent ureteral stent placement.  Description of procedure:  The patient was identified and consent was obtained.  The patient was taken to the operating room and placed in the supine position.  The patient was placed under general anesthesia.  Perioperative antibiotics were administered.  The patient was placed in dorsal lithotomy.  Patient was prepped and draped in a standard sterile fashion and a timeout was performed.  A 21 French rigid cystoscope was advanced into the urethra and into the bladder.  The left distal most portion of the ureter was cannulated with an open-ended ureteral catheter.  Retrograde pyelogram was performed with the findings noted above.  A sensor wire was then advanced up to the kidney under fluoroscopic guidance.  A 6 X 26 double-J ureteral stent was advanced up to the kidney under fluoroscopic guidance.  The wire was withdrawn and fluoroscopy confirmed good proximal placement and direct visualization confirmed a good coil within the bladder.  The bladder was drained and the scope withdrawn.  This concluded the operation.  Patient tolerated procedure well and was stable postoperatively.  Plan: Plan for follow-up ureteroscopy in 1 to 2 weeks.

## 2023-04-06 NOTE — ED Notes (Signed)
Ice chips given

## 2023-04-06 NOTE — ED Notes (Signed)
Pt rounding. Pt has no needs at this time. Family @bedside . Pt resting comfortably in bed. Call light within reach. Bed locked and in lowest position. Vital signs WNL.

## 2023-04-07 ENCOUNTER — Encounter (HOSPITAL_COMMUNITY): Payer: Self-pay | Admitting: Urology

## 2023-04-12 ENCOUNTER — Telehealth: Payer: Self-pay | Admitting: Urology

## 2023-04-12 NOTE — Telephone Encounter (Signed)
PATIENT WAS SEEN IN THE ER AND HAD CYSTO AND STENT PLACED ON 10/29 BY DR BELL. PT WANTS TO KNOW IF SHE SHOULD COME HERE OR GO TO DR BELL. ALSO WANTS TO KNOW ABOUT REMOVING IT OR IF SHE NEEDS TO LEAVE IT ALONE

## 2023-04-12 NOTE — Telephone Encounter (Signed)
Patient notified to schedule follow up with Dr Alvester Morin, patient expressed understanding.

## 2023-04-20 ENCOUNTER — Ambulatory Visit: Payer: BC Managed Care – PPO | Admitting: Family Medicine

## 2023-04-20 ENCOUNTER — Encounter: Payer: Self-pay | Admitting: Family Medicine

## 2023-04-20 VITALS — BP 112/80 | HR 99 | Temp 98.4°F | Ht 66.25 in | Wt 268.1 lb

## 2023-04-20 DIAGNOSIS — R109 Unspecified abdominal pain: Secondary | ICD-10-CM

## 2023-04-20 DIAGNOSIS — N201 Calculus of ureter: Secondary | ICD-10-CM

## 2023-04-20 DIAGNOSIS — R000 Tachycardia, unspecified: Secondary | ICD-10-CM | POA: Diagnosis not present

## 2023-04-20 DIAGNOSIS — I10 Essential (primary) hypertension: Secondary | ICD-10-CM | POA: Diagnosis not present

## 2023-04-20 LAB — POC URINALSYSI DIPSTICK (AUTOMATED)
Bilirubin, UA: NEGATIVE
Glucose, UA: NEGATIVE
Ketones, UA: NEGATIVE
Nitrite, UA: NEGATIVE
Protein, UA: POSITIVE — AB
Spec Grav, UA: 1.015 (ref 1.010–1.025)
Urobilinogen, UA: 0.2 U/dL
pH, UA: 6 (ref 5.0–8.0)

## 2023-04-20 NOTE — Progress Notes (Signed)
Patient ID: Michele Hall, female    DOB: [birth year], 57 y.o.   MRN: [id number]  This visit was conducted in person.  BP 112/80 (BP Location: Left Arm, Patient Position: Sitting, Cuff Size: Large)   Pulse 99   Temp 98.4 F (36.9 C) (Temporal)   Ht 5' 6.25" (1.683 m)   Wt 268 lb 2 oz (121.6 kg)   SpO2 98%   BMI 42.95 kg/m    CC:  Chief Complaint  Patient presents with   Blood Pressure Check   Flank Pain    Right Recently had stint placed in left kidney but not having pain on right side    Subjective:   HPI: Michele Hall is a 57 y.o. female presenting on 04/20/2023 for Blood Pressure Check and Flank Pain (Right/Recently had stint placed in left kidney but not having pain on right side)   Follow up made By Dr. Patsy Lager given tachycardia EKG: Normal sinus rhythm. Rate 114.  Normal axis, normal R wave progression, No acute ST elevation or depression.     She has been drinking lots of water 32 oz a day.  Avoiding caffeine.   Has been present  over last 2 years. Told to stop spironolactone and change to metoprolol XL 25 mg daily... she has not been taking either.  BP at home  132-146/73-97   She does not have CP, no SOB, no heart racing, rare dizziness.   She has been having some flank pain on right.  Hx of many stones, stents... scheduled to remove stent on Nov 22 She is in pain.      Relevant past medical, surgical, family and social history reviewed and updated as indicated. Interim medical history since our last visit reviewed. Allergies and medications reviewed and updated. Outpatient Medications Prior to Visit  Medication Sig Dispense Refill   HYDROcodone-acetaminophen (NORCO) 5-325 MG tablet Take 1 tablet by mouth every 4 (four) hours as needed. 12 tablet 0   indapamide (LOZOL) 2.5 MG tablet Take 2.5 mg by mouth daily. (Patient not taking: Reported on 04/06/2023)     metoprolol succinate (TOPROL-XL) 25 MG 24 hr tablet Take 1 tablet (25 mg total) by mouth daily.  (Patient not taking: Reported on 05/11/2023) 30 tablet 2   Oxycodone HCl 10 MG TABS Take 5 mg by mouth as needed (pain).     spironolactone (ALDACTONE) 50 MG tablet Take 1 tablet (50 mg total) by mouth daily. (Patient not taking: Reported on 04/20/2023) 90 tablet 1   tamsulosin (FLOMAX) 0.4 MG CAPS capsule Take 1 capsule (0.4 mg total) by mouth daily. (Patient not taking: Reported on 04/20/2023) 14 capsule 1   No facility-administered medications prior to visit.     Per HPI unless specifically indicated in ROS section below Review of Systems  Constitutional:  Negative for fatigue and fever.  HENT:  Negative for congestion.   Eyes:  Negative for pain.  Respiratory:  Negative for cough and shortness of breath.   Cardiovascular:  Negative for chest pain, palpitations and leg swelling.  Gastrointestinal:  Negative for abdominal pain.  Genitourinary:  Negative for dysuria and vaginal bleeding.  Musculoskeletal:  Negative for back pain.  Neurological:  Negative for syncope, light-headedness and headaches.  Psychiatric/Behavioral:  Negative for dysphoric mood.    Objective:  BP 112/80 (BP Location: Left Arm, Patient Position: Sitting, Cuff Size: Large)   Pulse 99   Temp 98.4 F (36.9 C) (Temporal)   Ht 5' 6.25" (1.683 m)  Wt 268 lb 2 oz (121.6 kg)   SpO2 98%   BMI 42.95 kg/m   Wt Readings from Last 3 Encounters:  05/11/23 271 lb 2 oz (123 kg)  04/28/23 268 lb 9.6 oz (121.8 kg)  04/20/23 268 lb 2 oz (121.6 kg)      Physical Exam Constitutional:      General: She is not in acute distress.    Appearance: Normal appearance. She is well-developed. She is not ill-appearing or toxic-appearing.  HENT:     Head: Normocephalic.     Right Ear: Hearing, tympanic membrane, ear canal and external ear normal. Tympanic membrane is not erythematous, retracted or bulging.     Left Ear: Hearing, tympanic membrane, ear canal and external ear normal. Tympanic membrane is not erythematous, retracted  or bulging.     Nose: No mucosal edema or rhinorrhea.     Right Sinus: No maxillary sinus tenderness or frontal sinus tenderness.     Left Sinus: No maxillary sinus tenderness or frontal sinus tenderness.     Mouth/Throat:     Mouth: Oropharynx is clear and moist and mucous membranes are normal.     Pharynx: Uvula midline.  Eyes:     General: Lids are normal. Lids are everted, no foreign bodies appreciated.     Extraocular Movements: EOM normal.     Conjunctiva/sclera: Conjunctivae normal.     Pupils: Pupils are equal, round, and reactive to light.  Neck:     Thyroid: No thyroid mass or thyromegaly.     Vascular: No carotid bruit.     Trachea: Trachea normal.  Cardiovascular:     Rate and Rhythm: Regular rhythm. Tachycardia present.     Pulses: Normal pulses.     Heart sounds: Normal heart sounds, S1 normal and S2 normal. No murmur heard.    No friction rub. No gallop.  Pulmonary:     Effort: Pulmonary effort is normal. No tachypnea or respiratory distress.     Breath sounds: Normal breath sounds. No decreased breath sounds, wheezing, rhonchi or rales.  Abdominal:     General: Bowel sounds are normal.     Palpations: Abdomen is soft.     Tenderness: There is no abdominal tenderness.  Musculoskeletal:     Cervical back: Normal range of motion and neck supple.  Skin:    General: Skin is warm, dry and intact.     Findings: No rash.  Neurological:     Mental Status: She is alert.  Psychiatric:        Mood and Affect: Mood is not anxious or depressed.        Speech: Speech normal.        Behavior: Behavior normal. Behavior is cooperative.        Thought Content: Thought content normal.        Cognition and Memory: Cognition and memory normal.        Judgment: Judgment normal.       Results for orders placed or performed in visit on 04/20/23  Urine Culture   Specimen: Urine  Result Value Ref Range   MICRO NUMBER: [id number]    SPECIMEN QUALITY: Adequate    Sample Source  URINE    STATUS: FINAL    Result: No Growth   POCT Urinalysis Dipstick (Automated)  Result Value Ref Range   Color, UA Red    Clarity, UA Cloudy    Glucose, UA Negative Negative   Bilirubin, UA Negative    Ketones, UA  Negative    Spec Grav, UA 1.015 1.010 - 1.025   Blood, UA Large (3+)    pH, UA 6.0 5.0 - 8.0   Protein, UA Positive (A) Negative   Urobilinogen, UA 0.2 0.2 or 1.0 E.U./dL   Nitrite, UA Negative    Leukocytes, UA Moderate (2+) (A) Negative    Assessment and Plan  Right flank pain Assessment & Plan: Acute, increasing pain in setting of known nephrolithiasis and stent. Will evaluate with urinalysis and culture to rule out new infection.  She has scheduled appointment for removal of stent and stones with urology next week.  Orders: -     POCT Urinalysis Dipstick (Automated) -     Urine Culture  Ureterolithiasis  Tachycardia Assessment & Plan:  Encouraged patient to start metoprolol XL 25 mg p.o. daily both for supraventricular tachycardia and blood pressure control.  Will follow-up in 2 weeks.   Essential hypertension, benign Assessment & Plan: Chronic, inadequate control Off spironolactone  but not has not yet made change to metoprolol XL 25 mg p.o. daily.  Encouraged patient to start metoprolol XL 25 mg p.o. daily both for supraventricular tachycardia and blood pressure control.  Will follow-up in 2 weeks.     Return in about 4 weeks (around 05/18/2023) for  follow up tachycardia.   Kerby Nora, MD

## 2023-04-21 ENCOUNTER — Encounter: Payer: Self-pay | Admitting: Family Medicine

## 2023-04-21 LAB — URINE CULTURE
MICRO NUMBER:: 15719000
Result:: NO GROWTH
SPECIMEN QUALITY:: ADEQUATE

## 2023-04-27 ENCOUNTER — Telehealth: Payer: Self-pay | Admitting: Family Medicine

## 2023-04-27 NOTE — Telephone Encounter (Signed)
Please triage

## 2023-04-27 NOTE — Telephone Encounter (Signed)
Noted.. will see pt 11/20

## 2023-04-27 NOTE — Telephone Encounter (Signed)
Patent is requesting a call back when able, states she has an upcoming procedure and had a question in regards to symptoms she is having.

## 2023-04-27 NOTE — Telephone Encounter (Signed)
I spoke with pt; pt said for the last 3 days pt is having watery diarrhea; no blood seen. Pt has tried taking probiotics but has not helped; pt has sharp pain on and off around belly button; last time had pain was earlier this morning.pt has felt nauseated but has not vomited. No fever to pts knowledge.pt started taking metoprolol on 04/21/23 and pt said that makes her sleepy. Pt wants to make sure can have stint and kidney stones removed on 04/30/23. Pt  scheduled appt to see Dr Ermalene Searing on 04/28/23 at 11:40. With UC ^ ED precautions. Sending note to Dr Ermalene Searing and [facility].

## 2023-04-28 ENCOUNTER — Ambulatory Visit: Payer: BC Managed Care – PPO | Admitting: Family Medicine

## 2023-04-28 ENCOUNTER — Encounter: Payer: Self-pay | Admitting: Family Medicine

## 2023-04-28 VITALS — BP 116/80 | HR 85 | Temp 98.3°F | Ht 66.25 in | Wt 268.6 lb

## 2023-04-28 DIAGNOSIS — R197 Diarrhea, unspecified: Secondary | ICD-10-CM | POA: Diagnosis not present

## 2023-04-28 NOTE — Assessment & Plan Note (Addendum)
Acute.. associated with abdominal pain but pt currently with stent in ureter and 2 stones on left.  Possible viral GE vs med SE.. encouraged hydration. Discussed avoidance of dehydration If not improving as expected with move forward with stool eval for Cdiff and Gi path panel.  Can tr probiotic.  Have low threshold for re-eval if symptoms worsening or changing, fever etc.  Keep appt for urology. Stent replacement and stone removal as planned in 2 days.

## 2023-04-28 NOTE — Progress Notes (Signed)
Patient ID: Michele Hall, female    DOB: [birth year], 57 y.o.   MRN: [id number]  This visit was conducted in person.  BP 116/80   Pulse 85   Temp 98.3 F (36.8 C) (Oral)   Ht 5' 6.25" (1.683 m)   Wt 268 lb 9.6 oz (121.8 kg)   SpO2 95%   BMI 43.03 kg/m    CC:  Chief Complaint  Patient presents with   Abdominal Pain    4 days, pain is located above the naval   Diarrhea    Subjective:   HPI: Michele Hall is a 57 y.o. female presenting on 04/28/2023 for Abdominal Pain (4 days, pain is located above the [employer]) and Diarrhea   New onset  diarrhea in last 4 days.  Followed by abdominal pain at belly button.. pain sharp, lasts  and intermittent.  Occ waking her at night.  No N/V.  No heartburn.  Trying eat less meat. No significant increase in fiber . No sick contacts.   No blood in stool.  Typically had constipation... has not had to treat recently.   Has blood in urine from kidney stnes, stent in place.  Has appt for stent removal, stone lithotripsy then replacement of stent.  10/29 CT abd negative except for nephrolithiasis   IMPRESSION: 1. Obstructing 6 mm stone in the distal left ureter. Nonobstructing 5 mm stone in the upper left ureter. 2. Numerous bilateral renal calculi. 3. Hepatic steatosis.    Relevant past medical, surgical, family and social history reviewed and updated as indicated. Interim medical history since our last visit reviewed. Allergies and medications reviewed and updated. Outpatient Medications Prior to Visit  Medication Sig Dispense Refill   metoprolol succinate (TOPROL-XL) 25 MG 24 hr tablet Take 1 tablet (25 mg total) by mouth daily. 30 tablet 2   HYDROcodone-acetaminophen (NORCO) 5-325 MG tablet Take 1 tablet by mouth every 4 (four) hours as needed. 12 tablet 0   indapamide (LOZOL) 2.5 MG tablet Take 2.5 mg by mouth daily. (Patient not taking: Reported on 04/06/2023)     spironolactone (ALDACTONE) 50 MG tablet Take 1 tablet (50 mg  total) by mouth daily. (Patient not taking: Reported on 04/20/2023) 90 tablet 1   tamsulosin (FLOMAX) 0.4 MG CAPS capsule Take 1 capsule (0.4 mg total) by mouth daily. (Patient not taking: Reported on 04/20/2023) 14 capsule 1   No facility-administered medications prior to visit.     Per HPI unless specifically indicated in ROS section below Review of Systems  Constitutional:  Negative for fatigue and fever.  HENT:  Negative for congestion.   Eyes:  Negative for pain.  Respiratory:  Negative for cough and shortness of breath.   Cardiovascular:  Negative for chest pain, palpitations and leg swelling.  Gastrointestinal:  Positive for abdominal pain and diarrhea.  Genitourinary:  Positive for hematuria. Negative for dysuria and vaginal bleeding.  Musculoskeletal:  Negative for back pain.  Neurological:  Negative for syncope, light-headedness and headaches.  Psychiatric/Behavioral:  Negative for dysphoric mood.    Objective:  BP 116/80   Pulse 85   Temp 98.3 F (36.8 C) (Oral)   Ht 5' 6.25" (1.683 m)   Wt 268 lb 9.6 oz (121.8 kg)   SpO2 95%   BMI 43.03 kg/m   Wt Readings from Last 3 Encounters:  04/28/23 268 lb 9.6 oz (121.8 kg)  04/20/23 268 lb 2 oz (121.6 kg)  04/06/23 268 lb (121.6 kg)  Physical Exam Constitutional:      General: She is not in acute distress.    Appearance: Normal appearance. She is well-developed. She is obese. She is not ill-appearing or toxic-appearing.  HENT:     Head: Normocephalic.     Right Ear: Hearing, tympanic membrane, ear canal and external ear normal. Tympanic membrane is not erythematous, retracted or bulging.     Left Ear: Hearing, tympanic membrane, ear canal and external ear normal. Tympanic membrane is not erythematous, retracted or bulging.     Nose: No mucosal edema or rhinorrhea.     Right Sinus: No maxillary sinus tenderness or frontal sinus tenderness.     Left Sinus: No maxillary sinus tenderness or frontal sinus tenderness.      Mouth/Throat:     Mouth: Oropharynx is clear and moist and mucous membranes are normal.     Pharynx: Uvula midline.  Eyes:     General: Lids are normal. Lids are everted, no foreign bodies appreciated.     Extraocular Movements: EOM normal.     Conjunctiva/sclera: Conjunctivae normal.     Pupils: Pupils are equal, round, and reactive to light.  Neck:     Thyroid: No thyroid mass or thyromegaly.     Vascular: No carotid bruit.     Trachea: Trachea normal.  Cardiovascular:     Rate and Rhythm: Normal rate and regular rhythm.     Pulses: Normal pulses.     Heart sounds: Normal heart sounds, S1 normal and S2 normal. No murmur heard.    No friction rub. No gallop.  Pulmonary:     Effort: Pulmonary effort is normal. No tachypnea or respiratory distress.     Breath sounds: Normal breath sounds. No decreased breath sounds, wheezing, rhonchi or rales.  Abdominal:     General: Bowel sounds are normal.     Palpations: Abdomen is soft.     Tenderness: There is generalized abdominal tenderness. There is left CVA tenderness. There is no right CVA tenderness, guarding or rebound. Negative signs include Murphy's sign, McBurney's sign and obturator sign.     Hernia: No hernia is present.  Musculoskeletal:     Cervical back: Normal range of motion and neck supple.  Skin:    General: Skin is warm, dry and intact.     Findings: No rash.  Neurological:     Mental Status: She is alert.  Psychiatric:        Mood and Affect: Mood is not anxious or depressed.        Speech: Speech normal.        Behavior: Behavior normal. Behavior is cooperative.        Thought Content: Thought content normal.        Cognition and Memory: Cognition and memory normal.        Judgment: Judgment normal.       Results for orders placed or performed in visit on 04/20/23  Urine Culture   Specimen: Urine  Result Value Ref Range   MICRO NUMBER: [id number]    SPECIMEN QUALITY: Adequate    Sample Source URINE    STATUS:  FINAL    Result: No Growth   POCT Urinalysis Dipstick (Automated)  Result Value Ref Range   Color, UA Red    Clarity, UA Cloudy    Glucose, UA Negative Negative   Bilirubin, UA Negative    Ketones, UA Negative    Spec Grav, UA 1.015 1.010 - 1.025   Blood, UA Large (  3+)    pH, UA 6.0 5.0 - 8.0   Protein, UA Positive (A) Negative   Urobilinogen, UA 0.2 0.2 or 1.0 E.U./dL   Nitrite, UA Negative    Leukocytes, UA Moderate (2+) (A) Negative    Assessment and Plan  There are no diagnoses linked to this encounter.  No follow-ups on file.   Kerby Nora, MD

## 2023-05-11 ENCOUNTER — Ambulatory Visit: Payer: BC Managed Care – PPO | Admitting: Family Medicine

## 2023-05-11 ENCOUNTER — Encounter: Payer: Self-pay | Admitting: Family Medicine

## 2023-05-11 VITALS — BP 100/76 | HR 94 | Temp 97.7°F | Ht 66.25 in | Wt 271.1 lb

## 2023-05-11 DIAGNOSIS — I10 Essential (primary) hypertension: Secondary | ICD-10-CM

## 2023-05-11 DIAGNOSIS — R000 Tachycardia, unspecified: Secondary | ICD-10-CM | POA: Diagnosis not present

## 2023-05-11 MED ORDER — AMLODIPINE BESYLATE 5 MG PO TABS: 5.0000 mg | ORAL_TABLET | Freq: Every day | ORAL | 11 refills | Status: DC

## 2023-05-11 NOTE — Assessment & Plan Note (Signed)
Ongoing for several years.  Patient asymptomatic. Given we are changing blood pressure medication we will choose a medication but will also slow heart rate

## 2023-05-11 NOTE — Assessment & Plan Note (Signed)
Chronic, she reports side effects to metoprolol.  She would not like to continue spironolactone as she would like to avoid diuretic given kidney stone history.  Will start amlodipine 5 mg p.o. daily.  She will follow blood pressure and heart rate at home and send update in the next 1 to 2 weeks.

## 2023-05-11 NOTE — Patient Instructions (Addendum)
Stop spironolactone and start amlodipine 5 mg daily.  Follow BP and HR at home.... send measurements in 2 weeks

## 2023-05-11 NOTE — Progress Notes (Signed)
Patient ID: Michele Hall, female    DOB: Jul 17, 1965, 57 y.o.   MRN: [id number]  This visit was conducted in person.  BP 100/76 (BP Location: Left Arm, Patient Position: Sitting, Cuff Size: Large)   Pulse 94   Temp 97.7 F (36.5 C) (Temporal)   Ht 5' 6.25" (1.683 m)   Wt 271 lb 2 oz (123 kg)   SpO2 99%   BMI 43.43 kg/m    CC:  Chief Complaint  Patient presents with   Tachycardia    Follow up    Subjective:   HPI: Michele Hall is a 57 y.o. female presenting on 05/11/2023 for Tachycardia (Follow up)   Recent stent and stone removal. Dr. Alvester Morin, Urology  Plan recheck [place] in 1 month.     SVT/tachycardia  Was given metoprolol XL 25 mg p.o. daily She has stopped this med as she felt cause her face to be red. Had controlled her heart rate while she  was on.Marland Kitchen HR 85  After surgery she started back on spironolactone. BP Readings from Last 3 Encounters:  05/11/23 100/76  04/28/23 116/80  04/20/23 112/80   Has been on hydrochlorothiazide, losartan  Relevant past medical, surgical, family and social history reviewed and updated as indicated. Interim medical history since our last visit reviewed. Allergies and medications reviewed and updated. Outpatient Medications Prior to Visit  Medication Sig Dispense Refill   spironolactone (ALDACTONE) 50 MG tablet Take 50 mg by mouth daily.     metoprolol succinate (TOPROL-XL) 25 MG 24 hr tablet Take 1 tablet (25 mg total) by mouth daily. (Patient not taking: Reported on 05/11/2023) 30 tablet 2   No facility-administered medications prior to visit.     Per HPI unless specifically indicated in ROS section below Review of Systems  Constitutional:  Negative for fatigue and fever.  HENT:  Negative for congestion.   Eyes:  Negative for pain.  Respiratory:  Negative for cough and shortness of breath.   Cardiovascular:  Negative for chest pain, palpitations and leg swelling.  Gastrointestinal:  Positive for diarrhea. Negative for abdominal  pain, nausea and vomiting.       Still some gurgling in stomach  Genitourinary:  Negative for dysuria and vaginal bleeding.  Musculoskeletal:  Negative for back pain.  Neurological:  Negative for syncope, light-headedness and headaches.  Psychiatric/Behavioral:  Negative for dysphoric mood.    Objective:  BP 100/76 (BP Location: Left Arm, Patient Position: Sitting, Cuff Size: Large)   Pulse 94   Temp 97.7 F (36.5 C) (Temporal)   Ht 5' 6.25" (1.683 m)   Wt 271 lb 2 oz (123 kg)   SpO2 99%   BMI 43.43 kg/m   Wt Readings from Last 3 Encounters:  05/11/23 271 lb 2 oz (123 kg)  04/28/23 268 lb 9.6 oz (121.8 kg)  04/20/23 268 lb 2 oz (121.6 kg)      Physical Exam Constitutional:      General: She is not in acute distress.    Appearance: Normal appearance. She is well-developed. She is not ill-appearing or toxic-appearing.  HENT:     Head: Normocephalic.     Right Ear: Hearing, tympanic membrane, ear canal and external ear normal. Tympanic membrane is not erythematous, retracted or bulging.     Left Ear: Hearing, tympanic membrane, ear canal and external ear normal. Tympanic membrane is not erythematous, retracted or bulging.     Nose: No mucosal edema or rhinorrhea.     Right Sinus:  No maxillary sinus tenderness or frontal sinus tenderness.     Left Sinus: No maxillary sinus tenderness or frontal sinus tenderness.     Mouth/Throat:     Mouth: Oropharynx is clear and moist and mucous membranes are normal.     Pharynx: Uvula midline.  Eyes:     General: Lids are normal. Lids are everted, no foreign bodies appreciated.     Extraocular Movements: EOM normal.     Conjunctiva/sclera: Conjunctivae normal.     Pupils: Pupils are equal, round, and reactive to light.  Neck:     Thyroid: No thyroid mass or thyromegaly.     Vascular: No carotid bruit.     Trachea: Trachea normal.  Cardiovascular:     Rate and Rhythm: Normal rate and regular rhythm.     Pulses: Normal pulses.     Heart  sounds: Normal heart sounds, S1 normal and S2 normal. No murmur heard.    No friction rub. No gallop.  Pulmonary:     Effort: Pulmonary effort is normal. No tachypnea or respiratory distress.     Breath sounds: Normal breath sounds. No decreased breath sounds, wheezing, rhonchi or rales.  Abdominal:     General: Bowel sounds are normal.     Palpations: Abdomen is soft.     Tenderness: There is no abdominal tenderness.  Musculoskeletal:     Cervical back: Normal range of motion and neck supple.  Skin:    General: Skin is warm, dry and intact.     Findings: No rash.  Neurological:     Mental Status: She is alert.  Psychiatric:        Mood and Affect: Mood is not anxious or depressed.        Speech: Speech normal.        Behavior: Behavior normal. Behavior is cooperative.        Thought Content: Thought content normal.        Cognition and Memory: Cognition and memory normal.        Judgment: Judgment normal.       Results for orders placed or performed in visit on 04/20/23  Urine Culture   Specimen: Urine  Result Value Ref Range   MICRO NUMBER: [id number]    SPECIMEN QUALITY: Adequate    Sample Source URINE    STATUS: FINAL    Result: No Growth   POCT Urinalysis Dipstick (Automated)  Result Value Ref Range   Color, UA Red    Clarity, UA Cloudy    Glucose, UA Negative Negative   Bilirubin, UA Negative    Ketones, UA Negative    Spec Grav, UA 1.015 1.010 - 1.025   Blood, UA Large (3+)    pH, UA 6.0 5.0 - 8.0   Protein, UA Positive (A) Negative   Urobilinogen, UA 0.2 0.2 or 1.0 E.U./dL   Nitrite, UA Negative    Leukocytes, UA Moderate (2+) (A) Negative    Assessment and Plan  Tachycardia Assessment & Plan: Ongoing for several years.  Patient asymptomatic. Given we are changing blood pressure medication we will choose a medication but will also slow heart rate   Essential hypertension, benign Assessment & Plan: Chronic, she reports side effects to metoprolol.  She  would not like to continue spironolactone as she would like to avoid diuretic given kidney stone history.  Will start amlodipine 5 mg p.o. daily.  She will follow blood pressure and heart rate at home and send update in the next 1  to 2 weeks.   Other orders -     amLODIPine Besylate; Take 1 tablet (5 mg total) by mouth daily.  Dispense: 30 tablet; Refill: 11    No follow-ups on file.   Kerby Nora, MD

## 2023-05-15 NOTE — Assessment & Plan Note (Signed)
Chronic, inadequate control Off spironolactone  but not has not yet made change to metoprolol XL 25 mg p.o. daily.  Encouraged patient to start metoprolol XL 25 mg p.o. daily both for supraventricular tachycardia and blood pressure control.  Will follow-up in 2 weeks.

## 2023-05-15 NOTE — Assessment & Plan Note (Signed)
  Encouraged patient to start metoprolol XL 25 mg p.o. daily both for supraventricular tachycardia and blood pressure control.  Will follow-up in 2 weeks.

## 2023-05-15 NOTE — Assessment & Plan Note (Signed)
Acute, increasing pain in setting of known nephrolithiasis and stent. Will evaluate with urinalysis and culture to rule out new infection.  She has scheduled appointment for removal of stent and stones with urology next week.

## 2023-06-19 ENCOUNTER — Other Ambulatory Visit: Payer: Self-pay | Admitting: Family Medicine

## 2023-06-25 ENCOUNTER — Ambulatory Visit (INDEPENDENT_AMBULATORY_CARE_PROVIDER_SITE_OTHER): Payer: 59 | Admitting: Family Medicine

## 2023-06-25 VITALS — BP 110/70 | HR 97 | Temp 97.8°F | Ht 66.25 in | Wt 275.1 lb

## 2023-06-25 DIAGNOSIS — H60501 Unspecified acute noninfective otitis externa, right ear: Secondary | ICD-10-CM | POA: Diagnosis not present

## 2023-06-25 DIAGNOSIS — S0991XA Unspecified injury of ear, initial encounter: Secondary | ICD-10-CM | POA: Diagnosis not present

## 2023-06-25 MED ORDER — SPIRONOLACTONE 50 MG PO TABS: 50.0000 mg | ORAL_TABLET | Freq: Every day | ORAL | 3 refills | Status: AC

## 2023-06-25 MED ORDER — NEOMYCIN-POLYMYXIN-HC 3.5-10000-1 OT SOLN: 3.0000 [drp] | Freq: Four times a day (QID) | OTIC | 0 refills | Status: DC

## 2023-06-25 NOTE — Progress Notes (Signed)
Patient ID: Michele Hall, female    DOB: [birth year], 58 y.o.   MRN: [id number]  This visit was conducted in person.  BP 110/70 (BP Location: Left Arm, Patient Position: Sitting, Cuff Size: Large)   Pulse 97   Temp 97.8 F (36.6 C) (Temporal)   Ht 5' 6.25" (1.683 m)   Wt 275 lb 2 oz (124.8 kg)   SpO2 97%   BMI 44.07 kg/m    CC:  Chief Complaint  Patient presents with   Ear Pain    Right-Jabbed pen in it last week    Subjective:   HPI: Michele Hall is a 57 y.o. female presenting on 06/25/2023 for Ear Pain (Right-Jabbed pen in it last week)  Yesterday she was putting a pen behind her right ear 1 week ago.Marland Kitchen accidentally poked ear canal.. had bleeding.  Cleaned with alcohol and q tip. She has continued to be sore, but yesterday ear felt  more sore in base of ear.   No ST, no fever.   Feeling well otherwise. No hearing change. No discharge.       Relevant past medical, surgical, family and social history reviewed and updated as indicated. Interim medical history since our last visit reviewed. Allergies and medications reviewed and updated. Outpatient Medications Prior to Visit  Medication Sig Dispense Refill   spironolactone (ALDACTONE) 50 MG tablet Take 50 mg by mouth daily.     amLODipine (NORVASC) 5 MG tablet Take 1 tablet (5 mg total) by mouth daily. (Patient not taking: Reported on 06/25/2023) 30 tablet 11   No facility-administered medications prior to visit.     Per HPI unless specifically indicated in ROS section below Review of Systems  Constitutional:  Negative for fatigue and fever.  HENT:  Positive for ear pain.   Eyes:  Negative for pain.  Respiratory:  Negative for chest tightness and shortness of breath.   Cardiovascular:  Negative for chest pain, palpitations and leg swelling.  Gastrointestinal:  Negative for abdominal pain.  Genitourinary:  Negative for dysuria.   Objective:  BP 110/70 (BP Location: Left Arm, Patient Position: Sitting, Cuff  Size: Large)   Pulse 97   Temp 97.8 F (36.6 C) (Temporal)   Ht 5' 6.25" (1.683 m)   Wt 275 lb 2 oz (124.8 kg)   SpO2 97%   BMI 44.07 kg/m   Wt Readings from Last 3 Encounters:  06/25/23 275 lb 2 oz (124.8 kg)  05/11/23 271 lb 2 oz (123 kg)  04/28/23 268 lb 9.6 oz (121.8 kg)      Physical Exam Constitutional:      General: She is not in acute distress.    Appearance: Normal appearance. She is well-developed. She is not ill-appearing or toxic-appearing.  HENT:     Head: Normocephalic.     Right Ear: Hearing, tympanic membrane and ear canal normal. Tympanic membrane is not erythematous, retracted or bulging.     Left Ear: Hearing, tympanic membrane, ear canal and external ear normal. Tympanic membrane is not erythematous, retracted or bulging.     Ears:     Comments:  Scab at base of ear canal, slight erythema, no swelling  Soreness with movement of pinna    Nose: No mucosal edema or rhinorrhea.     Right Sinus: No maxillary sinus tenderness or frontal sinus tenderness.     Left Sinus: No maxillary sinus tenderness or frontal sinus tenderness.     Mouth/Throat:     Pharynx: Uvula  midline.  Eyes:     General: Lids are normal. Lids are everted, no foreign bodies appreciated.     Conjunctiva/sclera: Conjunctivae normal.     Pupils: Pupils are equal, round, and reactive to light.  Neck:     Thyroid: No thyroid mass or thyromegaly.     Vascular: No carotid bruit.     Trachea: Trachea normal.  Cardiovascular:     Rate and Rhythm: Normal rate and regular rhythm.     Pulses: Normal pulses.     Heart sounds: Normal heart sounds, S1 normal and S2 normal. No murmur heard.    No friction rub. No gallop.  Pulmonary:     Effort: Pulmonary effort is normal. No tachypnea or respiratory distress.     Breath sounds: Normal breath sounds. No decreased breath sounds, wheezing, rhonchi or rales.  Abdominal:     General: Bowel sounds are normal.     Palpations: Abdomen is soft.      Tenderness: There is no abdominal tenderness.  Musculoskeletal:     Cervical back: Normal range of motion and neck supple.  Skin:    General: Skin is warm and dry.     Findings: No rash.  Neurological:     Mental Status: She is alert.  Psychiatric:        Mood and Affect: Mood is not anxious or depressed.        Speech: Speech normal.        Behavior: Behavior normal. Behavior is cooperative.        Thought Content: Thought content normal.        Judgment: Judgment normal.       Results for orders placed or performed in visit on 04/20/23  POCT Urinalysis Dipstick (Automated)   Collection Time: 04/20/23 11:40 AM  Result Value Ref Range   Color, UA Red    Clarity, UA Cloudy    Glucose, UA Negative Negative   Bilirubin, UA Negative    Ketones, UA Negative    Spec Grav, UA 1.015 1.010 - 1.025   Blood, UA Large (3+)    pH, UA 6.0 5.0 - 8.0   Protein, UA Positive (A) Negative   Urobilinogen, UA 0.2 0.2 or 1.0 E.U./dL   Nitrite, UA Negative    Leukocytes, UA Moderate (2+) (A) Negative  Urine Culture   Collection Time: 04/20/23 11:59 AM   Specimen: Urine  Result Value Ref Range   MICRO NUMBER: [id number]    SPECIMEN QUALITY: Adequate    Sample Source URINE    STATUS: FINAL    Result: No Growth     Assessment and Plan Healing injury to right ear canal, given pain increasing in last 24 hours, some current concern for possible early otitis externa.  Will treat with neomycin polymyxin HC 3 drops in right ear 4 times daily.  Can use Tylenol as needed for ear pain.  Return and ER precautions provided  Injury of ear canal, initial encounter  Acute otitis externa of right ear, unspecified type  Other orders -     Neomycin-Polymyxin-HC; Place 3 drops into the right ear 4 (four) times daily.  Dispense: 10 mL; Refill: 0 -     Spironolactone; Take 1 tablet (50 mg total) by mouth daily.  Dispense: 90 tablet; Refill: 3    No follow-ups on file.   Kerby Nora, MD

## 2023-06-30 ENCOUNTER — Other Ambulatory Visit: Payer: Self-pay | Admitting: Family Medicine

## 2023-07-02 ENCOUNTER — Other Ambulatory Visit: Payer: Self-pay | Admitting: Family Medicine

## 2023-07-02 MED ORDER — OSELTAMIVIR PHOSPHATE 75 MG PO CAPS: 75.0000 mg | ORAL_CAPSULE | Freq: Every day | ORAL | 0 refills | Status: DC

## 2023-07-02 NOTE — Progress Notes (Signed)
Patient's husband tested positive for Influenza A yesterday.  Rayvon asking for prophylactic prescription of Tamiflu.  Rx for Tamiflu 75 mg take 1 capsule daily x 10 days sent to [facility] [address] as instructed by Dr. Ermalene Searing.

## 2023-07-09 ENCOUNTER — Other Ambulatory Visit: Payer: Self-pay

## 2023-07-09 DIAGNOSIS — Z1231 Encounter for screening mammogram for malignant neoplasm of breast: Secondary | ICD-10-CM

## 2023-07-12 ENCOUNTER — Inpatient Hospital Stay: Admission: RE | Admit: 2023-07-12 | Payer: 59 | Source: Ambulatory Visit

## 2023-09-13 ENCOUNTER — Other Ambulatory Visit (HOSPITAL_COMMUNITY): Payer: Self-pay | Admitting: Urology

## 2023-09-13 DIAGNOSIS — N1330 Unspecified hydronephrosis: Secondary | ICD-10-CM

## 2023-09-17 ENCOUNTER — Ambulatory Visit
Admission: RE | Admit: 2023-09-17 | Discharge: 2023-09-17 | Disposition: A | Discharge status: Home / Self Care | Source: Ambulatory Visit | Attending: Family Medicine | Admitting: Family Medicine

## 2023-09-17 DIAGNOSIS — Z1231 Encounter for screening mammogram for malignant neoplasm of breast: Secondary | ICD-10-CM

## 2023-09-21 ENCOUNTER — Ambulatory Visit (HOSPITAL_COMMUNITY)
Admission: RE | Admit: 2023-09-21 | Discharge: 2023-09-21 | Disposition: A | Discharge status: Home / Self Care | Source: Ambulatory Visit | Attending: Urology | Admitting: Urology

## 2023-09-21 DIAGNOSIS — N1330 Unspecified hydronephrosis: Secondary | ICD-10-CM | POA: Diagnosis present

## 2023-09-21 MED ORDER — FUROSEMIDE 10 MG/ML IJ SOLN
INTRAMUSCULAR | Status: AC
  Filled 2023-09-21: qty 8

## 2023-09-21 MED ORDER — TECHNETIUM TC 99M MERTIATIDE
5.1000 | Freq: Once | INTRAVENOUS | Status: AC | PRN
  Administered 2023-09-21: 5.1 via INTRAVENOUS

## 2023-09-21 MED ORDER — FUROSEMIDE 10 MG/ML IJ SOLN
60.0000 mg | Freq: Once | INTRAMUSCULAR | Status: AC
Start: 2023-09-21 — End: 2023-09-21
  Administered 2023-09-21: 60 mg via INTRAVENOUS

## 2023-10-22 ENCOUNTER — Ambulatory Visit (HOSPITAL_COMMUNITY): Admitting: Anesthesiology

## 2023-10-22 ENCOUNTER — Ambulatory Visit (HOSPITAL_COMMUNITY)

## 2023-10-22 ENCOUNTER — Ambulatory Visit (HOSPITAL_COMMUNITY)
Admission: AD | Admit: 2023-10-22 | Discharge: 2023-10-22 | Disposition: A | Discharge status: Home / Self Care | Source: Ambulatory Visit | Attending: Urology | Admitting: Urology

## 2023-10-22 ENCOUNTER — Ambulatory Visit (HOSPITAL_BASED_OUTPATIENT_CLINIC_OR_DEPARTMENT_OTHER): Admitting: Anesthesiology

## 2023-10-22 ENCOUNTER — Encounter (HOSPITAL_COMMUNITY)
Admission: AD | Disposition: A | Discharge status: Home / Self Care | Payer: Self-pay | Source: Ambulatory Visit | Attending: Urology

## 2023-10-22 ENCOUNTER — Other Ambulatory Visit: Payer: Self-pay

## 2023-10-22 ENCOUNTER — Other Ambulatory Visit: Payer: Self-pay | Admitting: Urology

## 2023-10-22 ENCOUNTER — Encounter (HOSPITAL_COMMUNITY): Payer: Self-pay | Admitting: Urology

## 2023-10-22 DIAGNOSIS — Z6841 Body Mass Index (BMI) 40.0 and over, adult: Secondary | ICD-10-CM | POA: Diagnosis not present

## 2023-10-22 DIAGNOSIS — E6689 Other obesity not elsewhere classified: Secondary | ICD-10-CM | POA: Insufficient documentation

## 2023-10-22 DIAGNOSIS — N132 Hydronephrosis with renal and ureteral calculous obstruction: Secondary | ICD-10-CM

## 2023-10-22 DIAGNOSIS — E785 Hyperlipidemia, unspecified: Secondary | ICD-10-CM | POA: Diagnosis not present

## 2023-10-22 DIAGNOSIS — I10 Essential (primary) hypertension: Secondary | ICD-10-CM | POA: Diagnosis not present

## 2023-10-22 DIAGNOSIS — K219 Gastro-esophageal reflux disease without esophagitis: Secondary | ICD-10-CM | POA: Insufficient documentation

## 2023-10-22 DIAGNOSIS — Z87891 Personal history of nicotine dependence: Secondary | ICD-10-CM | POA: Diagnosis not present

## 2023-10-22 DIAGNOSIS — Z79899 Other long term (current) drug therapy: Secondary | ICD-10-CM | POA: Insufficient documentation

## 2023-10-22 HISTORY — PX: CYSTOSCOPY/URETEROSCOPY/HOLMIUM LASER/STENT PLACEMENT: SHX6546

## 2023-10-22 LAB — CBC
HCT: 43.2 % (ref 36.0–46.0)
Hemoglobin: 15.2 g/dL — ABNORMAL HIGH (ref 12.0–15.0)
MCH: 33.2 pg (ref 26.0–34.0)
MCHC: 35.2 g/dL (ref 30.0–36.0)
MCV: 94.3 fL (ref 80.0–100.0)
Platelets: 223 10*3/uL (ref 150–400)
RBC: 4.58 MIL/uL (ref 3.87–5.11)
RDW: 12.5 % (ref 11.5–15.5)
WBC: 9 10*3/uL (ref 4.0–10.5)
nRBC: 0 % (ref 0.0–0.2)

## 2023-10-22 LAB — BASIC METABOLIC PANEL WITH GFR
Anion gap: 12 (ref 5–15)
BUN: 13 mg/dL (ref 6–20)
CO2: 22 mmol/L (ref 22–32)
Calcium: 8.3 mg/dL — ABNORMAL LOW (ref 8.9–10.3)
Chloride: 103 mmol/L (ref 98–111)
Creatinine, Ser: 0.8 mg/dL (ref 0.44–1.00)
GFR, Estimated: >60 mL/min (ref >60)
Glucose, Bld: 92 mg/dL (ref 70–99)
Potassium: 3.3 mmol/L — ABNORMAL LOW (ref 3.5–5.1)
Sodium: 137 mmol/L (ref 135–145)

## 2023-10-22 SURGERY — CYSTOSCOPY/URETEROSCOPY/HOLMIUM LASER/STENT PLACEMENT
Anesthesia: General | Laterality: Left

## 2023-10-22 MED ORDER — LIDOCAINE HCL (CARDIAC) PF 100 MG/5ML IV SOSY
PREFILLED_SYRINGE | INTRAVENOUS | Status: DC | PRN
  Administered 2023-10-22: 50 mg via INTRAVENOUS

## 2023-10-22 MED ORDER — PROPOFOL 10 MG/ML IV BOLUS
INTRAVENOUS | Status: AC
  Filled 2023-10-22: qty 20

## 2023-10-22 MED ORDER — OXYCODONE HCL 5 MG PO TABS
5.0000 mg | ORAL_TABLET | Freq: Once | ORAL | Status: AC | PRN
  Administered 2023-10-22: 5 mg via ORAL

## 2023-10-22 MED ORDER — DEXAMETHASONE SODIUM PHOSPHATE 10 MG/ML IJ SOLN
INTRAMUSCULAR | Status: DC | PRN
Start: 2023-10-22 — End: 2023-10-22
  Administered 2023-10-22: 5 mg via INTRAVENOUS

## 2023-10-22 MED ORDER — OXYCODONE HCL 5 MG/5ML PO SOLN: 5.0000 mg | Freq: Once | ORAL | Status: AC | PRN

## 2023-10-22 MED ORDER — LIDOCAINE HCL (PF) 2 % IJ SOLN
INTRAMUSCULAR | Status: AC
  Filled 2023-10-22: qty 5

## 2023-10-22 MED ORDER — MIDAZOLAM HCL 5 MG/5ML IJ SOLN
INTRAMUSCULAR | Status: DC | PRN
  Administered 2023-10-22: 2 mg via INTRAVENOUS

## 2023-10-22 MED ORDER — HYDROMORPHONE HCL 1 MG/ML IJ SOLN
0.2500 mg | INTRAMUSCULAR | Status: DC | PRN
  Administered 2023-10-22 (×2): 0.5 mg via INTRAVENOUS

## 2023-10-22 MED ORDER — MIDAZOLAM HCL 2 MG/2ML IJ SOLN: 0.5000 mg | Freq: Once | INTRAMUSCULAR | Status: DC | PRN

## 2023-10-22 MED ORDER — SCOPOLAMINE 1 MG/3DAYS TD PT72
1.0000 | MEDICATED_PATCH | TRANSDERMAL | Status: DC
  Administered 2023-10-22: 1.5 mg via TRANSDERMAL
  Filled 2023-10-22: qty 1

## 2023-10-22 MED ORDER — HYOSCYAMINE SULFATE 0.125 MG SL SUBL: 0.1250 mg | SUBLINGUAL_TABLET | SUBLINGUAL | 0 refills | Status: DC | PRN

## 2023-10-22 MED ORDER — PROPOFOL 10 MG/ML IV BOLUS
INTRAVENOUS | Status: DC | PRN
  Administered 2023-10-22 (×2): 100 mg via INTRAVENOUS

## 2023-10-22 MED ORDER — ONDANSETRON HCL 4 MG/2ML IJ SOLN
INTRAMUSCULAR | Status: DC | PRN
  Administered 2023-10-22: 4 mg via INTRAVENOUS

## 2023-10-22 MED ORDER — PHENYLEPHRINE HCL (PRESSORS) 10 MG/ML IV SOLN
INTRAVENOUS | Status: DC | PRN
  Administered 2023-10-22 (×8): 80 ug via INTRAVENOUS

## 2023-10-22 MED ORDER — IOHEXOL 300 MG/ML  SOLN
INTRAMUSCULAR | Status: DC | PRN
  Administered 2023-10-22: 17 mL

## 2023-10-22 MED ORDER — FENTANYL CITRATE (PF) 100 MCG/2ML IJ SOLN
INTRAMUSCULAR | Status: DC | PRN
  Administered 2023-10-22 (×4): 50 ug via INTRAVENOUS

## 2023-10-22 MED ORDER — MIDAZOLAM HCL 2 MG/2ML IJ SOLN
INTRAMUSCULAR | Status: AC
  Filled 2023-10-22: qty 2

## 2023-10-22 MED ORDER — OXYCODONE HCL 5 MG PO TABS
ORAL_TABLET | ORAL | Status: AC
  Filled 2023-10-22: qty 1

## 2023-10-22 MED ORDER — CIPROFLOXACIN IN D5W 400 MG/200ML IV SOLN
INTRAVENOUS | Status: DC | PRN
  Administered 2023-10-22: 400 mg via INTRAVENOUS

## 2023-10-22 MED ORDER — CELECOXIB 200 MG PO CAPS: 200.0000 mg | ORAL_CAPSULE | Freq: Two times a day (BID) | ORAL | 1 refills | Status: AC

## 2023-10-22 MED ORDER — ACETAMINOPHEN 500 MG PO TABS
1000.0000 mg | ORAL_TABLET | Freq: Once | ORAL | Status: AC
  Administered 2023-10-22: 1000 mg via ORAL
  Filled 2023-10-22: qty 2

## 2023-10-22 MED ORDER — NITROFURANTOIN MONOHYD MACRO 100 MG PO CAPS: 100.0000 mg | ORAL_CAPSULE | Freq: Two times a day (BID) | ORAL | 0 refills | Status: AC

## 2023-10-22 MED ORDER — FENTANYL CITRATE (PF) 100 MCG/2ML IJ SOLN
INTRAMUSCULAR | Status: AC
  Filled 2023-10-22: qty 2

## 2023-10-22 MED ORDER — EPHEDRINE SULFATE (PRESSORS) 50 MG/ML IJ SOLN
INTRAMUSCULAR | Status: DC | PRN
  Administered 2023-10-22 (×2): 10 mg via INTRAVENOUS

## 2023-10-22 MED ORDER — SODIUM CHLORIDE 0.9 % IR SOLN
Status: DC | PRN
  Administered 2023-10-22: 6000 mL via INTRAVESICAL

## 2023-10-22 MED ORDER — TRAMADOL HCL 50 MG PO TABS: 50.0000 mg | ORAL_TABLET | Freq: Four times a day (QID) | ORAL | 0 refills | Status: AC | PRN

## 2023-10-22 MED ORDER — ORAL CARE MOUTH RINSE: 15.0000 mL | Freq: Once | OROMUCOSAL | Status: AC

## 2023-10-22 MED ORDER — HYDROMORPHONE HCL 1 MG/ML IJ SOLN
INTRAMUSCULAR | Status: AC
  Filled 2023-10-22: qty 1

## 2023-10-22 MED ORDER — LACTATED RINGERS IV SOLN: INTRAVENOUS | Status: DC

## 2023-10-22 MED ORDER — CHLORHEXIDINE GLUCONATE 0.12 % MT SOLN
15.0000 mL | Freq: Once | OROMUCOSAL | Status: AC
  Administered 2023-10-22: 15 mL via OROMUCOSAL

## 2023-10-22 SURGICAL SUPPLY — 20 items
BAG COUNTER SPONGE SURGICOUNT (BAG) IMPLANT
BAG URO CATCHER STRL LF (MISCELLANEOUS) ×1 IMPLANT
BASKET ZERO TIP NITINOL 2.4FR (BASKET) IMPLANT
CATH URETERAL DUAL LUMEN 10F (MISCELLANEOUS) ×1 IMPLANT
CATH URETL OPEN END 6FR 70 (CATHETERS) IMPLANT
CLOTH BEACON ORANGE TIMEOUT ST (SAFETY) ×1 IMPLANT
GLOVE SS BIOGEL STRL SZ 7 (GLOVE) ×1 IMPLANT
GLOVE SURG LX STRL 7.5 STRW (GLOVE) ×1 IMPLANT
GOWN STRL REUS W/ TWL XL LVL3 (GOWN DISPOSABLE) ×1 IMPLANT
GUIDEWIRE STR DUAL SENSOR (WIRE) ×1 IMPLANT
GUIDEWIRE ZIPWRE .038 STRAIGHT (WIRE) ×1 IMPLANT
IV NS 1000ML BAXH (IV SOLUTION) ×1 IMPLANT
KIT TURNOVER KIT A (KITS) IMPLANT
MANIFOLD NEPTUNE II (INSTRUMENTS) ×1 IMPLANT
PACK CYSTO (CUSTOM PROCEDURE TRAY) ×1 IMPLANT
SHEATH NAVIGATOR HD 11/13X36 (SHEATH) IMPLANT
STENT URET 6FRX24 CONTOUR (STENTS) IMPLANT
TRACTIP FLEXIVA PULS ID 200XHI (Laser) IMPLANT
TUBING CONNECTING 10 (TUBING) ×1 IMPLANT
TUBING UROLOGY SET (TUBING) ×1 IMPLANT

## 2023-10-22 NOTE — Anesthesia Procedure Notes (Signed)
 Procedure Name: LMA Insertion Date/Time: 10/22/2023 3:21 PM  Performed by: Inez Manger, CRNAPre-anesthesia Checklist: Patient identified, Emergency Drugs available, Suction available, Patient being monitored and Timeout performed Patient Re-evaluated:Patient Re-evaluated prior to induction Oxygen Delivery Method: Circle system utilized Preoxygenation: Pre-oxygenation with 100% oxygen Induction Type: IV induction Ventilation: Mask ventilation without difficulty LMA: LMA inserted LMA Size: 5.0 Grade View: Grade I Number of attempts: 1 Placement Confirmation: positive ETCO2 and breath sounds checked- equal and bilateral ETT to lip (cm): yes. Tube secured with: Tape Dental Injury: Teeth and Oropharynx as per pre-operative assessment  Comments: Smooth atraumatic dentition unchanged

## 2023-10-22 NOTE — Op Note (Signed)
 Operative Note  Preoperative diagnosis:  1.  Left ureteral stone 2. Left hydronephrosis  Postoperative diagnosis: 1.  same  Procedure(s): 1.  Left ureteroscopy with laser lithotripsy and dusting of stones retrograde pyelogram 2. Left ureteral stent placement 3. Left retrograde pyelogram  Surgeon: Julene Oaks, MD  Assistants:  None  Anesthesia:  General  Complications:  None  EBL:  Minimal  Specimens: 1. None   Drains/Catheters: 1.  Left 6Fr x 24cm ureteral stent with tether string  Intraoperative findings:   Cystoscopy demonstrated unremarkable bladder Left Ureteroscopy demonstrated soft stones in kidney. 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  Description of procedure: After informed consent was obtained from the patient, the patient was identified and taken to the operating room and placed in the supine position.  General anesthesia was administered as well as perioperative IV antibiotics.  At the beginning of the case, a time-out was performed to properly identify the patient, the surgery to be performed, and the surgical site.  Sequential compression devices were applied to the lower extremities at the beginning of the case for DVT prophylaxis.  The patient was then placed in the dorsal lithotomy supine position, prepped and draped in sterile fashion.  We then passed the 21-French rigid cystoscope through the urethra and into the bladder under vision without any difficulty, noting a normal urethra without strictures.  A systematic evaluation of the bladder revealed no evidence of any suspicious bladder lesions.  Ureteral orifices were in normal position.    we then passed a 0.038 glide wire up to the level of the renal pelvis.  The cystoscope was withdrawn, and a dual lumen catheter was inserted over the glide wire into the distal ureter. A gentle retrograde pyelogram was performed;There was hydronephrosis  of the collecting system. There were no filling defects. A 0.038 sensor wire was then passed up to the level of the renal pelvis and secured to the drape as a safety wire. The dual lumen was removed.   The flexible ureteroscope was advanced into the collecting system via the sensor wire. In the posterior portion of the collecting system, just proximal to the UPJ, there appeared to be a remnant of an imbedded stone. This was not obstructing, so I did not attempt to remove it today. The collecting system was inspected. The calculus was identified in the collecting system. Using the 242 micron holmium laser fiber, the stone was dusted completely. With the ureteroscope in the kidney, a gentle pyelogram was performed to delineate the calyceal system and we evaluated the calyces systematically. We encountered a a small stone in the upper pole and lower pole which were dusted. The rest of the stone fragments were very tiny and these were  irrigated away gently. The calyces were re-inspected and there were no significant stone fragment residual.   We then withdrew the ureteroscope back down the ureter along with the access sheath, noting no evidence of any stones along the course of the ureter.  Prior to removing the ureteroscope, we did pass the Glidewire back up to the ureter to the renal pelvis.  Once the ureteroscope was removed, we then used the Glidewire under fluoroscopic guidance and passed up a 6-French x 24 cm double-pigtail ureteral stent up the ureter, making sure that the proximal and distal ends coiled within the kidney and bladder respectively.  Note that we left a tether string attached to the distal end of the ureteral stent and it exited the  urethral meatus with a short tether.  The cystoscope was then advanced back into the bladder under vision.  We were able to see the distal stent coiling nicely within the bladder.  The bladder was then emptied with irrigation solution.  The cystoscope was then  removed.    The patient tolerated the procedure well and there was no complication. Patient was awoken from anesthesia and taken to the recovery room in stable condition. I was present and scrubbed for the entirety of the case.  Plan:  Patient will be discharged home and may remove stent on Tuesday   Mildred All MD [facility]  Pager: [phone]

## 2023-10-22 NOTE — Anesthesia Preprocedure Evaluation (Addendum)
 Anesthesia Evaluation  Patient identified by MRN, date of birth, ID band Patient awake    Reviewed: Allergy  & Precautions, NPO status , Patient's Chart, lab work & pertinent test results  History of Anesthesia Complications Negative for: history of anesthetic complications  Airway Mallampati: I  TM Distance: >3 FB Neck ROM: Full    Dental  (+) Dental Advisory Given   Pulmonary former smoker   breath sounds clear to auscultation       Cardiovascular hypertension, Pt. on medications (-) angina  Rhythm:Regular Rate:Normal     Neuro/Psych negative neurological ROS     GI/Hepatic Neg liver ROS,GERD  Controlled,,  Endo/Other    Class 4 obesityBMI 44  Renal/GU stones     Musculoskeletal   Abdominal   Peds  Hematology Hb 15.2, plt 223k   Anesthesia Other Findings   Reproductive/Obstetrics                             Anesthesia Physical Anesthesia Plan  ASA: 3  Anesthesia Plan: General   Post-op Pain Management: Tylenol  PO (pre-op)* and Minimal or no pain anticipated   Induction: Intravenous  PONV Risk Score and Plan: 3 and Ondansetron , Dexamethasone  and Scopolamine patch - Pre-op  Airway Management Planned: LMA  Additional Equipment: None  Intra-op Plan:   Post-operative Plan:   Informed Consent: I have reviewed the patients History and Physical, chart, labs and discussed the procedure including the risks, benefits and alternatives for the proposed anesthesia with the patient or authorized representative who has indicated his/her understanding and acceptance.     Dental advisory given  Plan Discussed with: CRNA and Surgeon  Anesthesia Plan Comments:         Anesthesia Quick Evaluation

## 2023-10-22 NOTE — H&P (Signed)
 H&P  History of Present Illness: Michele Hall is a 59 y.o. year old F who presents today for treatment of a symptomatic ureteral stone on the left  No acute complaints  Past Medical History:  Diagnosis Date   Anal fissure - posterior 2015   Complication of anesthesia    Pt states she heard her "oxygen dropped during procedure"   Gallstones    GERD (gastroesophageal reflux disease)    History of kidney stones    Hypertension    Morbid obesity (HCC)     Past Surgical History:  Procedure Laterality Date   CHOLECYSTECTOMY  1988   CYSTOSCOPY W/ URETERAL STENT PLACEMENT Left 04/06/2023   Procedure: CYSTOSCOPY WITH RETROGRADE PYELOGRAM/URETERAL STENT PLACEMENT;  Surgeon: Samson Croak, MD;  Location: [facility];  Service: Urology;  Laterality: Left;   CYSTOSCOPY WITH RETROGRADE PYELOGRAM, URETEROSCOPY AND STENT PLACEMENT Left 04/18/2020   Procedure: CYSTOSCOPY WITH RETROGRADE PYELOGRAM, URETEROSCOPY , STONE EXTRACTION AND STENT PLACEMENT;  Surgeon: Trent Frizzle, MD;  Location: [facility];  Service: Urology;  Laterality: Left;  75 MINS   CYSTOSCOPY WITH RETROGRADE PYELOGRAM, URETEROSCOPY AND STENT PLACEMENT Bilateral 01/29/2022   Procedure: CYSTOSCOPY WITH RETROGRADE PYELOGRAM, URETEROSCOPY, STONE EXTRACTION  AND STENT PLACEMENT holmium laser;  Surgeon: Trent Frizzle, MD;  Location: [facility];  Service: Urology;  Laterality: Bilateral;   CYSTOSCOPY WITH STENT PLACEMENT Left 01/22/2022   Procedure: CYSTOSCOPY WITH STENT PLACEMENT;  Surgeon: Sherlyn Ditto, MD;  Location: [facility];  Service: Urology;  Laterality: Left;   CYSTOSCOPY WITH URETEROSCOPY, STONE BASKETRY AND STENT PLACEMENT Left 10/14/2022   Procedure: CYSTOSCOPY WITH LEFT URETEROSCOPY, STONE BASKETRY AND LEFTSTENT PLACEMENT,RETROGRADE;  Surgeon: Mellie Sprinkle., MD;  Location: [facility];  Service: Urology;  Laterality: Left;   CYSTOSCOPY/URETEROSCOPY/HOLMIUM LASER/STENT PLACEMENT Left 07/24/2021   Procedure:  CYSTOSCOPY/URETEROSCOPY/HOLMIUM LASER/STENT PLACEMENT;  Surgeon: Trent Frizzle, MD;  Location: [facility];  Service: Urology;  Laterality: Left;   DILITATION & CURRETTAGE/HYSTROSCOPY WITH HYDROTHERMAL ABLATION N/A 09/13/2012   Procedure: DILATATION & CURETTAGE/HYSTEROSCOPY WITH HYDROTHERMAL ABLATION;  Surgeon: Julianne Octave, MD;  Location: [facility];  Service: Gynecology;  Laterality: N/A;   HOLMIUM LASER APPLICATION Left 04/18/2020   Procedure: HOLMIUM LASER LITHOTRIPSY;  Surgeon: Trent Frizzle, MD;  Location: [facility];  Service: Urology;  Laterality: Left;   HOLMIUM LASER APPLICATION Right 07/24/2021   Procedure: HOLMIUM LASER APPLICATION;  Surgeon: Trent Frizzle, MD;  Location: [facility];  Service: Urology;  Laterality: Right;   HOLMIUM LASER APPLICATION Bilateral 01/29/2022   Procedure: HOLMIUM LASER APPLICATION;  Surgeon: Trent Frizzle, MD;  Location: [facility];  Service: Urology;  Laterality: Bilateral;   HOLMIUM LASER APPLICATION  10/14/2022   Procedure: HOLMIUM LASER APPLICATION;  Surgeon: Mellie Sprinkle., MD;  Location: [facility];  Service: Urology;;   IR URETERAL STENT RIGHT NEW ACCESS W/O SEP NEPHROSTOMY CATH  07/24/2021   IR US  GUIDANCE  07/24/2021   NEPHROLITHOTOMY Right 07/24/2021   Procedure: NEPHROLITHOTOMY PERCUTANEOUS;  Surgeon: Trent Frizzle, MD;  Location: [facility];  Service: Urology;  Laterality: Right;   TUBAL LIGATION  1990    Home Medications:  Current Meds  Medication Sig   oseltamivir  (TAMIFLU ) 75 MG capsule Take 1 capsule (75 mg total) by mouth daily.    Allergies:  Allergies  Allergen Reactions   Fentanyl  Other (See Comments)    "Felt like I was going to have a heart attack"    Requip  [Ropinirole  Hcl] Other (See Comments)    Headache Feels "sick"    Family  History  Problem Relation Age of Onset   Stroke Mother    Kidney disease Father    Coronary artery disease Father    Aneurysm Father        AAA   Hypertension Father    Seizures Sister     Heart attack Maternal Grandfather    Cancer Sister        ovarian/uterus   Tongue cancer Sister    Colon cancer Neg Hx    Stomach cancer Neg Hx    Urticaria Neg Hx    Immunodeficiency Neg Hx    Eczema Neg Hx    Atopy Neg Hx    Asthma Neg Hx    Angioedema Neg Hx    Allergic rhinitis Neg Hx     Social History:  reports that she quit smoking about 21 years ago. Her smoking use included cigarettes. She started smoking about 26 years ago. She has a 1.3 pack-year smoking history. She has never used smokeless tobacco. She reports that she does not drink alcohol and does not use drugs.  ROS: A complete review of systems was performed.  All systems are negative except for pertinent findings as noted.  Physical Exam:  Vital signs in last 24 hours: Temp:  [98.2 F (36.8 C)] 98.2 F (36.8 C) (05/16 1233) Pulse Rate:  [111] 111 (05/16 1233) Resp:  [17] 17 (05/16 1233) BP: (141-158)/(87-117) 141/87 (05/16 1237) SpO2:  [95 %] 95 % (05/16 1233) Weight:  [124.8 kg] 124.8 kg (05/16 1233) Constitutional:  Alert and oriented, No acute distress Cardiovascular: Regular rate and rhythm Respiratory: Normal respiratory effort, Lungs clear bilaterally GI: Abdomen is soft, nontender, nondistended, no abdominal masses Lymphatic: No lymphadenopathy Neurologic: Grossly intact, no focal deficits Psychiatric: Normal mood and affect   Laboratory Data:  Recent Labs    10/22/23 1313  WBC 9.0  HGB 15.2*  HCT 43.2  PLT 223    Recent Labs    10/22/23 1313  NA 137  K 3.3*  CL 103  GLUCOSE 92  BUN 13  CALCIUM 8.3*  CREATININE 0.80     Results for orders placed or performed during the hospital encounter of 10/22/23 (from the past 24 hours)  Basic metabolic panel per protocol     Status: Abnormal   Collection Time: 10/22/23  1:13 PM  Result Value Ref Range   Sodium 137 135 - 145 mmol/L   Potassium 3.3 (L) 3.5 - 5.1 mmol/L   Chloride 103 98 - 111 mmol/L   CO2 22 22 - 32 mmol/L    Glucose, Bld 92 70 - 99 mg/dL   BUN 13 6 - 20 mg/dL   Creatinine, Ser 0.86 0.44 - 1.00 mg/dL   Calcium 8.3 (L) 8.9 - 10.3 mg/dL   GFR, Estimated >57 >84 mL/min   Anion gap 12 5 - 15  CBC per protocol     Status: Abnormal   Collection Time: 10/22/23  1:13 PM  Result Value Ref Range   WBC 9.0 4.0 - 10.5 K/uL   RBC 4.58 3.87 - 5.11 MIL/uL   Hemoglobin 15.2 (H) 12.0 - 15.0 g/dL   HCT 69.6 29.5 - 28.4 %   MCV 94.3 80.0 - 100.0 fL   MCH 33.2 26.0 - 34.0 pg   MCHC 35.2 30.0 - 36.0 g/dL   RDW 13.2 44.0 - 10.2 %   Platelets 223 150 - 400 K/uL   nRBC 0.0 0.0 - 0.2 %   No results found for this or any  previous visit (from the past 240 hours).  Renal Function: Recent Labs    10/22/23 1313  CREATININE 0.80   Estimated Creatinine Clearance: 106.5 mL/min (by C-G formula based on SCr of 0.8 mg/dL).  Radiologic Imaging: No results found.  Assessment:  Michele Hall is a 58 y.o. year old F with symptomatic left ureteral stone   Plan:  --to OR as planned for left ureteroscopy with laser litho, stent. Procedure and risks reviewed, including but not limited to hematuria, infection, sepsis, damage to GU tract, failure to complete procedure, retained stone fragments, need for future procedures, stent pain, prolonged stent.   Julene Oaks, MD 10/22/2023, 2:43 PM  [facility] Pager: [phone]

## 2023-10-22 NOTE — Transfer of Care (Signed)
 Immediate Anesthesia Transfer of Care Note  Patient: Michele Hall  Procedure(s) Performed: CYSTOSCOPY/URETEROSCOPY/HOLMIUM LASER/STENT PLACEMENT (Left)  Patient Location: PACU  Anesthesia Type:General  Level of Consciousness: drowsy  Airway & Oxygen Therapy: Patient Spontanous Breathing and Patient connected to face mask oxygen  Post-op Assessment: Report given to RN and Post -op Vital signs reviewed and stable  Post vital signs: Reviewed and stable  Last Vitals:  Vitals Value Taken Time  BP 115/67 10/22/23 1615  Temp    Pulse 83 10/22/23 1615  Resp 18 10/22/23 1615  SpO2 99 % 10/22/23 1615  Vitals shown include unfiled device data.  Last Pain:  Vitals:   10/22/23 1233  TempSrc: Oral  PainSc: 0-No pain         Complications: No notable events documented.

## 2023-10-22 NOTE — Discharge Instructions (Addendum)
 [facility] [phone] Post Ureteroscopy With or Without Stent Instructions\ Ok to remove stent Tuesday by pulling on string  Definitions:  Ureter: The duct that transports urine from the kidney to the bladder. Stent:   A plastic hollow tube that is placed into the ureter, from the kidney to the bladder to prevent the ureter from swelling shut.  GENERAL INSTRUCTIONS:  Despite the fact that no skin incisions were used, the area around the ureter and bladder is raw and irritated. The stent is a foreign body which will further irritate the bladder wall. This irritation is manifested by increased frequency of urination, both day and night, and by an increase in the urge to urinate. In some, the urge to urinate is present almost always. Sometimes the urge is strong enough that you may not be able to stop yourself from urinating. The only real cure is to remove the stent and then give time for the bladder wall to heal which can't be done until the danger of the ureter swelling shut has passed, which varies.  You may see some blood in your urine while the stent is in place and a few days afterwards. Do not be alarmed, even if the urine was clear for a while. Get off your feet and drink lots of fluids until clearing occurs. If you start to pass clots or don't improve, call us .  DIET: You may return to your normal diet immediately. Because of the raw surface of your bladder, alcohol, spicy foods, acid type foods and drinks with caffeine may cause irritation or frequency and should be used in moderation. To keep your urine flowing freely and to avoid constipation, drink plenty of fluids during the day ( 8-10 glasses ). Tip: Avoid cranberry juice because it is very acidic.  ACTIVITY: Your physical activity doesn't need to be restricted. However, if you are very active, you may see some blood in your urine. We suggest that you reduce your activity under these circumstances until the  bleeding has stopped.  BOWELS: It is important to keep your bowels regular during the postoperative period. Straining with bowel movements can cause bleeding. A bowel movement every other day is reasonable. Use a mild laxative if needed, such as Milk of Magnesia 2-3 tablespoons, or 2 Dulcolax tablets. Call if you continue to have problems. If you have been taking narcotics for pain, before, during or after your surgery, you may be constipated. Take a laxative if necessary.   MEDICATION: You should resume your pre-surgery medications unless told not to. In addition you will often be given an antibiotic to prevent infection and likely several as needed medications for stent related discomfort. These should be taken as prescribed until the bottles are finished unless you are having an unusual reaction to one of the drugs.  PROBLEMS YOU SHOULD REPORT TO US : Fevers over 100.5 Fahrenheit. Heavy bleeding, or clots ( See above notes about blood in urine ). Inability to urinate. Drug reactions ( hives, rash, nausea, vomiting, diarrhea ). Severe burning or pain with urination that is not improving.

## 2023-10-24 NOTE — Anesthesia Postprocedure Evaluation (Signed)
 Anesthesia Post Note  Patient: Michele Hall  Procedure(s) Performed: CYSTOSCOPY/URETEROSCOPY/HOLMIUM LASER/STENT PLACEMENT (Left)     Patient location during evaluation: PACU Anesthesia Type: General Level of consciousness: awake Pain management: pain level controlled Vital Signs Assessment: post-procedure vital signs reviewed and stable Respiratory status: spontaneous breathing, nonlabored ventilation and respiratory function stable Cardiovascular status: blood pressure returned to baseline and stable Postop Assessment: no apparent nausea or vomiting Anesthetic complications: no   No notable events documented.  Last Vitals:  Vitals:   10/22/23 1715 10/22/23 1730  BP: 118/81 116/78  Pulse: 85 78  Resp: 18 15  Temp:    SpO2: 95% 94%    Last Pain:  Vitals:   10/22/23 1730  TempSrc:   PainSc: 3                  Kashonda Sarkisyan P Jamyra Zweig

## 2023-10-25 ENCOUNTER — Encounter (HOSPITAL_COMMUNITY): Payer: Self-pay | Admitting: Urology

## 2023-11-05 ENCOUNTER — Ambulatory Visit: Admitting: Family Medicine

## 2023-11-05 ENCOUNTER — Encounter: Payer: Self-pay | Admitting: Family Medicine

## 2023-11-05 VITALS — BP 116/90 | HR 110 | Temp 98.0°F | Ht 66.25 in | Wt 274.2 lb

## 2023-11-05 DIAGNOSIS — M7062 Trochanteric bursitis, left hip: Secondary | ICD-10-CM

## 2023-11-05 DIAGNOSIS — M7061 Trochanteric bursitis, right hip: Secondary | ICD-10-CM

## 2023-11-05 MED ORDER — PREDNISONE 20 MG PO TABS: ORAL_TABLET | ORAL | 0 refills | Status: DC

## 2023-11-05 NOTE — Patient Instructions (Signed)
 Ice, gentle exercises, complete prednisone  taper.  After completed prednisone  taper, can use celebrex  200 mg daily temporarily.

## 2023-11-05 NOTE — Progress Notes (Signed)
 Patient ID: Michele Hall, female    DOB: 1965-08-02, 58 y.o.   MRN: [id number]  This visit was conducted in person.  BP (!) 116/90   Pulse (!) 110   Temp 98 F (36.7 C) (Temporal)   Ht 5' 6.25" (1.683 m)   Wt 274 lb 4 oz (124.4 kg)   SpO2 96%   BMI 43.93 kg/m    CC:  Chief Complaint  Patient presents with   Hip Pain    RIght-Been hurting since surgery 3 weeks ago   Groin Pain    Subjective:   HPI: Michele Hall is a 58 y.o. female presenting on 11/05/2023 for Hip Pain (RIght-Been hurting since surgery 3 weeks ago) and Groin Pain    New onset right hip and groin pain  Recent surgery 3 weeks ago for kidney stone... was in awkward position during procedure.  Pain when awoke from surgery.  Pain anterior and lateral....  Pain keeping her up at night.  Cannot cross legs.   No recent fall, no fever.  No numbness, no weakness.  She has tried  tylenol  for pain.  Has not been taking and NSAID.      Relevant past medical, surgical, family and social history reviewed and updated as indicated. Interim medical history since our last visit reviewed. Allergies and medications reviewed and updated. Outpatient Medications Prior to Visit  Medication Sig Dispense Refill   amLODipine  (NORVASC ) 5 MG tablet Take 1 tablet (5 mg total) by mouth daily. (Patient not taking: Reported on 11/05/2023) 30 tablet 11   celecoxib  (CELEBREX ) 200 MG capsule Take 1 capsule (200 mg total) by mouth 2 (two) times daily for 28 days. (Patient not taking: Reported on 11/05/2023) 28 capsule 1   hyoscyamine  (LEVSIN/SL) 0.125 MG SL tablet Place 1 tablet (0.125 mg total) under the tongue every 4 (four) hours as needed. (Patient not taking: Reported on 11/05/2023) 30 tablet 0   spironolactone  (ALDACTONE ) 50 MG tablet Take 1 tablet (50 mg total) by mouth daily. (Patient not taking: Reported on 11/05/2023) 90 tablet 3   neomycin -polymyxin-hydrocortisone (CORTISPORIN) OTIC solution Place 3 drops into the right ear 4  (four) times daily. (Patient not taking: Reported on 11/05/2023) 10 mL 0   oseltamivir  (TAMIFLU ) 75 MG capsule Take 1 capsule (75 mg total) by mouth daily. (Patient not taking: Reported on 11/05/2023) 10 capsule 0   No facility-administered medications prior to visit.     Per HPI unless specifically indicated in ROS section below Review of Systems  Constitutional:  Negative for fatigue and fever.  HENT:  Negative for congestion.   Eyes:  Negative for pain.  Respiratory:  Negative for cough and shortness of breath.   Cardiovascular:  Negative for chest pain, palpitations and leg swelling.  Gastrointestinal:  Negative for abdominal pain.  Genitourinary:  Negative for dysuria and vaginal bleeding.  Musculoskeletal:  Positive for arthralgias. Negative for back pain.  Neurological:  Negative for syncope, light-headedness and headaches.  Psychiatric/Behavioral:  Negative for dysphoric mood.    Objective:  BP (!) 116/90   Pulse (!) 110   Temp 98 F (36.7 C) (Temporal)   Ht 5' 6.25" (1.683 m)   Wt 274 lb 4 oz (124.4 kg)   SpO2 96%   BMI 43.93 kg/m   Wt Readings from Last 3 Encounters:  11/05/23 274 lb 4 oz (124.4 kg)  10/22/23 275 lb 2.2 oz (124.8 kg)  06/25/23 275 lb 2 oz (124.8 kg)  Physical Exam Constitutional:      General: She is not in acute distress.    Appearance: Normal appearance. She is well-developed. She is not ill-appearing or toxic-appearing.  HENT:     Head: Normocephalic.     Right Ear: Hearing, tympanic membrane, ear canal and external ear normal. Tympanic membrane is not erythematous, retracted or bulging.     Left Ear: Hearing, tympanic membrane, ear canal and external ear normal. Tympanic membrane is not erythematous, retracted or bulging.     Nose: No mucosal edema or rhinorrhea.     Right Sinus: No maxillary sinus tenderness or frontal sinus tenderness.     Left Sinus: No maxillary sinus tenderness or frontal sinus tenderness.     Mouth/Throat:      Pharynx: Uvula midline.  Eyes:     General: Lids are normal. Lids are everted, no foreign bodies appreciated.     Conjunctiva/sclera: Conjunctivae normal.     Pupils: Pupils are equal, round, and reactive to light.  Neck:     Thyroid : No thyroid  mass or thyromegaly.     Vascular: No carotid bruit.     Trachea: Trachea normal.  Cardiovascular:     Rate and Rhythm: Normal rate and regular rhythm.     Pulses: Normal pulses.     Heart sounds: Normal heart sounds, S1 normal and S2 normal. No murmur heard.    No friction rub. No gallop.  Pulmonary:     Effort: Pulmonary effort is normal. No tachypnea or respiratory distress.     Breath sounds: Normal breath sounds. No decreased breath sounds, wheezing, rhonchi or rales.  Abdominal:     General: Bowel sounds are normal.     Palpations: Abdomen is soft.     Tenderness: There is no abdominal tenderness.  Musculoskeletal:     Cervical back: Normal range of motion and neck supple.     Right hip: Tenderness present. No deformity or bony tenderness. Decreased range of motion.     Left hip: Tenderness present. No deformity or bony tenderness. Decreased range of motion.  Skin:    General: Skin is warm and dry.     Findings: No rash.  Neurological:     Mental Status: She is alert.  Psychiatric:        Mood and Affect: Mood is not anxious or depressed.        Speech: Speech normal.        Behavior: Behavior normal. Behavior is cooperative.        Thought Content: Thought content normal.        Judgment: Judgment normal.       Results for orders placed or performed during the hospital encounter of 10/22/23  Basic metabolic panel per protocol   Collection Time: 10/22/23  1:13 PM  Result Value Ref Range   Sodium 137 135 - 145 mmol/L   Potassium 3.3 (L) 3.5 - 5.1 mmol/L   Chloride 103 98 - 111 mmol/L   CO2 22 22 - 32 mmol/L   Glucose, Bld 92 70 - 99 mg/dL   BUN 13 6 - 20 mg/dL   Creatinine, Ser 6.96 0.44 - 1.00 mg/dL   Calcium 8.3 (L)  8.9 - 10.3 mg/dL   GFR, Estimated >29 >52 mL/min   Anion gap 12 5 - 15  CBC per protocol   Collection Time: 10/22/23  1:13 PM  Result Value Ref Range   WBC 9.0 4.0 - 10.5 K/uL   RBC 4.58 3.87 -  5.11 MIL/uL   Hemoglobin 15.2 (H) 12.0 - 15.0 g/dL   HCT 09.8 11.9 - 14.7 %   MCV 94.3 80.0 - 100.0 fL   MCH 33.2 26.0 - 34.0 pg   MCHC 35.2 30.0 - 36.0 g/dL   RDW 82.9 56.2 - 13.0 %   Platelets 223 150 - 400 K/uL   nRBC 0.0 0.0 - 0.2 %    Assessment and Plan  Greater trochanteric bursitis of both hips Assessment & Plan: Acute, most likely secondary to awkward positioning during procedure. Will treat with prednisone  taper.  No indication for x-ray as no fall.   Ice, gentle exercises, complete prednisone  taper.  After completed prednisone  taper, can use celebrex  200 mg daily temporarily.  Return and ER precautions provided.   Other orders -     predniSONE ; 3 tabs by mouth daily x 3 days, then 2 tabs by mouth daily x 2 days then 1 tab by mouth daily x 2 days  Dispense: 15 tablet; Refill: 0    No follow-ups on file.   Herby Lolling, MD

## 2023-11-11 DIAGNOSIS — M7061 Trochanteric bursitis, right hip: Secondary | ICD-10-CM | POA: Insufficient documentation

## 2023-11-11 NOTE — Assessment & Plan Note (Addendum)
 Acute, most likely secondary to awkward positioning during procedure. Will treat with prednisone  taper.  No indication for x-ray as no fall.   Ice, gentle exercises, complete prednisone  taper.  After completed prednisone  taper, can use celebrex  200 mg daily temporarily.  Return and ER precautions provided.

## 2024-01-19 ENCOUNTER — Telehealth: Payer: Self-pay | Admitting: *Deleted

## 2024-01-19 NOTE — Telephone Encounter (Signed)
 Copied from CRM #[id number]. Topic: General - Other >> Jan 19, 2024 11:30 AM Chiquita SQUIBB wrote: Reason for CRM: Patient is calling in requesting Amy's nurse is give her a call back regarding some dates that she needs.

## 2024-01-19 NOTE — Telephone Encounter (Signed)
 Spoke with Michele Hall. They are applying for life insurance and she  needs dates of last pap smear from 2023.  I advised her last pap was 01/06/2021 with no high risk HPV so she is not due for another pap until 01/15/2026  She also need date of her husbands PSA from 2023. Date provided.  Nothing further is needed at this time.

## 2024-01-20 ENCOUNTER — Encounter: Payer: Self-pay | Admitting: Family Medicine

## 2024-01-20 ENCOUNTER — Ambulatory Visit: Admitting: Family Medicine

## 2024-01-20 VITALS — BP 110/78 | HR 114 | Temp 98.2°F | Ht 66.25 in | Wt 277.0 lb

## 2024-01-20 DIAGNOSIS — L03213 Periorbital cellulitis: Secondary | ICD-10-CM

## 2024-01-20 MED ORDER — AMOXICILLIN-POT CLAVULANATE 875-125 MG PO TABS
1.0000 | ORAL_TABLET | Freq: Two times a day (BID) | ORAL | 0 refills | Status: DC
Start: 2024-01-20 — End: 2024-02-08

## 2024-01-20 MED ORDER — PREDNISONE 10 MG PO TABS: 10.0000 mg | ORAL_TABLET | Freq: Every day | ORAL | 0 refills | Status: DC

## 2024-01-20 NOTE — Progress Notes (Unsigned)
 Patient ID: Michele Hall, female    DOB: 1965-10-27, 58 y.o.   MRN: [id number]  This visit was conducted in person.  BP 110/78   Pulse (!) 114   Temp 98.2 F (36.8 C) (Temporal)   Ht 5' 6.25 (1.683 m)   Wt 277 lb (125.6 kg)   SpO2 97%   BMI 44.37 kg/m    CC:  Chief Complaint  Patient presents with  . Eye Issue    Wants Right  Eye looked at-been to eye doctor    Subjective:   HPI: Michele Hall is a 58 y.o. female presenting on 01/20/2024 for Eye Issue (Wants Right  Eye looked at-been to eye doctor)   She has noted  months  of recurrent styes on lower lid and irritation of upper eye lid  Occ itchy.  Watery eye.  Occ blurry vision when watery, no change in vision.  Went to eye MD... given allergy  eye drop  Dx with conjunctivitis.SABRA treated with antibiotics.. amoxicillin  10 days, repeated.... slight improvement in  styes, and skin irritation.      No new exposures. She is using antibacterial soap on face in last couples weeks.  Relevant past medical, surgical, family and social history reviewed and updated as indicated. Interim medical history since our last visit reviewed. Allergies and medications reviewed and updated. Outpatient Medications Prior to Visit  Medication Sig Dispense Refill  . spironolactone  (ALDACTONE ) 50 MG tablet Take 1 tablet (50 mg total) by mouth daily. 90 tablet 3  . amLODipine  (NORVASC ) 5 MG tablet Take 1 tablet (5 mg total) by mouth daily. (Patient not taking: Reported on 11/05/2023) 30 tablet 11  . hyoscyamine  (LEVSIN /SL) 0.125 MG SL tablet Place 1 tablet (0.125 mg total) under the tongue every 4 (four) hours as needed. (Patient not taking: Reported on 11/05/2023) 30 tablet 0  . predniSONE  (DELTASONE ) 20 MG tablet 3 tabs by mouth daily x 3 days, then 2 tabs by mouth daily x 2 days then 1 tab by mouth daily x 2 days 15 tablet 0   No facility-administered medications prior to visit.     Per HPI unless specifically indicated in ROS section  below Review of Systems Objective:  BP 110/78   Pulse (!) 114   Temp 98.2 F (36.8 C) (Temporal)   Ht 5' 6.25 (1.683 m)   Wt 277 lb (125.6 kg)   SpO2 97%   BMI 44.37 kg/m   Wt Readings from Last 3 Encounters:  01/20/24 277 lb (125.6 kg)  11/05/23 274 lb 4 oz (124.4 kg)  10/22/23 275 lb 2.2 oz (124.8 kg)      Physical Exam    Results for orders placed or performed during the hospital encounter of 10/22/23  Basic metabolic panel per protocol   Collection Time: 10/22/23  1:13 PM  Result Value Ref Range   Sodium 137 135 - 145 mmol/L   Potassium 3.3 (L) 3.5 - 5.1 mmol/L   Chloride 103 98 - 111 mmol/L   CO2 22 22 - 32 mmol/L   Glucose, Bld 92 70 - 99 mg/dL   BUN 13 6 - 20 mg/dL   Creatinine, Ser 9.19 0.44 - 1.00 mg/dL   Calcium 8.3 (L) 8.9 - 10.3 mg/dL   GFR, Estimated >39 >39 mL/min   Anion gap 12 5 - 15  CBC per protocol   Collection Time: 10/22/23  1:13 PM  Result Value Ref Range   WBC 9.0 4.0 - 10.5 K/uL  RBC 4.58 3.87 - 5.11 MIL/uL   Hemoglobin 15.2 (H) 12.0 - 15.0 g/dL   HCT 56.7 63.9 - 53.9 %   MCV 94.3 80.0 - 100.0 fL   MCH 33.2 26.0 - 34.0 pg   MCHC 35.2 30.0 - 36.0 g/dL   RDW 87.4 88.4 - 84.4 %   Platelets 223 150 - 400 K/uL   nRBC 0.0 0.0 - 0.2 %    Assessment and Plan  There are no diagnoses linked to this encounter.  No follow-ups on file.   Greig Ring, MD

## 2024-01-27 DIAGNOSIS — L03213 Periorbital cellulitis: Secondary | ICD-10-CM | POA: Insufficient documentation

## 2024-01-27 NOTE — Assessment & Plan Note (Signed)
 Acute, concern for possible periorbital cellulitis in setting of possible allergic conjunctivitis or bacterial conjunctivitis. Augmentin  1 tablet twice daily x 10 days.  To help with swelling and irritation will treat with prednisone  taper. She will follow-up with her eye doctor for full eye exam.  Return and ER precautions provided.

## 2024-02-03 ENCOUNTER — Ambulatory Visit: Admitting: Family Medicine

## 2024-02-08 ENCOUNTER — Encounter: Payer: Self-pay | Admitting: Family Medicine

## 2024-02-08 ENCOUNTER — Ambulatory Visit: Admitting: Family Medicine

## 2024-02-08 VITALS — BP 110/86 | HR 107 | Temp 97.8°F | Ht 66.25 in | Wt 277.2 lb

## 2024-02-08 DIAGNOSIS — H029 Unspecified disorder of eyelid: Secondary | ICD-10-CM | POA: Insufficient documentation

## 2024-02-08 DIAGNOSIS — H1011 Acute atopic conjunctivitis, right eye: Secondary | ICD-10-CM | POA: Insufficient documentation

## 2024-02-08 DIAGNOSIS — L03213 Periorbital cellulitis: Secondary | ICD-10-CM | POA: Diagnosis not present

## 2024-02-08 MED ORDER — AZELASTINE HCL 0.05 % OP SOLN: 1.0000 [drp] | Freq: Two times a day (BID) | OPHTHALMIC | 0 refills | Status: DC

## 2024-02-08 MED ORDER — TRIAMCINOLONE ACETONIDE 0.1 % EX CREA: 1.0000 | TOPICAL_CREAM | Freq: Two times a day (BID) | CUTANEOUS | 0 refills | Status: AC

## 2024-02-08 NOTE — Assessment & Plan Note (Signed)
 Acute, resolved.  Less redness and swelling around right eye status post Augmentin .

## 2024-02-08 NOTE — Assessment & Plan Note (Signed)
 Acute, continued eyelid changes on right eyelid, possible allergic or reactive changes to rubbing.  Will treat with topical triamcinolone  0.1% twice daily for 2 weeks Consider visit to dermatology if not improving with this treatment.

## 2024-02-08 NOTE — Assessment & Plan Note (Signed)
 Acute, patient reports watering of right eye only but I note redness in both conjunctiva. She has had no improvement with treatment with antibiotics both drops, ointment and oral. Will treat for possible allergic conjunctivitis with Optivar .

## 2024-02-08 NOTE — Progress Notes (Signed)
 Patient ID: Michele Hall, female    DOB: 1966/02/12, 58 y.o.   MRN: [id number]  This visit was conducted in person.  BP 110/86   Pulse (!) 107   Temp 97.8 F (36.6 C) (Temporal)   Ht 5' 6.25 (1.683 m)   Wt 277 lb 4 oz (125.8 kg)   SpO2 94%   BMI 44.41 kg/m    CC:  Chief Complaint  Patient presents with   Periorbital Cellulitis    Right Eye Follow Up    Subjective:   HPI: Michele Hall is a 58 y.o. female presenting on 02/08/2024 for Periorbital Cellulitis (Right Eye Follow Up)   Seen on August 14 with periorbital cellulitis of right eye.  Treated with  Augmentin  and prednisone  course. Today she reports  has had improvement in redness around eye.  Occ still feeling like something in in right eye, occ watery.  Still having small bumps on eyelid, not itchy sometime white heads appear and pop.  When it is not watering... vision normal... with watering it is blurry. Was having less watering when she was on prednisone .   No fever,  no face pain, mild ear pain.    Washing face with water and antibacterial soap.      Relevant past medical, surgical, family and social history reviewed and updated as indicated. Interim medical history since our last visit reviewed. Allergies and medications reviewed and updated. Outpatient Medications Prior to Visit  Medication Sig Dispense Refill   spironolactone  (ALDACTONE ) 50 MG tablet Take 1 tablet (50 mg total) by mouth daily. 90 tablet 3   amoxicillin -clavulanate (AUGMENTIN ) 875-125 MG tablet Take 1 tablet by mouth 2 (two) times daily. 20 tablet 0   predniSONE  (DELTASONE ) 10 MG tablet Take 1 tablet (10 mg total) by mouth daily with breakfast. 15 tablet 0   No facility-administered medications prior to visit.     Per HPI unless specifically indicated in ROS section below Review of Systems  Constitutional:  Negative for fatigue and fever.  HENT:  Negative for congestion.   Eyes:  Negative for pain.  Respiratory:  Negative for  cough and shortness of breath.   Cardiovascular:  Negative for chest pain, palpitations and leg swelling.  Gastrointestinal:  Negative for abdominal pain.  Genitourinary:  Negative for dysuria and vaginal bleeding.  Musculoskeletal:  Negative for back pain.  Neurological:  Negative for syncope, light-headedness and headaches.  Psychiatric/Behavioral:  Negative for dysphoric mood.    Objective:  BP 110/86   Pulse (!) 107   Temp 97.8 F (36.6 C) (Temporal)   Ht 5' 6.25 (1.683 m)   Wt 277 lb 4 oz (125.8 kg)   SpO2 94%   BMI 44.41 kg/m   Wt Readings from Last 3 Encounters:  02/08/24 277 lb 4 oz (125.8 kg)  01/20/24 277 lb (125.6 kg)  11/05/23 274 lb 4 oz (124.4 kg)      Physical Exam Constitutional:      General: She is not in acute distress.    Appearance: Normal appearance. She is well-developed. She is not ill-appearing or toxic-appearing.  HENT:     Head: Normocephalic.     Right Ear: Hearing, tympanic membrane, ear canal and external ear normal. Tympanic membrane is not erythematous, retracted or bulging.     Left Ear: Hearing, tympanic membrane, ear canal and external ear normal. Tympanic membrane is not erythematous, retracted or bulging.     Nose: No mucosal edema or rhinorrhea.  Right Sinus: No maxillary sinus tenderness or frontal sinus tenderness.     Left Sinus: No maxillary sinus tenderness or frontal sinus tenderness.     Mouth/Throat:     Pharynx: Uvula midline.  Eyes:     General: Lids are normal. Lids are everted, no foreign bodies appreciated. Vision grossly intact. Allergic shiner present.        Right eye: Discharge present.        Left eye: No discharge.     Extraocular Movements:     Right eye: Normal extraocular motion.     Left eye: Normal extraocular motion.     Conjunctiva/sclera:     Right eye: Right conjunctiva is injected. No exudate.    Left eye: Left conjunctiva is injected. No exudate.    Pupils: Pupils are equal, round, and reactive to  light.     Comments:  See photo  Neck:     Thyroid : No thyroid  mass or thyromegaly.     Vascular: No carotid bruit.     Trachea: Trachea normal.  Cardiovascular:     Rate and Rhythm: Normal rate and regular rhythm.     Pulses: Normal pulses.     Heart sounds: Normal heart sounds, S1 normal and S2 normal. No murmur heard.    No friction rub. No gallop.  Pulmonary:     Effort: Pulmonary effort is normal. No tachypnea or respiratory distress.     Breath sounds: Normal breath sounds. No decreased breath sounds, wheezing, rhonchi or rales.  Abdominal:     General: Bowel sounds are normal.     Palpations: Abdomen is soft.     Tenderness: There is no abdominal tenderness.  Musculoskeletal:     Cervical back: Normal range of motion and neck supple.  Skin:    General: Skin is warm and dry.     Findings: No rash.  Neurological:     Mental Status: She is alert.  Psychiatric:        Mood and Affect: Mood is not anxious or depressed.        Speech: Speech normal.        Behavior: Behavior normal. Behavior is cooperative.        Thought Content: Thought content normal.        Judgment: Judgment normal.       Results for orders placed or performed during the hospital encounter of 10/22/23  Basic metabolic panel per protocol   Collection Time: 10/22/23  1:13 PM  Result Value Ref Range   Sodium 137 135 - 145 mmol/L   Potassium 3.3 (L) 3.5 - 5.1 mmol/L   Chloride 103 98 - 111 mmol/L   CO2 22 22 - 32 mmol/L   Glucose, Bld 92 70 - 99 mg/dL   BUN 13 6 - 20 mg/dL   Creatinine, Ser 9.19 0.44 - 1.00 mg/dL   Calcium 8.3 (L) 8.9 - 10.3 mg/dL   GFR, Estimated >39 >39 mL/min   Anion gap 12 5 - 15  CBC per protocol   Collection Time: 10/22/23  1:13 PM  Result Value Ref Range   WBC 9.0 4.0 - 10.5 K/uL   RBC 4.58 3.87 - 5.11 MIL/uL   Hemoglobin 15.2 (H) 12.0 - 15.0 g/dL   HCT 56.7 63.9 - 53.9 %   MCV 94.3 80.0 - 100.0 fL   MCH 33.2 26.0 - 34.0 pg   MCHC 35.2 30.0 - 36.0 g/dL   RDW 87.4  88.4 - 84.4 %  Platelets 223 150 - 400 K/uL   nRBC 0.0 0.0 - 0.2 %    Assessment and Plan  Periorbital cellulitis of right eye Assessment & Plan: Acute, resolved.  Less redness and swelling around right eye status post Augmentin .   Allergic conjunctivitis of right eye Assessment & Plan: Acute, patient reports watering of right eye only but I note redness in both conjunctiva. She has had no improvement with treatment with antibiotics both drops, ointment and oral. Will treat for possible allergic conjunctivitis with Optivar .   Eyelid abnormality Assessment & Plan: Acute, continued eyelid changes on right eyelid, possible allergic or reactive changes to rubbing.  Will treat with topical triamcinolone  0.1% twice daily for 2 weeks Consider visit to dermatology if not improving with this treatment.   Other orders -     Azelastine  HCl; Place 1 drop into both eyes 2 (two) times daily.  Dispense: 6 mL; Refill: 0 -     Triamcinolone  Acetonide; Apply 1 Application topically 2 (two) times daily.  Dispense: 30 g; Refill: 0    No follow-ups on file.   Greig Ring, MD

## 2024-02-14 ENCOUNTER — Encounter: Payer: Self-pay | Admitting: Family Medicine

## 2024-02-15 ENCOUNTER — Other Ambulatory Visit: Payer: Self-pay | Admitting: Family Medicine

## 2024-02-15 MED ORDER — OLOPATADINE HCL 0.2 % OP SOLN: 1.0000 [drp] | Freq: Every day | OPHTHALMIC | 0 refills | Status: AC

## 2024-05-06 ENCOUNTER — Encounter: Payer: Self-pay | Admitting: Internal Medicine
